# Patient Record
Sex: Female | Born: 1940 | Race: White | Hispanic: No | Smoking: Former smoker
Health system: Southern US, Community
[De-identification: ages and names within clinical notes are randomized; demographics above are authoritative.]

## PROBLEM LIST (undated history)

## (undated) DIAGNOSIS — I509 Heart failure, unspecified: Secondary | ICD-10-CM

## (undated) DIAGNOSIS — J449 Chronic obstructive pulmonary disease, unspecified: Secondary | ICD-10-CM

## (undated) DIAGNOSIS — E7800 Pure hypercholesterolemia, unspecified: Secondary | ICD-10-CM

## (undated) DIAGNOSIS — Z9109 Other allergy status, other than to drugs and biological substances: Secondary | ICD-10-CM

## (undated) DIAGNOSIS — E119 Type 2 diabetes mellitus without complications: Secondary | ICD-10-CM

## (undated) DIAGNOSIS — I10 Essential (primary) hypertension: Secondary | ICD-10-CM

## (undated) HISTORY — DX: Type 2 diabetes mellitus without complications: E11.9

## (undated) HISTORY — DX: Heart failure, unspecified: I50.9

## (undated) HISTORY — PX: BREAST LUMPECTOMY: SHX2

## (undated) HISTORY — DX: Pure hypercholesterolemia, unspecified: E78.00

## (undated) HISTORY — DX: Essential (primary) hypertension: I10

## (undated) HISTORY — DX: Other allergy status, other than to drugs and biological substances: Z91.09

---

## 1973-12-31 HISTORY — PX: VESICOVAGINAL FISTULA CLOSURE W/ TAH: SUR271

## 2005-10-09 ENCOUNTER — Ambulatory Visit: Payer: Self-pay | Admitting: Urology

## 2005-10-30 ENCOUNTER — Ambulatory Visit: Payer: Self-pay | Admitting: Internal Medicine

## 2006-11-07 ENCOUNTER — Ambulatory Visit: Payer: Self-pay | Admitting: Internal Medicine

## 2006-11-11 ENCOUNTER — Ambulatory Visit: Payer: Self-pay | Admitting: *Deleted

## 2006-11-19 ENCOUNTER — Ambulatory Visit: Payer: Self-pay | Admitting: *Deleted

## 2007-06-24 ENCOUNTER — Ambulatory Visit: Payer: Self-pay | Admitting: Internal Medicine

## 2007-12-30 ENCOUNTER — Ambulatory Visit: Payer: Self-pay | Admitting: Internal Medicine

## 2009-01-10 ENCOUNTER — Ambulatory Visit: Payer: Self-pay | Admitting: Internal Medicine

## 2010-02-20 ENCOUNTER — Ambulatory Visit: Payer: Self-pay | Admitting: Internal Medicine

## 2011-03-29 ENCOUNTER — Ambulatory Visit: Payer: Self-pay | Admitting: Internal Medicine

## 2012-04-25 ENCOUNTER — Ambulatory Visit: Payer: Self-pay | Admitting: Internal Medicine

## 2012-06-18 ENCOUNTER — Ambulatory Visit: Payer: Self-pay | Admitting: Ophthalmology

## 2012-07-02 ENCOUNTER — Ambulatory Visit: Payer: Self-pay | Admitting: Ophthalmology

## 2012-07-16 ENCOUNTER — Ambulatory Visit: Payer: Self-pay | Admitting: Ophthalmology

## 2013-04-27 ENCOUNTER — Ambulatory Visit: Payer: Self-pay | Admitting: Internal Medicine

## 2013-07-02 ENCOUNTER — Ambulatory Visit: Payer: Self-pay | Admitting: Medical

## 2013-07-02 LAB — URINALYSIS, COMPLETE
Bacteria: NEGATIVE
Blood: NEGATIVE
Glucose,UR: NEGATIVE mg/dL (ref 0–75)
Ketone: NEGATIVE
Leukocyte Esterase: NEGATIVE
Nitrite: NEGATIVE
Protein: NEGATIVE
Specific Gravity: 1.015 (ref 1.003–1.030)
Squamous Epithelial: NONE SEEN

## 2013-07-03 ENCOUNTER — Inpatient Hospital Stay: Payer: Self-pay | Admitting: Internal Medicine

## 2013-07-03 LAB — CBC WITH DIFFERENTIAL/PLATELET
Basophil #: 0.1 10*3/uL (ref 0.0–0.1)
Eosinophil %: 1.9 %
HGB: 15.6 g/dL (ref 12.0–16.0)
Lymphocyte #: 1.3 10*3/uL (ref 1.0–3.6)
Lymphocyte %: 8.9 %
Monocyte #: 1 x10 3/mm — ABNORMAL HIGH (ref 0.2–0.9)
Monocyte %: 7 %
Neutrophil %: 81.6 %
RBC: 5.27 10*6/uL — ABNORMAL HIGH (ref 3.80–5.20)
WBC: 14.1 10*3/uL — ABNORMAL HIGH (ref 3.6–11.0)

## 2013-07-03 LAB — COMPREHENSIVE METABOLIC PANEL
Alkaline Phosphatase: 110 U/L (ref 50–136)
Anion Gap: 5 — ABNORMAL LOW (ref 7–16)
Bilirubin,Total: 1.1 mg/dL — ABNORMAL HIGH (ref 0.2–1.0)
Chloride: 104 mmol/L (ref 98–107)
EGFR (African American): >60
EGFR (Non-African Amer.): >60
Glucose: 131 mg/dL — ABNORMAL HIGH (ref 65–99)
Potassium: 4 mmol/L (ref 3.5–5.1)
SGOT(AST): 15 U/L (ref 15–37)
SGPT (ALT): 22 U/L (ref 12–78)
Sodium: 138 mmol/L (ref 136–145)

## 2013-07-03 LAB — URINE CULTURE

## 2013-07-03 LAB — TROPONIN I: Troponin-I: <0.02 ng/mL

## 2013-07-04 LAB — BASIC METABOLIC PANEL
BUN: 14 mg/dL (ref 7–18)
Co2: 29 mmol/L (ref 21–32)
Creatinine: 0.87 mg/dL (ref 0.60–1.30)
EGFR (Non-African Amer.): >60
Glucose: 162 mg/dL — ABNORMAL HIGH (ref 65–99)
Potassium: 3.9 mmol/L (ref 3.5–5.1)

## 2013-07-04 LAB — CBC WITH DIFFERENTIAL/PLATELET
Basophil #: 0 10*3/uL (ref 0.0–0.1)
Basophil %: 0.1 %
HCT: 46.6 % (ref 35.0–47.0)
HGB: 15.6 g/dL (ref 12.0–16.0)
Lymphocyte #: 0.8 10*3/uL — ABNORMAL LOW (ref 1.0–3.6)
MCH: 30 pg (ref 26.0–34.0)
MCHC: 33.5 g/dL (ref 32.0–36.0)
MCV: 89 fL (ref 80–100)
Monocyte #: 0.1 x10 3/mm — ABNORMAL LOW (ref 0.2–0.9)
Monocyte %: 1.4 %
Neutrophil #: 9 10*3/uL — ABNORMAL HIGH (ref 1.4–6.5)
Platelet: 163 10*3/uL (ref 150–440)
RBC: 5.21 10*6/uL — ABNORMAL HIGH (ref 3.80–5.20)
RDW: 14.8 % — ABNORMAL HIGH (ref 11.5–14.5)
WBC: 9.9 10*3/uL (ref 3.6–11.0)

## 2013-07-05 LAB — CBC WITH DIFFERENTIAL/PLATELET
Basophil %: 0.2 %
Eosinophil #: 0 10*3/uL (ref 0.0–0.7)
Eosinophil %: 0 %
HGB: 15 g/dL (ref 12.0–16.0)
Lymphocyte #: 1.1 10*3/uL (ref 1.0–3.6)
Lymphocyte %: 4.7 %
MCH: 30.1 pg (ref 26.0–34.0)
MCV: 89 fL (ref 80–100)
Monocyte #: 0.9 x10 3/mm (ref 0.2–0.9)
Monocyte %: 4 %
Neutrophil #: 20.6 10*3/uL — ABNORMAL HIGH (ref 1.4–6.5)
Platelet: 167 10*3/uL (ref 150–440)
RBC: 4.99 10*6/uL (ref 3.80–5.20)
RDW: 15.1 % — ABNORMAL HIGH (ref 11.5–14.5)
WBC: 22.6 10*3/uL — ABNORMAL HIGH (ref 3.6–11.0)

## 2013-07-05 LAB — BASIC METABOLIC PANEL
Anion Gap: 6 — ABNORMAL LOW (ref 7–16)
BUN: 15 mg/dL (ref 7–18)
Calcium, Total: 9.7 mg/dL (ref 8.5–10.1)
EGFR (African American): >60
EGFR (Non-African Amer.): 58 — ABNORMAL LOW
Osmolality: 288 (ref 275–301)
Potassium: 4.2 mmol/L (ref 3.5–5.1)

## 2013-07-06 LAB — BASIC METABOLIC PANEL
Calcium, Total: 9.8 mg/dL (ref 8.5–10.1)
Chloride: 107 mmol/L (ref 98–107)
Co2: 33 mmol/L — ABNORMAL HIGH (ref 21–32)
Creatinine: 0.96 mg/dL (ref 0.60–1.30)
EGFR (African American): >60
Potassium: 4.2 mmol/L (ref 3.5–5.1)
Sodium: 143 mmol/L (ref 136–145)

## 2013-07-06 LAB — CBC WITH DIFFERENTIAL/PLATELET
Basophil #: 0 10*3/uL (ref 0.0–0.1)
Basophil %: 0.1 %
Eosinophil #: 0 10*3/uL (ref 0.0–0.7)
Eosinophil %: 0 %
HCT: 45.1 % (ref 35.0–47.0)
HGB: 15.1 g/dL (ref 12.0–16.0)
Lymphocyte #: 1.1 10*3/uL (ref 1.0–3.6)
Lymphocyte %: 6 %
Platelet: 166 10*3/uL (ref 150–440)
RBC: 5.04 10*6/uL (ref 3.80–5.20)
RDW: 15 % — ABNORMAL HIGH (ref 11.5–14.5)
WBC: 18.7 10*3/uL — ABNORMAL HIGH (ref 3.6–11.0)

## 2013-07-07 LAB — SEDIMENTATION RATE: Erythrocyte Sed Rate: 1 mm/hr (ref 0–30)

## 2013-07-08 LAB — CULTURE, BLOOD (SINGLE)

## 2013-07-16 LAB — PULMONARY FUNCTION TEST

## 2014-01-05 ENCOUNTER — Encounter: Payer: Self-pay | Admitting: Specialist

## 2014-01-31 ENCOUNTER — Encounter: Payer: Self-pay | Admitting: Specialist

## 2014-02-28 ENCOUNTER — Encounter: Payer: Self-pay | Admitting: Specialist

## 2014-03-24 ENCOUNTER — Encounter: Payer: Self-pay | Admitting: Pulmonary Disease

## 2014-03-24 ENCOUNTER — Ambulatory Visit (INDEPENDENT_AMBULATORY_CARE_PROVIDER_SITE_OTHER): Payer: Medicare Other | Admitting: Pulmonary Disease

## 2014-03-24 VITALS — BP 120/68 | HR 81 | Ht 64.0 in | Wt 172.0 lb

## 2014-03-24 DIAGNOSIS — J441 Chronic obstructive pulmonary disease with (acute) exacerbation: Secondary | ICD-10-CM

## 2014-03-24 DIAGNOSIS — J449 Chronic obstructive pulmonary disease, unspecified: Secondary | ICD-10-CM

## 2014-03-24 DIAGNOSIS — J9610 Chronic respiratory failure, unspecified whether with hypoxia or hypercapnia: Secondary | ICD-10-CM

## 2014-03-24 NOTE — Patient Instructions (Signed)
Try taking the Anoro once per day instead of your Spiriva and Advair; If you think it is better than your current medical regimen then call us so we can call in a prescription.  Otherwise resume the Spiriva and Advair after you complete the sample We will arrange a stress test at [facility] Continue pulmonary rehab We will see you back in 4-6 weeks or sooner if needed

## 2014-03-24 NOTE — Progress Notes (Signed)
Subjective:    Patient ID: [name], female    DOB: Oct 14, 1941, 73 y.o.   MRN: [id number]  HPI  CC "Make me breathe better!"  [name] is here to see me for advanced COPD.  Last July (2014) she was hospitalized for 6 days for acute respiratory failure and was discharged on oxygen.  She was told that she had stage IV COPD and has been compliant with office visits with Dr. Vella Kohler here in town.  She has been going to pulmonary rehab and has completed 20 of 36 rehab visits.   She really doesn't cough too often and she doesn't feel too much chest congestion.  She has been using her Spiriva and Advair regularly.  She has been on inhalers for about 5 years now.   Since her hospitalization she has been experiencing dyspnea with minimal activity.  She exercises regularly with pulmonary rehab, but she relies on her husband to do work around the house.  She still participates in some activities like cooking and minimal cleaning.  She doesn't have chest pain or leg swelling with exertion.   Past Medical History  Diagnosis Date  . High blood pressure   . High cholesterol   . Environmental allergies      Family History  Problem Relation Age of Onset  . Heart disease Father   . Heart disease Brother     2 brothers  . Rheum arthritis Mother   . Cancer Mother     breast  . Cancer Maternal Aunt     cervical  . Cancer Maternal Aunt     stomach     History   Social History  . Marital Status: Married    Spouse Name: N/A    Number of Children: N/A  . Years of Education: N/A   Occupational History  . Not on file.   Social History Main Topics  . Smoking status: Former Smoker -- 1.00 packs/day for 45 years    Types: Cigarettes    Quit date: 06/30/2013  . Smokeless tobacco: Never Used  . Alcohol Use: Yes     Comment: occasional glass of wine  . Drug Use: No  . Sexual Activity: Not on file   Other Topics Concern  . Not on file   Social History Narrative  . No narrative on  file     Allergies  Allergen Reactions  . Mucinex D [Pseudoephedrine-Guaifenesin Er]     Rash on neck     No outpatient prescriptions prior to visit.   No facility-administered medications prior to visit.       Review of Systems  Constitutional: Negative for fever and unexpected weight change.  HENT: Positive for sinus pressure. Negative for congestion, dental problem, ear pain, nosebleeds, postnasal drip, rhinorrhea, sneezing, sore throat and trouble swallowing.   Eyes: Negative for redness and itching.  Respiratory: Positive for chest tightness and shortness of breath. Negative for cough and wheezing.   Cardiovascular: Negative for palpitations and leg swelling.  Gastrointestinal: Negative for nausea and vomiting.  Genitourinary: Negative for dysuria.  Musculoskeletal: Negative for joint swelling.  Skin: Negative for rash.  Neurological: Negative for headaches.  Hematological: Does not bruise/bleed easily.  Psychiatric/Behavioral: Negative for dysphoric mood. The patient is not nervous/anxious.        Objective:   Physical Exam  Filed Vitals:   03/24/14 1610  BP: 120/68  Pulse: 81  Height: 5\' 4"  (1.626 m)  Weight: 172 lb (78.019 kg)  SpO2: 94%  2L [place]  Gen: well appearing, no acute distress HEENT: NCAT, PERRL, EOMi, OP clear, neck supple without masses PULM: CTA B CV: RRR, no mgr, no JVD AB: BS+, soft, nontender, no hsm Ext: warm, no edema, no clubbing, no cyanosis Derm: no rash or skin breakdown Neuro: A&Ox4, CN II-XII intact, strength 5/5 in all 4 extremities   06/2013 CT chest > upper lobe emphysema noted     Assessment & Plan:   COPD, severe Based on the fact that she was hospitalized in July for COPD she has GOLD Grade D COPD.  However, I haven't seen spirometry for her yet.  Her major limitation is shortness of breath and she has very little symptoms consistent with chronic bronchitis.  She has severe upper lobe emphysema on CT chest from July  2014.  Plan: -switch inhaler regimen to Anoro alone plus short acting for a trial -continue pulmonary rehab -if no improvement and depending on PFT results (I am requesting records), consider referral for Lung Volume Reduction Surgery  Chronic respiratory failure Continue 2L continuously    Updated Medication List Outpatient Encounter Prescriptions as of 03/24/2014  Medication Sig  . albuterol (PROVENTIL HFA;VENTOLIN HFA) 108 (90 BASE) MCG/ACT inhaler Inhale 2 puffs into the lungs every 6 (six) hours as needed for wheezing or shortness of breath.  Marland Kitchen atorvastatin (LIPITOR) 20 MG tablet Take 20 mg by mouth daily.  . carvedilol (COREG) 6.25 MG tablet Take 6.25 mg by mouth 2 (two) times daily with a meal.  . citalopram (CELEXA) 20 MG tablet Take 20 mg by mouth daily.  . Fluticasone-Salmeterol (ADVAIR) 250-50 MCG/DOSE AEPB Inhale 1 puff into the lungs 2 (two) times daily.  . potassium chloride (MICRO-K) 10 MEQ CR capsule Take 10 mEq by mouth daily.  . roflumilast (DALIRESP) 500 MCG TABS tablet Take 500 mcg by mouth daily.  Marland Kitchen tiotropium (SPIRIVA) 18 MCG inhalation capsule Place 18 mcg into inhaler and inhale daily.  Marland Kitchen torsemide (DEMADEX) 20 MG tablet Take 20 mg by mouth daily.

## 2014-03-25 DIAGNOSIS — J449 Chronic obstructive pulmonary disease, unspecified: Secondary | ICD-10-CM | POA: Insufficient documentation

## 2014-03-25 DIAGNOSIS — J9610 Chronic respiratory failure, unspecified whether with hypoxia or hypercapnia: Secondary | ICD-10-CM | POA: Insufficient documentation

## 2014-03-25 NOTE — Assessment & Plan Note (Signed)
Continue 2L continuously 

## 2014-03-25 NOTE — Assessment & Plan Note (Signed)
Based on the fact that she was hospitalized in July for COPD she has GOLD Grade D COPD.  However, I haven't seen spirometry for her yet.  Her major limitation is shortness of breath and she has very little symptoms consistent with chronic bronchitis.  She has severe upper lobe emphysema on CT chest from July 2014.  Plan: -switch inhaler regimen to Anoro alone plus short acting for a trial -continue pulmonary rehab -if no improvement and depending on PFT results (I am requesting records), consider referral for Lung Volume Reduction Surgery

## 2014-03-31 ENCOUNTER — Encounter: Payer: Self-pay | Admitting: Specialist

## 2014-04-05 ENCOUNTER — Other Ambulatory Visit: Payer: Self-pay

## 2014-04-05 ENCOUNTER — Telehealth: Payer: Self-pay | Admitting: Pulmonary Disease

## 2014-04-05 DIAGNOSIS — J9610 Chronic respiratory failure, unspecified whether with hypoxia or hypercapnia: Secondary | ICD-10-CM

## 2014-04-05 NOTE — Telephone Encounter (Signed)
Pamala Hurry from the pulmonary department called she needs to clarify the order on this patient. She also would like to ask a few more questions in regards to this patient.

## 2014-04-05 NOTE — Telephone Encounter (Signed)
ACT X3 line busy, will cb

## 2014-04-05 NOTE — Telephone Encounter (Signed)
Pamala Hurry w/ [facility] calling again.  Pt is there now - she is in a wheelchair with O2.  Pamala Hurry does not feel comfortable doing treadmill test.    Discussed with Caryl Pina, who asked BQ.  Per BQ: looking for cardiac ischemia; if pt is unable to perform treadmill test, can do drug-induced w/ Lexiscan.    Per Pamala Hurry, new order will need to be placed for [facility] and this is dosed by a pharmacy in [place] so the patient will need to be rescheduled.  Caryl Pina is aware and will change the order.  Pamala Hurry is asking that the patient be called this afternoon to discuss.  Will hold in triage to call pt this pm.

## 2014-04-05 NOTE — Telephone Encounter (Signed)
Spoke with the pt and notified of recs per BQ  She verbalized understanding  Nothing further needed 

## 2014-04-05 NOTE — Telephone Encounter (Signed)
Duplicate message See other 4.6.15 phone note

## 2014-04-21 ENCOUNTER — Ambulatory Visit: Payer: Self-pay | Admitting: Pulmonary Disease

## 2014-04-21 DIAGNOSIS — R0609 Other forms of dyspnea: Secondary | ICD-10-CM

## 2014-04-21 DIAGNOSIS — R0989 Other specified symptoms and signs involving the circulatory and respiratory systems: Secondary | ICD-10-CM

## 2014-04-26 ENCOUNTER — Encounter: Payer: Self-pay | Admitting: Pulmonary Disease

## 2014-04-26 ENCOUNTER — Telehealth: Payer: Self-pay

## 2014-04-26 DIAGNOSIS — R0600 Dyspnea, unspecified: Secondary | ICD-10-CM | POA: Insufficient documentation

## 2014-04-26 NOTE — Telephone Encounter (Signed)
Message copied by Len Blalock on Mon Apr 26, 2014 11:20 AM ------      Message from: Simonne Maffucci B      Created: Mon Apr 26, 2014  2:23 AM       A,            Please let her know that her nuclear stress test was normal            Thanks      B ------

## 2014-04-26 NOTE — Telephone Encounter (Signed)
Pt aware of results.  Nothing further needed.  

## 2014-04-26 NOTE — Telephone Encounter (Signed)
lmtcb X1 to relay results. 

## 2014-04-26 NOTE — Telephone Encounter (Signed)
Patient returning call.

## 2014-04-29 ENCOUNTER — Encounter: Payer: Self-pay | Admitting: Pulmonary Disease

## 2014-05-03 ENCOUNTER — Ambulatory Visit (INDEPENDENT_AMBULATORY_CARE_PROVIDER_SITE_OTHER): Payer: Medicare Other | Admitting: Pulmonary Disease

## 2014-05-03 ENCOUNTER — Encounter: Payer: Self-pay | Admitting: Pulmonary Disease

## 2014-05-03 VITALS — BP 118/64 | HR 71 | Ht 64.0 in | Wt 170.0 lb

## 2014-05-03 DIAGNOSIS — J449 Chronic obstructive pulmonary disease, unspecified: Secondary | ICD-10-CM

## 2014-05-03 DIAGNOSIS — J9610 Chronic respiratory failure, unspecified whether with hypoxia or hypercapnia: Secondary | ICD-10-CM

## 2014-05-03 NOTE — Progress Notes (Signed)
Subjective:    Patient ID: [name], female    DOB: 1941/02/17, 73 y.o.   MRN: [id number]  Synopsis:: Gold grade D. COPD on 2 L nasal cannula continuously. Completed pulmonary rehabilitation in April 2015  HPI  05/03/2014 ROV > since completing pulmonary rehabilitation Brecklynn has been feeling stronger but not necessarily any better in terms of shortness of breath. She continues to use her oxygen continuously and benefits from it. She tried taking the Anoro but said that it actually made her breathing worse so she switched back to the Advair and Spriva. She has not started exercising since completing pulmonary rehabilitation late last week. She does not have a cough. She did have a cold recently that had her coughing a little bit but this is resolved. She does not have chest pain. Last week she has stress test that was normal.   Past Medical History  Diagnosis Date  . High blood pressure   . High cholesterol   . Environmental allergies      Review of Systems     Objective:   Physical Exam Filed Vitals:   05/03/14 1433  BP: 118/64  Pulse: 71  Height: 5\' 4"  (1.626 m)  Weight: 170 lb (77.111 kg)  SpO2: 100%   2L [place]  Gen: well appearing, no acute distress HEENT: NCAT,  EOMi, OP clear, PULM: Crackles in bases, poor air movement CV: RRR, no mgr, no JVD AB: BS+, soft, nontender, no hsm Ext: warm, no edema, no clubbing, no cyanosis Derm: no rash or skin breakdown Neuro: A&Ox4, MAEW  July 2014 CT chest>  significant upper lobe emphysema, very mild interstitial thickening in the bases versus atelectasis, most predominant finding is emphysema overall   April 2015 Myoview stress test normal  2014 TTE > LVEF 60-65%, normal LV size and function, mild AR     Assessment & Plan:   COPD, severe She still has very significant dyspnea despite completing pulmonary rehabilitation, compliance with medicines, not smoking, and compliance with oxygen. She really has no other  concomitant problem such as heart failure or coronary artery disease. Her recent stress test was normal.  She may be a candidate for lung volume reduction surgery given the significant upper lobe emphysema.  Plan: -We will request records from her prior pulmonologist office again for pulmonary function testing -If we don't get them know this order another set of PFTs -I. will request a thoracic surgery evaluation at [facility] for lung volume reduction surgery if she meets criteria -In the meantime remain active -Continue medicines as written -Continue oxygen as written -Get a flu shot in the fall    Updated Medication List Outpatient Encounter Prescriptions as of 05/03/2014  Medication Sig  . albuterol (PROVENTIL HFA;VENTOLIN HFA) 108 (90 BASE) MCG/ACT inhaler Inhale 2 puffs into the lungs every 6 (six) hours as needed for wheezing or shortness of breath.  Marland Kitchen atorvastatin (LIPITOR) 20 MG tablet Take 20 mg by mouth daily.  . carvedilol (COREG) 6.25 MG tablet Take 6.25 mg by mouth 2 (two) times daily with a meal.  . citalopram (CELEXA) 20 MG tablet Take 20 mg by mouth daily.  . Fluticasone-Salmeterol (ADVAIR) 250-50 MCG/DOSE AEPB Inhale 1 puff into the lungs 2 (two) times daily.  . potassium chloride (MICRO-K) 10 MEQ CR capsule Take 10 mEq by mouth daily.  . roflumilast (DALIRESP) 500 MCG TABS tablet Take 500 mcg by mouth daily.  Marland Kitchen tiotropium (SPIRIVA) 18 MCG inhalation capsule Place 18 mcg into inhaler and inhale  daily.  . torsemide (DEMADEX) 20 MG tablet Take 20 mg by mouth daily.

## 2014-05-03 NOTE — Assessment & Plan Note (Signed)
Continue 2 L of oxygen continuously. 

## 2014-05-03 NOTE — Assessment & Plan Note (Signed)
She still has very significant dyspnea despite completing pulmonary rehabilitation, compliance with medicines, not smoking, and compliance with oxygen. She really has no other concomitant problem such as heart failure or coronary artery disease. Her recent stress test was normal.  She may be a candidate for lung volume reduction surgery given the significant upper lobe emphysema.  Plan: -We will request records from her prior pulmonologist office again for pulmonary function testing -If we don't get them know this order another set of PFTs -I. will request a thoracic surgery evaluation at [facility] for lung volume reduction surgery if she meets criteria -In the meantime remain active -Continue medicines as written -Continue oxygen as written -Get a flu shot in the fall

## 2014-05-03 NOTE — Patient Instructions (Signed)
We will call you when we get the results of the lung function testing to let you know if we should refer you to the thoracic surgery clinic at [facility] to consider lung volume reduction surgery Stay active We will see you back in 4-6 months or sooner if needed

## 2014-05-04 ENCOUNTER — Encounter: Payer: Self-pay | Admitting: Pulmonary Disease

## 2014-05-21 ENCOUNTER — Encounter: Payer: Self-pay | Admitting: Pulmonary Disease

## 2014-05-25 ENCOUNTER — Encounter: Payer: Self-pay | Admitting: Pulmonary Disease

## 2014-05-29 DIAGNOSIS — E785 Hyperlipidemia, unspecified: Secondary | ICD-10-CM

## 2014-05-29 DIAGNOSIS — R32 Unspecified urinary incontinence: Secondary | ICD-10-CM | POA: Insufficient documentation

## 2014-05-29 DIAGNOSIS — I10 Essential (primary) hypertension: Secondary | ICD-10-CM | POA: Insufficient documentation

## 2014-05-29 DIAGNOSIS — R609 Edema, unspecified: Secondary | ICD-10-CM | POA: Insufficient documentation

## 2014-05-29 DIAGNOSIS — E1169 Type 2 diabetes mellitus with other specified complication: Secondary | ICD-10-CM | POA: Insufficient documentation

## 2014-06-08 DIAGNOSIS — E1122 Type 2 diabetes mellitus with diabetic chronic kidney disease: Secondary | ICD-10-CM | POA: Insufficient documentation

## 2014-06-08 DIAGNOSIS — N183 Chronic kidney disease, stage 3 unspecified: Secondary | ICD-10-CM | POA: Insufficient documentation

## 2014-07-07 ENCOUNTER — Ambulatory Visit: Payer: Self-pay | Admitting: Internal Medicine

## 2014-08-02 ENCOUNTER — Telehealth: Payer: Self-pay | Admitting: Pulmonary Disease

## 2014-08-02 MED ORDER — FLUTICASONE-SALMETEROL 250-50 MCG/DOSE IN AEPB: 1.0000 | INHALATION_SPRAY | Freq: Two times a day (BID) | RESPIRATORY_TRACT | Status: DC

## 2014-08-02 MED ORDER — TIOTROPIUM BROMIDE MONOHYDRATE 18 MCG IN CAPS: 18.0000 ug | ORAL_CAPSULE | Freq: Every day | RESPIRATORY_TRACT | Status: DC

## 2014-08-02 NOTE — Telephone Encounter (Signed)
Pt aware rx's have been called in. Nothing further needed

## 2014-09-20 ENCOUNTER — Telehealth: Payer: Self-pay | Admitting: Pulmonary Disease

## 2014-09-20 NOTE — Telephone Encounter (Signed)
I spoke with the pt and she wanted to know what flu shot we have available. I advised it is not the high dose she was asking about. Pt states understanding. Nothing further needed. Major Bing, CMA

## 2014-10-10 DIAGNOSIS — F325 Major depressive disorder, single episode, in full remission: Secondary | ICD-10-CM | POA: Insufficient documentation

## 2014-10-13 ENCOUNTER — Encounter: Payer: Self-pay | Admitting: Pulmonary Disease

## 2014-10-13 ENCOUNTER — Ambulatory Visit (INDEPENDENT_AMBULATORY_CARE_PROVIDER_SITE_OTHER): Payer: Medicare Other | Admitting: Pulmonary Disease

## 2014-10-13 VITALS — BP 120/64 | HR 74 | Ht 64.0 in | Wt 176.0 lb

## 2014-10-13 DIAGNOSIS — J9611 Chronic respiratory failure with hypoxia: Secondary | ICD-10-CM

## 2014-10-13 DIAGNOSIS — Z23 Encounter for immunization: Secondary | ICD-10-CM

## 2014-10-13 DIAGNOSIS — R5383 Other fatigue: Secondary | ICD-10-CM | POA: Insufficient documentation

## 2014-10-13 DIAGNOSIS — J449 Chronic obstructive pulmonary disease, unspecified: Secondary | ICD-10-CM

## 2014-10-13 LAB — CBC
HEMATOCRIT: 34.8 % — AB (ref 36.0–46.0)
Hemoglobin: 11.3 g/dL — ABNORMAL LOW (ref 12.0–15.0)
MCHC: 32.6 g/dL (ref 30.0–36.0)
MCV: 90.3 fl (ref 78.0–100.0)
Platelets: 174 10*3/uL (ref 150.0–400.0)
RBC: 3.86 Mil/uL — AB (ref 3.87–5.11)
RDW: 13.9 % (ref 11.5–15.5)
WBC: 7.8 10*3/uL (ref 4.0–10.5)

## 2014-10-13 LAB — TSH: TSH: 3.77 u[IU]/mL (ref 0.35–4.50)

## 2014-10-13 NOTE — Assessment & Plan Note (Signed)
Continue 2 L of oxygen continuously. 

## 2014-10-13 NOTE — Assessment & Plan Note (Signed)
She has severe COPD but this has been a stable interval. Most of her complaints today revolve around fatigue and not frank shortness of breath.  Plan: -Flu shot -Continue Advair and Spiriva -Followup 4-6 months

## 2014-10-13 NOTE — Progress Notes (Signed)
Subjective:    Patient ID: [name], female    DOB: [birth year], 73 y.o.   MRN: [id number]  Synopsis:: Gold grade D. COPD on 2 L nasal cannula continuously. Completed pulmonary rehabilitation in April 2015  HPI  Chief Complaint  Patient presents with  . Follow-up    Pt c.o SOB with exertion, prod cough with white mucus in mornings. CAT score 24.   10/13/2014 ROV > [name] says she has been doing OK lately, but thinks that her dyspnea is worse after pulmonary rehab.  She has a lot of dyspnea with activities around the house like cooking.  She can climb stairs but she has to stop 1/2 way up.  She feels liek this is worse since the last visit.  She can do her ADLs but she has to stop to rest frequently.  There is both fatigue and dyspnea.  She has back pain that contributes to her symptoms as well. No bronchitis or exacerbations since last visit. No flu shot.   She doesn't sleep well.  Often has things on her mind that keep her from falling asleep.  Her husband has witnessed apneas very rarely, no snoring.  No morning headaches.  She drinks "a lot" of diet pepsi a day and has 4+ glasses a day.  She drinks it at night.  No alcohol.  No sleep aids.   Past Medical History  Diagnosis Date  . High blood pressure   . High cholesterol   . Environmental allergies      Review of Systems  Constitutional: Positive for fatigue. Negative for fever and chills.  HENT: Negative for postnasal drip, rhinorrhea and sinus pressure.   Respiratory: Positive for shortness of breath. Negative for cough and wheezing.   Cardiovascular: Negative for chest pain, palpitations and leg swelling.       Objective:   Physical Exam  Filed Vitals:   10/13/14 1053  BP: 120/64  Pulse: 74  Height: 5\' 4"  (1.626 m)  Weight: 176 lb (79.833 kg)  SpO2: 95%   2L [place]  Gen: well appearing, no acute distress HEENT: NCAT,  EOMi, OP clear, PULM: Crackles in bases, poor air movement CV: RRR, no mgr, no JVD AB:  BS+, soft, nontender,  Ext: warm, no edema, no clubbing, no cyanosis Derm: no rash or skin breakdown Neuro: A&Ox4, MAEW  July 2014 CT chest>  significant upper lobe emphysema, very mild interstitial thickening in the bases versus atelectasis, most predominant finding is emphysema overall   April 2015 Myoview stress test normal  2014 TTE > LVEF 60-65%, normal LV size and function, mild AR     Assessment & Plan:   COPD, severe She has severe COPD but this has been a stable interval. Most of her complaints today revolve around fatigue and not frank shortness of breath.  Plan: -Flu shot -Continue Advair and Spiriva -Followup 4-6 months  Chronic respiratory failure Continue 2 L of oxygen continuously  Fatigue She complains of chronic fatigue and poor sleeping. Her husband thinks on occasional he may have noticed her stop breathing. However, she does not snore heavily nor does she naps frequently during the daytime. She does have very poor sleep hygiene and this was reviewed today in clinic. I advised that she stop drinking so much caffeine.  I explained to her that the differential diagnosis of fatigue is broad and includes hematologic and endocrine conditions in addition to sleep apnea. There is a very high incidence of coexistent obstructive sleep apnea in  patients with severe COPD.  Plan: -Obtain sleep study - CBC and TSH -If the above workup is negative then followup with primary care physician for further evaluation    Updated Medication List Outpatient Encounter Prescriptions as of 10/13/2014  Medication Sig  . albuterol (PROVENTIL HFA;VENTOLIN HFA) 108 (90 BASE) MCG/ACT inhaler Inhale 2 puffs into the lungs every 6 (six) hours as needed for wheezing or shortness of breath.  Marland Kitchen atorvastatin (LIPITOR) 20 MG tablet Take 20 mg by mouth daily.  . carvedilol (COREG) 6.25 MG tablet Take 6.25 mg by mouth 2 (two) times daily with a meal.  . citalopram (CELEXA) 20 MG tablet Take 20  mg by mouth daily.  . Fluticasone-Salmeterol (ADVAIR) 250-50 MCG/DOSE AEPB Inhale 1 puff into the lungs 2 (two) times daily.  . potassium chloride (MICRO-K) 10 MEQ CR capsule Take 10 mEq by mouth daily.  . roflumilast (DALIRESP) 500 MCG TABS tablet Take 500 mcg by mouth daily.  Marland Kitchen tiotropium (SPIRIVA) 18 MCG inhalation capsule Place 1 capsule (18 mcg total) into inhaler and inhale daily.  Marland Kitchen torsemide (DEMADEX) 20 MG tablet Take 20 mg by mouth daily.

## 2014-10-13 NOTE — Patient Instructions (Signed)
We will arrange a sleep study here in town for you Keep taking your medicines as you are doing If the lab work and sleep study are normal, talk to your PCP about the fatigue We will see you back in 3-4 months or sooner if needed

## 2014-10-13 NOTE — Assessment & Plan Note (Signed)
She complains of chronic fatigue and poor sleeping. Her husband thinks on occasional he may have noticed her stop breathing. However, she does not snore heavily nor does she naps frequently during the daytime. She does have very poor sleep hygiene and this was reviewed today in clinic. I advised that she stop drinking so much caffeine.  I explained to her that the differential diagnosis of fatigue is broad and includes hematologic and endocrine conditions in addition to sleep apnea. There is a very high incidence of coexistent obstructive sleep apnea in patients with severe COPD.  Plan: -Obtain sleep study - CBC and TSH -If the above workup is negative then followup with primary care physician for further evaluation

## 2014-10-14 ENCOUNTER — Telehealth: Payer: Self-pay | Admitting: Pulmonary Disease

## 2014-10-14 NOTE — Telephone Encounter (Signed)
I tried to call [name] today to let her know that her labwork showed anemia and normal thyroid function.  The anemia is highly unusual considering her oxygen dependent COPD.  However I could not reach her and could not leave a message.  Will route this message to triage to contact her and let her know that she needs to see her PCP to discuss the anemia further.

## 2014-10-27 ENCOUNTER — Ambulatory Visit: Payer: Self-pay | Admitting: Pulmonary Disease

## 2014-11-22 ENCOUNTER — Telehealth: Payer: Self-pay

## 2014-11-22 DIAGNOSIS — R5383 Other fatigue: Secondary | ICD-10-CM

## 2014-11-22 NOTE — Telephone Encounter (Signed)
-----   Message from Juanito Doom, MD sent at 11/22/2014  2:18 AM EST ----- A, Please let her know that her sleep study did not show sleep apnea. It did show period limb movement and she should have a ferritin lab checked soon.  Please order this prior to her next visit with me. Thanks B

## 2014-11-22 NOTE — Telephone Encounter (Signed)
Spoke with pt, she is aware of results and recs.  Will have ferritin checked at [place] office on 12/02/14.  Also, pt was wanting to know the results of her TSH and CBC from last visit. Dr. Lake Bells please advise.  Thank you.

## 2014-11-23 NOTE — Telephone Encounter (Signed)
Please see my telephone note earlier this month.  She has anemia which needs to be addressed by her PCP.

## 2014-11-23 NOTE — Telephone Encounter (Signed)
Spoke with pt and advised of lab results per Dr Lake Bells.  She will contact her PCP regarding anemia f/u.

## 2014-12-02 ENCOUNTER — Other Ambulatory Visit (INDEPENDENT_AMBULATORY_CARE_PROVIDER_SITE_OTHER): Payer: Medicare Other

## 2014-12-02 DIAGNOSIS — R5383 Other fatigue: Secondary | ICD-10-CM

## 2014-12-02 LAB — FERRITIN: Ferritin: 69.9 ng/mL (ref 10.0–291.0)

## 2014-12-06 NOTE — Progress Notes (Signed)
Quick Note:  Pt aware of results. ______ 

## 2015-01-04 ENCOUNTER — Telehealth: Payer: Self-pay | Admitting: Pulmonary Disease

## 2015-01-04 MED ORDER — ROFLUMILAST 500 MCG PO TABS: 500.0000 ug | ORAL_TABLET | Freq: Every day | ORAL | Status: DC

## 2015-01-04 MED ORDER — FLUTICASONE-SALMETEROL 250-50 MCG/DOSE IN AEPB: 1.0000 | INHALATION_SPRAY | Freq: Two times a day (BID) | RESPIRATORY_TRACT | Status: DC

## 2015-01-04 MED ORDER — ALBUTEROL SULFATE HFA 108 (90 BASE) MCG/ACT IN AERS: 2.0000 | INHALATION_SPRAY | Freq: Four times a day (QID) | RESPIRATORY_TRACT | Status: DC | PRN

## 2015-01-04 MED ORDER — TIOTROPIUM BROMIDE MONOHYDRATE 18 MCG IN CAPS: 18.0000 ug | ORAL_CAPSULE | Freq: Every day | RESPIRATORY_TRACT | Status: DC

## 2015-01-04 NOTE — Telephone Encounter (Signed)
Pt new mail order is [facility].  Requests that meds be sent through here. Requests refills of: Advair, Daliresp, Spiriva and Albuterol.  Meds have been sent as 90-supply. Nothing further needed.

## 2015-02-02 ENCOUNTER — Ambulatory Visit (INDEPENDENT_AMBULATORY_CARE_PROVIDER_SITE_OTHER): Payer: PPO | Admitting: Pulmonary Disease

## 2015-02-02 ENCOUNTER — Encounter: Payer: Self-pay | Admitting: Pulmonary Disease

## 2015-02-02 VITALS — BP 120/68 | HR 68 | Temp 98.0°F | Ht 64.0 in | Wt 178.0 lb

## 2015-02-02 DIAGNOSIS — J449 Chronic obstructive pulmonary disease, unspecified: Secondary | ICD-10-CM

## 2015-02-02 DIAGNOSIS — J9611 Chronic respiratory failure with hypoxia: Secondary | ICD-10-CM

## 2015-02-02 NOTE — Assessment & Plan Note (Signed)
Continue 2 L of oxygen continuously. 

## 2015-02-02 NOTE — Progress Notes (Signed)
Subjective:    Patient ID: [name], female    DOB: [birth year], 74 y.o.   MRN: [id number]  Synopsis:: Gold grade D. COPD on 2 L nasal cannula continuously. Completed pulmonary rehabilitation in April 2015  HPI Chief Complaint  Patient presents with  . Follow-up    3 mos f/u COPD, fatigue. Pt still has sob with exhertion, fatigue unchanged. Pt not able to ambulate far. Pt having hoarseness at times. Pt denies coug,wheezing . She has minimal chest tightness.   Avanti had a good Christmas and New Years this year with her family.  She says that things seem OK but seh still has a lot of dyspnea with any exertion.  She says that she can't walk for long periods of time.  She has not had much problems with mucus in her throat. She is trying to stay active and still goes to the grocery store.  She has not been exercising otherwise though, primarily due to pain   Past Medical History  Diagnosis Date  . High blood pressure   . High cholesterol   . Environmental allergies      Review of Systems  Constitutional: Positive for fatigue. Negative for fever and chills.  HENT: Negative for postnasal drip, rhinorrhea and sinus pressure.   Respiratory: Positive for shortness of breath. Negative for cough and wheezing.   Cardiovascular: Negative for chest pain, palpitations and leg swelling.       Objective:   Physical Exam Filed Vitals:   02/02/15 1110  BP: 120/68  Pulse: 68  Temp: 98 F (36.7 C)  TempSrc: Oral  Height: 5\' 4"  (1.626 m)  Weight: 178 lb (80.74 kg)  SpO2: 96%   2L [place]  Gen: well appearing, no acute distress HEENT: NCAT,  EOMi, OP clear, PULM: Crackles in bases, poor air movement CV: RRR, no mgr, no JVD AB: BS+, soft, nontender,  Ext: warm, no edema, no clubbing, no cyanosis Derm: no rash or skin breakdown Neuro: A&Ox4, MAEW  July 2014 CT chest>  significant upper lobe emphysema, very mild interstitial thickening in the bases versus atelectasis, most predominant  finding is emphysema overall   April 2015 Myoview stress test normal  2014 TTE > LVEF 60-65%, normal LV size and function, mild AR     Assessment & Plan:   COPD, severe Vonna has severe, refractory's dyspnea secondary to her severe COPD. Fortunately, she has not had an exacerbation in the last several months. I explained to her today that the most important thing she can do is to continue to take her medications regularly, and try to stay active well avoiding deconditioning. She has not been exercising regularly recently.  Aside from her severe, chronic disease she has not had an acute exacerbation since the last visit which is good.  Plan: -Continue inhaled therapies as written -Continue oxygen regularly -I encouraged regular exercise and gave specific examples today in clinic -Follow-up 3 months   Chronic respiratory failure Continue 2 L of oxygen continuously     Updated Medication List Outpatient Encounter Prescriptions as of 02/02/2015  Medication Sig  . albuterol (PROVENTIL HFA;VENTOLIN HFA) 108 (90 BASE) MCG/ACT inhaler Inhale 2 puffs into the lungs every 6 (six) hours as needed for wheezing or shortness of breath.  Marland Kitchen atorvastatin (LIPITOR) 20 MG tablet Take 20 mg by mouth daily.  . carvedilol (COREG) 6.25 MG tablet Take 6.25 mg by mouth 2 (two) times daily with a meal.  . citalopram (CELEXA) 20 MG tablet Take  20 mg by mouth daily.  . Fluticasone-Salmeterol (ADVAIR) 250-50 MCG/DOSE AEPB Inhale 1 puff into the lungs 2 (two) times daily.  . potassium chloride (MICRO-K) 10 MEQ CR capsule Take 10 mEq by mouth daily.  . roflumilast (DALIRESP) 500 MCG TABS tablet Take 1 tablet (500 mcg total) by mouth daily.  Marland Kitchen tiotropium (SPIRIVA) 18 MCG inhalation capsule Place 1 capsule (18 mcg total) into inhaler and inhale daily.  Marland Kitchen torsemide (DEMADEX) 20 MG tablet Take 20 mg by mouth daily.

## 2015-02-02 NOTE — Patient Instructions (Signed)
Exercise regularly by walking 5-10 minutes daily and doing sit to stand exercises.  Consider buying an exercise band to strengthen your arms.  We will apply for financial assistance for your Spiriva, Advair, and Daliresp  We will see you back in 3 months or sooner if needed

## 2015-02-02 NOTE — Assessment & Plan Note (Signed)
[name] has severe, refractory's dyspnea secondary to her severe COPD. Fortunately, she has not had an exacerbation in the last several months. I explained to her today that the most important thing she can do is to continue to take her medications regularly, and try to stay active well avoiding deconditioning. She has not been exercising regularly recently.  Aside from her severe, chronic disease she has not had an acute exacerbation since the last visit which is good.  Plan: -Continue inhaled therapies as written -Continue oxygen regularly -I encouraged regular exercise and gave specific examples today in clinic -Follow-up 3 months

## 2015-02-07 ENCOUNTER — Telehealth: Payer: Self-pay | Admitting: Pulmonary Disease

## 2015-02-07 NOTE — Telephone Encounter (Signed)
Spoke with patient-states she called [employer] for Advair assistance and was told her income for her household size was too much and needs to know what to do from here as she will not be able to afford the Rx.   Pt will go ahead and fill out ALL pt assistance forms for all 3 meds listed so just in case she was told wrong about income limit for [employer]. Will bring papers by for [facility] and [facility] once completed. PT aware in the meantime I will send message to [facility] to advise on in case she can not get med assistance.   Thanks.

## 2015-02-08 NOTE — Telephone Encounter (Signed)
Spoke with pt, she will bring her formulary with her [employer] papers when she has filled them out.  Nothing further needed at this time.

## 2015-02-08 NOTE — Telephone Encounter (Signed)
She needs to tell us what medication like advair is on her insurance formulary

## 2015-04-20 ENCOUNTER — Encounter: Payer: Self-pay | Admitting: Pulmonary Disease

## 2015-04-22 NOTE — Discharge Summary (Signed)
PATIENT NAME:  [name], [name] MR#:  [id number] DATE OF BIRTH:  1941/02/03  DATE OF ADMISSION:  07/03/2013 DATE OF DISCHARGE:  07/09/2013  DISCHARGE DIAGNOSES: 1.  Acute on chronic respiratory failure.  2.  Pulmonary fibrosis.  3.  Chronic obstructive pulmonary disease exacerbation.   DISCHARGE MEDICATIONS: Per [facility] medication reconciliation. Notably on prednisone 60 mg a day until seen by Dr. Raul Del, and oxygen 4 liters nasal cannula.   HISTORY AND PHYSICAL: Please see detailed history and physical done on admission.   HOSPITAL COURSE: The patient was admitted hypoxic, short of breath, wheezing, per report. Wheezing had stopped. She remained hypoxic. CT of her chest was done, which showed pulmonary fibrosis.  Pulmonary was consulted. Further evaluation was done by Dr. Raul Del, and pneumonitis hypersensitivity panel is still pending. Sed rate was normal at 1. Angiotensin converting enzyme was normal at 41. ANA was positive. ANA anti-DNA was less than 1. Anti-chromatin antibody was high. Full panel results are still pending. Plan will be to have  pulmonary function tests done going forward with Dr. Raul Del, possible corticosteroid sparing agent once that is done, per Dr. Raul Del, and he also has plans for possible referral to academic center. Again, CT scan showed fibrotic changes in the lung parenchyma. We will have home health for further physical therapy. She is walking less 100 feet, still short of breath with exertion. Will have nursing as well, given her tenuous status. I will see her in a week or 2 myself. She knows to call with any further trouble. Filled out home health forms evaluation, prescriptions, etc. It took approximately 35 minutes to do all discharge tasks today.  ____________________________ Ocie Cornfield. Ouida Sills, MD mwa:dmm D: 07/09/2013 12:32:29 ET T: 07/09/2013 12:39:22 ET JOB#: [id number]  cc: Ocie Cornfield. Ouida Sills, MD, <Dictator> [name] MD ELECTRONICALLY SIGNED  07/10/2013 6:49

## 2015-04-22 NOTE — H&P (Signed)
PATIENT NAME:  [name], FETTES MR#:  [id number] DATE OF BIRTH:  1941-11-09  DATE OF ADMISSION:  07/03/2013  PRIMARY CARE PHYSICIAN:  Frazier Richards  CHIEF COMPLAINT:  Shortness of breath, cough.   HISTORY OF PRESENT ILLNESS: A 74 year old female patient with history of COPD, hypertension, hyperlipidemia, presents to the Emergency Room complaining of shortness of breath that started earlier today. The patient was seen at urgent care yesterday for some low back pain, was diagnosed with UTI, started on Macrobid. The patient initially thought she was allergic to the medication with the shortness of breath. Here in the Emergency Room, her chest x-ray has shown right middle/lower lobe pneumonia. The patient is on 4 to 5 liters of oxygen, saturating 92%, is being admitted to the hospitalist service for acute respiratory failure secondary to pneumonia. The patient has had clear productive sputum, although minimal. Does not complain of any chest pain. Has not had any tachycardia. No risk factors for DVT. No recent long trips. No active cancers. No prior history of DVT/PE.   She does not have any nausea, vomiting, abdominal pain, lower extremity swelling, PND, orthopnea. The patient was initially thought to be possible to discharge home from ER, but on removing her oxygen she desaturated into the low 80s.   PAST MEDICAL HISTORY: 1.  COPD, not on home oxygen.  2.  Hypertension.  3.  Hyperlipidemia.   PAST SURGICAL HISTORY:  None.   SOCIAL HISTORY:  The patient lives with her husband. Ambulates on her own. Smokes a pack a day. Does not drink alcohol. No illicit drugs.   ALLERGIES:  No known drug allergies.   FAMILY HISTORY:  Mother had coronary artery disease.   REVIEW OF SYSTEMS:  CONSTITUTIONAL:  Complains of fatigue, weakness. No weight loss, weight gain.  EYES:  No blurred vision, pain, redness.  EARS, NOSE, THROAT:  No tinnitus, ear pain, hearing loss.  RESPIRATORY:  Has cough productive.   No wheezing, shortness of breath. Has COPD.   CARDIOVASCULAR:  No chest pain, orthopnea, edema.  GASTROINTESTINAL:  No nausea, vomiting, diarrhea, abdominal pain.  GENITOURINARY:  No dysuria, hematuria, frequency.  ENDOCRINE:  No polyuria, nocturia, thyroid problems.  HEMATOLOGIC AND LYMPHATIC:  No anemia, easy bruising, bleeding.  INTEGUMENTARY:  No acne, rash, lesions.  MUSCULOSKELETAL: Had some back pain yesterday, which has resolved. No arthritis.  NEUROLOGIC:  No focal numbness, weakness, dysarthria or seizures.  PSYCHIATRIC:  No anxiety or depression.   PHYSICAL EXAMINATION: VITAL SIGNS: Temperature 98.3, pulse of 82, respirations 26, blood pressure 145/86, saturating 92% on 4 liters oxygen.  GENERAL:  Obese Caucasian female patient sitting up in the bed in respiratory distress.  PSYCHIATRIC:  Alert and oriented x 3. Mood and affect appropriate. Judgment intact.  HEENT: Atraumatic, normocephalic. Oral mucosa moist and pink. External ears and nose normal.   Pupils bilaterally equal and react to light.  NECK:  Supple. No thyromegaly. No palpable lymph nodes. Trachea midline. No carotid bruit, JVD.  CARDIOVASCULAR:  S1, S2, without any murmurs. No edema. Peripheral pulses 2+.  RESPIRATORY:  Increased work of breathing, with conversational dyspnea. Decreased air entry on both sides. No wheezing.  GASTROINTESTINAL: Soft abdomen, nontender. Bowel sounds are present. No hepatosplenomegaly palpable.  SKIN:  Warm and dry. No petechiae, rash, ulcers.  GENITOURINARY:  No CVA tenderness or bladder distention.  MUSCULOSKELETAL:  No joint swelling, redness, effusion of the large joints. Normal muscle tone.  NEUROLOGICAL:  Motor strength 5/5 in upper and lower extremities. Sensation is  intact all over.   LABORATORIES: Show BNP of 354, glucose 131, BUN 10, creatinine 0.75, sodium 138, potassium 4, chloride 104. AST, ALT, alkaline phosphatase, bilirubin normal. Troponin less than 0.02. WBC 14.1,  hemoglobin 15.6, platelets of 168. Urinalysis from 07/02/2013 showed no bacteria.   EKG shows normal sinus rhythm with biatrial enlargement, no ST elevation.   Chest x-ray shows hazy right middle lobe airspace disease, which may represent atelectasis versus pneumonia.   ASSESSMENT AND PLAN: 1.  Acute respiratory failure secondary to right middle/lower lobe pneumonia. The patient does not have any significant wheezing, but is not moving much air, is on 5 liters oxygen at this time. Has conversational dyspnea. Will be admitted to the hospitalist service. Will start her on ceftriaxone, azithromycin, along with nebulizers, steroids and oxygen. The patient does have elevated BNP, although she does not have any crackles on exam. No edema. Does not seem fluid overloaded. Will get a 2-D echocardiogram. She could have elevated BNP secondary to the acute illness. Echo needs to be followed. Slowly wean down oxygen. Get blood cultures and sputum cultures.   2.  Hypertension, well controlled. Continue medications.   3.  Elevated BNP. Get a 2-D echocardiogram. The patient does not seem fluid overloaded.  4.  Sepsis secondary to pneumonia.   5.  Deep vein thrombosis prophylaxis with Lovenox.   6. Tobacco abuse- Pt was counselled for > 3 minutes to quit soking and explained effects on Lungs, heart, recurrent infections.  CODE STATUS:  FULL CODE.   Time spent today on this case was 50 minutes.   ____________________________ Leia Alf Jenilyn Magana, MD srs:mr D: 07/03/2013 20:37:00 ET T: 07/03/2013 20:51:58 ET JOB#: [id number]  cc: Ocie Cornfield. Ouida Sills, MD [place]. Shakerra Red, MD, <Dictator>   Neita Carp MD ELECTRONICALLY SIGNED 07/04/2013 13:26

## 2015-04-25 ENCOUNTER — Telehealth: Payer: Self-pay

## 2015-04-25 MED ORDER — ROFLUMILAST 500 MCG PO TABS: 500.0000 ug | ORAL_TABLET | Freq: Every day | ORAL | Status: DC

## 2015-04-25 MED ORDER — TIOTROPIUM BROMIDE MONOHYDRATE 18 MCG IN CAPS: 18.0000 ug | ORAL_CAPSULE | Freq: Every day | RESPIRATORY_TRACT | Status: DC

## 2015-04-25 MED ORDER — FLUTICASONE-SALMETEROL 250-50 MCG/DOSE IN AEPB: 1.0000 | INHALATION_SPRAY | Freq: Two times a day (BID) | RESPIRATORY_TRACT | Status: DC

## 2015-04-25 NOTE — Telephone Encounter (Signed)
lmtcb X1 for pt.  Pt assistance forms have been completed but a form is still missing a patient signature.  Calling to verify home address before I send these forms back to pt for completion.

## 2015-04-28 NOTE — Telephone Encounter (Signed)
Spoke with pt, verified address.  Pt assistance forms placed in mail.  Nothing further needed.

## 2015-05-12 ENCOUNTER — Other Ambulatory Visit: Payer: Self-pay | Admitting: *Deleted

## 2015-05-12 ENCOUNTER — Ambulatory Visit
Admission: RE | Admit: 2015-05-12 | Discharge: 2015-05-12 | Disposition: A | Discharge status: Home / Self Care | Payer: PPO | Source: Ambulatory Visit | Attending: Pulmonary Disease | Admitting: Pulmonary Disease

## 2015-05-12 ENCOUNTER — Encounter: Payer: Self-pay | Admitting: Pulmonary Disease

## 2015-05-12 ENCOUNTER — Ambulatory Visit (INDEPENDENT_AMBULATORY_CARE_PROVIDER_SITE_OTHER): Payer: PPO | Admitting: Pulmonary Disease

## 2015-05-12 VITALS — BP 100/62 | HR 68 | Temp 98.1°F | Ht 64.0 in | Wt 175.0 lb

## 2015-05-12 DIAGNOSIS — M5440 Lumbago with sciatica, unspecified side: Secondary | ICD-10-CM

## 2015-05-12 DIAGNOSIS — J449 Chronic obstructive pulmonary disease, unspecified: Secondary | ICD-10-CM | POA: Diagnosis not present

## 2015-05-12 DIAGNOSIS — J9611 Chronic respiratory failure with hypoxia: Secondary | ICD-10-CM | POA: Diagnosis not present

## 2015-05-12 DIAGNOSIS — M545 Low back pain: Secondary | ICD-10-CM | POA: Diagnosis present

## 2015-05-12 DIAGNOSIS — M549 Dorsalgia, unspecified: Secondary | ICD-10-CM | POA: Insufficient documentation

## 2015-05-12 NOTE — Addendum Note (Signed)
Addended by: Devona Konig on: 05/12/2015 11:30 AM   Modules accepted: Orders

## 2015-05-12 NOTE — Progress Notes (Signed)
Subjective:    Patient ID: SARINITY DICICCO, female    DOB: May 18, 1941, 74 y.o.   MRN: [id number]  Synopsis:: Gold grade D. COPD on 2 L nasal cannula continuously. Completed pulmonary rehabilitation in April 2015  HPI Chief Complaint  Patient presents with  . Follow-up    Pt reports breathing unchanged she still has sob upon exertion, fatigue. She is on 2L 02 continous. She has a new concern with weakness in back and legs.   Samara says that her breahting has not changed since the last visit. Still taking the Spiriva and Advair.  No prednisone since last visit.  No cough  She has been to a rheumatologist and told that she has some arthritis.  She is not taking any new medications for it other than aleve. The pain radiates to her legs, she has no weakness.   Past Medical History  Diagnosis Date  . High blood pressure   . High cholesterol   . Environmental allergies      Review of Systems  Constitutional: Positive for fatigue. Negative for fever and chills.  HENT: Negative for postnasal drip, rhinorrhea and sinus pressure.   Respiratory: Positive for shortness of breath. Negative for cough and wheezing.   Cardiovascular: Negative for chest pain, palpitations and leg swelling.  Musculoskeletal: Positive for back pain.       Objective:   Physical Exam Filed Vitals:   05/12/15 1034  BP: 100/62  Pulse: 68  Temp: 98.1 F (36.7 C)  TempSrc: Oral  Height: 5\' 4"  (1.626 m)  Weight: 175 lb (79.379 kg)  SpO2: 94%   2L [place]  Gen: Chronically ill appearing, no acute distress HEENT: NCAT,  EOMi, OP clear, PULM: Clear to auscultation bilaterally, poor air movement CV: RRR, no mgr, no JVD GI: BS+, soft, nontender,  Musculoskeletal: Normal bulk and tone, no edema, no clubbing, no cyanosis Derm: no rash or skin breakdown Neuro: A&Ox4, MAEW  July 2014 CT chest>  significant upper lobe emphysema, very mild interstitial thickening in the bases versus atelectasis, most predominant  finding is emphysema overall   April 2015 Myoview stress test normal  2014 TTE > LVEF 60-65%, normal LV size and function, mild AR     Assessment & Plan:   Back pain She has been complaining of back pain and has been diagnosed with scoliosis as well as arthritis recently. This is the most likely etiology. However, considering her smoking history we will get a chest x-ray to ensure that there is no lung abnormality which could explain this as it does occur in the mid back. I could find nothing on exam to explain the pain.  Plan: Chest x-ray   Chronic respiratory failure Continue 2 L of oxygen continuously   COPD, severe This has been a stable interval for [place]. However, she remains frustrated with her severe shortness of breath from her emphysema. I explained again today at length that she is on maximum therapy for her COPD. Further, I want her to stay as active as possible because of the severity of her disease.  Plan: I offered physical therapy, she refused Encouraged regular activity Continue Spiriva and Advair Follow-up 6 months     Updated Medication List Outpatient Encounter Prescriptions as of 05/12/2015  Medication Sig  . albuterol (PROVENTIL HFA;VENTOLIN HFA) 108 (90 BASE) MCG/ACT inhaler Inhale 2 puffs into the lungs every 6 (six) hours as needed for wheezing or shortness of breath.  Marland Kitchen atorvastatin (LIPITOR) 20 MG tablet Take 20  mg by mouth daily.  . carvedilol (COREG) 6.25 MG tablet Take 6.25 mg by mouth 2 (two) times daily with a meal.  . citalopram (CELEXA) 20 MG tablet Take 20 mg by mouth daily.  . Fluticasone-Salmeterol (ADVAIR) 250-50 MCG/DOSE AEPB Inhale 1 puff into the lungs 2 (two) times daily.  . potassium chloride (MICRO-K) 10 MEQ CR capsule Take 10 mEq by mouth daily.  . roflumilast (DALIRESP) 500 MCG TABS tablet Take 1 tablet (500 mcg total) by mouth daily.  Marland Kitchen tiotropium (SPIRIVA) 18 MCG inhalation capsule Place 1 capsule (18 mcg total) into inhaler  and inhale daily.  Marland Kitchen torsemide (DEMADEX) 20 MG tablet Take 20 mg by mouth daily.   No facility-administered encounter medications on file as of 05/12/2015.

## 2015-05-12 NOTE — Assessment & Plan Note (Signed)
This has been a stable interval for [name]. However, she remains frustrated with her severe shortness of breath from her emphysema. I explained again today at length that she is on maximum therapy for her COPD. Further, I want her to stay as active as possible because of the severity of her disease.  Plan: I offered physical therapy, she refused Encouraged regular activity Continue Spiriva and Advair Follow-up 6 months

## 2015-05-12 NOTE — Assessment & Plan Note (Signed)
Continue 2 L of oxygen continuously. 

## 2015-05-12 NOTE — Assessment & Plan Note (Signed)
She has been complaining of back pain and has been diagnosed with scoliosis as well as arthritis recently. This is the most likely etiology. However, considering her smoking history we will get a chest x-ray to ensure that there is no lung abnormality which could explain this as it does occur in the mid back. I could find nothing on exam to explain the pain.  Plan: Chest x-ray

## 2015-05-12 NOTE — Patient Instructions (Signed)
Stay as [name] as you can Try to exercise regulalry  Keep using your oxygen as you are doing  Keep using your medications as you are doing.  We will see you back in 6 months or sooner if needed.

## 2015-06-21 ENCOUNTER — Telehealth: Payer: Self-pay | Admitting: Pulmonary Disease

## 2015-06-21 MED ORDER — ROFLUMILAST 500 MCG PO TABS: 500.0000 ug | ORAL_TABLET | Freq: Every day | ORAL | Status: DC

## 2015-06-21 NOTE — Telephone Encounter (Signed)
Spoke with pt. Made her aware RX was refilled back in April. The pharm never received this. I have sent it in. Nothing further needed

## 2015-08-19 ENCOUNTER — Telehealth: Payer: Self-pay | Admitting: Pulmonary Disease

## 2015-08-19 MED ORDER — TIOTROPIUM BROMIDE MONOHYDRATE 18 MCG IN CAPS: 18.0000 ug | ORAL_CAPSULE | Freq: Every day | RESPIRATORY_TRACT | Status: DC

## 2015-08-19 NOTE — Telephone Encounter (Signed)
Rx sent Patient notified Nothing further needed.  

## 2015-10-07 ENCOUNTER — Encounter: Payer: Self-pay | Admitting: Pulmonary Disease

## 2015-10-07 ENCOUNTER — Ambulatory Visit (INDEPENDENT_AMBULATORY_CARE_PROVIDER_SITE_OTHER): Payer: PPO | Admitting: Pulmonary Disease

## 2015-10-07 VITALS — BP 122/80 | HR 77 | Ht 64.0 in | Wt 178.0 lb

## 2015-10-07 DIAGNOSIS — J438 Other emphysema: Secondary | ICD-10-CM

## 2015-10-07 NOTE — Progress Notes (Signed)
PROBLEMS: COPD/emphysema Mild obesity  INTERVAL HISTORY: No new problems  SUBJ: Remains moderately limited by dyspnea. Can walk 14 stairs into her home but then must rest a couple of minutes to catch her breat. No significant cough or sputum production. Denies CP, hemoptysis, LE edema, calf tenderness  OBJ: Filed Vitals:   10/07/15 1050 10/07/15 1051  BP:  122/80  Pulse:  77  Height: 5\' 4"  (1.626 m)   Weight: 178 lb (80.74 kg)   SpO2:  99%  NAD HEENT WNL No JVD BS mildly diminished, no wheezes Reg, no M Mildly obese, NABS, soft No C/C/E  DATA: Prior CXR, PFTs, CT chest reviewed  IMPRESSION: COPD - predominantly emphysema. Symptoms are well controlled on Advair, Spiriva, Daliresp. We discussed whether all three of these medications are necessary. She is unable to say whether the addition of Daliresp added any benefit Mild obesity - likely a minor contributor to her exercise limitation  PLAN: Continue current medications OK to try off of Daliresp for a couple of weeks to determine whether it is adding anything to symptom control I encouraged experimenting with albuterol inhaler to see if there are times that it provides benefit with shortness of breath We discussed weight loss and I encouraged a low carbohydrate strategy limiting simple sugars and simple carbs Goal wt next visit is 168 lb on our scales (165# on her scales) ROV in 3 months. Call sooner if needed    Jasmine Mcardle, MD [facility]

## 2015-10-07 NOTE — Patient Instructions (Signed)
Continue current medications You have permission to try off of Daliresp for a couple of weeks to determine whether that is adding anything to your symptom control I encourage experimenting with your albuterol inhaler to see if there are times that it provides you benefit with your shortness of breath We discussed weight loss and I have encouraged a low carbohydrate strategy limiting simple sugars and simple carbs Goal wt next visit is 168 lb on our scales  We will see you again in 3 months. Call sooner if needed

## 2015-12-05 ENCOUNTER — Telehealth: Payer: Self-pay | Admitting: Pulmonary Disease

## 2015-12-05 MED ORDER — TIOTROPIUM BROMIDE MONOHYDRATE 18 MCG IN CAPS: 18.0000 ug | ORAL_CAPSULE | Freq: Every day | RESPIRATORY_TRACT | Status: DC

## 2015-12-05 NOTE — Telephone Encounter (Signed)
Called and spoke with pt Pt requesting refill on her Spiriva  Informed pt that i would send in refill  Nothing further is needed

## 2016-01-11 ENCOUNTER — Encounter: Payer: Self-pay | Admitting: Pulmonary Disease

## 2016-01-11 ENCOUNTER — Ambulatory Visit (INDEPENDENT_AMBULATORY_CARE_PROVIDER_SITE_OTHER): Payer: PPO | Admitting: Pulmonary Disease

## 2016-01-11 VITALS — BP 134/70 | HR 85 | Ht 64.0 in | Wt 177.0 lb

## 2016-01-11 DIAGNOSIS — J438 Other emphysema: Secondary | ICD-10-CM

## 2016-01-11 DIAGNOSIS — R0902 Hypoxemia: Secondary | ICD-10-CM | POA: Diagnosis not present

## 2016-01-11 DIAGNOSIS — E669 Obesity, unspecified: Secondary | ICD-10-CM

## 2016-01-11 NOTE — Progress Notes (Signed)
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 Mild obesity  DATA: Spirometry 2014: FEV1 0.78 liters (38% pred)  CT chest 07/06/13: mod - severe emphysema. Chronic RLL scarring CXR 05/12/15: Advanced chronic obstructive pulmonary disease with similar right basilar scarring. No acute findings   LAST VISIT (10/07/15): IMPRESSION: COPD - predominantly emphysema. Symptoms are well controlled on Advair, Spiriva, Daliresp. We discussed whether all three of these medications are necessary. She is unable to say whether the addition of Daliresp added any benefit Mild obesity - likely a minor contributor to her exercise limitation  PLAN: Continue current medications OK to try off of Daliresp for a couple of weeks to determine whether it is adding anything to symptom control I encouraged experimenting with albuterol inhaler to see if there are times that it provides benefit with shortness of breath We discussed weight loss and I encouraged a low carbohydrate strategy limiting simple sugars and simple carbs Goal wt next visit is 168 lb on our scales (165# on her scales) ROV in 3 months. Call sooner if needed  INTERVAL HISTORY: No new problems  SUBJ: Remains moderately limited by dyspnea. No new complaints. No significant cough or sputum production. Denies CP, hemoptysis, LE edema, calf tenderness. She notices no deterioration in respiratory symptoms since stopping Daliresp  OBJ: Filed Vitals:   01/11/16 1046  BP: 134/70  Pulse: 85  Height: 5\' 4"  (1.626 m)  Weight: 177 lb (80.287 kg)  SpO2: 93%  NAD HEENT WNL No JVD BS mildly diminished, no wheezes Reg, no M Mildly obese, NABS, soft No C/C/E  DATA: Prior CXR, PFTs, CT chest again reviewed  IMPRESSION: COPD - predominantly emphysema. Symptoms remain reasonably well controlled on Advair, Spiriva.  Chronic hypoxemia Mild obesity - likely a minor contributor to her exercise limitation but perhaps the most reversible component. [place]  since last visit  PLAN: Continue Advair, Spiriva, PRN albuterol Continue supplemental O2 We again discussed weight loss and I again encouraged a low carbohydrate strategy limiting simple sugars and simple carbs Goal wt next visit is 168 lb on our scales (165# on her scales) ROV in 4 months. Call sooner if needed    Wilhelmina Mcardle, MD [facility]

## 2016-01-11 NOTE — Patient Instructions (Addendum)
Continue current medications and oxygen therapy You may remain off of Daliresp if you do not noticing any difference in symptoms after stopping it Continue efforts @ weight loss with a goal weight of 165 lbs Follow up in 3-4 months

## 2016-01-12 DIAGNOSIS — J449 Chronic obstructive pulmonary disease, unspecified: Secondary | ICD-10-CM | POA: Diagnosis not present

## 2016-01-26 DIAGNOSIS — Z961 Presence of intraocular lens: Secondary | ICD-10-CM | POA: Diagnosis not present

## 2016-01-26 DIAGNOSIS — H04123 Dry eye syndrome of bilateral lacrimal glands: Secondary | ICD-10-CM | POA: Diagnosis not present

## 2016-02-12 DIAGNOSIS — J449 Chronic obstructive pulmonary disease, unspecified: Secondary | ICD-10-CM | POA: Diagnosis not present

## 2016-03-11 DIAGNOSIS — J449 Chronic obstructive pulmonary disease, unspecified: Secondary | ICD-10-CM | POA: Diagnosis not present

## 2016-03-26 DIAGNOSIS — M545 Low back pain: Secondary | ICD-10-CM | POA: Diagnosis not present

## 2016-04-06 ENCOUNTER — Telehealth: Payer: Self-pay | Admitting: Pulmonary Disease

## 2016-04-06 MED ORDER — TIOTROPIUM BROMIDE MONOHYDRATE 18 MCG IN CAPS: 18.0000 ug | ORAL_CAPSULE | Freq: Every day | RESPIRATORY_TRACT | Status: DC

## 2016-04-06 NOTE — Telephone Encounter (Signed)
Medication sent. Nothing further needed.

## 2016-04-06 NOTE — Telephone Encounter (Signed)
°*  STAT* If patient is at the pharmacy, call can be transferred to refill team.   1. Which medications need to be refilled? (please list name of each medication and dose if known)   Spiriva 18 mcg inhale 1 capsule daily   2. Which pharmacy/location (including street and city if local pharmacy) is medication to be sent to? [facility] mail order   3. Do they need a 30 day or 90 day supply? [place]

## 2016-04-11 DIAGNOSIS — J449 Chronic obstructive pulmonary disease, unspecified: Secondary | ICD-10-CM | POA: Diagnosis not present

## 2016-05-11 DIAGNOSIS — J449 Chronic obstructive pulmonary disease, unspecified: Secondary | ICD-10-CM | POA: Diagnosis not present

## 2016-05-29 ENCOUNTER — Ambulatory Visit (INDEPENDENT_AMBULATORY_CARE_PROVIDER_SITE_OTHER): Payer: PPO | Admitting: Pulmonary Disease

## 2016-05-29 ENCOUNTER — Encounter: Payer: Self-pay | Admitting: Pulmonary Disease

## 2016-05-29 VITALS — BP 112/60 | HR 78 | Ht 64.0 in | Wt 177.0 lb

## 2016-05-29 DIAGNOSIS — R0902 Hypoxemia: Secondary | ICD-10-CM | POA: Diagnosis not present

## 2016-05-29 DIAGNOSIS — J449 Chronic obstructive pulmonary disease, unspecified: Secondary | ICD-10-CM

## 2016-05-29 MED ORDER — TIOTROPIUM BROMIDE MONOHYDRATE 18 MCG IN CAPS: 18.0000 ug | ORAL_CAPSULE | Freq: Every day | RESPIRATORY_TRACT | Status: DC

## 2016-05-29 MED ORDER — FLUTICASONE-SALMETEROL 250-50 MCG/DOSE IN AEPB: 1.0000 | INHALATION_SPRAY | Freq: Two times a day (BID) | RESPIRATORY_TRACT | Status: DC

## 2016-05-29 NOTE — Progress Notes (Signed)
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  Mild obesity  DATA: Spirometry 2014: FEV1 0.78 liters (38% pred)  CT chest 07/06/13: mod - severe emphysema. Chronic RLL scarring CXR 05/12/15: Advanced chronic obstructive pulmonary disease with similar right basilar scarring. No acute findings   INTERVAL HISTORY: No events  SUBJ: No change in class III/IV dyspnea. Remains on O2 @ 2 lpm [place] 24 hrs/d. Denies CP, fever, purulent sputum, hemoptysis, LE edema and calf tenderness   OBJ: Filed Vitals:   05/29/16 1030  BP: 112/60  Pulse: 78  Height: 5\' 4"  (1.626 m)  Weight: 177 lb (80.287 kg)  SpO2: 92%  NAD HEENT WNL No JVD BS mildly diminished, no wheezes Reg, no M Mildly obese, NABS, soft No C/C/E  DATA: No new CXR, PFTs  IMPRESSION: COPD - predominantly emphysema. Symptoms remain reasonably well controlled on Advair, Spiriva.  Chronic hypoxemia Mild obesity - likely a minor contributor to her exercise limitation but perhaps the most reversible component. [place] since last visit  PLAN: Continue Advair, Spiriva, PRN albuterol Continue supplemental O2 We again discussed weight loss. She is staring on a weight loss plan with her husband ROV in 4-6 months. Call sooner if needed  Merton Border, MD PCCM service Mobile [phone] Pager [phone] 05/29/2016

## 2016-06-19 DIAGNOSIS — E1122 Type 2 diabetes mellitus with diabetic chronic kidney disease: Secondary | ICD-10-CM | POA: Diagnosis not present

## 2016-06-19 DIAGNOSIS — Z0000 Encounter for general adult medical examination without abnormal findings: Secondary | ICD-10-CM | POA: Insufficient documentation

## 2016-06-19 DIAGNOSIS — J42 Unspecified chronic bronchitis: Secondary | ICD-10-CM | POA: Diagnosis not present

## 2016-06-19 DIAGNOSIS — I10 Essential (primary) hypertension: Secondary | ICD-10-CM | POA: Diagnosis not present

## 2016-06-19 DIAGNOSIS — E1169 Type 2 diabetes mellitus with other specified complication: Secondary | ICD-10-CM | POA: Diagnosis not present

## 2016-06-19 DIAGNOSIS — E785 Hyperlipidemia, unspecified: Secondary | ICD-10-CM | POA: Diagnosis not present

## 2016-06-19 DIAGNOSIS — N183 Chronic kidney disease, stage 3 (moderate): Secondary | ICD-10-CM | POA: Diagnosis not present

## 2016-06-20 DIAGNOSIS — N183 Chronic kidney disease, stage 3 (moderate): Secondary | ICD-10-CM | POA: Diagnosis not present

## 2016-06-20 DIAGNOSIS — E785 Hyperlipidemia, unspecified: Secondary | ICD-10-CM | POA: Diagnosis not present

## 2016-06-20 DIAGNOSIS — E1122 Type 2 diabetes mellitus with diabetic chronic kidney disease: Secondary | ICD-10-CM | POA: Diagnosis not present

## 2016-06-20 DIAGNOSIS — E1169 Type 2 diabetes mellitus with other specified complication: Secondary | ICD-10-CM | POA: Diagnosis not present

## 2016-06-25 ENCOUNTER — Telehealth: Payer: Self-pay | Admitting: Pulmonary Disease

## 2016-06-25 MED ORDER — FLUTICASONE-SALMETEROL 250-50 MCG/DOSE IN AEPB: 1.0000 | INHALATION_SPRAY | Freq: Two times a day (BID) | RESPIRATORY_TRACT | Status: DC

## 2016-06-25 NOTE — Telephone Encounter (Signed)
RX sent. Nothing further needed. 

## 2016-06-25 NOTE — Telephone Encounter (Signed)
*  STAT* If patient is at the pharmacy, call can be transferred to refill team.   1. Which medications need to be refilled? (please list name of each medication and dose if known) Fluticasone-Salmeterol (ADVAIR) 250-50 MCG/DOSE AEPB  2. Which pharmacy/location (including street and city if local pharmacy) is medication to be sent to? [facility] (mail order)  3. Do they need a 30 day or 90 day supply? 30 day

## 2016-08-09 ENCOUNTER — Encounter: Payer: Self-pay | Admitting: Podiatry

## 2016-08-09 ENCOUNTER — Ambulatory Visit (INDEPENDENT_AMBULATORY_CARE_PROVIDER_SITE_OTHER): Payer: PPO | Admitting: Podiatry

## 2016-08-09 DIAGNOSIS — B351 Tinea unguium: Secondary | ICD-10-CM | POA: Diagnosis not present

## 2016-08-09 DIAGNOSIS — L03039 Cellulitis of unspecified toe: Secondary | ICD-10-CM

## 2016-08-09 DIAGNOSIS — L601 Onycholysis: Secondary | ICD-10-CM

## 2016-08-09 MED ORDER — CEPHALEXIN 500 MG PO CAPS: 500.0000 mg | ORAL_CAPSULE | Freq: Three times a day (TID) | ORAL | 2 refills | Status: DC

## 2016-08-09 NOTE — Patient Instructions (Signed)

## 2016-08-09 NOTE — Progress Notes (Signed)
   Subjective:    Patient ID: [name], female    DOB: 08/25/1941, 75 y.o.   MRN: [id number]  HPI  75 year old female presents the office they for concerns of left big toenail, and if that she has noticed that the last week. She states that occasionally hurts. She has noticed some drainage coming from the toenail. There is also been redness around the toenail. She denies any red streaks. No other recent treatment. She denies any recent injury or trauma. No other complaints. Review of Systems  All other systems reviewed and are negative.      Objective:   Physical Exam General: AAO x3, NAD  Dermatological: The left hallux toenail is loose laterally nailbed and only here to pay along the very proximal nail border. There is erythema around the entire nail border and upon evaluation there is pus expressed from underneath the toenail. Mild tailors palpation along the central aspect of the toenail. No other open lesions or pre-ulcer lesions.  Vascular: Dorsalis Pedis artery and Posterior Tibial artery pedal pulses are 2/4 bilateral with immedate capillary fill time. There is no pain with calf compression, swelling, warmth, erythema.   Neruologic: Grossly intact via light touch bilateral.    Musculoskeletal: Tenderness on the left hallux toenail. No other areas tenderness bilaterally.  Gait: Unassisted, Nonantalgic.      Assessment & Plan:  75 year old female left hallux onycholysis with localized infection -Treatment options discussed including all alternatives, risks, and complications -Etiology of symptoms were discussed -At this time, recommended total nail removal without chemical matricectomy to the left hallux toenail due to infection. Risks and complications were discussed with the patient for which they understand and  verbally consent to the procedure. Under sterile conditions a total of 3 mL of a mixture of 2% lidocaine plain and 0.5% Marcaine plain was infiltrated in a  hallux block fashion. Once anesthetized, the skin was prepped in sterile fashion. A tourniquet was then applied. Next the left hallux toenail  was sharply excised making sure to remove the entire offending nail border. Once the nail was  Removed, the area was debrided and the underlying skin was intact. The area was irrigated and hemostasis was obtained.  A dry sterile dressing was applied. After application of the dressing the tourniquet was removed and there is found to be an immediate capillary refill time to the digit. The patient tolerated the procedure well any complications. Post procedure instructions were discussed the patient for which he verbally understood. Follow-up in one week for nail check or sooner if any problems are to arise. Discussed signs/symptoms of worsening infection and directed to call the office immediately should any occur or go directly to the emergency room. In the meantime, encouraged to call the office with any questions, concerns, changes symptoms. -Keflex  Celesta Gentile, DPM

## 2016-08-13 ENCOUNTER — Encounter: Payer: Self-pay | Admitting: Podiatry

## 2016-08-21 ENCOUNTER — Encounter: Payer: Self-pay | Admitting: Podiatry

## 2016-08-21 ENCOUNTER — Ambulatory Visit (INDEPENDENT_AMBULATORY_CARE_PROVIDER_SITE_OTHER): Payer: PPO | Admitting: Podiatry

## 2016-08-21 DIAGNOSIS — L03039 Cellulitis of unspecified toe: Secondary | ICD-10-CM

## 2016-08-21 DIAGNOSIS — Z9889 Other specified postprocedural states: Secondary | ICD-10-CM

## 2016-08-21 DIAGNOSIS — L601 Onycholysis: Secondary | ICD-10-CM

## 2016-08-21 NOTE — Patient Instructions (Signed)

## 2016-08-26 NOTE — Progress Notes (Signed)
Subjective: [name] is a 75 y.o.  female returns to office today for follow up evaluation after having left Hallux total nail avulsion performed. Patient has been soaking using epsom salts and applying topical antibiotic covered with bandaid daily. Patient denies fevers, chills, nausea, vomiting. Denies any calf pain, chest pain, SOB.   Objective:  Vitals: Reviewed  General: Well developed, nourished, in no acute distress, alert and oriented x3   Dermatology: Skin is warm, dry and supple bilateral. Left hallux nail bed appears to be clean, dry, with mild granular tissue and surrounding scab. There is no surrounding erythema, edema, drainage/purulence. The remaining nails appear unremarkable at this time. There are no other lesions or other signs of infection present.  Neurovascular status: Intact. No lower extremity swelling; No pain with calf compression bilateral.  Musculoskeletal: Np tenderness to palpation of the left hallux nail bed. Muscular strength within normal limits bilateral.   Assesement and Plan: S/p partial nail avulsion, doing well.   -Continue soaking in epsom salts twice a day followed by antibiotic ointment and a band-aid. Can leave uncovered at night. Continue this until completely healed.  -If the area has not healed in 2 weeks, call the office for follow-up appointment, or sooner if any problems arise.  -Monitor for any signs/symptoms of infection. Call the office immediately if any occur or go directly to the emergency room. Call with any questions/concerns.  Celesta Gentile, DPM

## 2016-09-20 ENCOUNTER — Encounter: Payer: Self-pay | Admitting: Podiatry

## 2016-09-20 ENCOUNTER — Ambulatory Visit (INDEPENDENT_AMBULATORY_CARE_PROVIDER_SITE_OTHER): Payer: PPO | Admitting: Podiatry

## 2016-09-20 DIAGNOSIS — B351 Tinea unguium: Secondary | ICD-10-CM | POA: Diagnosis not present

## 2016-09-20 NOTE — Progress Notes (Signed)
Subjective:  75 year old female presents the op city discussed nail culture results. She states the left hallux toenail has done well status post removal. She's had no problems. Denies any redness or drainage or any swelling. Denies any systemic complaints such as fevers, chills, nausea, vomiting. No acute changes since last appointment, and no other complaints at this time.   Objective: AAO x3, NAD DP/PT pulses palpable bilaterally, CRT less than 3 seconds Procedure site on the hallux toenail is well-healed. There is no edema, erythema, drainage or any signs of infection. The remainder the toenails are somewhat dystrophic, discolored. No tenderness to the other toenails. No edema, erythema, increase in warmth to bilateral lower extremities.  No open lesions or pre-ulcerative lesions.  No pain with calf compression, swelling, warmth, erythema  Assessment:  75 year old female onychomycosis  Plan: -All treatment options discussed with the patient including all alternatives, risks, complications.  -Nail culture results were discussed with the patient. Discussed treatment options. She is up to proceed with topical treatment. Compound cream to risks of the pharmacy was ordered. Directed on use and application instructions and duration. -Patient encouraged to call the office with any questions, concerns, change in symptoms.   Celesta Gentile, DPM

## 2016-09-28 ENCOUNTER — Telehealth: Payer: Self-pay | Admitting: *Deleted

## 2016-09-28 MED ORDER — NONFORMULARY OR COMPOUNDED ITEM: 11 refills | Status: DC

## 2016-09-28 NOTE — Telephone Encounter (Signed)
Pt states she was to get a prescription from [facility], but has not received. LOV 09/20/2016 states pt is ready to proceed with topical fungal treatment.  Dr. Jacqualyn Posey states [facility]ych[facility]osis Nail Lacquer. Apologized for delay and explained [employer] and processing, gave pt [phone] for contact. Faxed to [employer].

## 2016-11-26 ENCOUNTER — Encounter: Payer: Self-pay | Admitting: Pulmonary Disease

## 2016-11-26 ENCOUNTER — Ambulatory Visit (INDEPENDENT_AMBULATORY_CARE_PROVIDER_SITE_OTHER): Payer: PPO | Admitting: Pulmonary Disease

## 2016-11-26 VITALS — BP 122/62 | HR 74 | Ht 64.0 in | Wt 177.8 lb

## 2016-11-26 DIAGNOSIS — R0902 Hypoxemia: Secondary | ICD-10-CM | POA: Diagnosis not present

## 2016-11-26 DIAGNOSIS — J441 Chronic obstructive pulmonary disease with (acute) exacerbation: Secondary | ICD-10-CM | POA: Diagnosis not present

## 2016-11-26 DIAGNOSIS — J449 Chronic obstructive pulmonary disease, unspecified: Secondary | ICD-10-CM | POA: Diagnosis not present

## 2016-11-26 MED ORDER — PREDNISONE 20 MG PO TABS: 40.0000 mg | ORAL_TABLET | Freq: Every day | ORAL | 0 refills | Status: AC

## 2016-11-26 MED ORDER — DOXYCYCLINE HYCLATE 100 MG PO TABS: 100.0000 mg | ORAL_TABLET | Freq: Two times a day (BID) | ORAL | 0 refills | Status: DC

## 2016-11-26 NOTE — Patient Instructions (Signed)
Prednisone 40 mg daily for 5 days Doxycycline 100 mg twice a day for 7 days Continue Advair and Spiriva Use albuterol inhaler more liberally - up to 4 times per day for now Follow up with me in 2 weeks

## 2016-11-28 NOTE — Progress Notes (Signed)
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  Mild obesity  DATA: Spirometry 2014: FEV1 0.78 liters (38% pred)  CT chest 07/06/13: mod - severe emphysema. Chronic RLL scarring CXR 05/12/15: Advanced chronic obstructive pulmonary disease with similar right basilar scarring. No acute findings   INTERVAL HISTORY: No major events  SUBJ: Was in [place] until 3 weeks ago when she developed "cold" and URI symptoms - initially staring in nose and throat and progressing to chest congestion and worsening dyspnea. She has a pulse oximeter @ home and has noted hypoxemia. She has turned her [place] O2 flow from 2 to 3 lpm [place]. Denies CP, fever, hemoptysis, LE edema and calf tenderness.   OBJ: Vitals:   11/26/16 1408  BP: 122/62  Pulse: 74  SpO2: 92%  Weight: 177 lb 12.8 oz (80.6 kg)  Height: 5\' 4"  (1.626 m)  3 LPM [place]  NAD HEENT WNL No JVD BS mildly diminished, no wheezes Reg, no M Mildly obese, NABS, soft No C/C/E  DATA: No new CXR, PFTs  IMPRESSION: COPD - predominantly emphysema. Mod-severe obstruction  Chronic hypoxemic respiratory failure AECOPD  PLAN: Cont chronic regimen: Continue Advair, Spiriva, PRN albuterol, supplemental O2  Acute therapies for AECOPD: Prednisone 40 mg daily for 5 days Doxycycline 100 mg twice a day for 7 days Encouraged more liberal use of albuterol rescue MDI until she has returned to baseline ROV in 2 weeks. Call later this week if not improving  Merton Border, MD PCCM service Mobile [phone] Pager [phone] 11/28/2016

## 2016-12-10 ENCOUNTER — Ambulatory Visit: Payer: PPO | Admitting: Pulmonary Disease

## 2016-12-14 ENCOUNTER — Ambulatory Visit (INDEPENDENT_AMBULATORY_CARE_PROVIDER_SITE_OTHER): Payer: PPO | Admitting: Pulmonary Disease

## 2016-12-14 ENCOUNTER — Encounter: Payer: Self-pay | Admitting: Pulmonary Disease

## 2016-12-14 VITALS — BP 126/62 | HR 65 | Ht 64.0 in | Wt 182.0 lb

## 2016-12-14 DIAGNOSIS — J449 Chronic obstructive pulmonary disease, unspecified: Secondary | ICD-10-CM

## 2016-12-14 NOTE — Progress Notes (Signed)
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  Mild obesity  DATA: Spirometry 2014: FEV1 0.78 liters (38% pred)  CT chest 07/06/13: mod - severe emphysema. Chronic RLL scarring CXR 05/12/15: Advanced chronic obstructive pulmonary disease with similar right basilar scarring. No acute findings   INTERVAL HISTORY: Treated last visit for COPD exacerbation. This is a follow up to ensure resolution   SUBJ:    OBJ: Vitals:   12/14/16 1117  BP: 126/62  Pulse: 65  SpO2: 93%  Weight: 182 lb (82.6 kg)  Height: 5\' 4"  (1.626 m)  2 LPM [place]  NAD HEENT WNL No JVD BS moderately diminished, faint isolated wheeze anteriorly on L Reg, no M Mildly obese, NABS, soft No C/C/E  DATA: No new CXR, PFTs  IMPRESSION: COPD - predominantly emphysema. Mod-severe obstruction  Chronic hypoxemic respiratory failure AECOPD - resolved  PLAN: Cont chronic regimen: Continue Advair, Spiriva, PRN albuterol, supplemental O2 We discussed the importance of oxygen therapy Follow up in 3 months or as needed  Merton Border, MD PCCM service Mobile [phone] Pager [phone] 12/14/2016

## 2016-12-14 NOTE — Patient Instructions (Signed)
Continue Advair and Spiriva Follow up in 3 months or as needed

## 2016-12-17 DIAGNOSIS — Z0000 Encounter for general adult medical examination without abnormal findings: Secondary | ICD-10-CM | POA: Diagnosis not present

## 2016-12-17 DIAGNOSIS — E785 Hyperlipidemia, unspecified: Secondary | ICD-10-CM | POA: Diagnosis not present

## 2016-12-17 DIAGNOSIS — N183 Chronic kidney disease, stage 3 (moderate): Secondary | ICD-10-CM | POA: Diagnosis not present

## 2016-12-17 DIAGNOSIS — J42 Unspecified chronic bronchitis: Secondary | ICD-10-CM | POA: Diagnosis not present

## 2016-12-17 DIAGNOSIS — F330 Major depressive disorder, recurrent, mild: Secondary | ICD-10-CM | POA: Diagnosis not present

## 2016-12-17 DIAGNOSIS — E1169 Type 2 diabetes mellitus with other specified complication: Secondary | ICD-10-CM | POA: Diagnosis not present

## 2016-12-17 DIAGNOSIS — I10 Essential (primary) hypertension: Secondary | ICD-10-CM | POA: Diagnosis not present

## 2016-12-17 DIAGNOSIS — E1122 Type 2 diabetes mellitus with diabetic chronic kidney disease: Secondary | ICD-10-CM | POA: Diagnosis not present

## 2017-01-21 DIAGNOSIS — F330 Major depressive disorder, recurrent, mild: Secondary | ICD-10-CM | POA: Diagnosis not present

## 2017-01-21 DIAGNOSIS — E785 Hyperlipidemia, unspecified: Secondary | ICD-10-CM | POA: Diagnosis not present

## 2017-01-21 DIAGNOSIS — Z0000 Encounter for general adult medical examination without abnormal findings: Secondary | ICD-10-CM | POA: Diagnosis not present

## 2017-01-21 DIAGNOSIS — I10 Essential (primary) hypertension: Secondary | ICD-10-CM | POA: Diagnosis not present

## 2017-01-21 DIAGNOSIS — E1122 Type 2 diabetes mellitus with diabetic chronic kidney disease: Secondary | ICD-10-CM | POA: Diagnosis not present

## 2017-01-21 DIAGNOSIS — N183 Chronic kidney disease, stage 3 (moderate): Secondary | ICD-10-CM | POA: Diagnosis not present

## 2017-01-21 DIAGNOSIS — E1169 Type 2 diabetes mellitus with other specified complication: Secondary | ICD-10-CM | POA: Diagnosis not present

## 2017-02-15 ENCOUNTER — Other Ambulatory Visit: Payer: Self-pay | Admitting: Pulmonary Disease

## 2017-02-18 ENCOUNTER — Telehealth: Payer: Self-pay | Admitting: Pulmonary Disease

## 2017-02-18 NOTE — Telephone Encounter (Signed)
[facility] asking [place] to call back about a PA on Proventil inhaler

## 2017-02-21 MED ORDER — FLUTICASONE-SALMETEROL 250-50 MCG/DOSE IN AEPB: 1.0000 | INHALATION_SPRAY | Freq: Two times a day (BID) | RESPIRATORY_TRACT | 3 refills | Status: DC

## 2017-02-21 MED ORDER — TIOTROPIUM BROMIDE MONOHYDRATE 18 MCG IN CAPS: 18.0000 ug | ORAL_CAPSULE | Freq: Every day | RESPIRATORY_TRACT | 3 refills | Status: DC

## 2017-02-21 MED ORDER — ALBUTEROL SULFATE HFA 108 (90 BASE) MCG/ACT IN AERS: 2.0000 | INHALATION_SPRAY | Freq: Four times a day (QID) | RESPIRATORY_TRACT | 3 refills | Status: DC | PRN

## 2017-02-21 NOTE — Telephone Encounter (Signed)
Called [facility] to see what is needed on Proventil. Proventil is no longer covered but Proair is. Proair has been sent to [employer] order per pt request.

## 2017-03-11 DIAGNOSIS — J449 Chronic obstructive pulmonary disease, unspecified: Secondary | ICD-10-CM | POA: Diagnosis not present

## 2017-04-04 ENCOUNTER — Encounter: Payer: Self-pay | Admitting: Pulmonary Disease

## 2017-04-04 ENCOUNTER — Ambulatory Visit
Admission: RE | Admit: 2017-04-04 | Discharge: 2017-04-04 | Disposition: A | Discharge status: Home / Self Care | Payer: PPO | Source: Ambulatory Visit | Attending: Pulmonary Disease | Admitting: Pulmonary Disease

## 2017-04-04 ENCOUNTER — Ambulatory Visit (INDEPENDENT_AMBULATORY_CARE_PROVIDER_SITE_OTHER): Payer: PPO | Admitting: Pulmonary Disease

## 2017-04-04 VITALS — BP 122/68 | HR 60 | Wt 179.0 lb

## 2017-04-04 DIAGNOSIS — J9611 Chronic respiratory failure with hypoxia: Secondary | ICD-10-CM

## 2017-04-04 DIAGNOSIS — J449 Chronic obstructive pulmonary disease, unspecified: Secondary | ICD-10-CM | POA: Diagnosis not present

## 2017-04-04 DIAGNOSIS — Z7189 Other specified counseling: Secondary | ICD-10-CM | POA: Diagnosis not present

## 2017-04-04 NOTE — Patient Instructions (Signed)
Continue your current medical regimen as reviewed. This includes Advair, Spiriva, albuterol as needed, oxygen therapy  Chest x-ray ordered for today  Follow-up in 3-4 months or sooner as needed

## 2017-04-07 NOTE — Progress Notes (Signed)
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  Mild obesity  DATA: Spirometry 2014: FEV1 0.78 liters (38% pred)  CT chest 07/06/13: mod - severe emphysema. Chronic RLL scarring CXR 05/12/15: Advanced chronic obstructive pulmonary disease with similar right basilar scarring. No acute findings   INTERVAL HISTORY: No major events. Last seen 12/14/16  SUBJ: "About same". No new complaints. Still with class III/IV dyspnea. Compliant with meds and O2 as documented. She rarely uses albuterol rescue inhaler. Denies CP, fever, purulent sputum, hemoptysis, LE edema and calf tenderness.  OBJ: Vitals:   04/04/17 1003  BP: 122/68  Pulse: 60  SpO2: 97%  Weight: 81.2 kg (179 lb)  2 LPM   NAD HEENT WNL No JVD BS moderately diminished, no wheezes Reg, no M Mildly obese, NABS, soft No C/C/E  DATA: No new CXR, PFTs  IMPRESSION: COPD - predominantly emphysema. Mod-severe obstruction  Chronic hypoxemic respiratory failure  PLAN: Cont chronic regimen: Continue Advair, Spiriva, PRN albuterol, supplemental O2 Chest Xray ordered this visit Follow up in 4 months or as needed  Merton Border, MD PCCM service Mobile [phone] Pager [phone] 04/07/2017  ADD: CXR reveals no acute findings  Merton Border, MD PCCM service Mobile [phone] Pager [phone] 04/07/2017

## 2017-04-11 DIAGNOSIS — J449 Chronic obstructive pulmonary disease, unspecified: Secondary | ICD-10-CM | POA: Diagnosis not present

## 2017-04-17 ENCOUNTER — Telehealth: Payer: Self-pay | Admitting: *Deleted

## 2017-04-17 NOTE — Telephone Encounter (Signed)
-----   Message from Wilhelmina Mcardle, MD sent at 04/07/2017  9:51 PM EDT ----- Please let her know that CXR from 04/06 revealed no new findings of concern  Waunita Schooner

## 2017-04-17 NOTE — Telephone Encounter (Signed)
Pt informed of results. Nothing further needed. 

## 2017-05-03 ENCOUNTER — Telehealth: Payer: Self-pay | Admitting: Pulmonary Disease

## 2017-05-03 MED ORDER — TIOTROPIUM BROMIDE MONOHYDRATE 18 MCG IN CAPS: ORAL_CAPSULE | RESPIRATORY_TRACT | 3 refills | Status: DC

## 2017-05-03 MED ORDER — FLUTICASONE-SALMETEROL 250-50 MCG/DOSE IN AEPB: 1.0000 | INHALATION_SPRAY | Freq: Two times a day (BID) | RESPIRATORY_TRACT | 3 refills | Status: DC

## 2017-05-03 NOTE — Telephone Encounter (Signed)
Pt calling stating she needs prescriptions for Advair and Spiriva  She needs the written prescription for she uses a mail order   She would like this mailed out to her  Please advise.

## 2017-05-03 NOTE — Telephone Encounter (Signed)
Called pt to inform her we can send the Advair and Spiriva to her [facility] mail order. Pt denied stating she wants printed scripts so she can mail them to [place]. Will print scripts for DS to sign and will leave them up front for pt to pick up, nothing further needed.

## 2017-05-11 DIAGNOSIS — J449 Chronic obstructive pulmonary disease, unspecified: Secondary | ICD-10-CM | POA: Diagnosis not present

## 2017-06-11 DIAGNOSIS — J449 Chronic obstructive pulmonary disease, unspecified: Secondary | ICD-10-CM | POA: Diagnosis not present

## 2017-06-12 DIAGNOSIS — N183 Chronic kidney disease, stage 3 (moderate): Secondary | ICD-10-CM | POA: Diagnosis not present

## 2017-06-12 DIAGNOSIS — E1122 Type 2 diabetes mellitus with diabetic chronic kidney disease: Secondary | ICD-10-CM | POA: Diagnosis not present

## 2017-06-12 DIAGNOSIS — E785 Hyperlipidemia, unspecified: Secondary | ICD-10-CM | POA: Diagnosis not present

## 2017-06-19 DIAGNOSIS — I10 Essential (primary) hypertension: Secondary | ICD-10-CM | POA: Diagnosis not present

## 2017-06-19 DIAGNOSIS — E1122 Type 2 diabetes mellitus with diabetic chronic kidney disease: Secondary | ICD-10-CM | POA: Diagnosis not present

## 2017-06-19 DIAGNOSIS — E785 Hyperlipidemia, unspecified: Secondary | ICD-10-CM | POA: Diagnosis not present

## 2017-06-19 DIAGNOSIS — N183 Chronic kidney disease, stage 3 (moderate): Secondary | ICD-10-CM | POA: Diagnosis not present

## 2017-06-19 DIAGNOSIS — E1169 Type 2 diabetes mellitus with other specified complication: Secondary | ICD-10-CM | POA: Diagnosis not present

## 2017-06-19 DIAGNOSIS — Z0000 Encounter for general adult medical examination without abnormal findings: Secondary | ICD-10-CM | POA: Diagnosis not present

## 2017-06-19 DIAGNOSIS — F325 Major depressive disorder, single episode, in full remission: Secondary | ICD-10-CM | POA: Diagnosis not present

## 2017-06-19 DIAGNOSIS — J42 Unspecified chronic bronchitis: Secondary | ICD-10-CM | POA: Diagnosis not present

## 2017-07-04 DIAGNOSIS — H04123 Dry eye syndrome of bilateral lacrimal glands: Secondary | ICD-10-CM | POA: Diagnosis not present

## 2017-07-11 DIAGNOSIS — J449 Chronic obstructive pulmonary disease, unspecified: Secondary | ICD-10-CM | POA: Diagnosis not present

## 2017-08-06 ENCOUNTER — Ambulatory Visit (INDEPENDENT_AMBULATORY_CARE_PROVIDER_SITE_OTHER): Payer: PPO | Admitting: Pulmonary Disease

## 2017-08-06 ENCOUNTER — Encounter: Payer: Self-pay | Admitting: Pulmonary Disease

## 2017-08-06 VITALS — BP 110/60 | HR 61 | Ht 64.0 in

## 2017-08-06 DIAGNOSIS — J449 Chronic obstructive pulmonary disease, unspecified: Secondary | ICD-10-CM

## 2017-08-06 DIAGNOSIS — J9611 Chronic respiratory failure with hypoxia: Secondary | ICD-10-CM

## 2017-08-06 DIAGNOSIS — Z87891 Personal history of nicotine dependence: Secondary | ICD-10-CM | POA: Diagnosis not present

## 2017-08-06 NOTE — Progress Notes (Signed)
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  Mild obesity  DATA: Spirometry 2014: FEV1 0.78 liters (38% pred)  CT chest 07/06/13: mod - severe emphysema. Chronic RLL scarring CXR 05/12/15: Advanced chronic obstructive pulmonary disease with similar right basilar scarring. No acute findings CXR 04/04/17: No acute findings   INTERVAL HISTORY: No major events. Last seen 12/14/16  SUBJ: This is a routine reevaluation. She is gradually worsening, now with class IV dyspnea.  Compliant with meds (Advair, Spiriva) and O2. She recently increased her oxygen flow rate to 3 LPM as her SPO2 on home pulse oximetry was below 90%. She rarely uses albuterol rescue inhaler. Denies CP, fever, purulent sputum, hemoptysis, LE edema and calf tenderness.  OBJ: Vitals:   08/06/17 0930 08/06/17 0937  BP:  110/60  Pulse:  61  SpO2:  94%  Height: 5\' 4"  (1.626 m)   3 LPM [place]  NAD in WC HEENT WNL No JVD BS markedly diminished, no wheezes Reg, no M Mildly obese, NABS, soft Minimal ankle edema  DATA: CXR (04/04/17): [place]  IMPRESSION: Severe COPD - predominantly emphysema Chronic hypoxemic respiratory failure Former smoker - quit 2014  PLAN: Continue Advair, Spiriva, PRN albuterol, supplemental O2 Follow up in 6 months or as needed  Merton Border, MD PCCM service Mobile [phone] Pager [phone]  08/06/2017 9:56 AM

## 2017-08-06 NOTE — Patient Instructions (Signed)
Continue Advair, Spiriva, albuterol as needed, supplemental O2 Follow up in 6 months or sooner as needed

## 2017-08-11 DIAGNOSIS — J449 Chronic obstructive pulmonary disease, unspecified: Secondary | ICD-10-CM | POA: Diagnosis not present

## 2017-08-23 DIAGNOSIS — H04123 Dry eye syndrome of bilateral lacrimal glands: Secondary | ICD-10-CM | POA: Diagnosis not present

## 2017-09-03 ENCOUNTER — Emergency Department: Payer: PPO

## 2017-09-03 ENCOUNTER — Emergency Department
Admission: EM | Admit: 2017-09-03 | Discharge: 2017-09-03 | Disposition: A | Discharge status: Home / Self Care | Payer: PPO | Attending: Emergency Medicine | Admitting: Emergency Medicine

## 2017-09-03 DIAGNOSIS — Z87891 Personal history of nicotine dependence: Secondary | ICD-10-CM | POA: Insufficient documentation

## 2017-09-03 DIAGNOSIS — Z79899 Other long term (current) drug therapy: Secondary | ICD-10-CM | POA: Diagnosis not present

## 2017-09-03 DIAGNOSIS — I129 Hypertensive chronic kidney disease with stage 1 through stage 4 chronic kidney disease, or unspecified chronic kidney disease: Secondary | ICD-10-CM | POA: Diagnosis not present

## 2017-09-03 DIAGNOSIS — N183 Chronic kidney disease, stage 3 (moderate): Secondary | ICD-10-CM | POA: Insufficient documentation

## 2017-09-03 DIAGNOSIS — R600 Localized edema: Secondary | ICD-10-CM

## 2017-09-03 DIAGNOSIS — E1122 Type 2 diabetes mellitus with diabetic chronic kidney disease: Secondary | ICD-10-CM | POA: Insufficient documentation

## 2017-09-03 DIAGNOSIS — L03116 Cellulitis of left lower limb: Secondary | ICD-10-CM

## 2017-09-03 DIAGNOSIS — M7989 Other specified soft tissue disorders: Secondary | ICD-10-CM | POA: Diagnosis not present

## 2017-09-03 DIAGNOSIS — J449 Chronic obstructive pulmonary disease, unspecified: Secondary | ICD-10-CM | POA: Insufficient documentation

## 2017-09-03 HISTORY — DX: Chronic obstructive pulmonary disease, unspecified: J44.9

## 2017-09-03 MED ORDER — CEPHALEXIN 500 MG PO CAPS: 500.0000 mg | ORAL_CAPSULE | Freq: Two times a day (BID) | ORAL | 0 refills | Status: DC

## 2017-09-03 MED ORDER — CEPHALEXIN 500 MG PO CAPS
500.0000 mg | ORAL_CAPSULE | Freq: Once | ORAL | Status: AC
  Administered 2017-09-03: 500 mg via ORAL
  Filled 2017-09-03: qty 1

## 2017-09-03 NOTE — Discharge Instructions (Signed)
You have been seen in the emergency department for left leg redness and swelling. Your ultrasound is negative for a blood clot. Please take your antibiotic as prescribed for its entire course. Please follow-up with your doctor in the next 2-3 days for recheck/reevaluation. Return to the emergency department for any significant increase in redness or swelling, any fever, or any other symptom personally concerning to yourself.

## 2017-09-03 NOTE — ED Provider Notes (Signed)
[facility] Emergency Department Provider Note  Time seen: 12:33 PM  I have reviewed the triage vital signs and the nursing notes.   HISTORY  Chief Complaint Leg Swelling    HPI [name] is a 76 y.o. female with a past medical history of COPD, hypertension, hyperlipidemia, depression, CK D, presents to the emergency department for left leg swelling and redness. According to the patient for the past 2 days she has noted redness warmth and swelling to her left foot and ankle. Denies any trauma. Patient presents to the emergency department concerned for possibility of blood clot. Denies any chest pain or trouble breathing. Denies any nausea or fever.  Past Medical History:  Diagnosis Date  . COPD (chronic obstructive pulmonary disease) ([place])   . Environmental allergies   . High blood pressure   . High cholesterol     Patient Active Problem List   Diagnosis Date Noted  . Onychomycosis 09/20/2016  . Health care maintenance 06/19/2016  . Back pain 05/12/2015  . Fatigue 10/13/2014  . Major depression in remission ([place]) 10/10/2014  . Type 2 diabetes mellitus with stage 3 chronic kidney disease ([place]) 06/08/2014  . Edema 05/29/2014  . HTN (hypertension), benign 05/29/2014  . Hyperlipidemia associated with type 2 diabetes mellitus ([place]) 05/29/2014  . UI (urinary incontinence) 05/29/2014  . Dyspnea 04/26/2014  . COPD (chronic obstructive pulmonary disease) ([place]) 03/25/2014  . Chronic respiratory failure ([place]) 03/25/2014    Past Surgical History:  Procedure Laterality Date  . BREAST LUMPECTOMY Bilateral 1962 (L) 2002(R)  . VESICOVAGINAL FISTULA CLOSURE W/ TAH  1975    Prior to Admission medications   Medication Sig Start Date End Date Taking? Authorizing Provider  albuterol (PROAIR HFA) 108 (90 Base) MCG/ACT inhaler Inhale 2 puffs into the lungs every 6 (six) hours as needed for wheezing or shortness of breath. 02/21/17   Wilhelmina Mcardle, MD   atorvastatin (LIPITOR) 20 MG tablet Take 20 mg by mouth daily.    [provider]  carvedilol (COREG) 6.25 MG tablet Take 6.25 mg by mouth 2 (two) times daily with a meal.    [provider]  citalopram (CELEXA) 20 MG tablet Take 20 mg by mouth daily.    [provider]  Fluticasone-Salmeterol (ADVAIR) 250-50 MCG/DOSE AEPB Inhale 1 puff into the lungs 2 (two) times daily. 05/03/17   Wilhelmina Mcardle, MD  NONFORMULARY OR COMPOUNDED ITEM [facility]:  Onychomycosis Nail Lacquer - Fluconazole 2%, Terbinafine 1%, DMSO apply to affected area daily. 09/28/16   Trula Slade, DPM  potassium chloride (MICRO-K) 10 MEQ CR capsule Take 10 mEq by mouth daily.    [provider]  tiotropium (SPIRIVA HANDIHALER) 18 MCG inhalation capsule Inhale the contents of one capsule once a day via handihaler device as directed 05/03/17   Wilhelmina Mcardle, MD  torsemide (DEMADEX) 20 MG tablet Take 20 mg by mouth daily.    [provider]    Allergies  Allergen Reactions  . Mucinex D [Pseudoephedrine-Guaifenesin Er]     Rash on neck    Family History  Problem Relation Age of Onset  . Heart disease Father   . Rheum arthritis Mother   . Cancer Mother        breast  . Heart disease Brother        2 brothers  . Cancer Maternal Aunt        cervical  . Cancer Maternal Aunt  stomach    Social History Social History  Substance Use Topics  . Smoking status: Former Smoker    Packs/day: 1.00    Years: 45.00    Types: Cigarettes    Quit date: 07/03/2013  . Smokeless tobacco: Never Used  . Alcohol use 0.0 oz/week     Comment: occasional glass of wine    Review of Systems Constitutional: Negative for fever. Cardiovascular: Negative for chest pain. Respiratory: Negative for shortness of breath. Gastrointestinal: Negative for abdominal pain, vomiting  Musculoskeletal: left foot and ankle redness and swelling Skin:mild redness of left foot and  ankle Neurological: Negative for headache All other ROS negative  ____________________________________________   PHYSICAL EXAM:  VITAL SIGNS: ED Triage Vitals  Enc Vitals Group     BP 09/03/17 1058 (!) 111/44     Pulse Rate 09/03/17 1058 63     Resp 09/03/17 1058 18     Temp 09/03/17 1058 97.8 F (36.6 C)     Temp Source 09/03/17 1058 Oral     SpO2 09/03/17 1058 96 %     Weight 09/03/17 1059 185 lb (83.9 kg)     Height 09/03/17 1059 5\' 4"  (1.626 m)     Head Circumference --      Peak Flow --      Pain Score 09/03/17 1058 3     Pain Loc --      Pain Edu? --      Excl. in [place]? --     Constitutional: Alert and oriented. Well appearing and in no distress. Eyes: Normal exam ENT   Head: Normocephalic and atraumatic   Mouth/Throat: Mucous membranes are moist. Cardiovascular: Normal rate, regular rhythm. No murmur Respiratory: Normal respiratory effort without tachypnea nor retractions. Breath sounds are clear  Gastrointestinal: Soft and nontender. No distention.  Musculoskeletal: mild edema with mild erythema of the left foot and left ankle. Nontender to palpation with good range of motion in the ankle.calf is nontender. No thigh tenderness. No popliteal fossa tenderness. Neurologic:  Normal speech and language. No gross focal neurologic deficits  Skin:  Skin is warm, dry and intact.  Psychiatric: Mood and affect are normal  ____________________________________________  RADIOLOGY  ultrasound negative for DVT.  ____________________________________________   INITIAL IMPRESSION / ASSESSMENT AND PLAN / ED COURSE  Pertinent labs & imaging results that were available during my care of the patient were reviewed by me and considered in my medical decision making (see chart for details).  the patient presents to the emergency department with 2 days of left foot and ankle swelling and redness. The area is mildly swollen with mild erythema and warmth on exam. Ultrasound is  negative for DVT. Suspect likely mild cellulitis. We will outline with a skin marker we will start the patient on Keflex and have her follow up with her primary care doctor. I discussed return precautions for any worsening redness or spread of the redness, or development of any fever.  ____________________________________________   FINAL CLINICAL IMPRESSION(S) / ED DIAGNOSES  left lower extremity cellulitis    Harvest Dark, MD 09/03/17 1236

## 2017-09-03 NOTE — ED Triage Notes (Signed)
Pt c/o pain with swelling and redness to the LLE for the past 2 days. Denies injury.Marland Kitchen

## 2017-09-06 DIAGNOSIS — M7989 Other specified soft tissue disorders: Secondary | ICD-10-CM | POA: Diagnosis not present

## 2017-09-06 DIAGNOSIS — M25572 Pain in left ankle and joints of left foot: Secondary | ICD-10-CM | POA: Diagnosis not present

## 2017-09-09 ENCOUNTER — Telehealth: Payer: Self-pay | Admitting: *Deleted

## 2017-09-09 NOTE — Telephone Encounter (Signed)
"  I went to Urgent Care about my ankle.  They said I have an old injury to the tendon around my ankle.  They said I need to have it treated by a Podiatrist.  I have seen Dr. Jacqualyn Posey most recently and I have seen Dr. Paulla Dolly too.  Who should I tell them to refer me to?"  Both of them do surgeries for that type of problem.  "I guess I will tell them to put me with Dr. Jacqualyn Posey since he's the one I saw most recently."

## 2017-09-11 DIAGNOSIS — J449 Chronic obstructive pulmonary disease, unspecified: Secondary | ICD-10-CM | POA: Diagnosis not present

## 2017-09-19 ENCOUNTER — Other Ambulatory Visit: Payer: Self-pay | Admitting: Podiatry

## 2017-09-19 ENCOUNTER — Ambulatory Visit (INDEPENDENT_AMBULATORY_CARE_PROVIDER_SITE_OTHER): Payer: PPO | Admitting: Podiatry

## 2017-09-19 ENCOUNTER — Encounter: Payer: Self-pay | Admitting: Podiatry

## 2017-09-19 ENCOUNTER — Ambulatory Visit: Payer: PPO

## 2017-09-19 DIAGNOSIS — R52 Pain, unspecified: Secondary | ICD-10-CM

## 2017-09-19 DIAGNOSIS — M19079 Primary osteoarthritis, unspecified ankle and foot: Secondary | ICD-10-CM | POA: Diagnosis not present

## 2017-09-19 DIAGNOSIS — R609 Edema, unspecified: Secondary | ICD-10-CM

## 2017-09-19 MED ORDER — MELOXICAM 15 MG PO TABS: 15.0000 mg | ORAL_TABLET | Freq: Every day | ORAL | 0 refills | Status: DC

## 2017-09-19 NOTE — Progress Notes (Signed)
This patient presents the office for an evaluation of her left foot and ankle.  She says that approximately 3 days ago she woke up and sure left foot and ankle was swollen twice its normal size.  She says that she was unable to bear weight on her foot due to the pain in the swelling.  She was evaluated at the urgent care, which then centered to the emergency room.  The emergency room determined she had no history of a DVT but may possibly have a skin infection  . She was also thought to have a possible gout attack in her left ankle.  She presents the office today stating that she does have mild swelling but no pain is present in her left ankle or foot.  She was also told that she may have an old tendon injury and was to follow-up with podiatrist for an evaluation.   She presents the office today for an evaluation of this left foot and ankle   GENERAL APPEARANCE: Alert, conversant. Appropriately groomed. No acute distress.  VASCULAR: Pedal pulses are  palpable at  [facility] and PT bilateral.  Capillary refill time is immediate to all digits,  Normal temperature gradient.  Digital hair growth is present bilateral  NEUROLOGIC: sensation is normal to 5.07 monofilament at 5/5 sites bilateral.  Light touch is intact bilateral, Muscle strength normal.  MUSCULOSKELETAL: acceptable muscle strength, tone and stability bilateral.  Intrinsic muscluature intact bilateral.  Rectus appearance of foot and digits noted bilateral. Muscle power was found to be within normal limits on the right foot.  Patient had normal range of motion of the tendons of the left foot except upon dorsiflexion which is mildly diminished.  No palpable pain noted through the ankle of the left foot.  No pain was noted along the course of the posterior tibial tendon or the peroneal tendon of the left foot.    DERMATOLOGIC: skin color, texture, and turgor are within normal limits.  No preulcerative lesions or ulcers  are seen, no interdigital maceration  noted.  No open lesions present.  Digital nails are asymptomatic. No drainage noted.  Ankle arthritis left   IE  Xray revealing no evidence of any bony pathology.  Patient presents to the office today having no pain or discomfort and only minimal swelling which she says is related to her previous painful episode.  She has normal range of motion and no palpable pain noted to her ankle  . No evidence of any cellulitis or gout inflammation. No evidence of any tendon.  Patient has no evidence of any pathology and was told to return to the office if the problem returns  . In the meantime, I recommended that she wear an anklet on her left ankle.  I prescribed Mobic to be taken when necessary   Gardiner Barefoot DPM..

## 2017-10-11 DIAGNOSIS — J449 Chronic obstructive pulmonary disease, unspecified: Secondary | ICD-10-CM | POA: Diagnosis not present

## 2017-10-19 ENCOUNTER — Emergency Department: Payer: PPO

## 2017-10-19 ENCOUNTER — Emergency Department
Admission: EM | Admit: 2017-10-19 | Discharge: 2017-10-19 | Disposition: A | Discharge status: Home / Self Care | Payer: PPO | Attending: Emergency Medicine | Admitting: Emergency Medicine

## 2017-10-19 DIAGNOSIS — I129 Hypertensive chronic kidney disease with stage 1 through stage 4 chronic kidney disease, or unspecified chronic kidney disease: Secondary | ICD-10-CM | POA: Diagnosis not present

## 2017-10-19 DIAGNOSIS — R1012 Left upper quadrant pain: Secondary | ICD-10-CM | POA: Diagnosis not present

## 2017-10-19 DIAGNOSIS — E1122 Type 2 diabetes mellitus with diabetic chronic kidney disease: Secondary | ICD-10-CM | POA: Insufficient documentation

## 2017-10-19 DIAGNOSIS — K5730 Diverticulosis of large intestine without perforation or abscess without bleeding: Secondary | ICD-10-CM | POA: Diagnosis not present

## 2017-10-19 DIAGNOSIS — Z79899 Other long term (current) drug therapy: Secondary | ICD-10-CM | POA: Diagnosis not present

## 2017-10-19 DIAGNOSIS — N183 Chronic kidney disease, stage 3 (moderate): Secondary | ICD-10-CM | POA: Diagnosis not present

## 2017-10-19 DIAGNOSIS — R109 Unspecified abdominal pain: Secondary | ICD-10-CM | POA: Diagnosis not present

## 2017-10-19 DIAGNOSIS — Z87891 Personal history of nicotine dependence: Secondary | ICD-10-CM | POA: Insufficient documentation

## 2017-10-19 DIAGNOSIS — J449 Chronic obstructive pulmonary disease, unspecified: Secondary | ICD-10-CM | POA: Diagnosis not present

## 2017-10-19 LAB — URINALYSIS, COMPLETE (UACMP) WITH MICROSCOPIC
BACTERIA UA: NONE SEEN
BILIRUBIN URINE: NEGATIVE
Glucose, UA: NEGATIVE mg/dL
HGB URINE DIPSTICK: NEGATIVE
KETONES UR: NEGATIVE mg/dL
NITRITE: NEGATIVE
PROTEIN: NEGATIVE mg/dL
Specific Gravity, Urine: 1.008 (ref 1.005–1.030)
Squamous Epithelial / LPF: NONE SEEN
pH: 5 (ref 5.0–8.0)

## 2017-10-19 LAB — COMPREHENSIVE METABOLIC PANEL
ALBUMIN: 4.1 g/dL (ref 3.5–5.0)
ALT: 16 U/L (ref 14–54)
AST: 17 U/L (ref 15–41)
Alkaline Phosphatase: 68 U/L (ref 38–126)
Anion gap: 9 (ref 5–15)
BUN: 23 mg/dL — AB (ref 6–20)
CHLORIDE: 97 mmol/L — AB (ref 101–111)
CO2: 29 mmol/L (ref 22–32)
Calcium: 10.6 mg/dL — ABNORMAL HIGH (ref 8.9–10.3)
Creatinine, Ser: 1.46 mg/dL — ABNORMAL HIGH (ref 0.44–1.00)
GFR calc Af Amer: 39 mL/min — ABNORMAL LOW (ref >60)
GFR, EST NON AFRICAN AMERICAN: 33 mL/min — AB (ref >60)
GLUCOSE: 129 mg/dL — AB (ref 65–99)
POTASSIUM: 4.1 mmol/L (ref 3.5–5.1)
Sodium: 135 mmol/L (ref 135–145)
Total Bilirubin: 0.6 mg/dL (ref 0.3–1.2)
Total Protein: 7.4 g/dL (ref 6.5–8.1)

## 2017-10-19 LAB — CBC
HEMATOCRIT: 36.3 % (ref 35.0–47.0)
HEMOGLOBIN: 12 g/dL (ref 12.0–16.0)
MCH: 29.4 pg (ref 26.0–34.0)
MCHC: 33.1 g/dL (ref 32.0–36.0)
MCV: 88.8 fL (ref 80.0–100.0)
Platelets: 156 10*3/uL (ref 150–440)
RBC: 4.09 MIL/uL (ref 3.80–5.20)
RDW: 14.1 % (ref 11.5–14.5)
WBC: 11.2 10*3/uL — AB (ref 3.6–11.0)

## 2017-10-19 LAB — LIPASE, BLOOD: LIPASE: 21 U/L (ref 11–51)

## 2017-10-19 MED ORDER — ACETAMINOPHEN 500 MG PO TABS
ORAL_TABLET | ORAL | Status: AC
  Administered 2017-10-19: 500 mg via ORAL
  Filled 2017-10-19: qty 1

## 2017-10-19 MED ORDER — ACETAMINOPHEN 500 MG PO TABS
500.0000 mg | ORAL_TABLET | Freq: Once | ORAL | Status: AC
  Administered 2017-10-19: 500 mg via ORAL

## 2017-10-19 MED ORDER — SODIUM CHLORIDE 0.9 % IV BOLUS (SEPSIS): 1000.0000 mL | Freq: Once | INTRAVENOUS | Status: DC

## 2017-10-19 NOTE — ED Provider Notes (Signed)
[facility] Emergency Department Provider Note  ____________________________________________  Time seen: Approximately 8:07 PM  I have reviewed the triage vital signs and the nursing notes.   HISTORY  Chief Complaint Abdominal Pain   HPI [name] is a 76 y.o. female with a history of COPD on 3 L [place], hypertension, hyperlipidemia, and diabetes who presents for evaluation of left abdominal pain. Patient reports that earlier today she developed sudden onset of severe sharp left flank/left upper quadrant abdominal pain. She reports that she took a nap and the pain woke her up from her sleep. She took 500 mg of Tylenol and currently her pain is 3 out of 10. No nausea, vomiting, fever, chills, diarrhea, constipation, chest pain, shortness of breath, dysuria or hematuria, no cough or congestion. Patient denies ever having similar pain. No prior abdominal surgeries. No prior history of kidney stones.  Past Medical History:  Diagnosis Date  . COPD (chronic obstructive pulmonary disease) ([place])   . Environmental allergies   . High blood pressure   . High cholesterol     Patient Active Problem List   Diagnosis Date Noted  . Onychomycosis 09/20/2016  . Health care maintenance 06/19/2016  . Back pain 05/12/2015  . Fatigue 10/13/2014  . Major depression in remission ([place]) 10/10/2014  . Type 2 diabetes mellitus with stage 3 chronic kidney disease ([place]) 06/08/2014  . Edema 05/29/2014  . HTN (hypertension), benign 05/29/2014  . Hyperlipidemia associated with type 2 diabetes mellitus ([place]) 05/29/2014  . UI (urinary incontinence) 05/29/2014  . Dyspnea 04/26/2014  . COPD (chronic obstructive pulmonary disease) ([place]) 03/25/2014  . Chronic respiratory failure ([place]) 03/25/2014    Past Surgical History:  Procedure Laterality Date  . BREAST LUMPECTOMY Bilateral 1962 (L) 2002(R)  . VESICOVAGINAL FISTULA CLOSURE W/ TAH  1975    Prior to Admission medications     Medication Sig Start Date End Date Taking? Authorizing Provider  albuterol (PROAIR HFA) 108 (90 Base) MCG/ACT inhaler Inhale 2 puffs into the lungs every 6 (six) hours as needed for wheezing or shortness of breath. 02/21/17   Wilhelmina Mcardle, MD  atorvastatin (LIPITOR) 20 MG tablet Take 20 mg by mouth daily.    [provider]  carvedilol (COREG) 6.25 MG tablet Take 6.25 mg by mouth 2 (two) times daily with a meal.    [provider]  cephALEXin (KEFLEX) 500 MG capsule Take 1 capsule (500 mg total) by mouth 2 (two) times daily. 09/03/17   Harvest Dark, MD  citalopram (CELEXA) 20 MG tablet Take 20 mg by mouth daily.    [provider]  Fluticasone-Salmeterol (ADVAIR) 250-50 MCG/DOSE AEPB Inhale 1 puff into the lungs 2 (two) times daily. 05/03/17   Wilhelmina Mcardle, MD  meloxicam (MOBIC) 15 MG tablet Take 1 tablet (15 mg total) by mouth daily. 09/19/17   Gardiner Barefoot, DPM  NONFORMULARY OR COMPOUNDED [place]:  Onychomycosis Nail Lacquer - Fluconazole 2%, Terbinafine 1%, DMSO apply to affected area daily. 09/28/16   Trula Slade, DPM  potassium chloride (MICRO-K) 10 MEQ CR capsule Take 10 mEq by mouth daily.    [provider]  tiotropium (SPIRIVA HANDIHALER) 18 MCG inhalation capsule Inhale the contents of one capsule once a day via handihaler device as directed 05/03/17   Wilhelmina Mcardle, MD  torsemide (DEMADEX) 20 MG tablet Take 20 mg by mouth daily.    [provider]    Allergies Mucinex d [pseudoephedrine-guaifenesin er]  Family  History  Problem Relation Age of Onset  . Heart disease Father   . Rheum arthritis Mother   . Cancer Mother        breast  . Heart disease Brother        2 brothers  . Cancer Maternal Aunt        cervical  . Cancer Maternal Aunt        stomach    Social History Social History  Substance Use Topics  . Smoking status: Former Smoker    Packs/day: 1.00    Years: 45.00    Types: Cigarettes     Quit date: 07/03/2013  . Smokeless tobacco: Never Used  . Alcohol use 0.0 oz/week     Comment: occasional glass of wine    Review of Systems  Constitutional: Negative for fever. Eyes: Negative for visual changes. ENT: Negative for sore throat. Neck: No neck pain  Cardiovascular: Negative for chest pain. Respiratory: Negative for shortness of breath. Gastrointestinal: + L sided abdominal pain. No vomiting or diarrhea. Genitourinary: Negative for dysuria. Musculoskeletal: Negative for back pain. Skin: Negative for rash. Neurological: Negative for headaches, weakness or numbness. Psych: No SI or HI  ____________________________________________   PHYSICAL EXAM:  VITAL SIGNS: ED Triage Vitals  Enc Vitals Group     BP 10/19/17 1749 124/66     Pulse Rate 10/19/17 1749 83     Resp 10/19/17 1749 18     Temp 10/19/17 1749 98.8 F (37.1 C)     Temp Source 10/19/17 1749 Oral     SpO2 10/19/17 1749 96 %     Weight 10/19/17 1747 185 lb (83.9 kg)     Height 10/19/17 1747 5\' 3"  (1.6 m)     Head Circumference --      Peak Flow --      Pain Score 10/19/17 1946 3     Pain Loc --      Pain Edu? --      Excl. in [place]? --     Constitutional: Alert and oriented. Well appearing and in no apparent distress. HEENT:      Head: Normocephalic and atraumatic.         Eyes: Conjunctivae are normal. Sclera is non-icteric.       Mouth/Throat: Mucous membranes are moist.       Neck: Supple with no signs of meningismus. Cardiovascular: Regular rate and rhythm. No murmurs, gallops, or rubs. 2+ symmetrical distal pulses are present in all extremities. No JVD. Respiratory: Normal respiratory effort. Lungs are clear to auscultation bilaterally. No wheezes, crackles, or rhonchi.  Gastrointestinal: Soft, L sided tenderness to palpation, and non distended with positive bowel sounds. No rebound or guarding. Genitourinary: L CVA tenderness. Musculoskeletal: Nontender with normal range of motion in all  extremities. No edema, cyanosis, or erythema of extremities. Neurologic: Normal speech and language. Face is symmetric. Moving all extremities. No gross focal neurologic deficits are appreciated. Skin: Skin is warm, dry and intact. No rash noted. Psychiatric: Mood and affect are normal. Speech and behavior are normal.  ____________________________________________   LABS (all labs ordered are listed, but only abnormal results are displayed)  Labs Reviewed  COMPREHENSIVE METABOLIC PANEL - Abnormal; Notable for the following:       Result Value   Chloride 97 (*)    Glucose, Bld 129 (*)    BUN 23 (*)    Creatinine, Ser 1.46 (*)    Calcium 10.6 (*)    GFR calc non Af Wyvonnia Lora  33 (*)    GFR calc Af Amer 39 (*)    All other components within normal limits  CBC - Abnormal; Notable for the following:    WBC 11.2 (*)    All other components within normal limits  URINALYSIS, COMPLETE (UACMP) WITH MICROSCOPIC - Abnormal; Notable for the following:    Color, Urine STRAW (*)    APPearance CLEAR (*)    Leukocytes, UA MODERATE (*)    All other components within normal limits  URINE CULTURE  LIPASE, BLOOD   ____________________________________________  EKG  ED ECG REPORT I, Rudene Re, the attending physician, personally viewed and interpreted this ECG.  Normal sinus rhythm, rate of 78, normal intervals, normal axis, no ST elevations or depressions. T-wave inversion in aVL.unchanged from prior from 2014 ____________________________________________  RADIOLOGY  CT a/p: 1. No acute process demonstrated in the abdomen or pelvis. 2. No evidence of bowel obstruction or inflammation. 3. Diverticulosis of the sigmoid colon without evidence of diverticulitis. 4. Cholelithiasis without evidence of cholecystitis. 5. Aortic atherosclerosis.  ____________________________________________   PROCEDURES  Procedure(s) performed: None Procedures Critical Care performed:   None ____________________________________________   INITIAL IMPRESSION / ASSESSMENT AND PLAN / ED COURSE  76 y.o. female with a history of COPD on 3 L [place], hypertension, hyperlipidemia, and diabetes who presents for evaluation of sudden onset sharp severe left abdominal pain currently 3/10 after tylenol at home. patient is well-appearing, in no distress, she has normal vital signs, she has tenderness to palpation on the left abdominal quadrants and also on her left flank. UA with moderate leukocytes and 6-30 WBCs but no bacteria or nitrite. Patient has no symptoms of UTI, culture is pending.  No blood. Will hold off on abx until culture is back. CMP showing a stable creatinine, normal LFTs and lipase. WBCs slightly elevated at 11.2. Differential diagnoses including kidney stones versus diverticular disease. CTs pending.    _________________________ 10:01 PM on 10/19/2017 ----------------------------------------- CT abdomen and pelvis with no acute findings. Patient's pain is fully gone and serial abdominal exams are benign with no tenderness. Unclear etiology of her pain at this time. Recommend close follow-up with primary care doctor or return to the emergency room if the pain returns over the weekend.    As part of my medical decision making, I reviewed the following data within the [place] History obtained from family, Nursing notes reviewed and incorporated, Labs reviewed , EKG interpreted , Old EKG reviewed, Radiograph reviewed , Notes from prior ED visits and  Controlled Substance Database    Pertinent labs & imaging results that were available during my care of the patient were reviewed by me and considered in my medical decision making (see chart for details).    ____________________________________________   FINAL CLINICAL IMPRESSION(S) / ED DIAGNOSES  Final diagnoses:  Abdominal pain, unspecified abdominal location      NEW MEDICATIONS STARTED DURING  THIS VISIT:  New Prescriptions   No medications on file     Note:  This document was prepared using Dragon voice recognition software and may include unintentional dictation errors.    Rudene Re, MD 10/19/17 2204

## 2017-10-19 NOTE — ED Notes (Signed)

## 2017-10-19 NOTE — Discharge Instructions (Signed)

## 2017-10-19 NOTE — ED Triage Notes (Signed)
Pt came to ED via pov c/o left sided abdominal pain. Pt on 3 L o2 normally, reports no changes in her breathing from normal. History of copd.

## 2017-10-19 NOTE — ED Notes (Signed)
Patient transported to CT 

## 2017-10-21 LAB — URINE CULTURE

## 2017-10-25 ENCOUNTER — Telehealth: Payer: Self-pay | Admitting: Pulmonary Disease

## 2017-10-25 NOTE — Telephone Encounter (Signed)
Called patient to triage sx. Patient says it's her lungs and not heart. She declined appointment today with Dr. Juanell Fairly. She prefers to see Dr. Alva Garnet. Patient states she will call back should sx get worse. Patient aware to go to ED or urgent care over weekend if sx worsen. Nothing further needed.

## 2017-10-25 NOTE — Telephone Encounter (Signed)
Patient having L side pain in chest area when taking a deep (intermittant) wants to be seen sooner than recall in February    Offered sooner acute visit with another provider for today patient declined she is not prepared for an appt today   Scheduled 11/6 with [facility]

## 2017-10-28 ENCOUNTER — Ambulatory Visit: Payer: PPO | Admitting: Podiatry

## 2017-10-28 ENCOUNTER — Ambulatory Visit (INDEPENDENT_AMBULATORY_CARE_PROVIDER_SITE_OTHER): Payer: PPO | Admitting: Podiatry

## 2017-11-05 ENCOUNTER — Encounter: Payer: Self-pay | Admitting: Pulmonary Disease

## 2017-11-05 ENCOUNTER — Ambulatory Visit
Admission: RE | Admit: 2017-11-05 | Discharge: 2017-11-05 | Disposition: A | Discharge status: Home / Self Care | Payer: PPO | Source: Ambulatory Visit | Attending: Pulmonary Disease | Admitting: Pulmonary Disease

## 2017-11-05 ENCOUNTER — Ambulatory Visit: Payer: PPO | Admitting: Pulmonary Disease

## 2017-11-05 VITALS — BP 140/76 | HR 71 | Ht 63.0 in | Wt 186.0 lb

## 2017-11-05 DIAGNOSIS — J449 Chronic obstructive pulmonary disease, unspecified: Secondary | ICD-10-CM

## 2017-11-05 DIAGNOSIS — J9611 Chronic respiratory failure with hypoxia: Secondary | ICD-10-CM

## 2017-11-05 DIAGNOSIS — R918 Other nonspecific abnormal finding of lung field: Secondary | ICD-10-CM | POA: Diagnosis not present

## 2017-11-05 DIAGNOSIS — R0609 Other forms of dyspnea: Secondary | ICD-10-CM

## 2017-11-05 DIAGNOSIS — R079 Chest pain, unspecified: Secondary | ICD-10-CM | POA: Diagnosis not present

## 2017-11-05 NOTE — Patient Instructions (Signed)
Continue Advair, Spiriva, albuterol Continue oxygen therapy Chest x-ray today Follow-up in 2-3 months with pulmonary function tests prior to that visit

## 2017-11-07 NOTE — Progress Notes (Signed)
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  Mild obesity  DATA: Spirometry 2014: FEV1 0.78 liters (38% pred)  CT chest 07/06/13: mod - severe emphysema. Chronic RLL scarring CXR 05/12/15: Advanced chronic obstructive pulmonary disease with similar right basilar scarring. No acute findings CXR 04/04/17: No acute findings   INTERVAL HISTORY: Seen in ED 10/20 with pleuritic type L sided CP, now resolved  SUBJ: This is an early reevaluation due to the ED encounter noted above.  She continues to report gradually progressive worsening dyspnea -class III/IV.  She also reports lower extremity weakness and generalized fatigue.  She achieves little benefit from albuterol when used.  She remains compliant with meds (Advair, Spiriva) and O2. She recently increased her oxygen flow rate to 3 LPM as her SPO2 on home pulse oximetry was below 90%. She rarely uses albuterol rescue inhaler. Denies CP, fever, purulent sputum, hemoptysis, LE edema and calf tenderness.  OBJ: Vitals:   11/05/17 1155 11/05/17 1159  BP:  140/76  Pulse:  71  SpO2:  90%  Weight: 84.4 kg (186 lb)   Height: 5\' 3"  (1.6 m)   3 LPM [name] (pulse)  NAD  HEENT WNL No JVD BS markedly diminished, no wheezes Reg, no M Mildly obese, NABS, soft Minimal symmetric ankle edema  DATA: CXR (11/05/17): vague opacity in lingula  IMPRESSION: Very severe COPD - predominantly emphysema Chronic hypoxemic respiratory failure Worsening DOE Recent episode of L sided pleuritic CP  PLAN: Continue Advair, Spiriva, albuterol Continue oxygen therapy Chest x-ray today - results noted above Follow-up in 2-3 months with pulmonary function tests prior to that visit  Merton Border, MD PCCM service Mobile [phone] Pager [phone]  11/07/2017 3:38 PM

## 2017-11-11 DIAGNOSIS — J449 Chronic obstructive pulmonary disease, unspecified: Secondary | ICD-10-CM | POA: Diagnosis not present

## 2017-12-11 DIAGNOSIS — J449 Chronic obstructive pulmonary disease, unspecified: Secondary | ICD-10-CM | POA: Diagnosis not present

## 2017-12-12 DIAGNOSIS — N183 Chronic kidney disease, stage 3 (moderate): Secondary | ICD-10-CM | POA: Diagnosis not present

## 2017-12-12 DIAGNOSIS — E785 Hyperlipidemia, unspecified: Secondary | ICD-10-CM | POA: Diagnosis not present

## 2017-12-12 DIAGNOSIS — E1169 Type 2 diabetes mellitus with other specified complication: Secondary | ICD-10-CM | POA: Diagnosis not present

## 2017-12-12 DIAGNOSIS — E1122 Type 2 diabetes mellitus with diabetic chronic kidney disease: Secondary | ICD-10-CM | POA: Diagnosis not present

## 2017-12-12 DIAGNOSIS — J42 Unspecified chronic bronchitis: Secondary | ICD-10-CM | POA: Diagnosis not present

## 2017-12-12 DIAGNOSIS — I10 Essential (primary) hypertension: Secondary | ICD-10-CM | POA: Diagnosis not present

## 2017-12-20 DIAGNOSIS — E785 Hyperlipidemia, unspecified: Secondary | ICD-10-CM | POA: Diagnosis not present

## 2017-12-20 DIAGNOSIS — N183 Chronic kidney disease, stage 3 (moderate): Secondary | ICD-10-CM | POA: Diagnosis not present

## 2017-12-20 DIAGNOSIS — F325 Major depressive disorder, single episode, in full remission: Secondary | ICD-10-CM | POA: Diagnosis not present

## 2017-12-20 DIAGNOSIS — I10 Essential (primary) hypertension: Secondary | ICD-10-CM | POA: Diagnosis not present

## 2017-12-20 DIAGNOSIS — E1122 Type 2 diabetes mellitus with diabetic chronic kidney disease: Secondary | ICD-10-CM | POA: Diagnosis not present

## 2017-12-20 DIAGNOSIS — J42 Unspecified chronic bronchitis: Secondary | ICD-10-CM | POA: Diagnosis not present

## 2017-12-20 DIAGNOSIS — E1169 Type 2 diabetes mellitus with other specified complication: Secondary | ICD-10-CM | POA: Diagnosis not present

## 2018-01-11 DIAGNOSIS — J449 Chronic obstructive pulmonary disease, unspecified: Secondary | ICD-10-CM | POA: Diagnosis not present

## 2018-01-21 ENCOUNTER — Telehealth: Payer: Self-pay | Admitting: Pulmonary Disease

## 2018-01-21 MED ORDER — DOXYCYCLINE HYCLATE 100 MG PO TABS: 100.0000 mg | ORAL_TABLET | Freq: Two times a day (BID) | ORAL | 0 refills | Status: AC

## 2018-01-21 MED ORDER — DOXYCYCLINE HYCLATE 100 MG PO TABS: 100.0000 mg | ORAL_TABLET | Freq: Two times a day (BID) | ORAL | 0 refills | Status: DC

## 2018-01-21 NOTE — Telephone Encounter (Signed)
Pt requested RX be sent to [facility] in [place]. Resent Doxycycline. Called [facility] and cancelled RX. Nothing further needed.

## 2018-01-21 NOTE — Telephone Encounter (Signed)
Pt states that she has a bad cold and needs an rx. Please call.

## 2018-01-21 NOTE — Telephone Encounter (Signed)
Doxycycline 100 mg BID X 5 days ordered  [name]

## 2018-01-21 NOTE — Telephone Encounter (Signed)
Pt states she has Prod cough w/white to yellow mucus, runny nose. Please advise.

## 2018-02-02 IMAGING — CT CT RENAL STONE PROTOCOL
2 of 4 series · 16 of 46 positions shown, 18 images · non-contrast
Comparison: 10/09/2005

CLINICAL DATA: Left-sided abdominal pain.

EXAM:
CT ABDOMEN AND PELVIS WITHOUT CONTRAST
TECHNIQUE: Multidetector CT imaging of the abdomen and pelvis was performed
following the standard protocol without IV contrast.

[Series 2: stone full standard · axial · 0.70mm/px · z∈[+39,+444]mm · 13 of 89 slices shown, 15 images]
[im 4/89  soft-tissue]
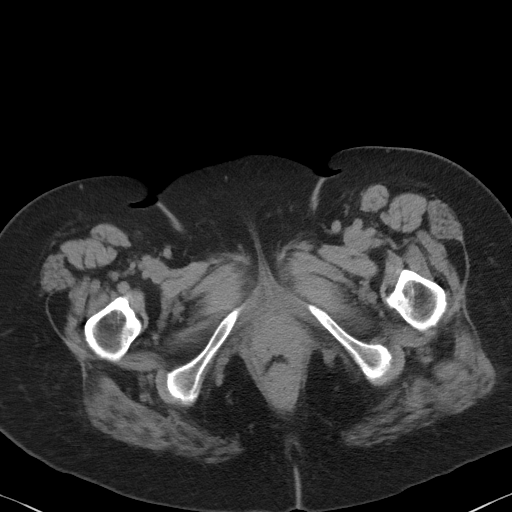
[im 4/89  bone]
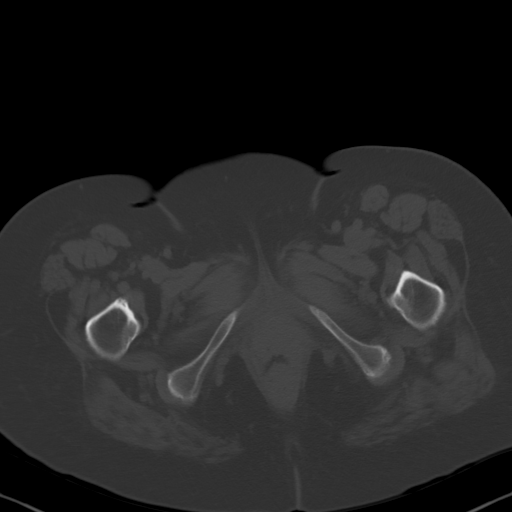
[im 11/89  soft-tissue]
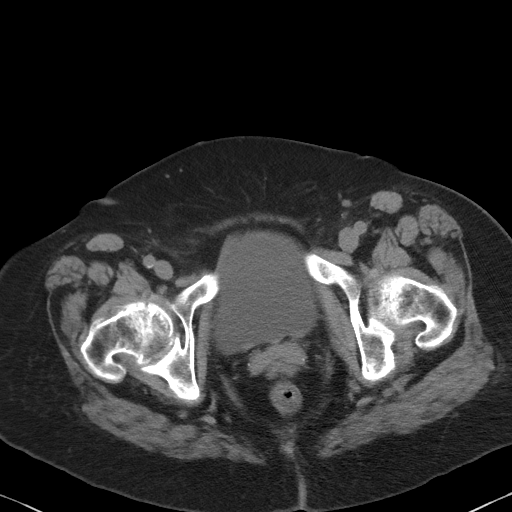
[im 18/89  soft-tissue]
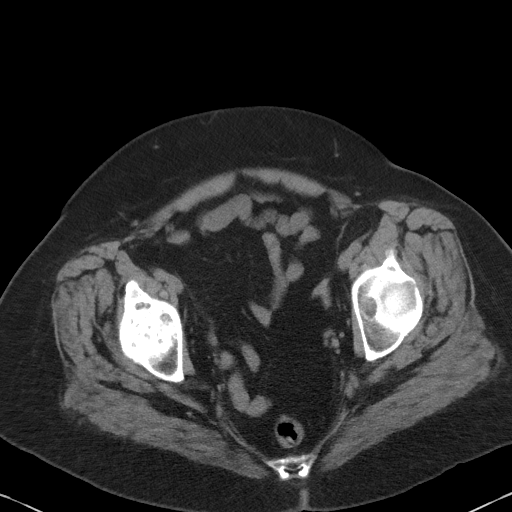
[im 25/89  soft-tissue]
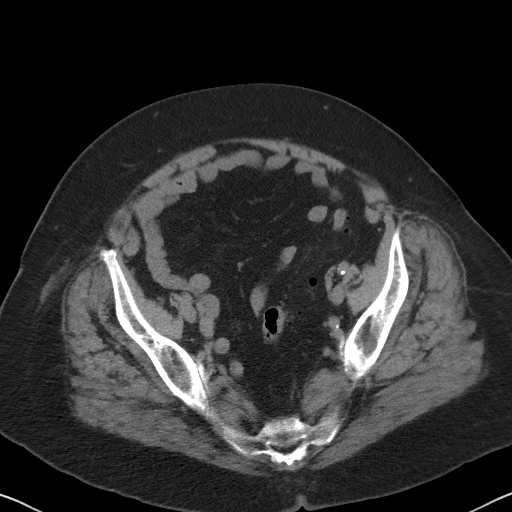
[im 32/89  soft-tissue]
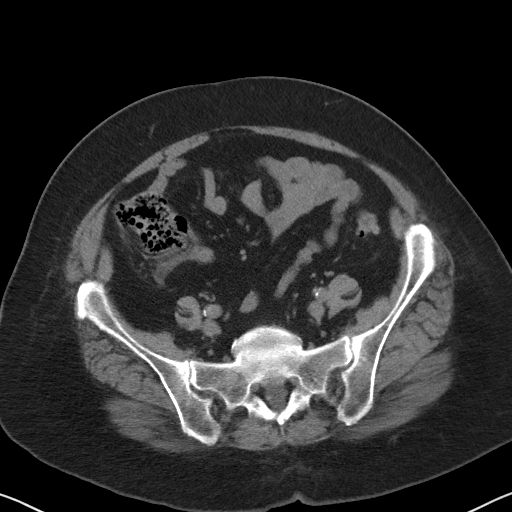
[im 39/89  soft-tissue]
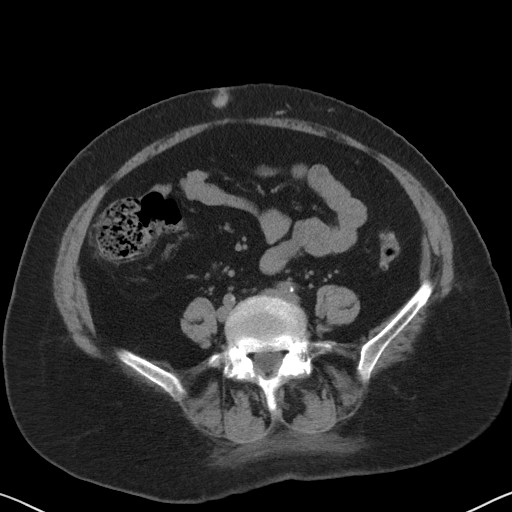
[im 46/89  soft-tissue]
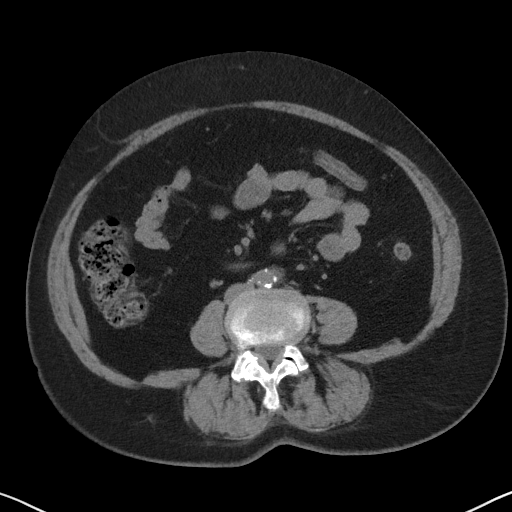
[im 50/89  soft-tissue]
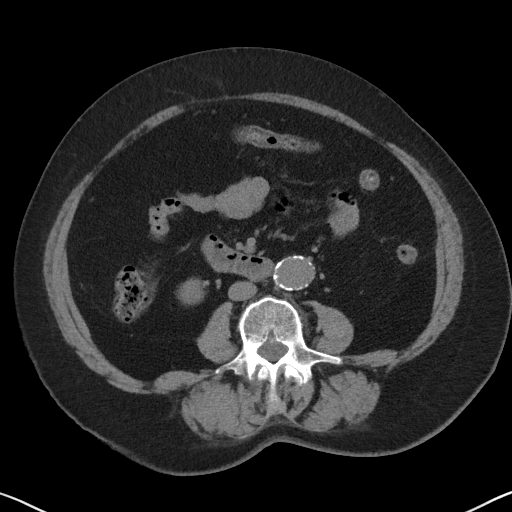
[im 57/89  soft-tissue]
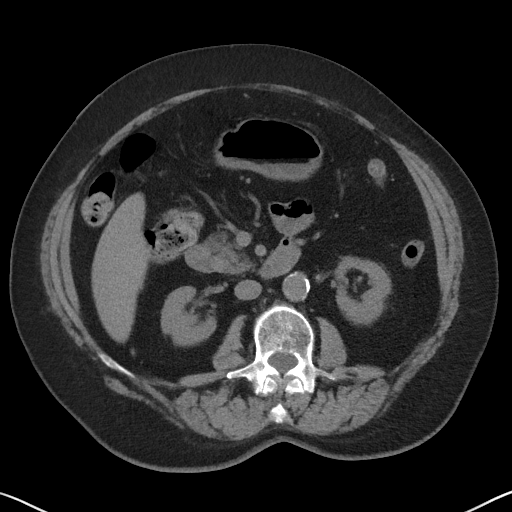
[im 57/89  bone]
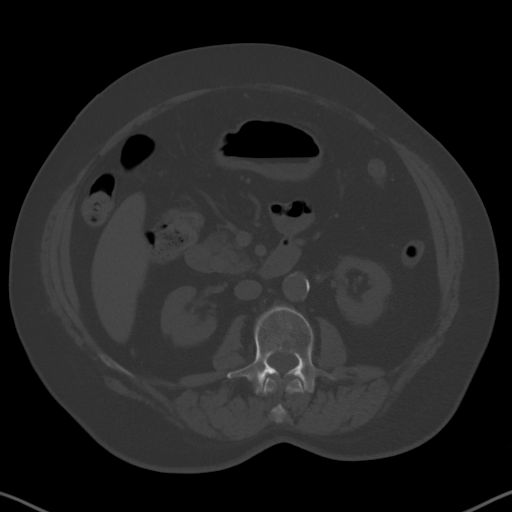
[im 64/89  soft-tissue]
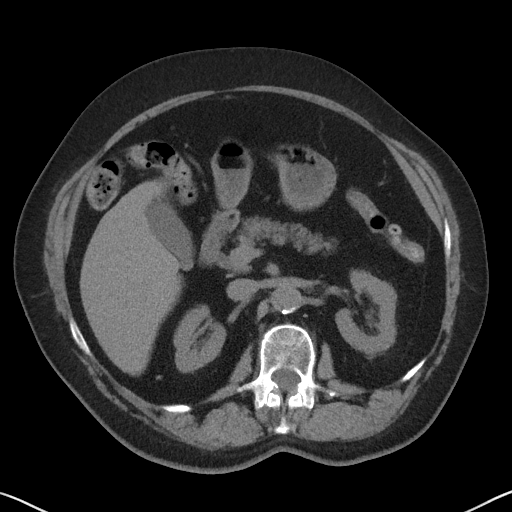
[im 71/89  soft-tissue]
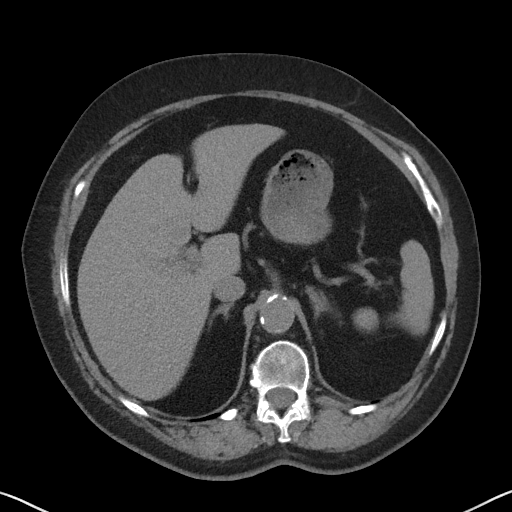
[im 78/89  soft-tissue]
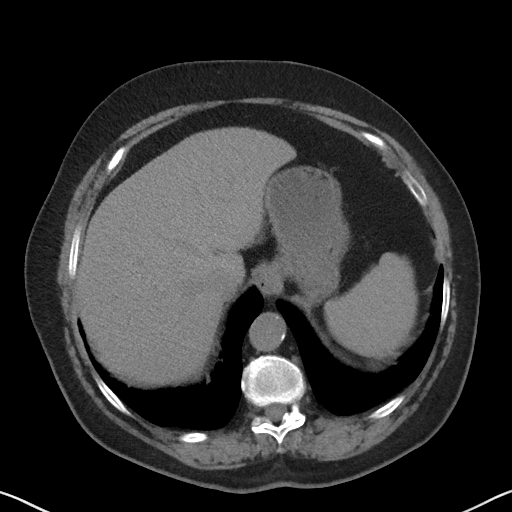
[im 85/89  soft-tissue]
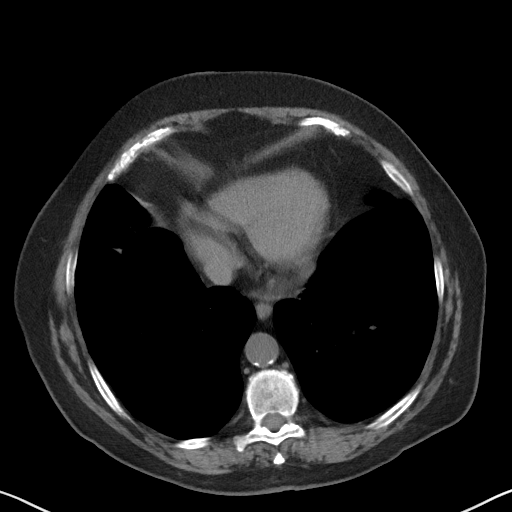

[Series 5: coronal · coronal · 0.67mm/px · 3 of 151 slices shown]
[im 51/151  soft-tissue]
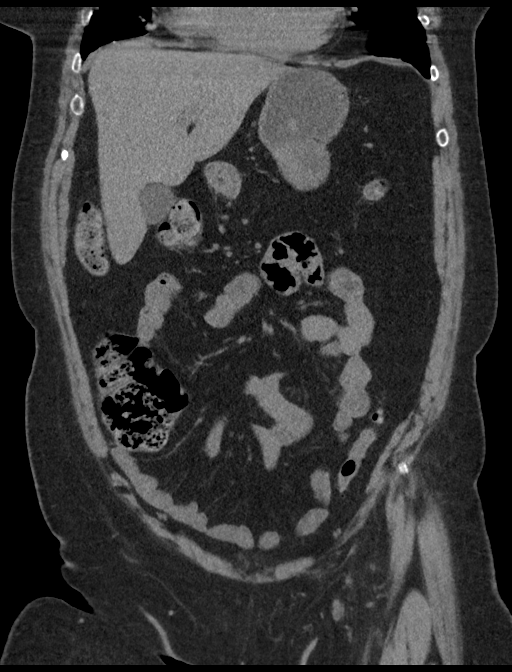
[im 67/151  soft-tissue]
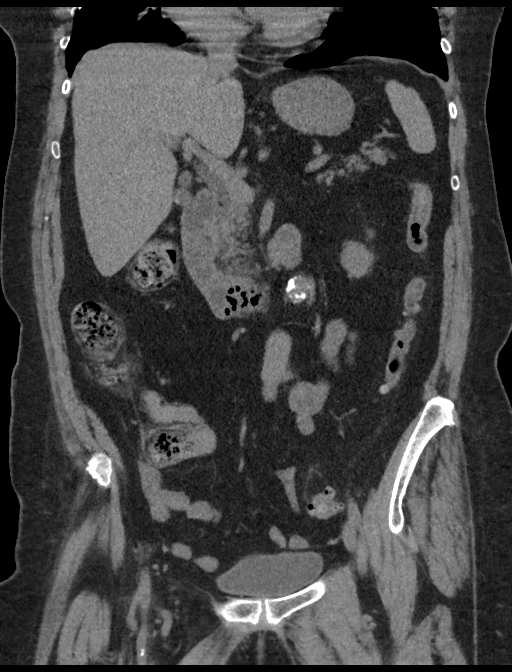
[im 84/151  soft-tissue]
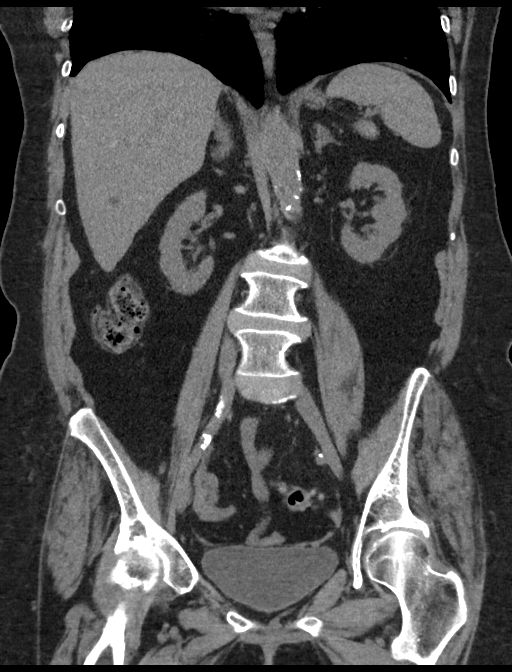

[16 of 46 positions shown; findings below may reference images not displayed]

FINDINGS: Lower chest: Lung bases are clear, lying for motion artifact.
Coronary artery calcifications.

Hepatobiliary: Small cysts in segments 2 and 6. No other focal
lesions identified. Cholelithiasis with small stone in the
gallbladder. No gallbladder wall thickening or infiltration. No bile
duct dilatation.

Pancreas: Unremarkable. No pancreatic ductal dilatation or
surrounding inflammatory changes.

Spleen: Normal in size without focal abnormality.

Adrenals/Urinary Tract: Adrenal glands are unremarkable. Kidneys are
normal, without renal calculi, focal lesion, or hydronephrosis.
Bladder is unremarkable.

Stomach/Bowel: Stomach, small bowel, and colon are mostly
decompressed. Scattered stool throughout the colon. Sigmoid colonic
diverticulosis. No inflammatory changes to suggest diverticulitis.
Appendix is normal.

Vascular/Lymphatic: Focal abdominal aortic ectasia with maximal
diameter measuring 2.3 cm. Diffuse aortic calcification. No
significant lymphadenopathy.

Reproductive: Surgical absence of the uterus. No abnormal adnexal
masses.

Other: No free air or free fluid in the abdomen. Abdominal wall
musculature appears intact.

Musculoskeletal: Degenerative changes in the spine. No destructive
bone lesions.
IMPRESSION: 1. No acute process demonstrated in the abdomen or pelvis.
2. No evidence of bowel obstruction or inflammation.
3. Diverticulosis of the sigmoid colon without evidence of
diverticulitis.
4. Cholelithiasis without evidence of cholecystitis.
5. Aortic atherosclerosis.

## 2018-02-11 ENCOUNTER — Ambulatory Visit: Payer: PPO | Attending: Pulmonary Disease

## 2018-02-11 DIAGNOSIS — J449 Chronic obstructive pulmonary disease, unspecified: Secondary | ICD-10-CM | POA: Insufficient documentation

## 2018-02-11 MED ORDER — ALBUTEROL SULFATE (2.5 MG/3ML) 0.083% IN NEBU
2.5000 mg | INHALATION_SOLUTION | Freq: Once | RESPIRATORY_TRACT | Status: AC
  Filled 2018-02-11: qty 3

## 2018-02-26 ENCOUNTER — Ambulatory Visit: Payer: PPO | Admitting: Pulmonary Disease

## 2018-03-04 ENCOUNTER — Ambulatory Visit: Payer: PPO | Admitting: Pulmonary Disease

## 2018-03-04 ENCOUNTER — Encounter: Payer: Self-pay | Admitting: Pulmonary Disease

## 2018-03-04 VITALS — BP 120/64 | HR 71 | Ht 63.0 in | Wt 185.0 lb

## 2018-03-04 DIAGNOSIS — J9611 Chronic respiratory failure with hypoxia: Secondary | ICD-10-CM

## 2018-03-04 DIAGNOSIS — J449 Chronic obstructive pulmonary disease, unspecified: Secondary | ICD-10-CM | POA: Diagnosis not present

## 2018-03-04 NOTE — Patient Instructions (Signed)
Continue Advair and Spiriva as maintenance medications Continue albuterol inhaler as needed for increased chest tightness, wheezing, shortness of breath, cough Continue oxygen therapy is close to 24 hours/day as possible Follow-up in 4-6 months or sooner as needed

## 2018-03-06 NOTE — Progress Notes (Signed)
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  Mild obesity  DATA: Spirometry 2014: FEV1 0.78 liters (38% pred)  CT chest 07/06/13: mod - severe emphysema. Chronic RLL scarring CXR 05/12/15: Advanced chronic obstructive pulmonary disease with similar right basilar scarring. No acute findings CXR 04/04/17: No acute findings   INTERVAL HISTORY: No major events  SUBJ: This is a routine reevaluation.  Overall, she is essentially unchanged.  She believes she might have minimally gradual progressive dyspnea.  He remains on Advair, Spiriva, as needed albuterol, supplemental oxygen.  She continues to wear oxygen at 3 LPM by nasal cannula most of the time.  However, when she is out and about, she will use 2 LPM to conserve. Denies CP, fever, purulent sputum, hemoptysis, LE edema and calf tenderness.  OBJ: Vitals:   03/04/18 1105 03/04/18 1112  BP:  120/64  Pulse:  71  SpO2:  92%  Weight: 83.9 kg (185 lb)   Height: 5\' 3"  (1.6 m)   2 LPM [place] (pulse)  Gen: NAD HEENT: NCAT, sclera white Neck: No JVD noted Lungs: Moderately to severely diminished throughout without wheezes or other adventitious sounds Cardiovascular: RRR, no murmurs Abdomen: Soft, nontender, normal BS Ext: No edema noted Neuro: No focal deficits noted Skin: Limited exam, no lesions noted   DATA: CXR: No new film  IMPRESSION: Very severe COPD - predominantly emphysema Chronic hypoxemic respiratory failure  PLAN: Continue Advair, Spiriva maintenance inhalers Continue albuterol inhaler as needed for chest tightness, wheezing, shortness of breath, cough Continue oxygen therapy is close to 24 hours/day as possible Follow up in 4-6 months or sooner as needed  Merton Border, MD PCCM service Mobile [phone] Pager [phone]  03/06/2018 10:54 AM

## 2018-03-11 DIAGNOSIS — J449 Chronic obstructive pulmonary disease, unspecified: Secondary | ICD-10-CM | POA: Diagnosis not present

## 2018-03-13 ENCOUNTER — Encounter: Payer: Self-pay | Admitting: General Surgery

## 2018-03-14 ENCOUNTER — Encounter: Payer: Self-pay | Admitting: General Surgery

## 2018-03-18 ENCOUNTER — Telehealth: Payer: Self-pay | Admitting: Pulmonary Disease

## 2018-03-18 MED ORDER — ALBUTEROL SULFATE HFA 108 (90 BASE) MCG/ACT IN AERS: 2.0000 | INHALATION_SPRAY | Freq: Four times a day (QID) | RESPIRATORY_TRACT | 3 refills | Status: DC | PRN

## 2018-03-18 MED ORDER — TIOTROPIUM BROMIDE MONOHYDRATE 18 MCG IN CAPS: ORAL_CAPSULE | RESPIRATORY_TRACT | 3 refills | Status: DC

## 2018-03-18 NOTE — Telephone Encounter (Signed)
Pt calling asking for a new prescription sent in for Proair   She's needing them to be mailed to her so she may send to her pharmacy   Please advise

## 2018-03-18 NOTE — Telephone Encounter (Signed)
Pt will pick up RXs on Thursday. RX signed by provider and placed up front for pick up.

## 2018-04-11 DIAGNOSIS — J449 Chronic obstructive pulmonary disease, unspecified: Secondary | ICD-10-CM | POA: Diagnosis not present

## 2018-04-18 ENCOUNTER — Other Ambulatory Visit: Payer: Self-pay | Admitting: Internal Medicine

## 2018-04-18 ENCOUNTER — Telehealth: Payer: Self-pay | Admitting: Pulmonary Disease

## 2018-04-18 MED ORDER — PREDNISONE 10 MG (21) PO TBPK: ORAL_TABLET | ORAL | 0 refills | Status: DC

## 2018-04-18 NOTE — Telephone Encounter (Signed)
Called and spoke to pt.  Pt reports of nasal drainage yellow in color, chest congestion & prod cough with yellow mucus x2-3d Pt states she has not needed to use her albuterol inhaler/neb, but is taking spiriva daily and Advair bid.  DR please advise. Thanks

## 2018-04-18 NOTE — Telephone Encounter (Signed)
Pt calling stating for the past couple days she's have a stop up nose and congestion  Was asking if we could please call in something for her   Please advise

## 2018-04-18 NOTE — Telephone Encounter (Signed)
Pt is aware of below recommendations and voiced her understanding. Nothing further is needed.  

## 2018-04-18 NOTE — Telephone Encounter (Signed)
Sent in script for prednisone taper. She should use her albuterol neb three times daily until feeling better.

## 2018-05-11 DIAGNOSIS — J449 Chronic obstructive pulmonary disease, unspecified: Secondary | ICD-10-CM | POA: Diagnosis not present

## 2018-06-11 DIAGNOSIS — J449 Chronic obstructive pulmonary disease, unspecified: Secondary | ICD-10-CM | POA: Diagnosis not present

## 2018-06-18 DIAGNOSIS — E1122 Type 2 diabetes mellitus with diabetic chronic kidney disease: Secondary | ICD-10-CM | POA: Diagnosis not present

## 2018-06-18 DIAGNOSIS — E1169 Type 2 diabetes mellitus with other specified complication: Secondary | ICD-10-CM | POA: Diagnosis not present

## 2018-06-18 DIAGNOSIS — N183 Chronic kidney disease, stage 3 (moderate): Secondary | ICD-10-CM | POA: Diagnosis not present

## 2018-06-18 DIAGNOSIS — I10 Essential (primary) hypertension: Secondary | ICD-10-CM | POA: Diagnosis not present

## 2018-06-18 DIAGNOSIS — E785 Hyperlipidemia, unspecified: Secondary | ICD-10-CM | POA: Diagnosis not present

## 2018-06-25 DIAGNOSIS — Z0000 Encounter for general adult medical examination without abnormal findings: Secondary | ICD-10-CM | POA: Diagnosis not present

## 2018-06-25 DIAGNOSIS — N183 Chronic kidney disease, stage 3 (moderate): Secondary | ICD-10-CM | POA: Diagnosis not present

## 2018-06-25 DIAGNOSIS — J9611 Chronic respiratory failure with hypoxia: Secondary | ICD-10-CM | POA: Diagnosis not present

## 2018-06-25 DIAGNOSIS — J42 Unspecified chronic bronchitis: Secondary | ICD-10-CM | POA: Diagnosis not present

## 2018-06-25 DIAGNOSIS — E1169 Type 2 diabetes mellitus with other specified complication: Secondary | ICD-10-CM | POA: Diagnosis not present

## 2018-06-25 DIAGNOSIS — I10 Essential (primary) hypertension: Secondary | ICD-10-CM | POA: Diagnosis not present

## 2018-06-25 DIAGNOSIS — E1122 Type 2 diabetes mellitus with diabetic chronic kidney disease: Secondary | ICD-10-CM | POA: Diagnosis not present

## 2018-06-25 DIAGNOSIS — E785 Hyperlipidemia, unspecified: Secondary | ICD-10-CM | POA: Diagnosis not present

## 2018-06-25 DIAGNOSIS — F325 Major depressive disorder, single episode, in full remission: Secondary | ICD-10-CM | POA: Diagnosis not present

## 2018-07-09 DIAGNOSIS — R208 Other disturbances of skin sensation: Secondary | ICD-10-CM | POA: Diagnosis not present

## 2018-07-09 DIAGNOSIS — L538 Other specified erythematous conditions: Secondary | ICD-10-CM | POA: Diagnosis not present

## 2018-07-09 DIAGNOSIS — L820 Inflamed seborrheic keratosis: Secondary | ICD-10-CM | POA: Diagnosis not present

## 2018-07-09 DIAGNOSIS — R58 Hemorrhage, not elsewhere classified: Secondary | ICD-10-CM | POA: Diagnosis not present

## 2018-07-11 DIAGNOSIS — J449 Chronic obstructive pulmonary disease, unspecified: Secondary | ICD-10-CM | POA: Diagnosis not present

## 2018-07-17 ENCOUNTER — Telehealth: Payer: Self-pay | Admitting: Pulmonary Disease

## 2018-07-17 NOTE — Telephone Encounter (Signed)
zpak prednisone 20 mg for 10 days

## 2018-07-17 NOTE — Telephone Encounter (Signed)
Please advise on message below.

## 2018-07-17 NOTE — Telephone Encounter (Signed)
Pt states she has a terrible cough, trying to get mucus out of her lungs, and just does not feel well. Please call to discuss.

## 2018-07-18 MED ORDER — AZITHROMYCIN 250 MG PO TABS: ORAL_TABLET | ORAL | 0 refills | Status: DC

## 2018-07-18 MED ORDER — PREDNISONE 20 MG PO TABS: 20.0000 mg | ORAL_TABLET | Freq: Every day | ORAL | 0 refills | Status: DC

## 2018-07-18 NOTE — Telephone Encounter (Signed)
Pt informed Prednisone and ZPak sent to her pharmacy. Nothing further needed.

## 2018-08-11 DIAGNOSIS — J449 Chronic obstructive pulmonary disease, unspecified: Secondary | ICD-10-CM | POA: Diagnosis not present

## 2018-08-20 ENCOUNTER — Encounter: Payer: Self-pay | Admitting: Pulmonary Disease

## 2018-08-20 ENCOUNTER — Telehealth: Payer: Self-pay | Admitting: Pulmonary Disease

## 2018-08-20 ENCOUNTER — Ambulatory Visit: Payer: PPO | Admitting: Pulmonary Disease

## 2018-08-20 VITALS — BP 126/76 | HR 87 | Ht 63.0 in | Wt 184.0 lb

## 2018-08-20 DIAGNOSIS — J9611 Chronic respiratory failure with hypoxia: Secondary | ICD-10-CM

## 2018-08-20 DIAGNOSIS — J449 Chronic obstructive pulmonary disease, unspecified: Secondary | ICD-10-CM

## 2018-08-20 DIAGNOSIS — J410 Simple chronic bronchitis: Secondary | ICD-10-CM

## 2018-08-20 MED ORDER — AZITHROMYCIN 250 MG PO TABS: 250.0000 mg | ORAL_TABLET | Freq: Every day | ORAL | 5 refills | Status: DC

## 2018-08-20 MED ORDER — TIOTROPIUM BROMIDE MONOHYDRATE 2.5 MCG/ACT IN AERS: 2.0000 | INHALATION_SPRAY | Freq: Every day | RESPIRATORY_TRACT | 2 refills | Status: DC

## 2018-08-20 MED ORDER — TIOTROPIUM BROMIDE MONOHYDRATE 2.5 MCG/ACT IN AERS: 2.0000 | INHALATION_SPRAY | Freq: Every day | RESPIRATORY_TRACT | 0 refills | Status: DC

## 2018-08-20 MED ORDER — TIOTROPIUM BROMIDE MONOHYDRATE 2.5 MCG/ACT IN AERS: 2.0000 | INHALATION_SPRAY | Freq: Every day | RESPIRATORY_TRACT | 5 refills | Status: DC

## 2018-08-20 NOTE — Telephone Encounter (Signed)
°*  STAT* If patient is at the pharmacy, call can be transferred to refill team.   1. Which medications need to be refilled? (please list name of each medication and dose if known) spiriva 2.5 mcg /act inh 2 puffs q day   2. Which pharmacy/location (including street and city if local pharmacy) is medication to be sent to?[facility] [address] [place]  3. Do they need a 30 day or 90 day supply?[place]

## 2018-08-20 NOTE — Progress Notes (Signed)
90 sent per patient request.

## 2018-08-20 NOTE — Telephone Encounter (Signed)
90 days sent per patient request.

## 2018-08-20 NOTE — Patient Instructions (Addendum)
Continue oxygen therapy is close to 24 hours/day as possible  Continue Advair inhaler twice a day.  Rinse mouth after use  Change Spiriva HandiHaler to Spiriva Respimat inhaler.  2 actuations daily  New prescription: Azithromycin 250 mg daily  Continue albuterol inhaler as needed for increased shortness of breath, chest tightness, wheezing, cough  Follow-up in 2 months with chest x-ray prior to that visit.  Call sooner as needed

## 2018-08-20 NOTE — Telephone Encounter (Signed)
Please call pt would like to review her medication list.

## 2018-08-21 NOTE — Telephone Encounter (Signed)
Spoke with pt and she states she doesn't take all the meds on her med list. Informed pt when she comes back in for her follow up that we can update her meds and for her to bring them in with her to the visit. Nothing further needed.

## 2018-08-22 ENCOUNTER — Encounter: Payer: Self-pay | Admitting: Pulmonary Disease

## 2018-08-22 NOTE — Progress Notes (Signed)
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  Mild obesity  DATA: Spirometry 2014: FEV1 0.78 liters (38% pred)  CT chest 07/06/13: mod - severe emphysema. Chronic RLL scarring CXR 05/12/15: Advanced chronic obstructive pulmonary disease with similar right basilar scarring. No acute findings CXR 04/04/17: No acute findings   INTERVAL HISTORY: Received Zpak and course of prednisone called in by this office in July  SUBJ: This is a scheduled return office visit.  Other than outpatient treatment in July for an acute exacerbation of COPD, there have been no major events.  Overall, there is been little change in her respiratory status.  She continues to believe that she is gradually worsening over time with increasing exercise limitation.  She reports chronic cough with nonpurulent mucus which is thick and difficult to expectorate.  She remains compliant with previously prescribed inhaler regimen - Advair, Spiriva, albuterol as needed.  She is compliant with supplemental oxygen wearing it at 3 LPM by nasal cannula post to 24 hours/day.  She denies CP, fever, purulent sputum, hemoptysis, LE edema and calf tenderness.  OBJ: Vitals:   08/20/18 1102 08/20/18 1106  BP:  126/76  Pulse:  87  SpO2:  92%  Weight: 184 lb (83.5 kg)   Height: 5\' 3"  (1.6 m)   3 LPM [place] (pulse)  Gen: NAD, occasional rattling cough HEENT: NCAT, sclera white Neck: No JVD noted Lungs: Breath sounds moderately diminished throughout without wheezes.  Few scattered rhonchi Cardiovascular: RRR, no murmurs Abdomen: Soft, nontender, normal BS Ext: No edema noted Neuro: No focal deficits noted Skin: Limited exam, no lesions noted   DATA: BMP Latest Ref Rng & Units 10/19/2017 07/06/2013 07/05/2013  Glucose 65 - 99 mg/dL 129(H) 180(H) 180(H)  BUN 6 - 20 mg/dL 23(H) 17 15  Creatinine 0.44 - 1.00 mg/dL 1.46(H) 0.96 0.97  Sodium 135 - 145 mmol/L 135 143 142  Potassium 3.5 - 5.1 mmol/L 4.1 4.2 4.2  Chloride 101 - 111 mmol/L  97(L) 107 106  CO2 22 - 32 mmol/L 29 33(H) 30  Calcium 8.9 - 10.3 mg/dL 10.6(H) 9.8 9.7    CBC Latest Ref Rng & Units 10/19/2017 10/13/2014 07/06/2013  WBC 3.6 - 11.0 K/uL 11.2(H) 7.8 18.7(H)  Hemoglobin 12.0 - 16.0 g/dL 12.0 11.3(L) 15.1  Hematocrit 35.0 - 47.0 % 36.3 34.8(L) 45.1  Platelets 150 - 440 K/uL 156 174.0 166    CXR: No new film  IMPRESSION: 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  Rinse mouth after use Change Spiriva HandiHaler to Spiriva Respimat inhaler.  2 actuations daily. Continue albuterol inhaler as needed for chest tightness, wheezing, shortness of breath, cough Begin azithromycin 250 mg daily as maintenance therapy for gold D COPD Follow-up in 2 months with chest x-ray prior to that visit. If she continues to struggle, we can consider changing Advair to nebulized budesonide plus formoterol  Merton Border, MD P[facility] service Mobile [phone] Pager [phone]  08/22/2018 2:36 PM

## 2018-09-03 ENCOUNTER — Other Ambulatory Visit: Payer: Self-pay | Admitting: Pulmonary Disease

## 2018-09-11 DIAGNOSIS — J449 Chronic obstructive pulmonary disease, unspecified: Secondary | ICD-10-CM | POA: Diagnosis not present

## 2018-10-11 DIAGNOSIS — J449 Chronic obstructive pulmonary disease, unspecified: Secondary | ICD-10-CM | POA: Diagnosis not present

## 2018-10-17 ENCOUNTER — Ambulatory Visit
Admission: RE | Admit: 2018-10-17 | Discharge: 2018-10-17 | Disposition: A | Discharge status: Home / Self Care | Payer: PPO | Source: Ambulatory Visit | Attending: Pulmonary Disease | Admitting: Pulmonary Disease

## 2018-10-17 DIAGNOSIS — R918 Other nonspecific abnormal finding of lung field: Secondary | ICD-10-CM | POA: Insufficient documentation

## 2018-10-17 DIAGNOSIS — J449 Chronic obstructive pulmonary disease, unspecified: Secondary | ICD-10-CM | POA: Insufficient documentation

## 2018-10-17 DIAGNOSIS — R0602 Shortness of breath: Secondary | ICD-10-CM | POA: Diagnosis not present

## 2018-10-20 ENCOUNTER — Ambulatory Visit: Payer: PPO | Admitting: Pulmonary Disease

## 2018-10-20 ENCOUNTER — Encounter: Payer: Self-pay | Admitting: Pulmonary Disease

## 2018-10-20 VITALS — BP 130/72 | HR 65 | Resp 16 | Ht 63.0 in | Wt 184.0 lb

## 2018-10-20 DIAGNOSIS — H1851 Endothelial corneal dystrophy: Secondary | ICD-10-CM | POA: Diagnosis not present

## 2018-10-20 DIAGNOSIS — J9611 Chronic respiratory failure with hypoxia: Secondary | ICD-10-CM | POA: Diagnosis not present

## 2018-10-20 DIAGNOSIS — J449 Chronic obstructive pulmonary disease, unspecified: Secondary | ICD-10-CM | POA: Diagnosis not present

## 2018-10-20 DIAGNOSIS — Z23 Encounter for immunization: Secondary | ICD-10-CM | POA: Diagnosis not present

## 2018-10-20 NOTE — Progress Notes (Signed)
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  Mild obesity  DATA: Spirometry 2014: FEV1 0.78 liters (38% pred)  CT chest 07/06/13: mod - severe emphysema. Chronic RLL scarring CXR 05/12/15: Advanced chronic obstructive pulmonary disease with similar right basilar scarring. No acute findings CXR 04/04/17: No acute findings   INTERVAL HISTORY: Last visit 08/20/2018.  No major events since that time.  SUBJ: This is a scheduled return office visit.  She has no new complaints.  She remains severely limited by exertional dyspnea.  There is little day-to-day variation.  She remains on Advair, Spiriva Respimat, azithromycin.  She uses albuterol 0-3 times per day.  She denies CP, fever, purulent sputum, hemoptysis, LE edema and calf tenderness.  OBJ: Vitals:   10/20/18 1005 10/20/18 1020  BP:  130/72  Pulse:  65  Resp: 16   SpO2:  91%  Weight: 184 lb (83.5 kg)   Height: 5\' 3"  (1.6 m)   3 LPM [place] (pulse)  Gen: No distress HEENT: NCAT, sclerae white Neck: No JVD Lungs: BS markedly diminished, no wheezes or other adventitious sounds Cardiovascular: RRR, no M Abdomen: Soft, NT, NABS Ext: Trace symmetric bilateral ankle edema Neuro: No focal neurologic deficit Skin: Limited exam, no lesions noted   DATA: BMP Latest Ref Rng & Units 10/19/2017 07/06/2013 07/05/2013  Glucose 65 - 99 mg/dL 129(H) 180(H) 180(H)  BUN 6 - 20 mg/dL 23(H) 17 15  Creatinine 0.44 - 1.00 mg/dL 1.46(H) 0.96 0.97  Sodium 135 - 145 mmol/L 135 143 142  Potassium 3.5 - 5.1 mmol/L 4.1 4.2 4.2  Chloride 101 - 111 mmol/L 97(L) 107 106  CO2 22 - 32 mmol/L 29 33(H) 30  Calcium 8.9 - 10.3 mg/dL 10.6(H) 9.8 9.7    CBC Latest Ref Rng & Units 10/19/2017 10/13/2014 07/06/2013  WBC 3.6 - 11.0 K/uL 11.2(H) 7.8 18.7(H)  Hemoglobin 12.0 - 16.0 g/dL 12.0 11.3(L) 15.1  Hematocrit 35.0 - 47.0 % 36.3 34.8(L) 45.1  Platelets 150 - 440 K/uL 156 174.0 166    CXR 10/18: No acute findings.  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  Call sooner if needed.   Merton Border, MD PCCM service Mobile [phone] Pager [phone] 10/20/2018 1:35 PM

## 2018-10-20 NOTE — Patient Instructions (Signed)
Continue Advair, Spiriva, azithromycin as previously prescribed  Continue albuterol inhaler as needed for increased shortness of breath, wheezing, chest tightness, cough  I suggested trying albuterol inhaler 5 minutes prior to anticipated exertion to see if this is an effective strategy for you  Flu shot today  Follow-up in 3-4 months.  Call sooner if needed

## 2018-10-29 DIAGNOSIS — E1122 Type 2 diabetes mellitus with diabetic chronic kidney disease: Secondary | ICD-10-CM | POA: Diagnosis not present

## 2018-10-29 DIAGNOSIS — E669 Obesity, unspecified: Secondary | ICD-10-CM | POA: Diagnosis not present

## 2018-10-29 DIAGNOSIS — M25571 Pain in right ankle and joints of right foot: Secondary | ICD-10-CM | POA: Diagnosis not present

## 2018-10-29 DIAGNOSIS — S93421A Sprain of deltoid ligament of right ankle, initial encounter: Secondary | ICD-10-CM | POA: Diagnosis not present

## 2018-10-29 DIAGNOSIS — N183 Chronic kidney disease, stage 3 (moderate): Secondary | ICD-10-CM | POA: Diagnosis not present

## 2018-11-11 DIAGNOSIS — J449 Chronic obstructive pulmonary disease, unspecified: Secondary | ICD-10-CM | POA: Diagnosis not present

## 2018-12-11 DIAGNOSIS — J449 Chronic obstructive pulmonary disease, unspecified: Secondary | ICD-10-CM | POA: Diagnosis not present

## 2019-01-02 DIAGNOSIS — E785 Hyperlipidemia, unspecified: Secondary | ICD-10-CM | POA: Diagnosis not present

## 2019-01-02 DIAGNOSIS — E1169 Type 2 diabetes mellitus with other specified complication: Secondary | ICD-10-CM | POA: Diagnosis not present

## 2019-01-02 DIAGNOSIS — E1122 Type 2 diabetes mellitus with diabetic chronic kidney disease: Secondary | ICD-10-CM | POA: Diagnosis not present

## 2019-01-02 DIAGNOSIS — N183 Chronic kidney disease, stage 3 (moderate): Secondary | ICD-10-CM | POA: Diagnosis not present

## 2019-01-02 DIAGNOSIS — I10 Essential (primary) hypertension: Secondary | ICD-10-CM | POA: Diagnosis not present

## 2019-01-09 DIAGNOSIS — J9610 Chronic respiratory failure, unspecified whether with hypoxia or hypercapnia: Secondary | ICD-10-CM | POA: Diagnosis not present

## 2019-01-09 DIAGNOSIS — N183 Chronic kidney disease, stage 3 (moderate): Secondary | ICD-10-CM | POA: Diagnosis not present

## 2019-01-09 DIAGNOSIS — E038 Other specified hypothyroidism: Secondary | ICD-10-CM | POA: Insufficient documentation

## 2019-01-09 DIAGNOSIS — E1122 Type 2 diabetes mellitus with diabetic chronic kidney disease: Secondary | ICD-10-CM | POA: Diagnosis not present

## 2019-01-09 DIAGNOSIS — E1169 Type 2 diabetes mellitus with other specified complication: Secondary | ICD-10-CM | POA: Diagnosis not present

## 2019-01-09 DIAGNOSIS — I10 Essential (primary) hypertension: Secondary | ICD-10-CM | POA: Diagnosis not present

## 2019-01-09 DIAGNOSIS — J42 Unspecified chronic bronchitis: Secondary | ICD-10-CM | POA: Diagnosis not present

## 2019-01-09 DIAGNOSIS — E785 Hyperlipidemia, unspecified: Secondary | ICD-10-CM | POA: Diagnosis not present

## 2019-01-09 DIAGNOSIS — E039 Hypothyroidism, unspecified: Secondary | ICD-10-CM | POA: Diagnosis not present

## 2019-01-09 DIAGNOSIS — F325 Major depressive disorder, single episode, in full remission: Secondary | ICD-10-CM | POA: Diagnosis not present

## 2019-01-11 DIAGNOSIS — J449 Chronic obstructive pulmonary disease, unspecified: Secondary | ICD-10-CM | POA: Diagnosis not present

## 2019-01-13 ENCOUNTER — Telehealth: Payer: Self-pay | Admitting: Pulmonary Disease

## 2019-01-13 MED ORDER — AZITHROMYCIN 250 MG PO TABS: 250.0000 mg | ORAL_TABLET | Freq: Every day | ORAL | 0 refills | Status: DC

## 2019-01-13 NOTE — Telephone Encounter (Signed)
Called and spoke to pt, who is requesting 90 day supply of azithromycin 250mg . Pt last seen 10/20/18 and instructed to continue this medication daily. Pt has pending appt for 02/26/19. Rx for azithromycin 250 has been sent to preferred pharmacy. Nothing further is needed.

## 2019-01-19 ENCOUNTER — Telehealth: Payer: Self-pay

## 2019-01-19 NOTE — Telephone Encounter (Signed)
Spoke to [employer] at [facility] in regards to denial for spiriva and advair received via fax. After talking to [facility], the denial was for dropping these medications to Tier 2 from Tier 3 with insurance. This has been denied and both will stay at $45 copay. They have contacted patient already. Nothing further needed at this time.

## 2019-02-06 ENCOUNTER — Other Ambulatory Visit: Payer: Self-pay | Admitting: Pulmonary Disease

## 2019-02-06 NOTE — Telephone Encounter (Signed)
She is requesting 90 day

## 2019-02-06 NOTE — Telephone Encounter (Signed)
Attempted to contact patient, no answer. Wanted to clarify, we sent a 90 day azithromycin on 01/13/19 [facility]. Unsure why she would be needing another 90 day refill at this time. Will attempt to call back.

## 2019-02-06 NOTE — Telephone Encounter (Signed)
Spoke to patient, she is going to call her pharmacy and see why they have not sent her rx to her yet. She will call us if needed.

## 2019-02-11 DIAGNOSIS — J449 Chronic obstructive pulmonary disease, unspecified: Secondary | ICD-10-CM | POA: Diagnosis not present

## 2019-02-14 ENCOUNTER — Other Ambulatory Visit: Payer: Self-pay | Admitting: Pulmonary Disease

## 2019-02-26 ENCOUNTER — Other Ambulatory Visit: Payer: Self-pay

## 2019-02-26 ENCOUNTER — Encounter: Payer: Self-pay | Admitting: Pulmonary Disease

## 2019-02-26 ENCOUNTER — Ambulatory Visit: Payer: PPO | Admitting: Pulmonary Disease

## 2019-02-26 ENCOUNTER — Telehealth: Payer: Self-pay | Admitting: Pulmonary Disease

## 2019-02-26 VITALS — BP 126/72 | HR 70 | Ht 63.0 in | Wt 183.0 lb

## 2019-02-26 DIAGNOSIS — J449 Chronic obstructive pulmonary disease, unspecified: Secondary | ICD-10-CM

## 2019-02-26 DIAGNOSIS — J42 Unspecified chronic bronchitis: Secondary | ICD-10-CM

## 2019-02-26 DIAGNOSIS — J9611 Chronic respiratory failure with hypoxia: Secondary | ICD-10-CM

## 2019-02-26 MED ORDER — AZITHROMYCIN 250 MG PO TABS: ORAL_TABLET | ORAL | 5 refills | Status: DC

## 2019-02-26 MED ORDER — DOXYCYCLINE HYCLATE 100 MG PO TABS: 100.0000 mg | ORAL_TABLET | Freq: Two times a day (BID) | ORAL | 0 refills | Status: DC

## 2019-02-26 NOTE — Telephone Encounter (Signed)
Spoke to patient and explained to her what mobic is and who prescribed it. The patient and I went medication for medication to make sure our lists matched. I have removed Mobic and albuterol nebs from patient's med list.

## 2019-02-26 NOTE — Patient Instructions (Signed)
Continue Advair, Spiriva as previously prescribed Continue oxygen therapy is close to 24 hours/day as possible Continue albuterol inhaler as needed New prescription: Doxycycline 100 mg twice a day for 5 days Hold azithromycin while taking doxycycline When you resume azithromycin, change to every Monday, Wednesday, Friday Follow-up in 4 months.  Call sooner if needed

## 2019-03-01 NOTE — Progress Notes (Signed)
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  Mild obesity  DATA: Spirometry 2014: FEV1 0.78 liters (38% pred)  CT chest 07/06/13: mod - severe emphysema. Chronic RLL scarring CXR 05/12/15: Advanced chronic obstructive pulmonary disease with similar right basilar scarring. No acute findings CXR 04/04/17: No acute findings   INTERVAL HISTORY: Last visit 10/20/2018.  No major events since that time.  SUBJ: This is a scheduled follow-up.  Overall, she reports no significant change in symptoms.  She continues to have severe exertional limitation due to dyspnea.  She has little day-to-day variation.  She remains on Advair, Spiriva, chronic oxygen therapy and daily azithromycin.  She uses albuterol up to 3 times per day.  On very good days she might go without albuterol rescue inhaler.  She does have chronic cough occasionally productive of yellow-green mucus.  She denies hemoptysis, fever, chest pain, lower extremity edema, calf tenderness.  OBJ: Vitals:   02/26/19 1058 02/26/19 1100  BP:  126/72  Pulse:  70  SpO2:  94%  Weight: 183 lb (83 kg)   Height: 5\' 3"  (1.6 m)   3 LPM [place] (pulse)  Gen: NAD, occasional rattling cough HEENT: NCAT, sclerae white Neck: Supple, no JVD Lungs: Breath sounds markedly diminished, no wheezes, few scattered rhonchi Cardiovascular: Regular, no M Abdomen: Soft, NT, NABS Ext: No C/C/E Neuro: No focal deficits Skin: Limited exam, no lesions noted   DATA: BMP Latest Ref Rng & Units 10/19/2017 07/06/2013 07/05/2013  Glucose 65 - 99 mg/dL 129(H) 180(H) 180(H)  BUN 6 - 20 mg/dL 23(H) 17 15  Creatinine 0.44 - 1.00 mg/dL 1.46(H) 0.96 0.97  Sodium 135 - 145 mmol/L 135 143 142  Potassium 3.5 - 5.1 mmol/L 4.1 4.2 4.2  Chloride 101 - 111 mmol/L 97(L) 107 106  CO2 22 - 32 mmol/L 29 33(H) 30  Calcium 8.9 - 10.3 mg/dL 10.6(H) 9.8 9.7    CBC Latest Ref Rng & Units 10/19/2017 10/13/2014 07/06/2013  WBC 3.6 - 11.0 K/uL 11.2(H) 7.8 18.7(H)  Hemoglobin 12.0 - 16.0  g/dL 12.0 11.3(L) 15.1  Hematocrit 35.0 - 47.0 % 36.3 34.8(L) 45.1  Platelets 150 - 440 K/uL 156 174.0 166    CXR: No new film  IMPRESSION:   ICD-10-CM   1. Chronic hypoxemic respiratory failure (HCC) J96.11   2. COPD, very severe ([place]) J44.9   3. Purulent exacerbation of chronic bronchitis J42     PLAN: Continue Advair, Spiriva as previously prescribed Continue oxygen therapy is close to 24 hours/day as possible Continue albuterol inhaler as needed New prescription: Doxycycline 100 mg twice a day for 5 days Hold azithromycin while taking doxycycline When you resume azithromycin, change to every Monday, Wednesday, Friday Follow-up in 4 months.  Call sooner if needed   Merton Border, MD PCCM service Mobile [phone] Pager [phone] 03/01/2019 3:59 PM

## 2019-03-02 ENCOUNTER — Other Ambulatory Visit: Payer: Self-pay

## 2019-03-02 MED ORDER — AZITHROMYCIN 250 MG PO TABS: ORAL_TABLET | ORAL | 1 refills | Status: DC

## 2019-03-12 DIAGNOSIS — J449 Chronic obstructive pulmonary disease, unspecified: Secondary | ICD-10-CM | POA: Diagnosis not present

## 2019-04-04 ENCOUNTER — Encounter: Payer: Self-pay | Admitting: Emergency Medicine

## 2019-04-04 ENCOUNTER — Emergency Department: Payer: PPO

## 2019-04-04 ENCOUNTER — Emergency Department
Admission: EM | Admit: 2019-04-04 | Discharge: 2019-04-04 | Disposition: A | Discharge status: Home / Self Care | Payer: PPO | Attending: Emergency Medicine | Admitting: Emergency Medicine

## 2019-04-04 ENCOUNTER — Other Ambulatory Visit: Payer: Self-pay

## 2019-04-04 DIAGNOSIS — N451 Epididymitis: Secondary | ICD-10-CM | POA: Diagnosis not present

## 2019-04-04 DIAGNOSIS — S0990XA Unspecified injury of head, initial encounter: Secondary | ICD-10-CM | POA: Insufficient documentation

## 2019-04-04 DIAGNOSIS — J449 Chronic obstructive pulmonary disease, unspecified: Secondary | ICD-10-CM | POA: Insufficient documentation

## 2019-04-04 DIAGNOSIS — N183 Chronic kidney disease, stage 3 (moderate): Secondary | ICD-10-CM | POA: Insufficient documentation

## 2019-04-04 DIAGNOSIS — R1013 Epigastric pain: Secondary | ICD-10-CM | POA: Diagnosis not present

## 2019-04-04 DIAGNOSIS — J4531 Mild persistent asthma with (acute) exacerbation: Secondary | ICD-10-CM | POA: Diagnosis not present

## 2019-04-04 DIAGNOSIS — Y9301 Activity, walking, marching and hiking: Secondary | ICD-10-CM | POA: Diagnosis not present

## 2019-04-04 DIAGNOSIS — S52502A Unspecified fracture of the lower end of left radius, initial encounter for closed fracture: Secondary | ICD-10-CM | POA: Insufficient documentation

## 2019-04-04 DIAGNOSIS — J9610 Chronic respiratory failure, unspecified whether with hypoxia or hypercapnia: Secondary | ICD-10-CM | POA: Diagnosis not present

## 2019-04-04 DIAGNOSIS — S52602A Unspecified fracture of lower end of left ulna, initial encounter for closed fracture: Secondary | ICD-10-CM | POA: Diagnosis not present

## 2019-04-04 DIAGNOSIS — Z79891 Long term (current) use of opiate analgesic: Secondary | ICD-10-CM | POA: Diagnosis not present

## 2019-04-04 DIAGNOSIS — S62109A Fracture of unspecified carpal bone, unspecified wrist, initial encounter for closed fracture: Secondary | ICD-10-CM

## 2019-04-04 DIAGNOSIS — I10 Essential (primary) hypertension: Secondary | ICD-10-CM | POA: Diagnosis not present

## 2019-04-04 DIAGNOSIS — W19XXXA Unspecified fall, initial encounter: Secondary | ICD-10-CM

## 2019-04-04 DIAGNOSIS — Z79899 Other long term (current) drug therapy: Secondary | ICD-10-CM | POA: Insufficient documentation

## 2019-04-04 DIAGNOSIS — H02402 Unspecified ptosis of left eyelid: Secondary | ICD-10-CM | POA: Diagnosis not present

## 2019-04-04 DIAGNOSIS — E1122 Type 2 diabetes mellitus with diabetic chronic kidney disease: Secondary | ICD-10-CM | POA: Diagnosis not present

## 2019-04-04 DIAGNOSIS — Z125 Encounter for screening for malignant neoplasm of prostate: Secondary | ICD-10-CM | POA: Diagnosis not present

## 2019-04-04 DIAGNOSIS — H3500 Unspecified background retinopathy: Secondary | ICD-10-CM | POA: Diagnosis not present

## 2019-04-04 DIAGNOSIS — R52 Pain, unspecified: Secondary | ICD-10-CM | POA: Diagnosis not present

## 2019-04-04 DIAGNOSIS — Z87891 Personal history of nicotine dependence: Secondary | ICD-10-CM | POA: Insufficient documentation

## 2019-04-04 DIAGNOSIS — Y998 Other external cause status: Secondary | ICD-10-CM | POA: Diagnosis not present

## 2019-04-04 DIAGNOSIS — S52352A Displaced comminuted fracture of shaft of radius, left arm, initial encounter for closed fracture: Secondary | ICD-10-CM | POA: Diagnosis not present

## 2019-04-04 DIAGNOSIS — Z7689 Persons encountering health services in other specified circumstances: Secondary | ICD-10-CM | POA: Diagnosis not present

## 2019-04-04 DIAGNOSIS — Z23 Encounter for immunization: Secondary | ICD-10-CM | POA: Diagnosis not present

## 2019-04-04 DIAGNOSIS — H3581 Retinal edema: Secondary | ICD-10-CM | POA: Diagnosis not present

## 2019-04-04 DIAGNOSIS — I129 Hypertensive chronic kidney disease with stage 1 through stage 4 chronic kidney disease, or unspecified chronic kidney disease: Secondary | ICD-10-CM | POA: Insufficient documentation

## 2019-04-04 DIAGNOSIS — S6992XA Unspecified injury of left wrist, hand and finger(s), initial encounter: Secondary | ICD-10-CM | POA: Diagnosis present

## 2019-04-04 DIAGNOSIS — H519 Unspecified disorder of binocular movement: Secondary | ICD-10-CM | POA: Diagnosis not present

## 2019-04-04 DIAGNOSIS — M25539 Pain in unspecified wrist: Secondary | ICD-10-CM | POA: Diagnosis not present

## 2019-04-04 DIAGNOSIS — W010XXA Fall on same level from slipping, tripping and stumbling without subsequent striking against object, initial encounter: Secondary | ICD-10-CM | POA: Insufficient documentation

## 2019-04-04 DIAGNOSIS — K219 Gastro-esophageal reflux disease without esophagitis: Secondary | ICD-10-CM | POA: Diagnosis not present

## 2019-04-04 DIAGNOSIS — D329 Benign neoplasm of meninges, unspecified: Secondary | ICD-10-CM | POA: Diagnosis not present

## 2019-04-04 DIAGNOSIS — R918 Other nonspecific abnormal finding of lung field: Secondary | ICD-10-CM | POA: Diagnosis not present

## 2019-04-04 DIAGNOSIS — R319 Hematuria, unspecified: Secondary | ICD-10-CM | POA: Diagnosis not present

## 2019-04-04 DIAGNOSIS — H35033 Hypertensive retinopathy, bilateral: Secondary | ICD-10-CM | POA: Diagnosis not present

## 2019-04-04 DIAGNOSIS — Z87898 Personal history of other specified conditions: Secondary | ICD-10-CM | POA: Diagnosis not present

## 2019-04-04 DIAGNOSIS — H25813 Combined forms of age-related cataract, bilateral: Secondary | ICD-10-CM | POA: Diagnosis not present

## 2019-04-04 DIAGNOSIS — S52252A Displaced comminuted fracture of shaft of ulna, left arm, initial encounter for closed fracture: Secondary | ICD-10-CM | POA: Diagnosis not present

## 2019-04-04 DIAGNOSIS — Y92018 Other place in single-family (private) house as the place of occurrence of the external cause: Secondary | ICD-10-CM | POA: Diagnosis not present

## 2019-04-04 DIAGNOSIS — E119 Type 2 diabetes mellitus without complications: Secondary | ICD-10-CM | POA: Diagnosis not present

## 2019-04-04 DIAGNOSIS — N529 Male erectile dysfunction, unspecified: Secondary | ICD-10-CM | POA: Diagnosis not present

## 2019-04-04 DIAGNOSIS — R55 Syncope and collapse: Secondary | ICD-10-CM | POA: Diagnosis not present

## 2019-04-04 DIAGNOSIS — K2970 Gastritis, unspecified, without bleeding: Secondary | ICD-10-CM | POA: Diagnosis not present

## 2019-04-04 DIAGNOSIS — S14109A Unspecified injury at unspecified level of cervical spinal cord, initial encounter: Secondary | ICD-10-CM | POA: Diagnosis not present

## 2019-04-04 DIAGNOSIS — G4733 Obstructive sleep apnea (adult) (pediatric): Secondary | ICD-10-CM | POA: Diagnosis not present

## 2019-04-04 MED ORDER — FENTANYL CITRATE (PF) 100 MCG/2ML IJ SOLN
50.0000 ug | Freq: Once | INTRAMUSCULAR | Status: AC
  Administered 2019-04-04: 11:00:00 50 ug via INTRAVENOUS
  Filled 2019-04-04: qty 2

## 2019-04-04 MED ORDER — OXYCODONE HCL 5 MG PO TABS: 5.0000 mg | ORAL_TABLET | Freq: Three times a day (TID) | ORAL | 0 refills | Status: AC | PRN

## 2019-04-04 MED ORDER — ONDANSETRON 4 MG PO TBDP: 4.0000 mg | ORAL_TABLET | Freq: Three times a day (TID) | ORAL | 0 refills | Status: DC | PRN

## 2019-04-04 MED ORDER — FENTANYL CITRATE (PF) 100 MCG/2ML IJ SOLN
50.0000 ug | Freq: Once | INTRAMUSCULAR | Status: AC
  Administered 2019-04-04: 12:00:00 50 ug via INTRAVENOUS
  Filled 2019-04-04: qty 2

## 2019-04-04 MED ORDER — ONDANSETRON HCL 4 MG/2ML IJ SOLN
4.0000 mg | Freq: Once | INTRAMUSCULAR | Status: AC
  Administered 2019-04-04: 4 mg via INTRAVENOUS
  Filled 2019-04-04: qty 2

## 2019-04-04 MED ORDER — ONDANSETRON 4 MG PO TBDP
4.0000 mg | ORAL_TABLET | Freq: Once | ORAL | Status: AC
  Administered 2019-04-04: 14:00:00 4 mg via ORAL
  Filled 2019-04-04: qty 1

## 2019-04-04 NOTE — ED Notes (Signed)
Remains in Xray

## 2019-04-04 NOTE — ED Notes (Signed)
Patient given something to drink. Ok by MD

## 2019-04-04 NOTE — Discharge Instructions (Signed)
Pain control: Take tylenol 1000mg every 8 hours. Take 5mg of oxycodone every 6 hours for breakthrough pain. If you need the oxycodone make sure to take one senokot as well to prevent constipation.  Do not drink alcohol, drive or participate in any other potentially dangerous activities while taking this medication as it may make you sleepy. Do not take this medication with any other sedating medications, either prescription or over-the-counter.  

## 2019-04-04 NOTE — ED Notes (Signed)
Patient AAox4. Vitals stable. NAD.

## 2019-04-04 NOTE — ED Triage Notes (Addendum)
Arrives via [facility] s/p fall.  EMS reports that patient fell this morning at around 0530, tripped over door threshold.  Patient fell onto left arm and EMS reports obvious left wrist deformity.  Patient was found laying on floor this morning.  Patient states she hit right side of head, but denies LOC.  # 22 g right hand and was given 50 mcg fentanyl by EMS.  Patient is AAOx3.  Skin warm and dry.  C/O nausea.

## 2019-04-04 NOTE — Consult Note (Signed)
Called by ER physician, Dr. Rudene Re.  Patient is an 78 year old female with COPD and home O2 dependent who is status post a mechanical fall onto the left upper extremity.  X-rays demonstrate a distal both bone forearm fracture.  Close reduction was attempted by ER physician.  Post reduction was in the splint demonstrate acceptable alignment on the lateral image.  The AP image shows slight varus angulation with a comminuted distal radius fracture.  Patient is reported to be neurovascular intact.  I do not believe that surgical intervention is needed emergently.  I have recommended the patient be discharged home in her splint and will follow-up in our office in the next week for further evaluation, repeat x-ray to determine if the patient will require surgical intervention once swelling has diminished.

## 2019-04-04 NOTE — ED Provider Notes (Addendum)
[facility] Emergency Department Provider Note  ____________________________________________  Time seen: Approximately 9:56 AM  I have reviewed the triage vital signs and the nursing notes.   HISTORY  Chief Complaint Fall   HPI [name] is a 78 y.o. female with history as listed below who presents for evaluation of mechanical fall.  Patient reports that she was walking in the den when she tripped over the door threshold and fell onto her left side.  She is right-handed.  She reports pain and deformity to the left wrist.  She also reports hitting her head on the floor.  She is not on blood thinners.  No LOC.  Patient was unable to stand up due to severe pain and inability to use her left arm to help her get up.  She sat on the floor for 3 hours until she was found by her family members.  She reports that her husband and daughter were sleeping and did not hear her call.  She reports that the fall was mechanical in nature.  She is complaining of moderate constant pain located in the left wrist area since the fall.  The pain slightly improved after receiving 50 mcg of fentanyl by EMS.  Past Medical History:  Diagnosis Date   COPD (chronic obstructive pulmonary disease) ([place])    Environmental allergies    High blood pressure    High cholesterol     Patient Active Problem List   Diagnosis Date Noted   Onychomycosis 09/20/2016   Health care maintenance 06/19/2016   Back pain 05/12/2015   Fatigue 10/13/2014   Major depression in remission ([place]) 10/10/2014   Type 2 diabetes mellitus with stage 3 chronic kidney disease ([place]) 06/08/2014   Edema 05/29/2014   HTN (hypertension), benign 05/29/2014   Hyperlipidemia associated with type 2 diabetes mellitus ([place]) 05/29/2014   UI (urinary incontinence) 05/29/2014   Dyspnea 04/26/2014   COPD (chronic obstructive pulmonary disease) ([place]) 03/25/2014   Chronic respiratory failure ([place]) 03/25/2014      Past Surgical History:  Procedure Laterality Date   BREAST LUMPECTOMY Bilateral 1962 (L) 2002(R)   VESICOVAGINAL FISTULA CLOSURE W/ TAH  1975    Prior to Admission medications   Medication Sig Start Date End Date Taking? Authorizing Provider  ADVAIR DISKUS 250-50 MCG/DOSE AEPB Inhale 1 puff by mouth into the lungs twice a day 09/03/18   Wilhelmina Mcardle, MD  albuterol [facility] HFA) 108 (90 Base) MCG/ACT inhaler Inhale 2 puffs into the lungs every 6 (six) hours as needed for wheezing or shortness of breath. 03/18/18   Wilhelmina Mcardle, MD  atorvastatin (LIPITOR) 20 MG tablet Take 20 mg by mouth daily.    [provider]  azithromycin (ZITHROMAX) 250 MG tablet Take one pill every Monday, Wednesday and Friday. 03/02/19   Wilhelmina Mcardle, MD  carvedilol (COREG) 6.25 MG tablet Take 6.25 mg by mouth 2 (two) times daily with a meal.    [provider]  doxycycline (VIBRA-TABS) 100 MG tablet Take 1 tablet (100 mg total) by mouth 2 (two) times daily. 02/26/19   Wilhelmina Mcardle, MD  oxyCODONE (ROXICODONE) 5 MG immediate release tablet Take 1 tablet (5 mg total) by mouth every 8 (eight) hours as needed. 04/04/19 04/03/20  Rudene Re, MD  potassium chloride (MICRO-K) 10 MEQ CR capsule Take 10 mEq by mouth daily.    [provider]  Tiotropium Bromide Monohydrate (SPIRIVA RESPIMAT) 2.5 MCG/ACT AERS Inhale 2 puffs into the lungs  daily. 08/20/18   Wilhelmina Mcardle, MD  torsemide (DEMADEX) 20 MG tablet Take 20 mg by mouth daily.    [provider]    Allergies Mucinex d [pseudoephedrine-guaifenesin er]  Family History  Problem Relation Age of Onset   Heart disease Father    Rheum arthritis Mother    Cancer Mother        breast   Heart disease Brother        2 brothers   Cancer Maternal Aunt        cervical   Cancer Maternal Aunt        stomach    Social History Social History   Tobacco Use   Smoking status: Former Smoker    Packs/day: 1.00     Years: 45.00    Pack years: 45.00    Types: Cigarettes    Last attempt to quit: 07/03/2013    Years since quitting: 5.7   Smokeless tobacco: Never Used  Substance Use Topics   Alcohol use: Yes    Alcohol/week: 0.0 standard drinks    Comment: occasional glass of wine   Drug use: No    Review of Systems Constitutional: Negative for fever. Eyes: Negative for visual changes. ENT: Negative for facial injury or neck injury Cardiovascular: Negative for chest injury. Respiratory: Negative for shortness of breath. Negative for chest wall injury. Gastrointestinal: Negative for abdominal pain or injury. Genitourinary: Negative for dysuria. Musculoskeletal: Negative for back injury, + L wrist injury. Skin: Negative for laceration/abrasions. Neurological: Negative for head injury.  ____________________________________________   PHYSICAL EXAM:  VITAL SIGNS: ED Triage Vitals  Enc Vitals Group     BP 04/04/19 0929 (!) 160/81     Pulse Rate 04/04/19 0929 71     Resp 04/04/19 0929 16     Temp 04/04/19 0929 (!) 97.4 F (36.3 C)     Temp Source 04/04/19 0929 Oral     SpO2 04/04/19 0929 96 %     Weight 04/04/19 0931 180 lb (81.6 kg)     Height 04/04/19 0931 5\' 4"  (1.626 m)     Head Circumference --      Peak Flow --      Pain Score 04/04/19 0930 6     Pain Loc --      Pain Edu? --      Excl. in [place]? --    Constitutional: Alert and oriented. No acute distress. Does not appear intoxicated. HEENT Head: Normocephalic and atraumatic. Face: No facial bony tenderness. Stable midface Ears: No hemotympanum bilaterally. No Battle sign Eyes: No eye injury. PERRL. No raccoon eyes Nose: Nontender. No epistaxis. No rhinorrhea Mouth/Throat: Mucous membranes are moist. No oropharyngeal blood. No dental injury. Airway patent without stridor. Normal voice. Neck: no C-collar in place. No midline c-spine tenderness.  Cardiovascular: Normal rate, regular rhythm. Normal and symmetric distal pulses  are present in all extremities. Pulmonary/Chest: Chest wall is stable and nontender to palpation/compression. Normal respiratory effort. Breath sounds are normal. No crepitus.  Abdominal: Soft, nontender, non distended. Musculoskeletal: Deformity and ttp over the L distal forearm and wrist area with intact pulse and intact neuro exam, no tenderness to palpation and ROM of the elbow or shoulder. Nontender with normal full range of motion in all other extremities. No deformities. No thoracic or lumbar midline spinal tenderness. Pelvis is stable. Skin: Skin is warm, dry and intact. No abrasions or contutions. Psychiatric: Speech and behavior are appropriate. Neurological: Normal speech and language. Moves all extremities to  command. No gross focal neurologic deficits are appreciated.  Glascow Coma Score: 4 - Opens eyes on own 6 - Follows simple motor commands 5 - Alert and oriented GCS: 15   ____________________________________________   LABS (all labs ordered are listed, but only abnormal results are displayed)  Labs Reviewed - No data to display ____________________________________________  EKG  none  ____________________________________________  RADIOLOGY  I have personally reviewed the images performed during this visit and I agree with the Radiologist's read.   Interpretation by Radiologist:  Dg Wrist 2 Views Left  Result Date: 04/04/2019 CLINICAL DATA:  Post reduction left wrist fracture EXAM: LEFT WRIST - 2 VIEW COMPARISON:  Left wrist radiographs-earlier same day FINDINGS: Evaluation for fine bony detail is degraded secondary overlying splint/casting material. Images demonstrate improved alignment of persistently displaced markedly comminuted fractures involving the distal metaphyses of the radius and ulna. Again, there is expected extension of the distal radial fracture to the radiocarpal joint. Minimally displaced fracture of the ulnar styloid process. No additional  fractures identified. Expected adjacent soft tissue swelling. No radiopaque foreign body. Degenerative change involving the STT joints of the base of thumb with joint space loss, subchondral sclerosis and osteophytosis. Remaining joint spaces appear preserved. IMPRESSION: Improved alignment of persistently displaced markedly comminuted distal radial and ulnar fractures as detailed above. Electronically Signed   By: Sandi Mariscal M.D.   On: 04/04/2019 13:30   Dg Wrist Complete Left  Result Date: 04/04/2019 CLINICAL DATA:  EMS reports that patient fell this morning at around 0530, tripped over door threshold. Patient fell onto left arm and EMS reports obvious left wrist deformity. Patient was found laying on floor this morning. Patient states she hit right side of head, but denies LOC. EXAM: LEFT WRIST - COMPLETE 3+ VIEW COMPARISON:  None. FINDINGS: There are fractures of the distal radius and ulna. Distal radial fracture is comminuted displaced and impacted. Primary fracture is transverse across the metaphysis with multiple comminuted fracture components. Fracture is impacted with mild radial displacement of approximately 5-6 mm, and mild dorsal angulation of the distal radial articular surface of 8-10 degrees. Distal ulnar fracture is mildly comminuted, angulated radially, but nondisplaced. Wrist joints are normally aligned. There is surrounding soft tissue swelling. IMPRESSION: 1. Comminuted, impacted and mildly displaced fracture of the distal radius. Comminuted, mildly angulated nondisplaced fracture of the distal ulna. No dislocation. Electronically Signed   By: Lajean Manes M.D.   On: 04/04/2019 10:34   Ct Head Wo Contrast  Result Date: 04/04/2019 CLINICAL DATA:  Fall with pain EXAM: CT HEAD WITHOUT CONTRAST CT CERVICAL SPINE WITHOUT CONTRAST TECHNIQUE: Multidetector CT imaging of the head and cervical spine was performed following the standard protocol without intravenous contrast. Multiplanar CT image  reconstructions of the cervical spine were also generated. COMPARISON:  None. FINDINGS: CT HEAD FINDINGS Brain: No evidence of acute infarction, hemorrhage, hydrocephalus, extra-axial collection or mass lesion/mass effect. Vascular: No hyperdense vessel or unexpected calcification. Skull: Negative for fracture Sinuses/Orbits: No evidence of injury CT CERVICAL SPINE FINDINGS Alignment: Reversal of cervical lordosis. Skull base and vertebrae: Negative for fracture or bone lesion Soft tissues and spinal canal: No prevertebral fluid or swelling. No visible canal hematoma. Disc levels: Generalized disc narrowing and ridging with facet spurring. Upper chest: Emphysema IMPRESSION: No evidence of acute intracranial or cervical spine injury. Electronically Signed   By: Monte Fantasia M.D.   On: 04/04/2019 10:19   Ct Cervical Spine Wo Contrast  Result Date: 04/04/2019 CLINICAL DATA:  Fall with  pain EXAM: CT HEAD WITHOUT CONTRAST CT CERVICAL SPINE WITHOUT CONTRAST TECHNIQUE: Multidetector CT imaging of the head and cervical spine was performed following the standard protocol without intravenous contrast. Multiplanar CT image reconstructions of the cervical spine were also generated. COMPARISON:  None. FINDINGS: CT HEAD FINDINGS Brain: No evidence of acute infarction, hemorrhage, hydrocephalus, extra-axial collection or mass lesion/mass effect. Vascular: No hyperdense vessel or unexpected calcification. Skull: Negative for fracture Sinuses/Orbits: No evidence of injury CT CERVICAL SPINE FINDINGS Alignment: Reversal of cervical lordosis. Skull base and vertebrae: Negative for fracture or bone lesion Soft tissues and spinal canal: No prevertebral fluid or swelling. No visible canal hematoma. Disc levels: Generalized disc narrowing and ridging with facet spurring. Upper chest: Emphysema IMPRESSION: No evidence of acute intracranial or cervical spine injury. Electronically Signed   By: Monte Fantasia M.D.   On: 04/04/2019  10:19      ____________________________________________   PROCEDURES  Procedure(s) performed: yes Reduction of fracture Date/Time: 04/04/2019 2:03 PM Performed by: Rudene Re, MD Authorized by: Rudene Re, MD  Consent: Verbal consent obtained. Risks and benefits: risks, benefits and alternatives were discussed Consent given by: patient Local anesthesia used: yes Anesthesia: hematoma block  Anesthesia: Local anesthesia used: yes Local Anesthetic: lidocaine 1% without epinephrine Anesthetic total: 8 mL  Sedation: Patient sedated: no  Patient tolerance: Patient tolerated the procedure well with no immediate complications Comments: Finger traps with weight with manual reduction    Critical Care performed:  None ____________________________________________   INITIAL IMPRESSION / ASSESSMENT AND PLAN / ED COURSE  78 y.o. female with history as listed below who presents for evaluation of mechanical fall with L wrist deformity and pain. Neurovascular exam is intact. XR showed comminuted distal radius and ulna fracture with displacement. CT head and cspine showed no intracranial abnormalities. Finger traps and weight were used for 40 min to help re-align the fracture. Patient was splinted, repeat x-rays show slight improvement alignment.  Discussed with Dr. Mack Guise who evaluated the x-rays and was pleased with the position of the bones and recommended outpatient follow-up in 2 days in the office.  Discussed splint care with patient.  She was given a sling.  Her limb remains neurovascularly intact before and after splinting.  Discussed signs and symptoms of compartment syndrome and recommended return to the emergency room if these develop.      As part of my medical decision making, I reviewed the following data within the [place] notes reviewed and incorporated, Old chart reviewed, Radiograph reviewed , A consult was requested and obtained  from this/these consultant(s) Orthopedics, Notes from prior ED visits and [place] Controlled Substance Database    Pertinent labs & imaging results that were available during my care of the patient were reviewed by me and considered in my medical decision making (see chart for details).    ____________________________________________   FINAL CLINICAL IMPRESSION(S) / ED DIAGNOSES  Final diagnoses:  Fall, initial encounter  Closed fracture of distal end of left radius, unspecified fracture morphology, initial encounter  Closed fracture of distal end of left ulna, unspecified fracture morphology, initial encounter      NEW MEDICATIONS STARTED DURING THIS VISIT:  ED Discharge Orders         Ordered    oxyCODONE (ROXICODONE) 5 MG immediate release tablet  Every 8 hours PRN     04/04/19 1350           Note:  This document was prepared using Dragon voice recognition software  and may include unintentional dictation errors.    Alfred Levins, [place], MD 04/04/19 [place], [place], MD 04/04/19 [phone]

## 2019-04-06 DIAGNOSIS — S52522A Torus fracture of lower end of left radius, initial encounter for closed fracture: Secondary | ICD-10-CM | POA: Diagnosis not present

## 2019-04-06 DIAGNOSIS — S60022A Contusion of left index finger without damage to nail, initial encounter: Secondary | ICD-10-CM | POA: Diagnosis not present

## 2019-04-06 DIAGNOSIS — Z1231 Encounter for screening mammogram for malignant neoplasm of breast: Secondary | ICD-10-CM | POA: Diagnosis not present

## 2019-04-06 DIAGNOSIS — S60021A Contusion of right index finger without damage to nail, initial encounter: Secondary | ICD-10-CM | POA: Diagnosis not present

## 2019-04-13 DIAGNOSIS — S52502D Unspecified fracture of the lower end of left radius, subsequent encounter for closed fracture with routine healing: Secondary | ICD-10-CM | POA: Diagnosis not present

## 2019-04-13 DIAGNOSIS — S52202D Unspecified fracture of shaft of left ulna, subsequent encounter for closed fracture with routine healing: Secondary | ICD-10-CM | POA: Diagnosis not present

## 2019-04-13 DIAGNOSIS — M0609 Rheumatoid arthritis without rheumatoid factor, multiple sites: Secondary | ICD-10-CM | POA: Diagnosis not present

## 2019-04-15 ENCOUNTER — Telehealth: Payer: Self-pay | Admitting: Pulmonary Disease

## 2019-04-15 DIAGNOSIS — S52502D Unspecified fracture of the lower end of left radius, subsequent encounter for closed fracture with routine healing: Secondary | ICD-10-CM | POA: Diagnosis not present

## 2019-04-15 DIAGNOSIS — S52202D Unspecified fracture of shaft of left ulna, subsequent encounter for closed fracture with routine healing: Secondary | ICD-10-CM | POA: Diagnosis not present

## 2019-04-15 NOTE — Telephone Encounter (Signed)
Spoke to patient, let her know she needs to contact her primary care doctor for any anxiety medications. Patient understands. Nothing further needed at this time.

## 2019-04-16 DIAGNOSIS — E785 Hyperlipidemia, unspecified: Secondary | ICD-10-CM | POA: Diagnosis not present

## 2019-04-16 DIAGNOSIS — N183 Chronic kidney disease, stage 3 (moderate): Secondary | ICD-10-CM | POA: Diagnosis not present

## 2019-04-16 DIAGNOSIS — E1122 Type 2 diabetes mellitus with diabetic chronic kidney disease: Secondary | ICD-10-CM | POA: Diagnosis not present

## 2019-04-16 DIAGNOSIS — E1169 Type 2 diabetes mellitus with other specified complication: Secondary | ICD-10-CM | POA: Diagnosis not present

## 2019-04-16 DIAGNOSIS — J9610 Chronic respiratory failure, unspecified whether with hypoxia or hypercapnia: Secondary | ICD-10-CM | POA: Diagnosis not present

## 2019-04-20 DIAGNOSIS — S52502D Unspecified fracture of the lower end of left radius, subsequent encounter for closed fracture with routine healing: Secondary | ICD-10-CM | POA: Diagnosis not present

## 2019-04-20 DIAGNOSIS — S5292XA Unspecified fracture of left forearm, initial encounter for closed fracture: Secondary | ICD-10-CM | POA: Diagnosis not present

## 2019-04-20 DIAGNOSIS — S52202D Unspecified fracture of shaft of left ulna, subsequent encounter for closed fracture with routine healing: Secondary | ICD-10-CM | POA: Diagnosis not present

## 2019-04-24 ENCOUNTER — Other Ambulatory Visit: Payer: Self-pay | Admitting: *Deleted

## 2019-04-24 NOTE — Patient Outreach (Signed)
[place] [facility]) Care Management  04/24/2019  [name] [birth year] [id number]   Subjective: Telephone call to patient's home / mobile number, spoke with patient, and HIPAA verified.  Discussed [facility] Care Management [employer] Nurse Call Line follow up, patient voiced understanding, and is in agreement to follow up.   Patient states she is doing alright, still having same issues as discussed with nurse line this am, is planning to try all recommendations given by nurse line, and will also follow up with primary MD as needed.  States someone has gone to the store to pick up supplies that was recommended by nurse advice line ( fluids, fibers, fruits ) and she is continuing to take Extra Strength Tylenol for pain.   States the oxycodone upsets her stomach.  Patient states she does not have any education material, nurse call line, care coordination, disease management, disease monitoring, transportation, community resource, or pharmacy needs at this time.  States she is very appreciative of the follow up.    Objective: Per KPN (Knowledge Performance Now, point of care tool) and chart review, no recent hospitalization.   Patient had ED visit on 04/04/2019 for mechanical fall with Closed fracture of distal end of left radius and Closed fracture of distal end of left ulna.     Patient also has a history of COPD, diabetes, hypertension, and hyperlipidemia.         Assessment: Received [employer] Nurse Call Line follow up referral on 04/24/2019.  Referral reason: Severe constipation, having a lot of pain, poor appetite, broken arm on 04/03/2019, and history of taking oxycodone.   Nurse Line advised patient of the following: increase fluids, fibers, fruits, take short walks, and follow up with primary care provider.  Nurse line follow up completed and no further care management needs.      Plan: RNCM will complete case closure due to follow up completed / no care management  needs.      Derwin Reddy H. Annia Friendly, BSN, [place] Management [facility] Telephonic CM Phone: [phone] Fax: [phone]

## 2019-05-01 DIAGNOSIS — S52202D Unspecified fracture of shaft of left ulna, subsequent encounter for closed fracture with routine healing: Secondary | ICD-10-CM | POA: Diagnosis not present

## 2019-05-11 DIAGNOSIS — S52202D Unspecified fracture of shaft of left ulna, subsequent encounter for closed fracture with routine healing: Secondary | ICD-10-CM | POA: Diagnosis not present

## 2019-05-11 DIAGNOSIS — S52502D Unspecified fracture of the lower end of left radius, subsequent encounter for closed fracture with routine healing: Secondary | ICD-10-CM | POA: Diagnosis not present

## 2019-05-26 DIAGNOSIS — S52202D Unspecified fracture of shaft of left ulna, subsequent encounter for closed fracture with routine healing: Secondary | ICD-10-CM | POA: Diagnosis not present

## 2019-05-28 ENCOUNTER — Ambulatory Visit: Payer: PPO | Admitting: Podiatry

## 2019-06-08 DIAGNOSIS — E039 Hypothyroidism, unspecified: Secondary | ICD-10-CM | POA: Diagnosis not present

## 2019-06-08 DIAGNOSIS — E1122 Type 2 diabetes mellitus with diabetic chronic kidney disease: Secondary | ICD-10-CM | POA: Diagnosis not present

## 2019-06-08 DIAGNOSIS — E1169 Type 2 diabetes mellitus with other specified complication: Secondary | ICD-10-CM | POA: Diagnosis not present

## 2019-06-08 DIAGNOSIS — N183 Chronic kidney disease, stage 3 (moderate): Secondary | ICD-10-CM | POA: Diagnosis not present

## 2019-06-08 DIAGNOSIS — E785 Hyperlipidemia, unspecified: Secondary | ICD-10-CM | POA: Diagnosis not present

## 2019-06-09 DIAGNOSIS — E785 Hyperlipidemia, unspecified: Secondary | ICD-10-CM | POA: Diagnosis not present

## 2019-06-09 DIAGNOSIS — E1169 Type 2 diabetes mellitus with other specified complication: Secondary | ICD-10-CM | POA: Diagnosis not present

## 2019-06-09 DIAGNOSIS — E039 Hypothyroidism, unspecified: Secondary | ICD-10-CM | POA: Diagnosis not present

## 2019-06-09 DIAGNOSIS — E1122 Type 2 diabetes mellitus with diabetic chronic kidney disease: Secondary | ICD-10-CM | POA: Diagnosis not present

## 2019-06-09 DIAGNOSIS — N183 Chronic kidney disease, stage 3 (moderate): Secondary | ICD-10-CM | POA: Diagnosis not present

## 2019-06-15 ENCOUNTER — Encounter: Payer: Self-pay | Admitting: Pulmonary Disease

## 2019-06-15 ENCOUNTER — Ambulatory Visit (INDEPENDENT_AMBULATORY_CARE_PROVIDER_SITE_OTHER): Payer: PPO | Admitting: Pulmonary Disease

## 2019-06-15 DIAGNOSIS — J449 Chronic obstructive pulmonary disease, unspecified: Secondary | ICD-10-CM | POA: Diagnosis not present

## 2019-06-15 DIAGNOSIS — J9611 Chronic respiratory failure with hypoxia: Secondary | ICD-10-CM

## 2019-06-15 DIAGNOSIS — S52202D Unspecified fracture of shaft of left ulna, subsequent encounter for closed fracture with routine healing: Secondary | ICD-10-CM | POA: Diagnosis not present

## 2019-06-15 NOTE — Patient Instructions (Signed)
Continue Advair, Spiriva as previously prescribed Continue oxygen therapy is close to 24 hours/day as possible Continue albuterol inhaler as needed Change azithromycin to every Monday, Wednesday, Friday Follow-up in 6 months.  Call sooner if needed

## 2019-06-15 NOTE — Progress Notes (Signed)
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  Mild obesity  DATA: Spirometry 2014: FEV1 0.78 liters (38% pred)  CT chest 07/06/13: mod - severe emphysema. Chronic RLL scarring CXR 05/12/15: Advanced chronic obstructive pulmonary disease with similar right basilar scarring. No acute findings CXR 04/04/17: No acute findings   INTERVAL HISTORY: Last visit 02/26/19.  No major pulmonary events since that time.  Virtual Visit via Telephone Note  I connected with [name] on 06/15/19 at  9:45 AM EDT by telephone and verified that I am speaking with the correct person using two identifiers. I discussed the limitations, risks, security and privacy concerns of performing an evaluation and management service by telephone and the availability of in person appointments. I also discussed with the patient that there may be a patient responsible charge related to this service. The patient expressed understanding and agreed to proceed.    SUBJ: This is a scheduled follow-up.  It was performed as a telephone encounter due to the coronavirus pandemic.  Since last visit she has had no major pulmonary events.  She did suffer a fall with a left wrist fracture in April and has had a cast on her left arm since that time.  Presently, she continues to have moderate-severe exertional dyspnea which is not changed from previously.  She denies cough or mucus production.  She has no chest pain or hemoptysis.  She denies fever, lower extremity edema, calf tenderness.  She is protecting herself from coronavirus, wearing a mask whenever she is out among crowds.  She has no new complaints.  She remains on Advair and Spiriva as maintenance inhalers.  She is taking azithromycin every day (last visit I instructed her to take it Monday Wednesday Friday).  She is wearing oxygen 24 hours/day at 3 LPM.  Since suffering the wrist fracture in April, she has great difficulty actuating the albuterol inhaler and therefore is  rarely using it.  She does not have a nebulizer at home.  She is to get the cast removed from her left arm sometime this week and therefore does not believe that she needs a nebulizer prescribed for her.  OBJ: There were no vitals filed for this visit.   Due to the remote nature of this encounter, no physical examination could be performed  DATA: BMP Latest Ref Rng & Units 10/19/2017 07/06/2013 07/05/2013  Glucose 65 - 99 mg/dL 129(H) 180(H) 180(H)  BUN 6 - 20 mg/dL 23(H) 17 15  Creatinine 0.44 - 1.00 mg/dL 1.46(H) 0.96 0.97  Sodium 135 - 145 mmol/L 135 143 142  Potassium 3.5 - 5.1 mmol/L 4.1 4.2 4.2  Chloride 101 - 111 mmol/L 97(L) 107 106  CO2 22 - 32 mmol/L 29 33(H) 30  Calcium 8.9 - 10.3 mg/dL 10.6(H) 9.8 9.7    CBC Latest Ref Rng & Units 10/19/2017 10/13/2014 07/06/2013  WBC 3.6 - 11.0 K/uL 11.2(H) 7.8 18.7(H)  Hemoglobin 12.0 - 16.0 g/dL 12.0 11.3(L) 15.1  Hematocrit 35.0 - 47.0 % 36.3 34.8(L) 45.1  Platelets 150 - 440 K/uL 156 174.0 166    CXR: No new film  IMPRESSION:   ICD-10-CM   1. Chronic hypoxemic respiratory failure (HCC)  J96.11   2. COPD, very severe ([place])  J44.9     PLAN: Continue Advair, Spiriva as previously prescribed Continue oxygen therapy is close to 24 hours/day as possible Continue albuterol inhaler as needed Instructed her to change azithromycin to every Monday, Wednesday, Friday Follow-up in 6 months.  Call sooner if  needed   Merton Border, MD PCCM service Mobile [phone] Pager [phone] 06/15/2019 9:57 AM

## 2019-06-19 ENCOUNTER — Telehealth: Payer: Self-pay | Admitting: Pulmonary Disease

## 2019-06-19 NOTE — Telephone Encounter (Signed)
I spoke to patient regarding her medication list. She stated she figured out what the medications were for and just didn't want it looking like she was on more medications than she needed to be. Nothing further is needed.

## 2019-06-24 ENCOUNTER — Other Ambulatory Visit: Payer: Self-pay | Admitting: Pulmonary Disease

## 2019-06-30 DIAGNOSIS — E039 Hypothyroidism, unspecified: Secondary | ICD-10-CM | POA: Diagnosis not present

## 2019-06-30 DIAGNOSIS — N183 Chronic kidney disease, stage 3 (moderate): Secondary | ICD-10-CM | POA: Diagnosis not present

## 2019-06-30 DIAGNOSIS — J9610 Chronic respiratory failure, unspecified whether with hypoxia or hypercapnia: Secondary | ICD-10-CM | POA: Diagnosis not present

## 2019-06-30 DIAGNOSIS — E1122 Type 2 diabetes mellitus with diabetic chronic kidney disease: Secondary | ICD-10-CM | POA: Diagnosis not present

## 2019-06-30 DIAGNOSIS — I10 Essential (primary) hypertension: Secondary | ICD-10-CM | POA: Diagnosis not present

## 2019-06-30 DIAGNOSIS — J42 Unspecified chronic bronchitis: Secondary | ICD-10-CM | POA: Diagnosis not present

## 2019-06-30 DIAGNOSIS — E785 Hyperlipidemia, unspecified: Secondary | ICD-10-CM | POA: Diagnosis not present

## 2019-06-30 DIAGNOSIS — F325 Major depressive disorder, single episode, in full remission: Secondary | ICD-10-CM | POA: Diagnosis not present

## 2019-06-30 DIAGNOSIS — E1169 Type 2 diabetes mellitus with other specified complication: Secondary | ICD-10-CM | POA: Diagnosis not present

## 2019-06-30 DIAGNOSIS — Z993 Dependence on wheelchair: Secondary | ICD-10-CM | POA: Diagnosis not present

## 2019-07-07 ENCOUNTER — Telehealth: Payer: Self-pay | Admitting: Pulmonary Disease

## 2019-07-07 NOTE — Telephone Encounter (Signed)
Spoke with pt who states she is not [place] and she did not call our office.

## 2019-07-08 NOTE — Telephone Encounter (Signed)
Spoke to [place] at [employer]. [phone]). She stated that there is an interaction between patients' azithromycin and Escialopram. Dr. Ouida Sills just prescribed escialporam 06/30/19. I will confirm with Dr. Alva Garnet that he wants her to stay on the azithromycin.

## 2019-07-08 NOTE — Telephone Encounter (Signed)
Attempted to call Sharyn Lull at [facility] per original message.  Reached a Advertising account executive at [employer], left message to call back. Will route to [place], RN in [place] to follow back up on this.

## 2019-07-08 NOTE — Telephone Encounter (Signed)
Left message at [facility]), to call us regarding this interaction.

## 2019-07-08 NOTE — Telephone Encounter (Signed)
We also received a fax regarding interaction. Dr. Alva Garnet response on fax was that he would NOT change the patient's current azithromycin.  I left a message on secure voicemail of above. Closing encounter.

## 2019-07-09 ENCOUNTER — Other Ambulatory Visit: Payer: Self-pay | Admitting: Pulmonary Disease

## 2019-07-09 NOTE — Telephone Encounter (Signed)
Those are low doses of both medications. I would like to continue azithromycin every MWF  [name]

## 2019-07-21 DIAGNOSIS — M25532 Pain in left wrist: Secondary | ICD-10-CM | POA: Diagnosis not present

## 2019-07-21 DIAGNOSIS — M810 Age-related osteoporosis without current pathological fracture: Secondary | ICD-10-CM | POA: Diagnosis not present

## 2019-07-21 DIAGNOSIS — G8929 Other chronic pain: Secondary | ICD-10-CM | POA: Diagnosis not present

## 2019-07-21 DIAGNOSIS — Z8781 Personal history of (healed) traumatic fracture: Secondary | ICD-10-CM | POA: Diagnosis not present

## 2019-07-22 ENCOUNTER — Telehealth: Payer: Self-pay | Admitting: Pulmonary Disease

## 2019-07-22 ENCOUNTER — Other Ambulatory Visit: Payer: Self-pay | Admitting: Pulmonary Disease

## 2019-07-22 MED ORDER — ALBUTEROL SULFATE HFA 108 (90 BASE) MCG/ACT IN AERS: 2.0000 | INHALATION_SPRAY | Freq: Four times a day (QID) | RESPIRATORY_TRACT | 1 refills | Status: DC | PRN

## 2019-07-22 NOTE — Telephone Encounter (Signed)
Rx for proair has been sent to preferred pharmacy.  Pt is aware and voiced her understanding. Nothing further is needed.

## 2019-07-27 DIAGNOSIS — M25532 Pain in left wrist: Secondary | ICD-10-CM | POA: Diagnosis not present

## 2019-07-27 DIAGNOSIS — M1812 Unilateral primary osteoarthritis of first carpometacarpal joint, left hand: Secondary | ICD-10-CM | POA: Diagnosis not present

## 2019-08-24 DIAGNOSIS — R58 Hemorrhage, not elsewhere classified: Secondary | ICD-10-CM | POA: Diagnosis not present

## 2019-08-24 DIAGNOSIS — M25551 Pain in right hip: Secondary | ICD-10-CM | POA: Diagnosis not present

## 2019-08-24 DIAGNOSIS — D0439 Carcinoma in situ of skin of other parts of face: Secondary | ICD-10-CM | POA: Diagnosis not present

## 2019-08-24 DIAGNOSIS — L820 Inflamed seborrheic keratosis: Secondary | ICD-10-CM | POA: Diagnosis not present

## 2019-08-24 DIAGNOSIS — M1611 Unilateral primary osteoarthritis, right hip: Secondary | ICD-10-CM | POA: Diagnosis not present

## 2019-08-24 DIAGNOSIS — D485 Neoplasm of uncertain behavior of skin: Secondary | ICD-10-CM | POA: Diagnosis not present

## 2019-09-17 ENCOUNTER — Telehealth: Payer: Self-pay | Admitting: Pulmonary Disease

## 2019-09-17 NOTE — Telephone Encounter (Signed)
Called and spoke to pt, who is questioning who Dr. Alva Garnet recommended that she see.  I have provided pt with our provider's name. Pt will research providers and will call us with a update.

## 2019-09-18 NOTE — Telephone Encounter (Signed)
Called and spoke to pt for update.  Pt stated that she would like to see Dr. Mortimer Fries. I have offered to scheduled an appointment with Dr. Mortimer Fries. Pt stated that she would like to call back to schedule.  I also received office notes from [facility] clinic with note that pt needs to be optimized for hip replacement.  Pt requested that we hold on to this, as she is unsure if she will have hip placement. She is considering holding off on surgery.  I have placed notes in scan folder to be scanned into pt chart. When pt is scheduled with Dr. Mortimer Fries, he will be able to review notes in epic. Nothing further is needed at this time.

## 2019-09-21 NOTE — Telephone Encounter (Signed)
Noted. Please see recent phone note 09/17/2019.  Nothing further needed at this time.

## 2019-09-30 ENCOUNTER — Telehealth: Payer: Self-pay | Admitting: Pulmonary Disease

## 2019-09-30 MED ORDER — AZITHROMYCIN 250 MG PO TABS: ORAL_TABLET | ORAL | 0 refills | Status: DC

## 2019-09-30 NOTE — Telephone Encounter (Signed)
Pt returning call.  [phone].

## 2019-09-30 NOTE — Telephone Encounter (Signed)
ATC pt-no answer with no option to leave vm. Will call back.  

## 2019-09-30 NOTE — Telephone Encounter (Signed)
Rx for azithro has been sent to preferred pharmacy.  Pt is aware and voiced her understanding.  Nothing further is needed.

## 2019-10-14 DIAGNOSIS — D0439 Carcinoma in situ of skin of other parts of face: Secondary | ICD-10-CM | POA: Diagnosis not present

## 2019-11-05 DIAGNOSIS — M109 Gout, unspecified: Secondary | ICD-10-CM | POA: Diagnosis not present

## 2019-11-18 DIAGNOSIS — M1611 Unilateral primary osteoarthritis, right hip: Secondary | ICD-10-CM | POA: Diagnosis not present

## 2019-11-25 DIAGNOSIS — I10 Essential (primary) hypertension: Secondary | ICD-10-CM | POA: Diagnosis not present

## 2019-11-25 DIAGNOSIS — E1122 Type 2 diabetes mellitus with diabetic chronic kidney disease: Secondary | ICD-10-CM | POA: Diagnosis not present

## 2019-11-25 DIAGNOSIS — J9610 Chronic respiratory failure, unspecified whether with hypoxia or hypercapnia: Secondary | ICD-10-CM | POA: Diagnosis not present

## 2019-11-25 DIAGNOSIS — N183 Chronic kidney disease, stage 3 unspecified: Secondary | ICD-10-CM | POA: Diagnosis not present

## 2019-11-25 DIAGNOSIS — E1169 Type 2 diabetes mellitus with other specified complication: Secondary | ICD-10-CM | POA: Diagnosis not present

## 2019-11-25 DIAGNOSIS — E785 Hyperlipidemia, unspecified: Secondary | ICD-10-CM | POA: Diagnosis not present

## 2019-12-04 DIAGNOSIS — E039 Hypothyroidism, unspecified: Secondary | ICD-10-CM | POA: Diagnosis not present

## 2019-12-04 DIAGNOSIS — E1169 Type 2 diabetes mellitus with other specified complication: Secondary | ICD-10-CM | POA: Diagnosis not present

## 2019-12-04 DIAGNOSIS — E785 Hyperlipidemia, unspecified: Secondary | ICD-10-CM | POA: Diagnosis not present

## 2019-12-04 DIAGNOSIS — J9610 Chronic respiratory failure, unspecified whether with hypoxia or hypercapnia: Secondary | ICD-10-CM | POA: Diagnosis not present

## 2019-12-04 DIAGNOSIS — N1831 Chronic kidney disease, stage 3a: Secondary | ICD-10-CM | POA: Diagnosis not present

## 2019-12-04 DIAGNOSIS — J42 Unspecified chronic bronchitis: Secondary | ICD-10-CM | POA: Diagnosis not present

## 2019-12-04 DIAGNOSIS — Z993 Dependence on wheelchair: Secondary | ICD-10-CM | POA: Diagnosis not present

## 2019-12-04 DIAGNOSIS — E1121 Type 2 diabetes mellitus with diabetic nephropathy: Secondary | ICD-10-CM | POA: Diagnosis not present

## 2019-12-04 DIAGNOSIS — Z0000 Encounter for general adult medical examination without abnormal findings: Secondary | ICD-10-CM | POA: Diagnosis not present

## 2019-12-04 DIAGNOSIS — F325 Major depressive disorder, single episode, in full remission: Secondary | ICD-10-CM | POA: Diagnosis not present

## 2019-12-04 DIAGNOSIS — I10 Essential (primary) hypertension: Secondary | ICD-10-CM | POA: Diagnosis not present

## 2019-12-07 ENCOUNTER — Other Ambulatory Visit: Payer: Self-pay

## 2019-12-07 ENCOUNTER — Ambulatory Visit (INDEPENDENT_AMBULATORY_CARE_PROVIDER_SITE_OTHER): Payer: PPO | Admitting: Internal Medicine

## 2019-12-07 ENCOUNTER — Encounter: Payer: Self-pay | Admitting: Internal Medicine

## 2019-12-07 DIAGNOSIS — J9611 Chronic respiratory failure with hypoxia: Secondary | ICD-10-CM

## 2019-12-07 DIAGNOSIS — Z01811 Encounter for preprocedural respiratory examination: Secondary | ICD-10-CM

## 2019-12-07 DIAGNOSIS — J449 Chronic obstructive pulmonary disease, unspecified: Secondary | ICD-10-CM | POA: Diagnosis not present

## 2019-12-07 DIAGNOSIS — R0602 Shortness of breath: Secondary | ICD-10-CM

## 2019-12-07 NOTE — Progress Notes (Signed)
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 (24/7) Mild obesity  DATA: Spirometry 2014: FEV1 0.78 liters (38% pred)  CT chest 07/06/13: mod - severe emphysema. Chronic RLL scarring CXR 05/12/15: Advanced chronic obstructive pulmonary disease with similar right basilar scarring. No acute findings CXR 04/04/17: No acute findings   INTERVAL HISTORY: Last visit 02/26/19.  No major pulmonary events since that time.   I connected with the patient by telephone enabled telemedicine visit and verified that I am speaking with the correct person using two identifiers.    I discussed the limitations, risks, security and privacy concerns of performing an evaluation and management service by telemedicine and the availability of in-person appointments. I also discussed with the patient that there may be a patient responsible charge related to this service. The patient expressed understanding and agreed to proceed.  PATIENT AGREES AND CONFIRMS -YES   Other persons participating in the visit and their role in the encounter: Patient, nursing  This visit type was conducted due to national recommendations for restrictions regarding the COVID-19 Pandemic (e.g. social distancing).  This format is felt to be most appropriate for this patient at this time.  All issues noted in this document were discussed and addressed.      CC Follow-up COPD Follow-up hypoxic respiratory failure  HPI COPD seems to be stable at this time but has moderate to severe exertional dyspnea This is not changed over the last couple years Patient continues to use Advair and Spiriva and this seems to be controlling her COPD Patient is on oxygen 24/7 at 3 L nasal cannula This was increased to 3 L over the last several months Patient uses and benefits from oxygen therapy and needs this for survival  She is protecting herself from coronavirus wearing a mask where she goes out   No  COPD exacerbation at this time No evidence  of heart failure at this time No evidence or signs of infection at this time No respiratory distress No fevers, chills, nausea, vomiting, diarrhea No evidence of lower extremity edema No evidence hemoptysis    OBJ: There were no vitals filed for this visit.      Current Outpatient Medications:  .  ADVAIR DISKUS 250-50 MCG/DOSE AEPB, Inhale 1 puff by mouth into the lungs twice a day, Disp: 180 each, Rfl: 1 .  albuterol (PROAIR HFA) 108 (90 Base) MCG/ACT inhaler, Inhale 2 puffs into the lungs every 6 (six) hours as needed for wheezing or shortness of breath., Disp: 54 g, Rfl: 1 .  atorvastatin (LIPITOR) 20 MG tablet, Take 20 mg by mouth daily., Disp: , Rfl:  .  azithromycin (ZITHROMAX) 250 MG tablet, Take one pill every Monday, Wednesday and Friday., Disp: 38 each, Rfl: 0 .  carvedilol (COREG) 6.25 MG tablet, Take 6.25 mg by mouth 2 (two) times daily with a meal., Disp: , Rfl:  .  doxycycline (VIBRA-TABS) 100 MG tablet, Take 1 tablet (100 mg total) by mouth 2 (two) times daily., Disp: 10 tablet, Rfl: 0 .  ondansetron (ZOFRAN ODT) 4 MG disintegrating tablet, Take 1 tablet (4 mg total) by mouth every 8 (eight) hours as needed., Disp: 20 tablet, Rfl: 0 .  oxyCODONE (ROXICODONE) 5 MG immediate release tablet, Take 1 tablet (5 mg total) by mouth every 8 (eight) hours as needed., Disp: 20 tablet, Rfl: 0 .  potassium chloride (MICRO-K) 10 MEQ CR capsule, Take 10 mEq by mouth daily., Disp: , Rfl:  .  SPIRIVA RESPIMAT 2.5 MCG/ACT AERS, INHALE  2 PUFFS INTO THE LUNGS DAILY, Disp: 12 g, Rfl: 2 .  torsemide (DEMADEX) 20 MG tablet, Take 20 mg by mouth daily., Disp: , Rfl:  No current facility-administered medications for this visit.   Facility-Administered Medications Ordered in Other Visits:  .  albuterol (PROVENTIL) (2.5 MG/3ML) 0.083% nebulizer solution 2.5 mg, 2.5 mg, Nebulization, Once, Wilhelmina Mcardle, MD     IMPRESSION: Severe end-stage COPD FEV1 30% 6 years ago Gold stage C Continue  inhalers as prescribed No signs of exacerbation at this time No indication for steroids or antibiotics at this time   Chronic hypoxic respiratory failure Patient uses and benefits from oxygen therapy She needs this for survival   Prior to at my assessment, patient has relayed to me and is very reluctant and hesitant to undergo the surgery at this time and her pain is controlled with Tylenol as needed.  Preop pulmonary assessment for hip replacement Patient is a moderate to high risk for postop and Intra-Op complications due to her severe COPD Patient is at optimal medical management at this time for her COPD  General Risk Reduction Strategies: - All patients warrant post-operative incentive spirometry. For those with obstruction, also consider flutter valve. - Early ambulation, PT/OT - DVT prophylaxis where appropriate - Adequate pain control without oversedation   COVID-19 EDUCATION: The signs and symptoms of COVID-19 were discussed with the patient and how to seek care for testing.  The importance of social distancing was discussed today. Hand Washing Techniques and avoid touching face was advised.     MEDICATION ADJUSTMENTS/LABS AND TESTS ORDERED: Continue inhalers as prescribed Continue oxygen as prescribed   CURRENT MEDICATIONS REVIEWED AT LENGTH WITH PATIENT TODAY   Patient satisfied with Plan of action and management. All questions answered  Follow up in 6 months Total time spent 23 minutes  Corrin Parker, M.D.  [facility]  Medical Director [place] Director [facility] Cardio-Pulmonary Department

## 2019-12-07 NOTE — Patient Instructions (Signed)
Preop pulmonary assessment for hip replacement Patient is a moderate to high risk for postop and Intra-Op complications due to her severe COPD  General Risk Reduction Strategies: - All patients warrant post-operative incentive spirometry. For those with obstruction, also consider flutter valve. - Early ambulation, PT/OT - DVT prophylaxis where appropriate - Adequate pain control without oversedation   COVID-19 EDUCATION: The signs and symptoms of COVID-19 were discussed with the patient and how to seek care for testing.  The importance of social distancing was discussed today. Hand Washing Techniques and avoid touching face was advised.     MEDICATION ADJUSTMENTS/LABS AND TESTS ORDERED: Continue inhalers as prescribed Continue oxygen as prescribed

## 2020-01-28 ENCOUNTER — Telehealth: Payer: Self-pay | Admitting: Internal Medicine

## 2020-01-28 MED ORDER — SPIRIVA RESPIMAT 2.5 MCG/ACT IN AERS: INHALATION_SPRAY | RESPIRATORY_TRACT | 6 refills | Status: DC

## 2020-01-28 MED ORDER — FLUTICASONE-SALMETEROL 250-50 MCG/DOSE IN AEPB: INHALATION_SPRAY | RESPIRATORY_TRACT | 1 refills | Status: DC

## 2020-01-28 MED ORDER — AZITHROMYCIN 250 MG PO TABS: ORAL_TABLET | ORAL | 0 refills | Status: DC

## 2020-01-28 NOTE — Telephone Encounter (Signed)
Called and made pt aware that rx was sent to her pharmacy. Patient verbalized understanding. Nothing further needed.

## 2020-01-28 NOTE — Telephone Encounter (Signed)
Pt is requesting refills on spiriva, advair and azithromycin, however I do not see Azithromycin mentioned in last OV. Azithromycin was prescribed by Dr. Alva Garnet for three times weekly.  Dr. Mortimer Fries please advise if okay to refill. Thanks

## 2020-01-28 NOTE — Telephone Encounter (Signed)
Ok to refill 

## 2020-03-21 ENCOUNTER — Telehealth: Payer: Self-pay | Admitting: Internal Medicine

## 2020-03-21 NOTE — Telephone Encounter (Signed)
Just want to confirm the pharm- is is called [facility]? LMTCB

## 2020-03-22 MED ORDER — SPIRIVA RESPIMAT 2.5 MCG/ACT IN AERS: INHALATION_SPRAY | RESPIRATORY_TRACT | 3 refills | Status: DC

## 2020-03-22 MED ORDER — FLUTICASONE-SALMETEROL 250-50 MCG/DOSE IN AEPB: INHALATION_SPRAY | RESPIRATORY_TRACT | 3 refills | Status: DC

## 2020-03-22 NOTE — Telephone Encounter (Signed)
Called and spoke to pt. Pt did confirm the pharmacy is [facility]. Both rxs sent to pharmacy for 90 day. Pt verbalized understanding and denied any further questions or concerns at this time.

## 2020-03-23 ENCOUNTER — Telehealth: Payer: Self-pay | Admitting: Internal Medicine

## 2020-03-24 NOTE — Telephone Encounter (Signed)
Error

## 2020-04-19 ENCOUNTER — Encounter: Payer: Self-pay | Admitting: Internal Medicine

## 2020-04-19 ENCOUNTER — Ambulatory Visit (INDEPENDENT_AMBULATORY_CARE_PROVIDER_SITE_OTHER): Payer: PPO | Admitting: Internal Medicine

## 2020-04-19 DIAGNOSIS — J9611 Chronic respiratory failure with hypoxia: Secondary | ICD-10-CM

## 2020-04-19 DIAGNOSIS — J449 Chronic obstructive pulmonary disease, unspecified: Secondary | ICD-10-CM | POA: Diagnosis not present

## 2020-04-19 NOTE — Progress Notes (Signed)
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 (24/7) Mild obesity   I connected with the patient by telephone enabled telemedicine visit and verified that I am speaking with the correct person using two identifiers.    I discussed the limitations, risks, security and privacy concerns of performing an evaluation and management service by telemedicine and the availability of in-person appointments. I also discussed with the patient that there may be a patient responsible charge related to this service. The patient expressed understanding and agreed to proceed.  PATIENT AGREES AND CONFIRMS -YES   Other persons participating in the visit and their role in the encounter: Patient, nursing  This visit type was conducted due to national recommendations for restrictions regarding the COVID-19 Pandemic (e.g. social distancing).  This format is felt to be most appropriate for this patient at this time.  All issues noted in this document were discussed and addressed.       DATA: Spirometry 2014: FEV1 0.78 liters (38% pred)  CT chest 07/06/13: mod - severe emphysema. Chronic RLL scarring CXR 05/12/15: Advanced chronic obstructive pulmonary disease with similar right basilar scarring. No acute findings CXR 04/04/17: No acute findings   INTERVAL HISTORY: Last visit 02/26/19.  No major pulmonary events since that time.   CC Follow up COPD Follow up hypoxic respiratory failure   HPI COPD seems to be stable at this time moderate to severe exertional dyspnea Continues to use Advair and Spiriva and this seems to be helping  Patient uses oxygen therapy 24/7 at 3L [place] Patient uses and benefits from oxygen therapy and needs this for survival  No  COPD exacerbation at this time No evidence of heart failure at this time No evidence or signs of infection at this time No respiratory distress No fevers, chills, nausea, vomiting, diarrhea No evidence of lower extremity  edema No evidence hemoptysis   Discussed with patient, regarding Hip surgery She is moderate to high risk for Pulmonary Complications   OBJ: There were no vitals filed for this visit.      Current Outpatient Medications:  .  albuterol (PROAIR HFA) 108 (90 Base) MCG/ACT inhaler, Inhale 2 puffs into the lungs every 6 (six) hours as needed for wheezing or shortness of breath., Disp: 54 g, Rfl: 1 .  atorvastatin (LIPITOR) 20 MG tablet, Take 20 mg by mouth daily., Disp: , Rfl:  .  azithromycin (ZITHROMAX) 250 MG tablet, Take one pill every Monday, Wednesday and Friday., Disp: 38 each, Rfl: 0 .  carvedilol (COREG) 6.25 MG tablet, Take 6.25 mg by mouth 2 (two) times daily with a meal., Disp: , Rfl:  .  doxycycline (VIBRA-TABS) 100 MG tablet, Take 1 tablet (100 mg total) by mouth 2 (two) times daily., Disp: 10 tablet, Rfl: 0 .  Fluticasone-Salmeterol (ADVAIR DISKUS) 250-50 MCG/DOSE AEPB, Inhale 1 puff by mouth into the lungs twice a day, Disp: 180 each, Rfl: 3 .  ondansetron (ZOFRAN ODT) 4 MG disintegrating tablet, Take 1 tablet (4 mg total) by mouth every 8 (eight) hours as needed., Disp: 20 tablet, Rfl: 0 .  potassium chloride (MICRO-K) 10 MEQ CR capsule, Take 10 mEq by mouth daily., Disp: , Rfl:  .  Tiotropium Bromide Monohydrate (SPIRIVA RESPIMAT) 2.5 MCG/ACT AERS, INHALE 2 PUFFS INTO THE LUNGS DAILY, Disp: 12 g, Rfl: 3 .  torsemide (DEMADEX) 20 MG tablet, Take 20 mg by mouth daily., Disp: , Rfl:  No current facility-administered medications for this visit.  Facility-Administered Medications Ordered in Other  Visits:  .  albuterol (PROVENTIL) (2.5 MG/3ML) 0.083% nebulizer solution 2.5 mg, 2.5 mg, Nebulization, Once, Wilhelmina Mcardle, MD     IMPRESSION:  Severe end-stage COPD FEV1 30% 6 years ago Gold stage D Continue inhalers as prescribed No signs of exacerbation at this time No indication for steroids or antibiotics at this time  Chronic hypoxic respiratory failure Patient had  to increase her oxygen requirement to 3 L nasal cannula She needs this 24/7 She needs this for survival  Preop surgical evaluation Once again patient was explained to her the assessments and risks for her surgical procedure if she were to undergo hip replacement Patient is very reluctant and hesitant to undergo surgery at this time  Preop pulmonary assessment for hip replacement Patient is a moderate to high risk for postop and Intra-Op complications due to her severe COPD Patient is at optimal medical management at this time for her COPD  General risk reduction strategies All patients want postop incentive spirometry Postop flutter valve Early ambulation with PT OT DVT prophylaxis when appropriate Adequate pain control without oversedation    COVID-19 EDUCATION: The signs and symptoms of COVID-19 were discussed with the patient and how to seek care for testing.  The importance of social distancing was discussed today. Hand Washing Techniques and avoid touching face was advised.     MEDICATION ADJUSTMENTS/LABS AND TESTS ORDERED: Continue inhalers as prescribed Continue oxygen as prescribed Moderate to high risk postop and Intra-Op complications   CURRENT MEDICATIONS REVIEWED AT LENGTH WITH PATIENT TODAY   Patient satisfied with Plan of action and management. All questions answered  Follow-up in 6 months Total time spent 22 minutes  Maretta Bees Patricia Pesa, M.D.  [facility]  Medical Director [place] Director [facility] Cardio-Pulmonary Department

## 2020-04-19 NOTE — Patient Instructions (Signed)
Continue oxygen as prescribed Continue inhalers as prescribed 

## 2020-05-31 DIAGNOSIS — E1122 Type 2 diabetes mellitus with diabetic chronic kidney disease: Secondary | ICD-10-CM | POA: Diagnosis not present

## 2020-05-31 DIAGNOSIS — J9610 Chronic respiratory failure, unspecified whether with hypoxia or hypercapnia: Secondary | ICD-10-CM | POA: Diagnosis not present

## 2020-05-31 DIAGNOSIS — N1831 Chronic kidney disease, stage 3a: Secondary | ICD-10-CM | POA: Diagnosis not present

## 2020-05-31 DIAGNOSIS — E039 Hypothyroidism, unspecified: Secondary | ICD-10-CM | POA: Diagnosis not present

## 2020-06-02 ENCOUNTER — Ambulatory Visit: Payer: Self-pay | Admitting: Podiatry

## 2020-06-06 ENCOUNTER — Other Ambulatory Visit: Payer: Self-pay

## 2020-06-06 ENCOUNTER — Ambulatory Visit (INDEPENDENT_AMBULATORY_CARE_PROVIDER_SITE_OTHER): Payer: PPO | Admitting: Podiatry

## 2020-06-06 ENCOUNTER — Encounter: Payer: Self-pay | Admitting: Podiatry

## 2020-06-06 DIAGNOSIS — N183 Chronic kidney disease, stage 3 unspecified: Secondary | ICD-10-CM

## 2020-06-06 DIAGNOSIS — M79675 Pain in left toe(s): Secondary | ICD-10-CM | POA: Diagnosis not present

## 2020-06-06 DIAGNOSIS — M79674 Pain in right toe(s): Secondary | ICD-10-CM

## 2020-06-06 DIAGNOSIS — E1122 Type 2 diabetes mellitus with diabetic chronic kidney disease: Secondary | ICD-10-CM

## 2020-06-06 DIAGNOSIS — B351 Tinea unguium: Secondary | ICD-10-CM | POA: Diagnosis not present

## 2020-06-06 NOTE — Progress Notes (Addendum)
This patient returns to my office for at risk foot care.  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  She is accompanied by caretaker   This patient is unable to cut nails herself since the patient cannot reach her nails.These nails are painful walking and wearing shoes.  This patient presents for at risk foot care today.  General Appearance  Alert, conversant and in no acute stress.  Vascular  Dorsalis pedis and posterior tibial  pulses are palpable  bilaterally.  Capillary return is within normal limits  bilaterally. Temperature is within normal limits  bilaterally.  Neurologic  Senn-Weinstein monofilament wire test within normal limits  bilaterally. Muscle power within normal limits bilaterally.  Nails Thick disfigured discolored nails with subungual debris  hallux  bilaterally. No evidence of bacterial infection or drainage bilaterally.  Orthopedic  No limitations of motion  feet .  No crepitus or effusions noted.  No bony pathology or digital deformities noted. HAV  B/L.  Skin  normotropic skin with no porokeratosis noted bilaterally.  No signs of infections or ulcers noted.     Onychomycosis  Pain in right toes  Pain in left toes  Consent was obtained for treatment procedures.   Mechanical debridement of nails 1-5  bilaterally performed with a nail nipper.  Filed with dremel without incident.    Return office visit    prn                 Told patient to return for periodic foot care and evaluation due to potential at risk complications.   Gardiner Barefoot DPM

## 2020-06-07 DIAGNOSIS — I10 Essential (primary) hypertension: Secondary | ICD-10-CM | POA: Diagnosis not present

## 2020-06-07 DIAGNOSIS — F325 Major depressive disorder, single episode, in full remission: Secondary | ICD-10-CM | POA: Diagnosis not present

## 2020-06-07 DIAGNOSIS — N1831 Chronic kidney disease, stage 3a: Secondary | ICD-10-CM | POA: Diagnosis not present

## 2020-06-07 DIAGNOSIS — E039 Hypothyroidism, unspecified: Secondary | ICD-10-CM | POA: Diagnosis not present

## 2020-06-07 DIAGNOSIS — E785 Hyperlipidemia, unspecified: Secondary | ICD-10-CM | POA: Diagnosis not present

## 2020-06-07 DIAGNOSIS — E1169 Type 2 diabetes mellitus with other specified complication: Secondary | ICD-10-CM | POA: Diagnosis not present

## 2020-06-07 DIAGNOSIS — E1122 Type 2 diabetes mellitus with diabetic chronic kidney disease: Secondary | ICD-10-CM | POA: Diagnosis not present

## 2020-06-07 DIAGNOSIS — J42 Unspecified chronic bronchitis: Secondary | ICD-10-CM | POA: Diagnosis not present

## 2020-06-07 DIAGNOSIS — J9610 Chronic respiratory failure, unspecified whether with hypoxia or hypercapnia: Secondary | ICD-10-CM | POA: Diagnosis not present

## 2020-08-25 ENCOUNTER — Other Ambulatory Visit: Payer: Self-pay | Admitting: Internal Medicine

## 2020-09-02 ENCOUNTER — Telehealth: Payer: Self-pay | Admitting: Pulmonary Disease

## 2020-09-02 NOTE — Telephone Encounter (Signed)
Called by Hoyle Sauer at 7:15 PM that she has water coming out of her oxygen machine. She has been in touch with her DME and they told her that they do no service the equipment and that it would need to be replaced. Replacement would entail re-certification of medical need by her physician. I am not in a position to do this on a Friday evening from [place]. I told her that she would have to call Dr. Mortimer Fries on Tuesday morning for further instructions. In the event that the machine stops functioning and does not supply oxygen or she became more SOB, she was instructed to go to the Emergency Department for further evaluation.

## 2020-09-06 ENCOUNTER — Telehealth: Payer: Self-pay | Admitting: Internal Medicine

## 2020-09-06 NOTE — Telephone Encounter (Signed)
Left message for patient

## 2020-09-06 NOTE — Telephone Encounter (Signed)
Pt's daughter-in-law Chong Sicilian called for patient.  [facility] stated she need to requalify.  [phone] (patient).  Pt seen Dr. Mortimer Fries in April.  Hard for patient to come in for appt, possibly phone visit.  Please advise.

## 2020-09-07 ENCOUNTER — Other Ambulatory Visit: Payer: Self-pay

## 2020-09-07 ENCOUNTER — Ambulatory Visit: Payer: PPO | Admitting: Adult Health

## 2020-09-07 ENCOUNTER — Encounter: Payer: Self-pay | Admitting: Adult Health

## 2020-09-07 VITALS — BP 100/60 | HR 67 | Temp 97.1°F | Ht 62.0 in | Wt 118.6 lb

## 2020-09-07 DIAGNOSIS — R0602 Shortness of breath: Secondary | ICD-10-CM

## 2020-09-07 DIAGNOSIS — J449 Chronic obstructive pulmonary disease, unspecified: Secondary | ICD-10-CM

## 2020-09-07 DIAGNOSIS — J9611 Chronic respiratory failure with hypoxia: Secondary | ICD-10-CM

## 2020-09-07 NOTE — Telephone Encounter (Signed)
Pt is calling back  From yesterday from [place] that she can't breathe - her tank is broken - starting to panic about it -

## 2020-09-07 NOTE — Progress Notes (Signed)
@Patient  ID: [name], female    DOB: [birth year], 79 y.o.   MRN: [id number]  Chief Complaint  Patient presents with  . Follow-up    Referring provider: [name], MD  HPI: 79 year old female former smoker followed for COPD with emphysema and chronic respiratory failure on oxygen  TEST/EVENTS :  Spirometry 2014: FEV1 0.78 liters (38% pred)  CT chest 07/06/13: mod - severe emphysema. Chronic RLL scarring CXR 05/12/15: Advanced chronic obstructive pulmonary disease with similar right basilar scarring. No acute findings CXR 04/04/17: No acute findings  09/07/2020 Follow up : COPD , o2 RF  Patient returns for a 47-month follow-up.  Patient has underlying COPD.  Says overall breathing is doing about the same.  She is had no flare of cough or wheezing.  She is somewhat sedentary.  Tries to do some light activities but gets winded very easily.  She remains on oxygen 3 L. She is recently moved in with her daughter.  She remains on Advair and Spiriva.  Previously was on azithromycin 3 days a week however has not been on this for over 6 months.  Says she has had no flare in her cough or wheezing.  No recent antibiotic use.  Insurance is requiring a oxygen qualification.  As her concentrator has recently broken and needs a new concentrator.  O2 saturations on room air are 84%.  She required 3 L of oxygen to maintain O2 saturations greater than 88 to 90%.     Allergies  Allergen Reactions  . Mucinex D [Pseudoephedrine-Guaifenesin Er]     Rash on neck    Immunization History  Administered Date(s) Administered  . Fluad Quad(high Dose 65+) 10/08/2019  . Influenza Split 10/24/2013  . Influenza, High Dose Seasonal PF 09/24/2016, 10/20/2018  . Influenza,inj,Quad PF,6+ Mos 10/13/2014, 09/26/2015  . PFIZER SARS-COV-2 Vaccination 02/22/2020, 03/14/2020  . Pneumococcal-Unspecified 03/24/2004    Past Medical History:  Diagnosis Date  . COPD (chronic obstructive pulmonary  disease) ([place])   . Environmental allergies   . High blood pressure   . High cholesterol     Tobacco History: Social History   Tobacco Use  Smoking Status Former Smoker  . Packs/day: 1.00  . Years: 45.00  . Pack years: 45.00  . Types: Cigarettes  . Quit date: 07/03/2013  . Years since quitting: 7.1  Smokeless Tobacco Never Used   Counseling given: Not Answered   Outpatient Medications Prior to Visit  Medication Sig Dispense Refill  . albuterol (PROAIR HFA) 108 (90 Base) MCG/ACT inhaler Inhale 2 puffs into the lungs every 6 (six) hours as needed for wheezing or shortness of breath. 54 g 1  . atorvastatin (LIPITOR) 20 MG tablet Take 20 mg by mouth daily.    Marland Kitchen azithromycin (ZITHROMAX) 250 MG tablet Take one pill every Monday, Wednesday and Friday. 38 each 0  . carvedilol (COREG) 6.25 MG tablet Take 6.25 mg by mouth 2 (two) times daily with a meal.    . Fluticasone-Salmeterol (ADVAIR DISKUS) 250-50 MCG/DOSE AEPB INHALE 1 PUFF BY MOUTH INTO THE LUNGS TWICE DAILY 180 each 3  . ondansetron (ZOFRAN ODT) 4 MG disintegrating tablet Take 1 tablet (4 mg total) by mouth every 8 (eight) hours as needed. 20 tablet 0  . potassium chloride (MICRO-K) 10 MEQ CR capsule Take 10 mEq by mouth daily.    . Tiotropium Bromide Monohydrate (SPIRIVA RESPIMAT) 2.5 MCG/ACT AERS INHALE 2 PUFFS INTO THE LUNGS DAILY 12 g 3  . torsemide (DEMADEX) 20 MG  tablet Take 20 mg by mouth daily.    Marland Kitchen doxycycline (VIBRA-TABS) 100 MG tablet Take 1 tablet (100 mg total) by mouth 2 (two) times daily. (Patient not taking: Reported on 09/07/2020) 10 tablet 0   Facility-Administered Medications Prior to Visit  Medication Dose Route Frequency Provider Last Rate Last Admin  . albuterol (PROVENTIL) (2.5 MG/3ML) 0.083% nebulizer solution 2.5 mg  2.5 mg Nebulization Once Wilhelmina Mcardle, MD         Review of Systems:   Constitutional:   No  weight loss, night sweats,  Fevers, chills,  +fatigue, or  lassitude.  HEENT:   No  headaches,  Difficulty swallowing,  Tooth/dental problems, or  Sore throat,                No sneezing, itching, ear ache, nasal congestion, post nasal drip,   CV:  No chest pain,  Orthopnea, PND, swelling in lower extremities, anasarca, dizziness, palpitations, syncope.   GI  No heartburn, indigestion, abdominal pain, nausea, vomiting, diarrhea, change in bowel habits, loss of appetite, bloody stools.   Resp:  .  No chest wall deformity  Skin: no rash or lesions.  GU: no dysuria, change in color of urine, no urgency or frequency.  No flank pain, no hematuria   MS:  No joint pain or swelling.  No decreased range of motion.  No back pain.    Physical Exam  BP 100/60 (BP Location: Left Arm, Patient Position: Sitting, Cuff Size: Normal)   Pulse 67   Temp (!) 97.1 F (36.2 C) (Temporal)   Ht 5\' 2"  (1.575 m)   Wt 118 lb 9.6 oz (53.8 kg)   SpO2 (S) (!) 84% Comment: RA, 97% on 2L  BMI 21.69 kg/m   GEN: A/Ox3; pleasant , NAD   HEENT:  [place]/AT, NOSE-clear, THROAT-clear, no lesions, no postnasal drip or exudate noted.   NECK:  Supple w/ fair ROM; no JVD; normal carotid impulses w/o bruits; no thyromegaly or nodules palpated; no lymphadenopathy.    RESP  Clear  P & A; w/o, wheezes/ rales/ or rhonchi. no accessory muscle use, no dullness to percussion  CARD:  RRR, no m/r/g, no peripheral edema, pulses intact, no cyanosis or clubbing.  GI:   Soft & nt; nml bowel sounds; no organomegaly or masses detected.   Musco: Warm bil, no deformities or joint swelling noted.   Neuro: alert, no focal deficits noted.    Skin: Warm, no lesions or rashes    Lab Results:    ProBNP No results found for: PROBNP  Imaging: No results found.    No flowsheet data found.  No results found for: NITRICOXIDE      Assessment & Plan:   No problem-specific Assessment & Plan notes found for this encounter.     Rexene Edison, NP 09/07/2020

## 2020-09-07 NOTE — Patient Instructions (Addendum)
Continue on Advair 1 puff Twice daily  , rinse after use.  Continue on Spiriva daily .  Continue on Oxygen 3l/m  Order for new oxygen concentrator.  Activity as tolerated.  Follow up with Dr. Mortimer Fries in 6 months and As needed

## 2020-09-07 NOTE — Telephone Encounter (Signed)
Spoke with patient, she states she moved over the weekend and began having problems with her oxygen concentrator, it began leaking water into her tubing with her oxygen.  She called [facility] to have it serviced and was told that it was now hers and they could not send anyone out to service it, she would have to be re certified to get a new concentrator.  Patient scheduled to come into the office to be re certified today at 3pm to see Tammy and to get re certified for her oxygen.  Nothing further needed.

## 2020-09-07 NOTE — Assessment & Plan Note (Addendum)
Compensated on present regimen Check cxr on return   Plan  Patient Instructions  Continue on Advair 1 puff Twice daily  , rinse after use.  Continue on Spiriva daily .  Continue on Oxygen 3l/m  Order for new oxygen concentrator.  Activity as tolerated.  Follow up with Dr. Mortimer Fries in 6 months and As needed

## 2020-09-07 NOTE — Assessment & Plan Note (Signed)
Continue on oxygen 3 L.  O2 saturations 84% on room air.  Required 3 L to maintain oxygen above 88 to 90%.  Plan  Patient Instructions  Continue on Advair 1 puff Twice daily  , rinse after use.  Continue on Spiriva daily .  Continue on Oxygen 3l/m  Order for new oxygen concentrator.  Activity as tolerated.  Follow up with Dr. Mortimer Fries in 6 months and As needed

## 2020-09-07 NOTE — Telephone Encounter (Signed)
Patient seen in the office today, completed walk test and orders sent to [facility] for a new concentrator, noting further needed.

## 2020-09-07 NOTE — Telephone Encounter (Signed)
I called the pt and she did not answer-LMTCB

## 2020-09-08 DIAGNOSIS — J449 Chronic obstructive pulmonary disease, unspecified: Secondary | ICD-10-CM | POA: Diagnosis not present

## 2020-10-08 DIAGNOSIS — J449 Chronic obstructive pulmonary disease, unspecified: Secondary | ICD-10-CM | POA: Diagnosis not present

## 2020-10-24 ENCOUNTER — Telehealth: Payer: Self-pay | Admitting: Internal Medicine

## 2020-10-24 MED ORDER — PREDNISONE 10 MG (21) PO TBPK: ORAL_TABLET | ORAL | 0 refills | Status: DC

## 2020-10-24 MED ORDER — AZITHROMYCIN 250 MG PO TABS: ORAL_TABLET | ORAL | 0 refills | Status: AC

## 2020-10-24 NOTE — Telephone Encounter (Signed)
Recommend prednisone taper pack 21 tablets directed in the package.  Also recommend a Azithromycin taper pack.  If she worsens in any way needs to be seen in the emergency room.  Make appointment for next week with either me or APP for follow-up.

## 2020-10-24 NOTE — Telephone Encounter (Signed)
Spoke to patient, who reports of increased sob with exertion, prodcutive cough with green to yellow mucus and feeling sluggish x2w. Denied fever, chills or sweats. Patient has increased oxygen to 4L. Spo2 was 88% on 4L this morning after mild exertion. She recovered with rest.  patient checked oxygen level during our conversation and spo2 was 93% HR 66 on 4L. She has had both covid vaccines.  Taking advair BID, spiriva daily and albuterol HFA BID with no relief.  Dr. Patsey Berthold, please advise, as Dr. Mortimer Fries is unavailable.

## 2020-10-24 NOTE — Telephone Encounter (Signed)
Patient is aware of recommendations and voiced her understanding.  Rx for zpak and prednisone has been sent preferred pharmacy.  Appt scheduled for 10/31/2020 at 2:00. Nothing further needed.

## 2020-10-24 NOTE — Telephone Encounter (Signed)
Lm for patient.  

## 2020-10-31 ENCOUNTER — Encounter: Payer: Self-pay | Admitting: Pulmonary Disease

## 2020-10-31 ENCOUNTER — Other Ambulatory Visit: Payer: Self-pay

## 2020-10-31 ENCOUNTER — Ambulatory Visit: Payer: PPO | Admitting: Pulmonary Disease

## 2020-10-31 VITALS — BP 110/62 | HR 71 | Temp 97.5°F | Ht 62.0 in | Wt 115.4 lb

## 2020-10-31 DIAGNOSIS — J9611 Chronic respiratory failure with hypoxia: Secondary | ICD-10-CM

## 2020-10-31 DIAGNOSIS — J449 Chronic obstructive pulmonary disease, unspecified: Secondary | ICD-10-CM | POA: Diagnosis not present

## 2020-10-31 DIAGNOSIS — J42 Unspecified chronic bronchitis: Secondary | ICD-10-CM

## 2020-10-31 NOTE — Patient Instructions (Signed)
We have updated your medication list  Continue oxygen at 3 L/min  Your regular COPD medications   Follow-up in 4 to 6 weeks time call sooner should any new problems arise

## 2020-10-31 NOTE — Progress Notes (Signed)
Subjective:    Patient ID: [name], female    DOB: 1941/01/25, 79 y.o.   MRN: [id number]  HPI Patient is an 79 year old former smoker with very severe COPD, patient of Dr. Patricia Pesa, who presents for follow-up after recent exacerbation of the same.  Patient was prescribed azithromycin and prednisone after calling in on 25 October due to increased shortness of breath with productive cough which was purulent at that time.  She presents today for evaluation after this acute illness.  She actually did well being treated at home.  Initially her oxygen saturations at home were 88% on 4 L however she is back down to her baseline of 3 L. Saturations today on 3 L/min are  93%.  She voices no active complaint.  States that she is doing markedly better since treatment.   TESTS/EVENTS :  Spirometry 2014: FEV1 0.78 liters (38% pred)  CT chest 07/06/13: mod - severe emphysema. Chronic RLL scarring CXR 05/12/15: Advanced chronic obstructive pulmonary disease with similar right basilar scarring. No acute findings CXR 04/04/17: No acute findings 09/07/2020 oxygen requalification:O2 saturations on room air are 84%.  She required 3 L of oxygen to maintain O2 saturations greater than 88 to 90%.  Review of Systems A 10 point review of systems was performed and it is as noted above otherwise negative.  Allergies  Allergen Reactions  . Mucinex D [Pseudoephedrine-Guaifenesin Er]     Rash on neck   Current Meds  Medication Sig  . albuterol (PROAIR HFA) 108 (90 Base) MCG/ACT inhaler Inhale 2 puffs into the lungs every 6 (six) hours as needed for wheezing or shortness of breath.  Marland Kitchen atorvastatin (LIPITOR) 20 MG tablet Take 20 mg by mouth daily.  Marland Kitchen azithromycin (ZITHROMAX) 250 MG tablet Take one pill every Monday, Wednesday and Friday.  . carvedilol (COREG) 6.25 MG tablet Take 6.25 mg by mouth 2 (two) times daily with a meal.  . Fluticasone-Salmeterol (ADVAIR DISKUS) 250-50 MCG/DOSE AEPB INHALE 1 PUFF BY  MOUTH INTO THE LUNGS TWICE DAILY  . ondansetron (ZOFRAN ODT) 4 MG disintegrating tablet Take 1 tablet (4 mg total) by mouth every 8 (eight) hours as needed.  . potassium chloride (MICRO-K) 10 MEQ CR capsule Take 10 mEq by mouth daily.  . Tiotropium Bromide Monohydrate (SPIRIVA RESPIMAT) 2.5 MCG/ACT AERS INHALE 2 PUFFS INTO THE LUNGS DAILY  . torsemide (DEMADEX) 20 MG tablet Take 20 mg by mouth daily.   Immunization History  Administered Date(s) Administered  . Fluad Quad(high Dose 65+) 10/08/2019  . Influenza Split 10/24/2013  . Influenza, High Dose Seasonal PF 09/24/2016, 10/20/2018, 10/20/2019  . Influenza,inj,Quad PF,6+ Mos 10/13/2014, 09/26/2015  . PFIZER SARS-COV-2 Vaccination 02/22/2020, 03/14/2020  . Pneumococcal-Unspecified 03/24/2004       Objective:   Physical Exam BP 110/62 (BP Location: Left Arm, Cuff Size: Normal)   Pulse 71   Temp (!) 97.5 F (36.4 C) (Temporal)   Ht 5\' 2"  (1.575 m)   Wt 115 lb 6.4 oz (52.3 kg)   SpO2 93%   BMI 21.11 kg/m  GENERAL: Frail-appearing woman, presents in wheelchair.  Comfortable on 3 L nasal cannula intermittent O2. HEAD: Normocephalic, atraumatic.  EYES: Pupils equal, round, reactive to light.  No scleral icterus.  MOUTH: Nose/mouth/throat not examined due to masking requirements for COVID 19. NECK: Supple. No thyromegaly. Trachea midline. No JVD.  No adenopathy. PULMONARY: Good air entry bilaterally.  Faint end expiratory wheezes, no rhonchi. CARDIOVASCULAR: S1 and S2. Regular rate and rhythm.  ABDOMEN:  Benign. MUSCULOSKELETAL: No joint deformity, no clubbing, no edema.  NEUROLOGIC: No focal deficit, speech is fluent. SKIN: Intact,warm,dry. PSYCH: Mood and behavior normal.       Assessment & Plan:     ICD-10-CM   1. COPD, very severe ([place])  J44.9    Patient recently had exacerbation Completed a azithromycin and prednisone Feels markedly better Continue Advair and Spiriva Consider switch to Trelegy   2. Purulent  exacerbation of chronic bronchitis  J42    Completed a azithromycin and prednisone taper   3. Chronic hypoxemic respiratory failure ([facility])  J96.11    Continue oxygen at 3 L/min   Discussion:  Patient is an 79 year old with severe COPD, patient of Dr. Patricia Pesa, had a recent exacerbation treated with a azithromycin and prednisone.  Patient is doing well since that time.  Recommend waiting at least 1 to 2 weeks to get flu vaccine and then also get Covid vaccine booster spacing out several weeks after the flu.  She is to see [place] in follow-up in 4 to 6 weeks time with either me or the nurse practitioner.   Renold Don, MD [facility]   *This note was dictated using voice recognition software/Dragon.  Despite best efforts to proofread, errors can occur which can change the meaning.  Any change was purely unintentional.

## 2020-11-08 DIAGNOSIS — J449 Chronic obstructive pulmonary disease, unspecified: Secondary | ICD-10-CM | POA: Diagnosis not present

## 2020-12-08 DIAGNOSIS — J449 Chronic obstructive pulmonary disease, unspecified: Secondary | ICD-10-CM | POA: Diagnosis not present

## 2020-12-14 ENCOUNTER — Other Ambulatory Visit: Payer: Self-pay

## 2020-12-14 ENCOUNTER — Emergency Department: Payer: PPO

## 2020-12-14 ENCOUNTER — Encounter: Payer: Self-pay | Admitting: Emergency Medicine

## 2020-12-14 ENCOUNTER — Inpatient Hospital Stay
Admission: EM | Admit: 2020-12-14 | Discharge: 2020-12-17 | DRG: 190 | Disposition: A | Discharge status: Home / Self Care | Payer: PPO | Attending: Internal Medicine | Admitting: Internal Medicine

## 2020-12-14 DIAGNOSIS — E1122 Type 2 diabetes mellitus with diabetic chronic kidney disease: Secondary | ICD-10-CM | POA: Diagnosis not present

## 2020-12-14 DIAGNOSIS — Z9981 Dependence on supplemental oxygen: Secondary | ICD-10-CM

## 2020-12-14 DIAGNOSIS — R0602 Shortness of breath: Secondary | ICD-10-CM | POA: Diagnosis not present

## 2020-12-14 DIAGNOSIS — Z888 Allergy status to other drugs, medicaments and biological substances status: Secondary | ICD-10-CM | POA: Diagnosis not present

## 2020-12-14 DIAGNOSIS — Z8249 Family history of ischemic heart disease and other diseases of the circulatory system: Secondary | ICD-10-CM | POA: Diagnosis not present

## 2020-12-14 DIAGNOSIS — N179 Acute kidney failure, unspecified: Secondary | ICD-10-CM | POA: Diagnosis not present

## 2020-12-14 DIAGNOSIS — Z87891 Personal history of nicotine dependence: Secondary | ICD-10-CM | POA: Diagnosis not present

## 2020-12-14 DIAGNOSIS — N1831 Chronic kidney disease, stage 3a: Secondary | ICD-10-CM | POA: Diagnosis not present

## 2020-12-14 DIAGNOSIS — E785 Hyperlipidemia, unspecified: Secondary | ICD-10-CM | POA: Diagnosis present

## 2020-12-14 DIAGNOSIS — J441 Chronic obstructive pulmonary disease with (acute) exacerbation: Secondary | ICD-10-CM | POA: Diagnosis not present

## 2020-12-14 DIAGNOSIS — I130 Hypertensive heart and chronic kidney disease with heart failure and stage 1 through stage 4 chronic kidney disease, or unspecified chronic kidney disease: Secondary | ICD-10-CM | POA: Diagnosis present

## 2020-12-14 DIAGNOSIS — Z23 Encounter for immunization: Secondary | ICD-10-CM

## 2020-12-14 DIAGNOSIS — Z803 Family history of malignant neoplasm of breast: Secondary | ICD-10-CM | POA: Diagnosis not present

## 2020-12-14 DIAGNOSIS — R0902 Hypoxemia: Secondary | ICD-10-CM

## 2020-12-14 DIAGNOSIS — E43 Unspecified severe protein-calorie malnutrition: Secondary | ICD-10-CM | POA: Diagnosis present

## 2020-12-14 DIAGNOSIS — J811 Chronic pulmonary edema: Secondary | ICD-10-CM | POA: Diagnosis not present

## 2020-12-14 DIAGNOSIS — Z6823 Body mass index (BMI) 23.0-23.9, adult: Secondary | ICD-10-CM | POA: Diagnosis not present

## 2020-12-14 DIAGNOSIS — E039 Hypothyroidism, unspecified: Secondary | ICD-10-CM | POA: Diagnosis not present

## 2020-12-14 DIAGNOSIS — Z20822 Contact with and (suspected) exposure to covid-19: Secondary | ICD-10-CM | POA: Diagnosis not present

## 2020-12-14 DIAGNOSIS — Z7951 Long term (current) use of inhaled steroids: Secondary | ICD-10-CM | POA: Diagnosis not present

## 2020-12-14 DIAGNOSIS — I509 Heart failure, unspecified: Secondary | ICD-10-CM

## 2020-12-14 DIAGNOSIS — F32A Depression, unspecified: Secondary | ICD-10-CM | POA: Diagnosis present

## 2020-12-14 DIAGNOSIS — R4702 Dysphasia: Secondary | ICD-10-CM | POA: Diagnosis not present

## 2020-12-14 DIAGNOSIS — Z8261 Family history of arthritis: Secondary | ICD-10-CM | POA: Diagnosis not present

## 2020-12-14 DIAGNOSIS — Z800 Family history of malignant neoplasm of digestive organs: Secondary | ICD-10-CM

## 2020-12-14 DIAGNOSIS — J9621 Acute and chronic respiratory failure with hypoxia: Secondary | ICD-10-CM | POA: Diagnosis not present

## 2020-12-14 DIAGNOSIS — I10 Essential (primary) hypertension: Secondary | ICD-10-CM | POA: Diagnosis not present

## 2020-12-14 DIAGNOSIS — R069 Unspecified abnormalities of breathing: Secondary | ICD-10-CM | POA: Diagnosis not present

## 2020-12-14 DIAGNOSIS — J90 Pleural effusion, not elsewhere classified: Secondary | ICD-10-CM | POA: Diagnosis not present

## 2020-12-14 DIAGNOSIS — I959 Hypotension, unspecified: Secondary | ICD-10-CM | POA: Diagnosis not present

## 2020-12-14 DIAGNOSIS — I5033 Acute on chronic diastolic (congestive) heart failure: Secondary | ICD-10-CM | POA: Diagnosis not present

## 2020-12-14 DIAGNOSIS — J189 Pneumonia, unspecified organism: Secondary | ICD-10-CM | POA: Diagnosis not present

## 2020-12-14 DIAGNOSIS — Z8049 Family history of malignant neoplasm of other genital organs: Secondary | ICD-10-CM | POA: Diagnosis not present

## 2020-12-14 DIAGNOSIS — I110 Hypertensive heart disease with heart failure: Secondary | ICD-10-CM | POA: Diagnosis not present

## 2020-12-14 DIAGNOSIS — Z79899 Other long term (current) drug therapy: Secondary | ICD-10-CM | POA: Diagnosis not present

## 2020-12-14 DIAGNOSIS — R0689 Other abnormalities of breathing: Secondary | ICD-10-CM | POA: Diagnosis not present

## 2020-12-14 LAB — URINALYSIS, COMPLETE (UACMP) WITH MICROSCOPIC
Bilirubin Urine: NEGATIVE
Glucose, UA: NEGATIVE mg/dL
Ketones, ur: NEGATIVE mg/dL
Leukocytes,Ua: NEGATIVE
Nitrite: NEGATIVE
Protein, ur: NEGATIVE mg/dL
Specific Gravity, Urine: 1.009 (ref 1.005–1.030)
Squamous Epithelial / HPF: NONE SEEN (ref 0–5)
pH: 5 (ref 5.0–8.0)

## 2020-12-14 LAB — LIPID PANEL
Cholesterol: 107 mg/dL (ref 0–200)
HDL: 40 mg/dL — ABNORMAL LOW (ref >40)
Total CHOL/HDL Ratio: 2.7 RATIO
Triglycerides: 48 mg/dL (ref <150)
VLDL: 10 mg/dL (ref 0–40)

## 2020-12-14 LAB — RESP PANEL BY RT-PCR (FLU A&B, COVID) ARPGX2
Influenza A by PCR: NEGATIVE
Influenza B by PCR: NEGATIVE
SARS Coronavirus 2 by RT PCR: NEGATIVE

## 2020-12-14 LAB — TROPONIN I (HIGH SENSITIVITY)
Troponin I (High Sensitivity): 38 ng/L — ABNORMAL HIGH (ref <18)
Troponin I (High Sensitivity): 55 ng/L — ABNORMAL HIGH (ref <18)
Troponin I (High Sensitivity): 46 ng/L — ABNORMAL HIGH (ref <18)

## 2020-12-14 LAB — COMPREHENSIVE METABOLIC PANEL
ALT: 9 U/L (ref 0–44)
AST: 17 U/L (ref 15–41)
Albumin: 3.3 g/dL — ABNORMAL LOW (ref 3.5–5.0)
Alkaline Phosphatase: 67 U/L (ref 38–126)
Anion gap: 8 (ref 5–15)
BUN: 16 mg/dL (ref 8–23)
CO2: 26 mmol/L (ref 22–32)
Calcium: 10 mg/dL (ref 8.9–10.3)
Chloride: 104 mmol/L (ref 98–111)
Creatinine, Ser: 1.09 mg/dL — ABNORMAL HIGH (ref 0.44–1.00)
GFR, Estimated: 51 mL/min — ABNORMAL LOW (ref >60)
Glucose, Bld: 156 mg/dL — ABNORMAL HIGH (ref 70–99)
Potassium: 4 mmol/L (ref 3.5–5.1)
Sodium: 138 mmol/L (ref 135–145)
Total Bilirubin: 0.7 mg/dL (ref 0.3–1.2)
Total Protein: 7.1 g/dL (ref 6.5–8.1)

## 2020-12-14 LAB — HEMOGLOBIN A1C
Hgb A1c MFr Bld: 5.3 % (ref 4.8–5.6)
Mean Plasma Glucose: 105.41 mg/dL

## 2020-12-14 LAB — CBC
HCT: 29.2 % — ABNORMAL LOW (ref 36.0–46.0)
Hemoglobin: 8.8 g/dL — ABNORMAL LOW (ref 12.0–15.0)
MCH: 26.4 pg (ref 26.0–34.0)
MCHC: 30.1 g/dL (ref 30.0–36.0)
MCV: 87.7 fL (ref 80.0–100.0)
Platelets: 219 10*3/uL (ref 150–400)
RBC: 3.33 MIL/uL — ABNORMAL LOW (ref 3.87–5.11)
RDW: 14.5 % (ref 11.5–15.5)
WBC: 11.9 10*3/uL — ABNORMAL HIGH (ref 4.0–10.5)
nRBC: 0 % (ref 0.0–0.2)

## 2020-12-14 LAB — TSH: TSH: 0.785 u[IU]/mL (ref 0.350–4.500)

## 2020-12-14 LAB — CBG MONITORING, ED
Glucose-Capillary: 181 mg/dL — ABNORMAL HIGH (ref 70–99)
Glucose-Capillary: 171 mg/dL — ABNORMAL HIGH (ref 70–99)
Glucose-Capillary: 158 mg/dL — ABNORMAL HIGH (ref 70–99)

## 2020-12-14 LAB — BRAIN NATRIURETIC PEPTIDE: B Natriuretic Peptide: 376.5 pg/mL — ABNORMAL HIGH (ref 0.0–100.0)

## 2020-12-14 MED ORDER — ENOXAPARIN SODIUM 60 MG/0.6ML ~~LOC~~ SOLN
1.0000 mg/kg | Freq: Two times a day (BID) | SUBCUTANEOUS | Status: DC
  Administered 2020-12-14: 16:00:00 47.5 mg via SUBCUTANEOUS
  Filled 2020-12-14 (×2): qty 0.6

## 2020-12-14 MED ORDER — ONDANSETRON HCL 4 MG/2ML IJ SOLN: 4.0000 mg | Freq: Four times a day (QID) | INTRAMUSCULAR | Status: DC | PRN

## 2020-12-14 MED ORDER — TORSEMIDE 20 MG PO TABS
20.0000 mg | ORAL_TABLET | Freq: Every day | ORAL | Status: DC
  Filled 2020-12-14: qty 1

## 2020-12-14 MED ORDER — FUROSEMIDE 10 MG/ML IJ SOLN
60.0000 mg | Freq: Once | INTRAMUSCULAR | Status: AC
  Administered 2020-12-14: 15:00:00 60 mg via INTRAVENOUS
  Filled 2020-12-14: qty 8

## 2020-12-14 MED ORDER — LISINOPRIL 5 MG PO TABS
5.0000 mg | ORAL_TABLET | Freq: Every day | ORAL | Status: DC
  Administered 2020-12-14 – 2020-12-17 (×4): 5 mg via ORAL
  Filled 2020-12-14 (×4): qty 1

## 2020-12-14 MED ORDER — ATORVASTATIN CALCIUM 20 MG PO TABS
20.0000 mg | ORAL_TABLET | Freq: Every day | ORAL | Status: DC
  Administered 2020-12-14 – 2020-12-17 (×4): 20 mg via ORAL
  Filled 2020-12-14 (×2): qty 2
  Filled 2020-12-14 (×2): qty 1

## 2020-12-14 MED ORDER — IPRATROPIUM-ALBUTEROL 0.5-2.5 (3) MG/3ML IN SOLN
3.0000 mL | Freq: Once | RESPIRATORY_TRACT | Status: AC
  Administered 2020-12-14: 12:00:00 3 mL via RESPIRATORY_TRACT

## 2020-12-14 MED ORDER — ALBUTEROL SULFATE (2.5 MG/3ML) 0.083% IN NEBU: 2.5000 mg | INHALATION_SOLUTION | Freq: Four times a day (QID) | RESPIRATORY_TRACT | Status: DC | PRN

## 2020-12-14 MED ORDER — METHYLPREDNISOLONE SODIUM SUCC 125 MG IJ SOLR
60.0000 mg | Freq: Four times a day (QID) | INTRAMUSCULAR | Status: DC
  Administered 2020-12-14 – 2020-12-15 (×3): 60 mg via INTRAVENOUS
  Filled 2020-12-14 (×3): qty 2

## 2020-12-14 MED ORDER — INSULIN ASPART 100 UNIT/ML ~~LOC~~ SOLN: 0.0000 [IU] | Freq: Every day | SUBCUTANEOUS | Status: DC

## 2020-12-14 MED ORDER — TRAMADOL HCL 50 MG PO TABS
50.0000 mg | ORAL_TABLET | Freq: Four times a day (QID) | ORAL | Status: DC | PRN
  Filled 2020-12-14: qty 1

## 2020-12-14 MED ORDER — SODIUM CHLORIDE 0.9 % IV SOLN
250.0000 mL | INTRAVENOUS | Status: DC | PRN
Start: 2020-12-14 — End: 2020-12-17
  Administered 2020-12-15: 22:00:00 250 mL via INTRAVENOUS

## 2020-12-14 MED ORDER — INSULIN ASPART 100 UNIT/ML ~~LOC~~ SOLN
0.0000 [IU] | Freq: Three times a day (TID) | SUBCUTANEOUS | Status: DC
  Administered 2020-12-14 – 2020-12-16 (×4): 3 [IU] via SUBCUTANEOUS
  Administered 2020-12-16: 13:00:00 5 [IU] via SUBCUTANEOUS
  Administered 2020-12-16 – 2020-12-17 (×2): 3 [IU] via SUBCUTANEOUS
  Administered 2020-12-17: 09:00:00 5 [IU] via SUBCUTANEOUS
  Filled 2020-12-14 (×8): qty 1

## 2020-12-14 MED ORDER — IPRATROPIUM-ALBUTEROL 0.5-2.5 (3) MG/3ML IN SOLN
3.0000 mL | Freq: Once | RESPIRATORY_TRACT | Status: AC
  Administered 2020-12-14: 3 mL via RESPIRATORY_TRACT
  Filled 2020-12-14: qty 6

## 2020-12-14 MED ORDER — ACETAMINOPHEN 325 MG PO TABS
650.0000 mg | ORAL_TABLET | ORAL | Status: DC | PRN
  Administered 2020-12-16: 650 mg via ORAL
  Filled 2020-12-14: qty 2

## 2020-12-14 MED ORDER — CARVEDILOL 6.25 MG PO TABS
6.2500 mg | ORAL_TABLET | Freq: Two times a day (BID) | ORAL | Status: DC
  Administered 2020-12-15 – 2020-12-17 (×5): 6.25 mg via ORAL
  Filled 2020-12-14 (×6): qty 1

## 2020-12-14 MED ORDER — POTASSIUM CHLORIDE CRYS ER 10 MEQ PO TBCR
10.0000 meq | EXTENDED_RELEASE_TABLET | Freq: Every day | ORAL | Status: DC
  Administered 2020-12-14 – 2020-12-17 (×4): 10 meq via ORAL
  Filled 2020-12-14 (×4): qty 1

## 2020-12-14 MED ORDER — MOMETASONE FURO-FORMOTEROL FUM 200-5 MCG/ACT IN AERO
2.0000 | INHALATION_SPRAY | Freq: Two times a day (BID) | RESPIRATORY_TRACT | Status: DC
  Administered 2020-12-14: 21:00:00 2 via RESPIRATORY_TRACT
  Filled 2020-12-14: qty 8.8

## 2020-12-14 MED ORDER — ASPIRIN EC 325 MG PO TBEC
325.0000 mg | DELAYED_RELEASE_TABLET | Freq: Once | ORAL | Status: AC
  Administered 2020-12-14: 16:00:00 325 mg via ORAL
  Filled 2020-12-14: qty 1

## 2020-12-14 MED ORDER — SODIUM CHLORIDE 0.9% FLUSH: 3.0000 mL | INTRAVENOUS | Status: DC | PRN

## 2020-12-14 MED ORDER — SODIUM CHLORIDE 0.9% FLUSH
3.0000 mL | Freq: Two times a day (BID) | INTRAVENOUS | Status: DC
  Administered 2020-12-14 – 2020-12-17 (×6): 3 mL via INTRAVENOUS

## 2020-12-14 MED ORDER — MORPHINE SULFATE (PF) 2 MG/ML IV SOLN
1.0000 mg | INTRAVENOUS | Status: DC | PRN
  Administered 2020-12-14: 1 mg via INTRAVENOUS
  Filled 2020-12-14: qty 1

## 2020-12-14 NOTE — H&P (Signed)
History and Physical   [facility] [place] [facility] Admission History and Physical [employer], D.O.    Patient Name: [name] MR#: [id number] Date of Birth: 1941-08-08 Date of Admission: 12/14/2020  Referring MD/NP/PA: Dr. Kerman Passey Primary Care Physician: [name], MD  Chief Complaint:  Chief Complaint  Patient presents with  . Shortness of Breath    HPI: [name] is a 79 y.o. 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  Patient was in a usual state of health until today when she noticed worsening SOB, increased wheezing and an increase in her O2 requirement.  She rec'd SoluMedrol, Duoneb and was placed on a NRB prior to arrival.  .  Patient denies fevers/chills, weakness, dizziness, chest pain,  N/V/C/D, abdominal pain, dysuria/frequency, changes in mental status.    Otherwise there has been no change in status. Patient has been taking medication as prescribed and there has been no recent change in medication or diet.  No recent antibiotics.  There has been no recent illness, hospitalizations, travel or sick contacts.    EMS/ED Course: Patient received Lasix, Duoneb. Medical admission has been requested for further management of new onset CHF, elevated trop rule out NSTEMI, COPD exac. Marland Kitchen  Review of Systems:  CONSTITUTIONAL: No fever/chills, fatigue, weakness, weight gain/loss, headache. EYES: No blurry or double vision. ENT: No tinnitus, postnasal drip, redness or soreness of the oropharynx. RESPIRATORY: No cough, dyspnea, wheeze.  No hemoptysis.  CARDIOVASCULAR: No chest pain, palpitations, syncope, orthopnea. No lower extremity edema.  GASTROINTESTINAL: No nausea, vomiting, abdominal pain, diarrhea, constipation.  No hematemesis, melena or hematochezia. GENITOURINARY: No dysuria, frequency,  hematuria. ENDOCRINE: No polyuria or nocturia. No heat or cold intolerance. HEMATOLOGY: No anemia, bruising, bleeding. INTEGUMENTARY: No rashes, ulcers, lesions. MUSCULOSKELETAL: No arthritis, gout, dyspnea. NEUROLOGIC: No numbness, tingling, ataxia, seizure-type activity, weakness. PSYCHIATRIC: No anxiety, depression, insomnia.   Past Medical History:  Diagnosis Date  . COPD (chronic obstructive pulmonary disease) ([place])   . Environmental allergies   . High blood pressure   . High cholesterol     Past Surgical History:  Procedure Laterality Date  . BREAST LUMPECTOMY Bilateral 1962 (L) 2002(R)  . VESICOVAGINAL FISTULA CLOSURE W/ TAH  1975     reports that she quit smoking about 7 years ago. Her smoking use included cigarettes. She has a 45.00 pack-year smoking history. She has never used smokeless tobacco. She reports current alcohol use. She reports that she does not use drugs.  Allergies  Allergen Reactions  . Mucinex D [Pseudoephedrine-Guaifenesin Er]     Rash on neck    Family History  Problem Relation Age of Onset  . Heart disease Father   . Rheum arthritis Mother   . Cancer Mother        breast  . Heart disease Brother        2 brothers  . Cancer Maternal Aunt        cervical  . Cancer Maternal Aunt        stomach    Prior to Admission medications   Medication Sig Start Date End Date Taking? Authorizing Provider  albuterol (PROAIR HFA) 108 (90 Base) MCG/ACT inhaler Inhale 2 puffs into the lungs every 6 (six) hours as needed for wheezing or shortness of breath. 07/22/19   Wilhelmina Mcardle, MD  atorvastatin (LIPITOR) 20 MG tablet Take 20 mg by mouth  daily.    [provider]  carvedilol (COREG) 6.25 MG tablet Take 6.25 mg by mouth 2 (two) times daily with a meal.    [provider]  Fluticasone-Salmeterol (ADVAIR DISKUS) 250-50 MCG/DOSE AEPB INHALE 1 PUFF BY MOUTH INTO THE LUNGS TWICE DAILY 08/25/20   Flora Lipps, MD  ondansetron (ZOFRAN ODT) 4  MG disintegrating tablet Take 1 tablet (4 mg total) by mouth every 8 (eight) hours as needed. 04/04/19   Alfred Levins, [place], MD  potassium chloride (MICRO-K) 10 MEQ CR capsule Take 10 mEq by mouth daily.    [provider]  Tiotropium Bromide Monohydrate (SPIRIVA RESPIMAT) 2.5 MCG/ACT AERS INHALE 2 PUFFS INTO THE LUNGS DAILY 03/22/20   Flora Lipps, MD  torsemide (DEMADEX) 20 MG tablet Take 20 mg by mouth daily.    [provider]    Physical Exam: Vitals:   12/14/20 1315 12/14/20 1330 12/14/20 1415 12/14/20 1430  BP:  133/76  124/63  Pulse: 91 93 90 87  Resp: (!) 22 (!) 21 17 17   Temp:      TempSrc:      SpO2: 100% 100% 100% 99%  Weight:      Height:        GENERAL: 79 y.o.-year-old white female patient, well-developed, well-nourished lying in the bed in no acute distress.  Pleasant and cooperative.   HEENT: Head atraumatic, normocephalic. Pupils equal. Mucus membranes moist. NECK: Supple. No JVD. CHEST: Mild diffuse wheezing.  No use of accessory muscles of respiration.  No reproducible chest wall tenderness.  CARDIOVASCULAR: S1, S2 normal. No murmurs, rubs, or gallops. Cap refill <2 seconds. Pulses intact distally.  ABDOMEN: Soft, nondistended, nontender. No rebound, guarding, rigidity. Normoactive bowel sounds present in all four quadrants.  EXTREMITIES: No pedal edema, cyanosis, or clubbing. No calf tenderness or Homan's sign.  NEUROLOGIC: The patient is alert and oriented x 3. Cranial nerves II through XII are grossly intact with no focal sensorimotor deficit. PSYCHIATRIC:  Normal affect, mood, thought content. SKIN: Warm, dry, and intact without obvious rash, lesion, or ulcer.    Labs on Admission:  CBC: Recent Labs  Lab 12/14/20 1107  WBC 11.9*  HGB 8.8*  HCT 29.2*  MCV 87.7  PLT 194   Basic Metabolic Panel: Recent Labs  Lab 12/14/20 1107  NA 138  K 4.0  CL 104  CO2 26  GLUCOSE 156*  BUN 16  CREATININE 1.09*  CALCIUM 10.0    GFR: Estimated Creatinine Clearance: 30.7 mL/min (A) (by C-G formula based on SCr of 1.09 mg/dL (H)). Liver Function Tests: Recent Labs  Lab 12/14/20 1107  AST 17  ALT 9  ALKPHOS 67  BILITOT 0.7  PROT 7.1  ALBUMIN 3.3*   No results for input(s): LIPASE, AMYLASE in the last 168 hours. No results for input(s): AMMONIA in the last 168 hours. Coagulation Profile: No results for input(s): INR, PROTIME in the last 168 hours. Cardiac Enzymes: No results for input(s): CKTOTAL, CKMB, CKMBINDEX, TROPONINI in the last 168 hours. BNP (last 3 results) No results for input(s): PROBNP in the last 8760 hours. HbA1C: No results for input(s): HGBA1C in the last 72 hours. CBG: No results for input(s): GLUCAP in the last 168 hours. Lipid Profile: No results for input(s): CHOL, HDL, LDLCALC, TRIG, CHOLHDL, LDLDIRECT in the last 72 hours. Thyroid Function Tests: No results for input(s): TSH, T4TOTAL, FREET4, T3FREE, THYROIDAB in the last 72 hours. Anemia Panel: No results for input(s): VITAMINB12, FOLATE, FERRITIN, TIBC, IRON, RETICCTPCT in the  last 72 hours. Urine analysis:    Component Value Date/Time   COLORURINE STRAW (A) 10/19/2017 1939   APPEARANCEUR CLEAR (A) 10/19/2017 1939   APPEARANCEUR CLEAR 07/02/2013 1144   LABSPEC 1.008 10/19/2017 1939   LABSPEC 1.015 07/02/2013 1144   PHURINE 5.0 10/19/2017 1939   GLUCOSEU NEGATIVE 10/19/2017 1939   GLUCOSEU NEGATIVE 07/02/2013 [place] 10/19/2017 1939   BILIRUBINUR NEGATIVE 10/19/2017 1939   BILIRUBINUR NEGATIVE 07/02/2013 [place] 10/19/2017 1939   PROTEINUR NEGATIVE 10/19/2017 1939   NITRITE NEGATIVE 10/19/2017 1939   LEUKOCYTESUR MODERATE (A) 10/19/2017 1939   LEUKOCYTESUR NEGATIVE 07/02/2013 1144   Sepsis Labs: @LABRCNTIP (procalcitonin:4,lacticidven:4) ) Recent Results (from the past 240 hour(s))  Resp Panel by RT-PCR (Flu A&B, Covid) Nasopharyngeal Swab     Status: None   Collection Time:  12/14/20 11:07 AM   Specimen: Nasopharyngeal Swab; Nasopharyngeal(NP) swabs in vial transport medium  Result Value Ref Range Status   SARS Coronavirus 2 by RT PCR NEGATIVE NEGATIVE Final    Comment: (NOTE) SARS-CoV-2 target nucleic acids are NOT DETECTED.  The SARS-CoV-2 RNA is generally detectable in upper respiratory specimens during the acute phase of infection. The lowest concentration of SARS-CoV-2 viral copies this assay can detect is 138 copies/mL. A negative result does not preclude SARS-Cov-2 infection and should not be used as the sole basis for treatment or other patient management decisions. A negative result may occur with  improper specimen collection/handling, submission of specimen other than nasopharyngeal swab, presence of viral mutation(s) within the areas targeted by this assay, and inadequate number of viral copies(<138 copies/mL). A negative result must be combined with clinical observations, patient history, and epidemiological information. The expected result is Negative.  Fact Sheet for Patients:  EntrepreneurPulse.com.au  Fact Sheet for Healthcare Providers:  IncredibleEmployment.be  This test is no t yet approved or cleared by the [place] FDA and  has been authorized for detection and/or diagnosis of SARS-CoV-2 by FDA under an Emergency Use Authorization (EUA). This EUA will remain  in effect (meaning this test can be used) for the duration of the COVID-19 declaration under Section 564(b)(1) of the Act, 21 U.S.C.section 360bbb-3(b)(1), unless the authorization is terminated  or revoked sooner.       Influenza A by PCR NEGATIVE NEGATIVE Final   Influenza B by PCR NEGATIVE NEGATIVE Final    Comment: (NOTE) The Xpert Xpress SARS-CoV-2/FLU/RSV plus assay is intended as an aid in the diagnosis of influenza from Nasopharyngeal swab specimens and should not be used as a sole basis for treatment. Nasal washings  and aspirates are unacceptable for Xpert Xpress SARS-CoV-2/FLU/RSV testing.  Fact Sheet for Patients: EntrepreneurPulse.com.au  Fact Sheet for Healthcare Providers: IncredibleEmployment.be  This test is not yet approved or cleared by the [place] FDA and has been authorized for detection and/or diagnosis of SARS-CoV-2 by FDA under an Emergency Use Authorization (EUA). This EUA will remain in effect (meaning this test can be used) for the duration of the COVID-19 declaration under Section 564(b)(1) of the Act, 21 U.S.C. section 360bbb-3(b)(1), unless the authorization is terminated or revoked.  Performed at [facility], [address], [place], [place] [address]      Radiological Exams on Admission: DG Chest Portable 1 View  Result Date: 12/14/2020 CLINICAL DATA:  Shortness of breath EXAM: PORTABLE CHEST 1 VIEW COMPARISON:  October 17, 2018 FINDINGS: There is a right pleural effusion. There is interstitial edema with patchy airspace opacity in the right base.  Heart is upper normal in size with apparent mild pulmonary venous hypertension. There is aortic atherosclerosis. No adenopathy. No bone lesions IMPRESSION: Suspect mild pulmonary vascular congestion. Right pleural effusion with interstitial edema raises question of a degree of underlying congestive heart failure. Ill-defined opacity right base likely focus of pneumonia, although edema in this area could present similarly. Aortic Atherosclerosis (ICD10-I70.0). Electronically Signed   By: Lowella Grip III M.D.   On: 12/14/2020 11:33    EKG: Normal sinus rhythm at 90 bpm with normal axis and nonspecific ST-T wave changes.   Assessment/Plan  This is a 79 y.o. female with a history of home O2-dependent COPD, HTN, HLD, DM2, CKD3 now being admitted with:  #. New onset CHF, elevated trop rule out NSTEMI - Admit to IP with telemetry monitoring. - Trend troponins, check lipids  and TSH. - Beta blocker, ACE, aspirin and statin ordered.   - IV Lasix in ER, continue Demadex, KCl - I/O, daily weights - Check echo - Cardiology consultation has been requested.  #. Acute exacerbation of COPD, home O2-dependent usually on 4L - IV steroids - Continue Advair - Nebulizers, O2 therapy and expectorants as needed.  - Continuous pulse oximetry - Consider pulmonary consult if not improving.   #. History of HLD - Continue Lipitor  #. History of CKD - Monitor  #. H/o Diabetes - Accuchecks achs with RISS coverage - Heart healthy, carb controlled diet  #. History of hypothyroidism - Check TSH  Admission status: IP, tele IV Fluids: HL Diet/Nutrition: Heart healthy carb controlled Consults called: Cardiology  DVT Px: Lovenox, SCDs and early ambulation. Code Status: Full Code  Disposition Plan: To home in 1-2 days  All the records are reviewed and case discussed with ED provider. Management plans discussed with the patient and/or family who express understanding and agree with plan of care.  Parris Signer D.O. on 12/14/2020 at 2:50 PM CC: Primary care physician; [name], MD   12/14/2020, 2:50 PM

## 2020-12-14 NOTE — ED Notes (Signed)
Pt transferred to hospital bed. No complaints at this time

## 2020-12-14 NOTE — ED Provider Notes (Signed)
[facility] Emergency Department Provider Note  Time seen: 11:14 AM  I have reviewed the triage vital signs and the nursing notes.   HISTORY  Chief Complaint Shortness of Breath   HPI [name] is a 79 y.o. female with a past medical history of COPD, hypertension, hyperlipidemia presents to the emergency department for shortness of breath.   According to report patient presents from home for worsening shortness of breath was found to be satting 75% on 4 L which is the patient's typical O2 requirement.  EMS states wheezing auscultated given 125 mg of Solu-Medrol 1 DuoNeb and 1 albuterol nebulizer treatment prior to arrival.  Upon arrival patient is satting 100% on a nonrebreather mask.  Patient denies any chest pain, does state shortness of breath but denies any cough.  No fever.  States generalized body aches which is typical for her per patient.  Past Medical History:  Diagnosis Date  . COPD (chronic obstructive pulmonary disease) ([place])   . Environmental allergies   . High blood pressure   . High cholesterol     Patient Active Problem List   Diagnosis Date Noted  . Pain due to onychomycosis of toenails of both feet 06/06/2020  . Onychomycosis 09/20/2016  . Health care maintenance 06/19/2016  . Back pain 05/12/2015  . Fatigue 10/13/2014  . Major depression in remission ([place]) 10/10/2014  . Type 2 diabetes mellitus with stage 3 chronic kidney disease ([place]) 06/08/2014  . Edema 05/29/2014  . HTN (hypertension), benign 05/29/2014  . Hyperlipidemia associated with type 2 diabetes mellitus ([place]) 05/29/2014  . UI (urinary incontinence) 05/29/2014  . Dyspnea 04/26/2014  . COPD (chronic obstructive pulmonary disease) ([place]) 03/25/2014  . Chronic respiratory failure ([place]) 03/25/2014    Past Surgical History:  Procedure Laterality Date  . BREAST LUMPECTOMY Bilateral 1962 (L) 2002(R)  . VESICOVAGINAL FISTULA CLOSURE W/ TAH  1975    Prior to Admission  medications   Medication Sig Start Date End Date Taking? Authorizing Provider  albuterol (PROAIR HFA) 108 (90 Base) MCG/ACT inhaler Inhale 2 puffs into the lungs every 6 (six) hours as needed for wheezing or shortness of breath. 07/22/19   Wilhelmina Mcardle, MD  atorvastatin (LIPITOR) 20 MG tablet Take 20 mg by mouth daily.    [provider]  carvedilol (COREG) 6.25 MG tablet Take 6.25 mg by mouth 2 (two) times daily with a meal.    [provider]  Fluticasone-Salmeterol (ADVAIR DISKUS) 250-50 MCG/DOSE AEPB INHALE 1 PUFF BY MOUTH INTO THE LUNGS TWICE DAILY 08/25/20   Flora Lipps, MD  ondansetron (ZOFRAN ODT) 4 MG disintegrating tablet Take 1 tablet (4 mg total) by mouth every 8 (eight) hours as needed. 04/04/19   Alfred Levins, [place], MD  potassium chloride (MICRO-K) 10 MEQ CR capsule Take 10 mEq by mouth daily.    [provider]  Tiotropium Bromide Monohydrate (SPIRIVA RESPIMAT) 2.5 MCG/ACT AERS INHALE 2 PUFFS INTO THE LUNGS DAILY 03/22/20   Flora Lipps, MD  torsemide (DEMADEX) 20 MG tablet Take 20 mg by mouth daily.    [provider]    Allergies  Allergen Reactions  . Mucinex D [Pseudoephedrine-Guaifenesin Er]     Rash on neck    Family History  Problem Relation Age of Onset  . Heart disease Father   . Rheum arthritis Mother   . Cancer Mother        breast  . Heart disease Brother        2 brothers  .  Cancer Maternal Aunt        cervical  . Cancer Maternal Aunt        stomach    Social History Social History   Tobacco Use  . Smoking status: Former Smoker    Packs/day: 1.00    Years: 45.00    Pack years: 45.00    Types: Cigarettes    Quit date: 07/03/2013    Years since quitting: 7.4  . Smokeless tobacco: Never Used  Substance Use Topics  . Alcohol use: Yes    Alcohol/week: 0.0 standard drinks    Comment: occasional glass of wine  . Drug use: No    Review of Systems Constitutional: Negative for fever. Cardiovascular: Negative for  chest pain. Respiratory: Positive for shortness of breath.  Negative for cough. Gastrointestinal: Negative for abdominal pain, vomiting  Musculoskeletal: Negative for musculoskeletal complaints Neurological: Negative for headache All other ROS negative  ____________________________________________   PHYSICAL EXAM:  VITAL SIGNS: ED Triage Vitals  Enc Vitals Group     BP 12/14/20 1058 (!) 143/68     Pulse Rate 12/14/20 1058 91     Resp 12/14/20 1058 (!) 27     Temp 12/14/20 1058 99.3 F (37.4 C)     Temp Source 12/14/20 1058 Oral     SpO2 12/14/20 1058 100 %     Weight 12/14/20 1104 106 lb (48.1 kg)     Height 12/14/20 1104 5\' 2"  (1.575 m)     Head Circumference --      Peak Flow --      Pain Score 12/14/20 1104 0     Pain Loc --      Pain Edu? --      Excl. in [place]? --    Constitutional: Alert and oriented. Well appearing and in no distress. Eyes: Normal exam ENT      Head: Normocephalic and atraumatic.      Mouth/Throat: Mucous membranes are moist. Cardiovascular: Normal rate, regular rhythm.  Respiratory: Mild to moderate tachypnea with diminished breath sounds bilaterally but no obvious wheeze rales or rhonchi. Gastrointestinal: Soft and nontender. No distention.  Musculoskeletal: Nontender with normal range of motion in all extremities. No lower extremity edema.  Nontender range of motion. Neurologic:  Normal speech and language. No gross focal neurologic deficits Skin:  Skin is warm, dry and intact.  Psychiatric: Mood and affect are normal.   ____________________________________________    EKG  EKG viewed and interpreted by myself shows a normal sinus rhythm at 90 bpm with a narrow QRS, normal axis, normal intervals, no concerning ST changes.  ____________________________________________    RADIOLOGY  Chest x-ray shows mild vascular congestion.  ____________________________________________   INITIAL IMPRESSION / ASSESSMENT AND PLAN / ED  COURSE  Pertinent labs & imaging results that were available during my care of the patient were reviewed by me and considered in my medical decision making (see chart for details).   Patient presents to the emergency department with shortness of breath.  History of COPD.  Found to have borderline low-grade temperature 99.3 along with moderate tachypnea and diminished breath sounds bilaterally.  Patient has received 125 mg of Solu-Medrol by EM as well as a DuoNeb and albuterol nebulizer.  We will dose 2 additional duo nebs, obtain labs, chest x-ray, Covid swab and continue to closely monitor.  We will attempt to titrate down oxygen as allowed.  Chest x-ray shows vascular congestion with opacity edema versus pneumonia.  No symptoms of pneumonia at this time,  no reported fever.  Patient's BNP is elevated 376, troponin is elevated at 55.  We will dose IV Lasix.  We were able to transition off the nonrebreather back to nasal cannula at 6 L however with minimal exertion such as using the restroom patient desatted once again.  Patient is able to maintain sats in the upper 90s on 6 L while at rest in bed.  Patient will be admitted to the hospital service for further work-up and treatment.  [name] was evaluated in Emergency Department on 12/14/2020 for the symptoms described in the history of present illness. She was evaluated in the context of the global COVID-19 pandemic, which necessitated consideration that the patient might be at risk for infection with the SARS-CoV-2 virus that causes COVID-19. Institutional protocols and algorithms that pertain to the evaluation of patients at risk for COVID-19 are in a state of rapid change based on information released by regulatory bodies including the CDC and federal and state organizations. These policies and algorithms were followed during the patient's care in the ED.  ____________________________________________   FINAL CLINICAL IMPRESSION(S) / ED  DIAGNOSES  Dyspnea COPD exacerbation CHF   Harvest Dark, MD 12/14/20 1447

## 2020-12-14 NOTE — Consult Note (Signed)
ANTICOAGULATION CONSULT NOTE - Consult  Pharmacy Consult for Lovenox Indication: chest pain/ACS (NSTEMI)  Allergies  Allergen Reactions  . Mucinex D [Pseudoephedrine-Guaifenesin Er]     Rash on neck    Patient Measurements: Height: 5\' 2"  (157.5 cm) Weight: 48.1 kg (106 lb) IBW/kg (Calculated) : 50.1 Heparin Dosing Weight: 48.1kg  Vital Signs: Temp: 99.3 F (37.4 C) (12/15 1058) Temp Source: Oral (12/15 1058) BP: 124/63 (12/15 1430) Pulse Rate: 87 (12/15 1430)  Labs: Recent Labs    12/14/20 1107 12/14/20 1341  HGB 8.8*  --   HCT 29.2*  --   PLT 219  --   CREATININE 1.09*  --   TROPONINIHS 38* 55*    Estimated Creatinine Clearance: 30.7 mL/min (A) (by C-G formula based on SCr of 1.09 mg/dL (H)).   Medications:  No AC pertinent drug allergies Inpatient - Aspirin 325mg  x1. Outpatient - no relevant interactions.  Assessment: CC: SOB, CP --> NSTEMI PMH: COPD, HTN, HLD.  Heparin Dosing Weight: 48.1kg; PLTs 219; H/H 8.8/29.2   Goal of Therapy:  Anti-Xa level 0.6-1 units/ml 4hrs after LMWH dose given  Monitor platelets by anticoagulation protocol: Yes   Plan:  Lovenox 1mg /kg q12h; will monitor renal function and adjust by anti-Xa levels.  Shanon Brow Talen Poser 12/14/2020,3:12 PM

## 2020-12-14 NOTE — ED Triage Notes (Signed)
Pt comes into the ED via [facility] from home c/o [facility] that has been ongoing since 9:00.  75% on 4 L, non-rebreather placed on the patient.  Lung sounds diminished in all fields. Duoneb, 125 solumedrol, and albuterol given in route.  H/O COPD.  125/70, RR 20, 98.3, CBG 213.

## 2020-12-15 ENCOUNTER — Other Ambulatory Visit: Payer: Self-pay

## 2020-12-15 ENCOUNTER — Encounter: Payer: Self-pay | Admitting: Family Medicine

## 2020-12-15 ENCOUNTER — Inpatient Hospital Stay
Admit: 2020-12-15 | Discharge: 2020-12-15 | Disposition: A | Discharge status: Home / Self Care | Payer: PPO | Attending: Family Medicine | Admitting: Family Medicine

## 2020-12-15 DIAGNOSIS — J441 Chronic obstructive pulmonary disease with (acute) exacerbation: Principal | ICD-10-CM

## 2020-12-15 DIAGNOSIS — I509 Heart failure, unspecified: Secondary | ICD-10-CM

## 2020-12-15 LAB — BASIC METABOLIC PANEL
Anion gap: 10 (ref 5–15)
BUN: 18 mg/dL (ref 8–23)
CO2: 29 mmol/L (ref 22–32)
Calcium: 9.5 mg/dL (ref 8.9–10.3)
Chloride: 102 mmol/L (ref 98–111)
Creatinine, Ser: 1.22 mg/dL — ABNORMAL HIGH (ref 0.44–1.00)
GFR, Estimated: 45 mL/min — ABNORMAL LOW (ref >60)
Glucose, Bld: 162 mg/dL — ABNORMAL HIGH (ref 70–99)
Potassium: 4.1 mmol/L (ref 3.5–5.1)
Sodium: 141 mmol/L (ref 135–145)

## 2020-12-15 LAB — ECHOCARDIOGRAM COMPLETE
AR max vel: 2.07 cm2
AV Area VTI: 2.04 cm2
AV Area mean vel: 1.99 cm2
AV Mean grad: 4 mmHg
AV Peak grad: 6.8 mmHg
Ao pk vel: 1.3 m/s
Area-P 1/2: 4.31 cm2
Height: 62 in
S' Lateral: 2.83 cm
Weight: 2036.8 oz

## 2020-12-15 LAB — GLUCOSE, CAPILLARY
Glucose-Capillary: 163 mg/dL — ABNORMAL HIGH (ref 70–99)
Glucose-Capillary: 166 mg/dL — ABNORMAL HIGH (ref 70–99)
Glucose-Capillary: 149 mg/dL — ABNORMAL HIGH (ref 70–99)

## 2020-12-15 LAB — TROPONIN I (HIGH SENSITIVITY)
Troponin I (High Sensitivity): 34 ng/L — ABNORMAL HIGH (ref <18)
Troponin I (High Sensitivity): 29 ng/L — ABNORMAL HIGH (ref <18)

## 2020-12-15 LAB — CBG MONITORING, ED: Glucose-Capillary: 145 mg/dL — ABNORMAL HIGH (ref 70–99)

## 2020-12-15 MED ORDER — ARFORMOTEROL TARTRATE 15 MCG/2ML IN NEBU
15.0000 ug | INHALATION_SOLUTION | Freq: Two times a day (BID) | RESPIRATORY_TRACT | Status: DC
  Administered 2020-12-16 – 2020-12-17 (×3): 15 ug via RESPIRATORY_TRACT
  Filled 2020-12-15 (×6): qty 2

## 2020-12-15 MED ORDER — METHYLPREDNISOLONE SODIUM SUCC 40 MG IJ SOLR
40.0000 mg | Freq: Three times a day (TID) | INTRAMUSCULAR | Status: DC
  Administered 2020-12-15 – 2020-12-17 (×6): 40 mg via INTRAVENOUS
  Filled 2020-12-15 (×6): qty 1

## 2020-12-15 MED ORDER — SODIUM CHLORIDE 0.9 % IV SOLN
500.0000 mg | INTRAVENOUS | Status: AC
  Administered 2020-12-15 – 2020-12-16 (×2): 500 mg via INTRAVENOUS
  Filled 2020-12-15 (×2): qty 500

## 2020-12-15 MED ORDER — BUDESONIDE 0.25 MG/2ML IN SUSP
0.2500 mg | Freq: Two times a day (BID) | RESPIRATORY_TRACT | Status: DC
  Administered 2020-12-15 – 2020-12-17 (×4): 0.25 mg via RESPIRATORY_TRACT
  Filled 2020-12-15 (×4): qty 2

## 2020-12-15 MED ORDER — IPRATROPIUM-ALBUTEROL 0.5-2.5 (3) MG/3ML IN SOLN
3.0000 mL | Freq: Four times a day (QID) | RESPIRATORY_TRACT | Status: DC
  Administered 2020-12-15 – 2020-12-16 (×6): 3 mL via RESPIRATORY_TRACT
  Filled 2020-12-15 (×6): qty 3

## 2020-12-15 MED ORDER — SODIUM CHLORIDE 0.9 % IV SOLN: 500.0000 mg | INTRAVENOUS | Status: DC

## 2020-12-15 MED ORDER — ENOXAPARIN SODIUM 30 MG/0.3ML ~~LOC~~ SOLN
30.0000 mg | SUBCUTANEOUS | Status: DC
  Administered 2020-12-15 – 2020-12-16 (×2): 30 mg via SUBCUTANEOUS
  Filled 2020-12-15 (×2): qty 0.3

## 2020-12-15 MED ORDER — TORSEMIDE 20 MG PO TABS
20.0000 mg | ORAL_TABLET | Freq: Two times a day (BID) | ORAL | Status: DC
  Administered 2020-12-15 – 2020-12-17 (×5): 20 mg via ORAL
  Filled 2020-12-15 (×5): qty 1

## 2020-12-15 MED ORDER — ENOXAPARIN SODIUM 60 MG/0.6ML ~~LOC~~ SOLN
1.0000 mg/kg | SUBCUTANEOUS | Status: DC
  Filled 2020-12-15: qty 0.6

## 2020-12-15 NOTE — Progress Notes (Signed)
Assessment was completed at 2015

## 2020-12-15 NOTE — Progress Notes (Signed)
*  PRELIMINARY RESULTS* Echocardiogram 2D Echocardiogram has been performed.  [name] Fol[place] 12/15/2020, 11:21 AM

## 2020-12-15 NOTE — Plan of Care (Signed)
  Problem: Health Behavior/Discharge Planning: Goal: Ability to manage health-related needs will improve Outcome: Progressing   Problem: Clinical Measurements: Goal: Will remain free from infection Outcome: Progressing   Problem: Clinical Measurements: Goal: Respiratory complications will improve Outcome: Progressing   

## 2020-12-15 NOTE — Evaluation (Signed)
Physical Therapy Evaluation Patient Details Name: [name] MRN: [id number] DOB: June 26, 1941 Today's Date: 12/15/2020   History of Present Illness  [name] is an 85yoF who [facility] c SOB. Pt has elevated BNP suspected CHF exacerbation. Pt also in COPD exacerbation. 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  Clinical Impression  Pt admitted with above diagnosis. Pt currently with functional limitations due to the deficits listed below (see "PT Problem List"). Upon entry, pt in bed, awake and agreeable to participate. The pt is alert, pleasant, interactive, and able to provide info regarding prior level of function, both in tolerance and independence. Patient's performance this date reveals decreased ability, independence, and tolerance in performing all basic mobility required for performance of activities of daily living. Pt requires additional DME, close physical assistance, and cues for safe participate in mobility. Pt requires > baseline 4L flow rate to perform ADL mobility, desaturates with transfers on 6L. Pt denies any acute exacerbation of baseline strength/balance deficits. Pt will benefit from skilled PT intervention to increase independence and safety with basic mobility in preparation for discharge to the venue listed below.       Follow Up Recommendations No PT follow up;Supervision for mobility/OOB    Equipment Recommendations  None recommended by PT    Recommendations for Other Services       Precautions / Restrictions Precautions Precautions: Fall Restrictions Weight Bearing Restrictions: No      Mobility  Bed Mobility Overal bed mobility: Modified Independent                  Transfers Overall transfer level: Needs assistance Equipment used: 1 person hand held assist Transfers: Sit to/from Stand           General transfer comment: 5x from EOB, HHA 6L, terminal SpO2:  87%  Ambulation/Gait Ambulation/Gait assistance:  (deferred; increased O2 needs; all other activities at baseline, no acute weakness.)              Stairs            Wheelchair Mobility    Modified Rankin (Stroke Patients Only)       Balance Overall balance assessment: Modified Independent;Mild deficits observed, not formally tested (baseline unsteadiness, good safety awareness, safe practices)                                           Pertinent Vitals/Pain Pain Score: 0-No pain Pain Location: hip OA Right Pain Intervention(s): Limited activity within patient's tolerance;Monitored during session    Home Living Family/patient expects to be discharged to:: Private residence Living Arrangements: Children (Son, DIL, 2 adult grandDTRs, 2 baby grands)   Type of Home: Mobile home Home Access: Stairs to enter;Ramped entrance Entrance Stairs-Rails: Right;Left Entrance Stairs-Number of Steps: 4 Home Layout: One level Home Equipment: Walker - 2 wheels;Transport chair Additional Comments: On O2 4L baseline; takes sponge baths at sink, uses depends for intermitten tbowel issues.    Prior Function Level of Independence: Independent with assistive device(s)         Comments: RW in house, transport chair for community access; Rt hip pain (chronic degenerative OA)     Hand Dominance   Dominant Hand: Right    Extremity/Trunk Assessment   Upper Extremity Assessment Upper Extremity Assessment: Generalized weakness;Overall [facility] for tasks assessed    Lower  Extremity Assessment Lower Extremity Assessment: Generalized weakness;Overall [facility] for tasks assessed    Cervical / Trunk Assessment Cervical / Trunk Assessment: Normal  Communication      Cognition Arousal/Alertness: Awake/alert Behavior During Therapy: WFL for tasks assessed/performed Overall Cognitive Status: Within Functional Limits for tasks assessed                                         General Comments      Exercises     Assessment/Plan    PT Assessment Patient needs continued PT services  PT Problem List Decreased activity tolerance;Decreased mobility;Decreased balance;Cardiopulmonary status limiting activity       PT Treatment Interventions DME instruction;Balance training;Gait training;Stair training;Functional mobility training;Therapeutic activities;Therapeutic exercise;Patient/family education    PT Goals (Current goals can be found in the Care Plan section)  Acute Rehab PT Goals Patient Stated Goal: Recover dyspnea issue PT Goal Formulation: With patient Time For Goal Achievement: 12/29/20 Potential to Achieve Goals: Good    Frequency Min 2X/week   Barriers to discharge   O2 needs    Co-evaluation               AM-PAC PT "6 Clicks" Mobility  Outcome Measure Help needed turning from your back to your side while in a flat bed without using bedrails?: None Help needed moving from lying on your back to sitting on the side of a flat bed without using bedrails?: None Help needed moving to and from a bed to a chair (including a wheelchair)?: A Little Help needed standing up from a chair using your arms (e.g., wheelchair or bedside chair)?: A Little Help needed to walk in hospital room?: A Little Help needed climbing 3-5 steps with a railing? : A Little 6 Click Score: 20    End of Session Equipment Utilized During Treatment: Oxygen Activity Tolerance: Patient tolerated treatment well;No increased pain Patient left: in bed;with call bell/phone within reach Nurse Communication: Other (comment) (bed has no callbell with position adjustment) PT Visit Diagnosis: Difficulty in walking, not elsewhere classified (R26.2)    Time: 5701-7793 PT Time Calculation (min) (ACUTE ONLY): 25 min   Charges:   PT Evaluation $PT Eval Moderate Complexity: 1 Mod          5:07 PM, 12/15/20 Etta Grandchild, PT, DPT Physical Therapist - [facility]  [phone] ([place])   [place] 12/15/2020, 5:05 PM

## 2020-12-15 NOTE — Consult Note (Signed)
ANTICOAGULATION CONSULT NOTE - Consult  Pharmacy Consult for Lovenox Indication: chest pain/ACS (NSTEMI)  Allergies  Allergen Reactions  . Mucinex D [Pseudoephedrine-Guaifenesin Er]     Rash on neck    Patient Measurements: Height: 5\' 2"  (157.5 cm) Weight: 48.1 kg (106 lb) IBW/kg (Calculated) : 50.1 Heparin Dosing Weight: 48.1kg  Vital Signs: BP: 97/53 (12/16 0400) Pulse Rate: 65 (12/16 0400)  Labs: Recent Labs    12/14/20 1107 12/14/20 1341 12/14/20 1912 12/15/20 0116 12/15/20 0339  HGB 8.8*  --   --   --   --   HCT 29.2*  --   --   --   --   PLT 219  --   --   --   --   CREATININE 1.09*  --   --   --  1.22*  TROPONINIHS 38* 55* 46* 34*  --     Estimated Creatinine Clearance: 27.5 mL/min (A) (by C-G formula based on SCr of 1.22 mg/dL (H)).   Medications:  No AC pertinent drug allergies Inpatient - Aspirin 325mg  x1. Outpatient - no relevant interactions.  Assessment: CC: SOB, CP --> NSTEMI PMH: COPD, HTN, HLD.  Heparin Dosing Weight: 48.1kg; PLTs 219; H/H 8.8/29.2   Goal of Therapy:  Anti-Xa level 0.6-1 units/ml 4hrs after LMWH dose given  Monitor platelets by anticoagulation protocol: Yes   Plan:  Lovenox 1mg /kg q24h; will monitor renal function and adjust by anti-Xa levels. (adjusted dose based on decreased renal function - CrCL now < 68ml/min)  Lu Duffel, PharmD, BCPS Clinical Pharmacist 12/15/2020 7:49 AM

## 2020-12-15 NOTE — Consult Note (Signed)
     Heart Failure Nurse Navigator Note  HF--echocardiogram is pending at this time.  She presented from home to the ED with complaints of SOB, 02 saturations in the 70's, increased wheezing.  She has noticed the sob for one month, worsening the last 3 to 4 days.  She denies any PND or orthopnea.     CXR revealed mild pulmonary vascular congestion with interstitial edema with right pleural effusion.   Co morbidities:  02 dependant COPD Diabetes type 2 CKD Hypertension Hyperlipidemia  Medication:  Lipitor 20 mg daily Coreg 6.25 mg twice a day Zestril 5 mg daily Potassium chloride 10 meq daily Torsemide 20 mg twice a day   Labs:  Sodium 141, potassium 4.1, chloride 102, CO2 29, BUN 18 creatinine 1.22, albumin 3.3, troponin I a 9, 34, 46, 55, 38, total cholesterol 107, triglyceride 48, HDL 40 and LDL not calculated, hemoglobin A1c was 5.3.  Weight is 57.7 kg BMI 23.28 Blood pressure 128/56   Assessment:  General-she is awake and alert visiting with son.  No acute distress noted.  HEENT-pupils are equal, normocephalic, no JVD.  Cardiac-heart tones are regular rate and rhythm, +murmur.  Chest-lungs are clear to posterior auscultation, no wheezes or rhonchi noted.   Abdomen-soft flat nontender.  Musculoskeletal-no lower lower extremity edema noted.  Pulses palpable.  Neurologic-speech is clear, moves all extremities without difficulty.  Psych-is pleasant and appropriate makes good eye contact.    Initial visit with patient and her son.  Signs and symptoms to report to physician.  Along with daily weights and recording.     Discussed  heart failure.  She states this is a new diagnosis for her.  Talked about the importance of removing salt from her diet and continue with a low sodium diet.  She tried negotiating with her son to use a little bit of salt at the table.  Recommended using Mrs. Dash as a seasoning.  She was given heart failure teaching booklet along  with his own magnet and holiday handout.    Told her that her echocardiogram report is pending at this time.   Pricilla Riffle RN, CHFN

## 2020-12-15 NOTE — Consult Note (Signed)
CARDIOLOGY CONSULT NOTE               Patient ID: [name] MRN: [id number] DOB/AGE: [birth year] 79 y.o.  Admit date: 12/14/2020 Referring Physician Dr. Harvie Bridge  Primary Physician Dr. 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  Workup in the ED included O2 sats at in the mid 70s on 4L, which improved with nonrebreather, hemoglobin of 8.8, WBC of 11.9, creatinine of 1.09 with a GFR of 51, high sensitivity troponin borderline elevated x 5 at 38, 55, 46, 34, and 29 respectively, BNP of 376.5, and chest xray revealing mild pulmonary vascular congestion with a right pleural effusion; interstitial edema.  ECG pending   12/15/20: [name] is lying in bed, in mild respiratory distress.  She reports a month long history of worsening dyspnea that progressed over the past 3-4 days.  She denies associated lower extremity swelling, orthopnea, or PND.  She denies chest pain or chest pressure.  She denies heart racing or palpitations.  She denies syncopal or presyncopal episodes.  She reports no prior cardiac history or evaluation for such.  She has been on torsemide 20mg  daily for several years.   Review of systems complete and found to be negative unless listed above     Past Medical History:  Diagnosis Date  . COPD (chronic obstructive pulmonary disease) ([place])   . Environmental allergies   . High blood pressure   . High cholesterol     Past Surgical History:  Procedure Laterality Date  . BREAST LUMPECTOMY Bilateral 1962 (L) 2002(R)  . VESICOVAGINAL FISTULA CLOSURE W/ TAH  1975    (Not in a hospital admission)  Social History   Socioeconomic History  .  Marital status: Widowed    Spouse name: Not on file  . Number of children: Not on file  . Years of education: Not on file  . Highest education level: Not on file  Occupational History  . Not on file  Tobacco Use  . Smoking status: Former Smoker    Packs/day: 1.00    Years: 45.00    Pack years: 45.00    Types: Cigarettes    Quit date: 07/03/2013    Years since quitting: 7.4  . Smokeless tobacco: Never Used  Substance and Sexual Activity  . Alcohol use: Yes    Alcohol/week: 0.0 standard drinks    Comment: occasional glass of wine  . Drug use: No  . Sexual activity: Not on file  Other Topics Concern  . Not on file  Social History Narrative  . Not on file   Social Determinants of Health   Financial Resource Strain: Not on file  Food Insecurity: Not on file  Transportation Needs: Not on file  Physical Activity: Not on file  Stress: Not on file  Social Connections: Not on file  Intimate Partner Violence: Not on file    Family History  Problem Relation Age of Onset  . Heart disease Father   . Rheum arthritis Mother   . Cancer Mother        breast  . Heart disease Brother        2 brothers  . Cancer Maternal Aunt  cervical  . Cancer Maternal Aunt        stomach      Review of systems complete and found to be negative unless listed above      PHYSICAL EXAM  General: Thin, frail, in mild respiratory distress HEENT:  Normocephalic and atramatic Neck:  No JVD.  Lungs: On supplemental O2; Inspiratory wheezes noted in upper lung fields bilaterally Heart: HRRR . Normal S1 and S2 without gallops or murmurs.  Abdomen: Bowel sounds are positive, abdomen soft and non-tender  Msk:  Back normal. Normal strength and tone for age. Extremities: No clubbing, cyanosis or edema.   Neuro: Alert and oriented X 3. Psych:  Good affect, responds appropriately  Labs:   Lab Results  Component Value Date   WBC 11.9 (H) 12/14/2020   HGB 8.8 (L) 12/14/2020   HCT 29.2 (L)  12/14/2020   MCV 87.7 12/14/2020   PLT 219 12/14/2020    Recent Labs  Lab 12/14/20 1107 12/15/20 0339  NA 138 141  K 4.0 4.1  CL 104 102  CO2 26 29  BUN 16 18  CREATININE 1.09* 1.22*  CALCIUM 10.0 9.5  PROT 7.1  --   BILITOT 0.7  --   ALKPHOS 67  --   ALT 9  --   AST 17  --   GLUCOSE 156* 162*   Lab Results  Component Value Date   TROPONINI < 0.02 07/03/2013    Lab Results  Component Value Date   CHOL 107 12/14/2020   Lab Results  Component Value Date   HDL 40 (L) 12/14/2020   Lab Results  Component Value Date   LDLCALC NOT CALCULATED 12/14/2020   Lab Results  Component Value Date   TRIG 48 12/14/2020   Lab Results  Component Value Date   CHOLHDL 2.7 12/14/2020   No results found for: LDLDIRECT    Radiology: DG Chest Portable 1 View  Result Date: 12/14/2020 CLINICAL DATA:  Shortness of breath EXAM: PORTABLE CHEST 1 VIEW COMPARISON:  October 17, 2018 FINDINGS: There is a right pleural effusion. There is interstitial edema with patchy airspace opacity in the right base. Heart is upper normal in size with apparent mild pulmonary venous hypertension. There is aortic atherosclerosis. No adenopathy. No bone lesions IMPRESSION: Suspect mild pulmonary vascular congestion. Right pleural effusion with interstitial edema raises question of a degree of underlying congestive heart failure. Ill-defined opacity right base likely focus of pneumonia, although edema in this area could present similarly. Aortic Atherosclerosis (ICD10-I70.0). Electronically Signed   By: Lowella Grip III M.D.   On: 12/14/2020 11:33    EKG: Pending   ASSESSMENT AND PLAN:  1.  Acute CHF   -New onset, Interstitial edema and pulmonary vascular congestion noted on chest xray; BNP elevated   -Has received Lasix injection x 1; will increase torsemide from 20mg  daily to 40mg  daily with close monitoring of renal function and BP   -Echocardiogram ordered   2.  Elevated troponin   -Borderline  elevated, but flat; likely demand ischemia in the setting of new onset CHF   -ECG pending, but no further plans for invasive workup at this time   3.  Acute on chronic COPD exacerbation   -Currently receiving nebulizer treatments, Zithromax   4.  Type 2 diabetes  -Diet controlled   5.  Hypertension   -Normotensive this morning   -Will continue carvedilol, lisinopril, and torsemide and make further recommendations pending echocardiogram results   6.  Hyperlipidemia   -  Continue atorvastatin 20mg  daily   The history, physical exam findings, and plan of care were all discussed with Dr. Bartholome Bill, and all decision making was made in collaboration.   Signed: Avie Arenas PA-C 12/15/2020, 8:54 AM

## 2020-12-15 NOTE — ED Notes (Signed)
Pt in bed resting with eyes closed, night RN just assisted pt

## 2020-12-15 NOTE — ED Notes (Signed)
Pt resting in bed with eyes closed, ED night Rn just leaving room after blood collection, pt tolerated well. Call light in reach, bed locked and low.

## 2020-12-15 NOTE — Progress Notes (Signed)
PROGRESS NOTE    [name]  ZSW:[id number] DOB: 1941/08/21 DOA: 12/14/2020 PCP: [name], MD   Brief Narrative:  79 y.o. 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  Patient was in a usual state of health until today when she noticed worsening SOB, increased wheezing and an increase in her O2 requirement.  She rec'd SoluMedrol, Duoneb and was placed on a NRB prior to arrival.  .  Patient denies fevers/chills, weakness, dizziness, chest pain,  N/V/C/D, abdominal pain, dysuria/frequency, changes in mental status.   12/16: Patient still with some dyspnea.  Reports that her home rate of oxygen is 4 L however she is 97% on this rate.   Assessment & Plan:   Active Problems:   CHF (congestive heart failure) ([place])  Suspected new onset congestive heart failure, unknown type Elevated BNP Mild interstitial edema on CXR Cards on consult Patient reports adherence with home demedex Plan: Increase demadex to 40 PO QD Monitor UOP Daily weights Strict I/O BB, statin, ASA  Acute exacerbation of COPD Acute on chronic hypoxic respiratory failure Patient with 4 L baseline requirement Returned to 4 L at time of my evaluation today Plan: IV solumedrol 40 q8 Scheduled duoneb Scheduled brovana Scheduled pulmicort Wean oxygen as tolerated Pulse oximetry  Hyperlipidemia Lipitor  Acute kidney injury Mild Baseline creatinine 0.96 Cautious while on diuretics Avoid nephrotoxins    DVT prophylaxis: Lovenox Code Status: Full Family Communication:  Disposition Plan:Status is: Inpatient  Remains inpatient appropriate because:Inpatient level of care appropriate due to severity of illness   Dispo: The patient is from: Home              Anticipated d/c is to: Home              Anticipated d/c date is: 2 days              Patient currently is not  medically stable to d/c.  Decompensated COPD.  Possible new onset CHF      Consultants:   Cardiology  Procedures:   None  Antimicrobials:   Azithromycin   Subjective: Seen and examined.  Dyspneic.  No pain complaints  Objective: Vitals:   12/15/20 0914 12/15/20 0958 12/15/20 1155 12/15/20 1443  BP: 121/65 125/63 (!) 128/56   Pulse: 78 73 82 87  Resp: (!) 21 18 19 20   Temp: 97.8 F (36.6 C) 98.1 F (36.7 C) 98 F (36.7 C)   TempSrc: Oral Oral Oral   SpO2: 94% 93% 91% 92%  Weight:  57.7 kg    Height:  5\' 2"  (1.575 m)      Intake/Output Summary (Last 24 hours) at 12/15/2020 1452 Last data filed at 12/15/2020 1345 Gross per 24 hour  Intake 120 ml  Output 1000 ml  Net -880 ml   Filed Weights   12/14/20 1104 12/15/20 0958  Weight: 48.1 kg 57.7 kg    Examination:  General exam: Mild respiratory distress Respiratory system: Scattered crackles BL, diffusely decreased breath sounds Cardiovascular system: S1 & S2 heard, RRR. No JVD, murmurs, rubs, gallops or clicks. No pedal edema. Gastrointestinal system: Abdomen is nondistended, soft and nontender. No organomegaly or masses felt. Normal bowel sounds heard. Central nervous system: Alert and oriented. No focal neurological deficits. Extremities: Symmetric 5 x 5 power. Skin: No rashes, lesions or ulcers Psychiatry: Judgement and insight appear normal. Mood & affect appropriate.  Data Reviewed: I have personally reviewed following labs and imaging studies  CBC: Recent Labs  Lab 12/14/20 1107  WBC 11.9*  HGB 8.8*  HCT 29.2*  MCV 87.7  PLT 902   Basic Metabolic Panel: Recent Labs  Lab 12/14/20 1107 12/15/20 0339  NA 138 141  K 4.0 4.1  CL 104 102  CO2 26 29  GLUCOSE 156* 162*  BUN 16 18  CREATININE 1.09* 1.22*  CALCIUM 10.0 9.5   GFR: Estimated Creatinine Clearance: 28.6 mL/min (A) (by C-G formula based on SCr of 1.22 mg/dL (H)). Liver Function Tests: Recent Labs  Lab 12/14/20 1107   AST 17  ALT 9  ALKPHOS 67  BILITOT 0.7  PROT 7.1  ALBUMIN 3.3*   No results for input(s): LIPASE, AMYLASE in the last 168 hours. No results for input(s): AMMONIA in the last 168 hours. Coagulation Profile: No results for input(s): INR, PROTIME in the last 168 hours. Cardiac Enzymes: No results for input(s): CKTOTAL, CKMB, CKMBINDEX, TROPONINI in the last 168 hours. BNP (last 3 results) No results for input(s): PROBNP in the last 8760 hours. HbA1C: Recent Labs    12/14/20 1912  HGBA1C 5.3   CBG: Recent Labs  Lab 12/14/20 1727 12/14/20 1808 12/14/20 2228 12/15/20 0912 12/15/20 1156  GLUCAP 181* 171* 158* 145* 163*   Lipid Profile: Recent Labs    12/14/20 1912  CHOL 107  HDL 40*  LDLCALC NOT CALCULATED  TRIG 48  CHOLHDL 2.7   Thyroid Function Tests: Recent Labs    12/14/20 1912  TSH 0.785   Anemia Panel: No results for input(s): VITAMINB12, FOLATE, FERRITIN, TIBC, IRON, RETICCTPCT in the last 72 hours. Sepsis Labs: No results for input(s): PROCALCITON, LATICACIDVEN in the last 168 hours.  Recent Results (from the past 240 hour(s))  Resp Panel by RT-PCR (Flu A&B, Covid) Nasopharyngeal Swab     Status: None   Collection Time: 12/14/20 11:07 AM   Specimen: Nasopharyngeal Swab; Nasopharyngeal(NP) swabs in vial transport medium  Result Value Ref Range Status   SARS Coronavirus 2 by RT PCR NEGATIVE NEGATIVE Final    Comment: (NOTE) SARS-CoV-2 target nucleic acids are NOT DETECTED.  The SARS-CoV-2 RNA is generally detectable in upper respiratory specimens during the acute phase of infection. The lowest concentration of SARS-CoV-2 viral copies this assay can detect is 138 copies/mL. A negative result does not preclude SARS-Cov-2 infection and should not be used as the sole basis for treatment or other patient management decisions. A negative result may occur with  improper specimen collection/handling, submission of specimen other than nasopharyngeal swab,  presence of viral mutation(s) within the areas targeted by this assay, and inadequate number of viral copies(<138 copies/mL). A negative result must be combined with clinical observations, patient history, and epidemiological information. The expected result is Negative.  Fact Sheet for Patients:  EntrepreneurPulse.com.au  Fact Sheet for Healthcare Providers:  IncredibleEmployment.be  This test is no t yet approved or cleared by the [place] FDA and  has been authorized for detection and/or diagnosis of SARS-CoV-2 by FDA under an Emergency Use Authorization (EUA). This EUA will remain  in effect (meaning this test can be used) for the duration of the COVID-19 declaration under Section 564(b)(1) of the Act, 21 U.S.C.section 360bbb-3(b)(1), unless the authorization is terminated  or revoked sooner.       Influenza A by PCR NEGATIVE NEGATIVE Final   Influenza B by PCR NEGATIVE NEGATIVE Final    Comment: (NOTE) The Xpert Xpress SARS-CoV-2/FLU/RSV plus  assay is intended as an aid in the diagnosis of influenza from Nasopharyngeal swab specimens and should not be used as a sole basis for treatment. Nasal washings and aspirates are unacceptable for Xpert Xpress SARS-CoV-2/FLU/RSV testing.  Fact Sheet for Patients: EntrepreneurPulse.com.au  Fact Sheet for Healthcare Providers: IncredibleEmployment.be  This test is not yet approved or cleared by the [place] FDA and has been authorized for detection and/or diagnosis of SARS-CoV-2 by FDA under an Emergency Use Authorization (EUA). This EUA will remain in effect (meaning this test can be used) for the duration of the COVID-19 declaration under Section 564(b)(1) of the Act, 21 U.S.C. section 360bbb-3(b)(1), unless the authorization is terminated or revoked.  Performed at [facility], [address] [place], [place] [address]           Radiology Studies: DG Chest Portable 1 View  Result Date: 12/14/2020 CLINICAL DATA:  Shortness of breath EXAM: PORTABLE CHEST 1 VIEW COMPARISON:  October 17, 2018 FINDINGS: There is a right pleural effusion. There is interstitial edema with patchy airspace opacity in the right base. Heart is upper normal in size with apparent mild pulmonary venous hypertension. There is aortic atherosclerosis. No adenopathy. No bone lesions IMPRESSION: Suspect mild pulmonary vascular congestion. Right pleural effusion with interstitial edema raises question of a degree of underlying congestive heart failure. Ill-defined opacity right base likely focus of pneumonia, although edema in this area could present similarly. Aortic Atherosclerosis (ICD10-I70.0). Electronically Signed   By: Lowella Grip III M.D.   On: 12/14/2020 11:33   ECHOCARDIOGRAM COMPLETE  Result Date: 12/15/2020    ECHOCARDIOGRAM REPORT   Patient Name:   [name] [facility] Date of Exam: 12/15/2020 Medical Rec #:  [id number]        Height:       62.0 in Accession #:    [id number]       Weight:       127.3 lb Date of Birth:  08-01-1941        BSA:          1.578 m Patient Age:    87 years         BP:           125/63 mmHg Patient Gender: F                HR:           73 bpm. Exam Location:  [facility] Procedure: 2D Echo, Color Doppler and Cardiac Doppler Indications:     I50.9 Congestive Heart Failure  History:         Patient has no prior history of Echocardiogram examinations.                  COPD; Risk Factors:Hypertension and HCL.  Sonographer:     Charmayne Sheer RDCS (AE) Referring Phys:  [id number] Harvie Bridge Diagnosing Phys: Bartholome Bill MD IMPRESSIONS  1. Left ventricular ejection fraction, by estimation, is 60 to 65%. The left ventricle has normal function. The left ventricle has no regional wall motion abnormalities. There is mild left ventricular hypertrophy. Left ventricular diastolic parameters are consistent with Grade I diastolic  dysfunction (impaired relaxation).  2. Right ventricular systolic function is normal. The right ventricular size is normal.  3. The mitral valve was not well visualized. Trivial mitral valve regurgitation.  4. The aortic valve is grossly normal. Aortic valve regurgitation is not visualized. FINDINGS  Left Ventricle: Left ventricular ejection fraction, by estimation, is 60 to 65%.  The left ventricle has normal function. The left ventricle has no regional wall motion abnormalities. The left ventricular internal cavity size was normal in size. There is  mild left ventricular hypertrophy. Left ventricular diastolic parameters are consistent with Grade I diastolic dysfunction (impaired relaxation). Right Ventricle: The right ventricular size is normal. No increase in right ventricular wall thickness. Right ventricular systolic function is normal. Left Atrium: Left atrial size was normal in size. Right Atrium: Right atrial size was normal in size. Pericardium: There is no evidence of pericardial effusion. Mitral Valve: The mitral valve was not well visualized. Trivial mitral valve regurgitation. MV peak gradient, 4.1 mmHg. The mean mitral valve gradient is 2.0 mmHg. Tricuspid Valve: The tricuspid valve is grossly normal. Tricuspid valve regurgitation is trivial. Aortic Valve: The aortic valve is grossly normal. Aortic valve regurgitation is not visualized. Aortic valve mean gradient measures 4.0 mmHg. Aortic valve peak gradient measures 6.8 mmHg. Aortic valve area, by VTI measures 2.04 cm. Pulmonic Valve: The pulmonic valve was not well visualized. Pulmonic valve regurgitation is not visualized. Aorta: The aortic root is normal in size and structure. IAS/Shunts: The atrial septum is grossly normal.  LEFT VENTRICLE PLAX 2D LVIDd:         4.63 cm  Diastology LVIDs:         2.83 cm  LV e' medial:    5.77 cm/s LV PW:         0.94 cm  LV E/e' medial:  17.0 LV IVS:        0.74 cm  LV e' lateral:   5.22 cm/s LVOT diam:     1.80  cm  LV E/e' lateral: 18.8 LV SV:         58 LV SV Index:   37 LVOT Area:     2.54 cm  RIGHT VENTRICLE RV Basal diam:  3.25 cm LEFT ATRIUM             Index       RIGHT ATRIUM           Index LA diam:        3.30 cm 2.09 cm/m  RA Area:     12.70 cm LA Vol (A2C):   36.7 ml 23.26 ml/m RA Volume:   28.20 ml  17.87 ml/m LA Vol (A4C):   30.8 ml 19.52 ml/m LA Biplane Vol: 34.0 ml 21.55 ml/m  AORTIC VALVE AV Area (Vmax):    2.07 cm AV Area (Vmean):   1.99 cm AV Area (VTI):     2.04 cm AV Vmax:           130.00 cm/s AV Vmean:          90.100 cm/s AV VTI:            0.285 m AV Peak Grad:      6.8 mmHg AV Mean Grad:      4.0 mmHg LVOT Vmax:         106.00 cm/s LVOT Vmean:        70.500 cm/s LVOT VTI:          0.228 m LVOT/AV VTI ratio: 0.80  AORTA Ao Root diam: 3.00 cm MITRAL VALVE                TRICUSPID VALVE MV Area (PHT): 4.31 cm     TR Peak grad:   52.7 mmHg MV Peak grad:  4.1 mmHg     TR Vmax:  363.00 cm/s MV Mean grad:  2.0 mmHg MV Vmax:       1.01 m/s     SHUNTS MV Vmean:      64.4 cm/s    Systemic VTI:  0.23 m MV Decel Time: 176 msec     Systemic Diam: 1.80 cm MV E velocity: 98.00 cm/s MV A velocity: 102.00 cm/s MV E/A ratio:  0.96 Bartholome Bill MD Electronically signed by Bartholome Bill MD Signature Date/Time: 12/15/2020/2:00:59 PM    Final         Scheduled Meds: . arformoterol  15 mcg Nebulization BID  . atorvastatin  20 mg Oral Daily  . budesonide (PULMICORT) nebulizer solution  0.25 mg Nebulization BID  . carvedilol  6.25 mg Oral BID WC  . enoxaparin (LOVENOX) injection  1 mg/kg Subcutaneous Q24H  . insulin aspart  0-15 Units Subcutaneous TID WC  . insulin aspart  0-5 Units Subcutaneous QHS  . ipratropium-albuterol  3 mL Nebulization Q6H  . lisinopril  5 mg Oral Daily  . methylPREDNISolone (SOLU-MEDROL) injection  40 mg Intravenous Q8H  . potassium chloride  10 mEq Oral Daily  . sodium chloride flush  3 mL Intravenous Q12H  . torsemide  20 mg Oral BID   Continuous  Infusions: . sodium chloride    . azithromycin       LOS: 1 day    Time spent: 25 minutes    Sidney Ace, MD [facility] Pager [phone]  If 7PM-7AM, please contact night-coverage 12/15/2020, 2:52 PM

## 2020-12-16 DIAGNOSIS — E43 Unspecified severe protein-calorie malnutrition: Secondary | ICD-10-CM | POA: Insufficient documentation

## 2020-12-16 LAB — GLUCOSE, CAPILLARY
Glucose-Capillary: 169 mg/dL — ABNORMAL HIGH (ref 70–99)
Glucose-Capillary: 205 mg/dL — ABNORMAL HIGH (ref 70–99)
Glucose-Capillary: 163 mg/dL — ABNORMAL HIGH (ref 70–99)
Glucose-Capillary: 145 mg/dL — ABNORMAL HIGH (ref 70–99)

## 2020-12-16 LAB — BASIC METABOLIC PANEL
Anion gap: 13 (ref 5–15)
BUN: 31 mg/dL — ABNORMAL HIGH (ref 8–23)
CO2: 29 mmol/L (ref 22–32)
Calcium: 10.1 mg/dL (ref 8.9–10.3)
Chloride: 99 mmol/L (ref 98–111)
Creatinine, Ser: 1.21 mg/dL — ABNORMAL HIGH (ref 0.44–1.00)
GFR, Estimated: 45 mL/min — ABNORMAL LOW (ref >60)
Glucose, Bld: 159 mg/dL — ABNORMAL HIGH (ref 70–99)
Potassium: 3.6 mmol/L (ref 3.5–5.1)
Sodium: 141 mmol/L (ref 135–145)

## 2020-12-16 MED ORDER — IPRATROPIUM-ALBUTEROL 0.5-2.5 (3) MG/3ML IN SOLN
3.0000 mL | Freq: Three times a day (TID) | RESPIRATORY_TRACT | Status: DC
  Administered 2020-12-17: 08:00:00 3 mL via RESPIRATORY_TRACT
  Filled 2020-12-16: qty 3

## 2020-12-16 MED ORDER — ENSURE ENLIVE PO LIQD
237.0000 mL | Freq: Two times a day (BID) | ORAL | Status: DC
  Administered 2020-12-16 – 2020-12-17 (×3): 237 mL via ORAL

## 2020-12-16 MED ORDER — ADULT MULTIVITAMIN W/MINERALS CH
1.0000 | ORAL_TABLET | Freq: Every day | ORAL | Status: DC
  Administered 2020-12-17: 11:00:00 1 via ORAL
  Filled 2020-12-16: qty 1

## 2020-12-16 NOTE — Plan of Care (Signed)
Pt in bed. Pt is alert and oriented x 4. Pt states she is not in any pain. Pt was upset that the light over her bed could not be turned off due to a malfunction. Pt was able to get some sleep. Will continue pt care.   Problem: Education: Goal: Knowledge of General Education information will improve Description: Including pain rating scale, medication(s)/side effects and non-pharmacologic comfort measures Outcome: Progressing   Problem: Health Behavior/Discharge Planning: Goal: Ability to manage health-related needs will improve Outcome: Progressing   Problem: Clinical Measurements: Goal: Will remain free from infection Outcome: Progressing   Problem: Clinical Measurements: Goal: Respiratory complications will improve Outcome: Progressing   Problem: Coping: Goal: Level of anxiety will decrease Outcome: Progressing   Problem: Activity: Goal: Risk for activity intolerance will decrease Outcome: Progressing

## 2020-12-16 NOTE — Progress Notes (Addendum)
Initial Nutrition Assessment  DOCUMENTATION CODES:   Severe malnutrition in context of chronic illness  INTERVENTION:   Ensure Enlive po BID, each supplement provides 350 kcal and 20 grams of protein  Liberalize diet  Low sodium diet education  Pt at high refeed risk; recommend monitor potassium, magnesium and phosphorus labs daily until stable  NUTRITION DIAGNOSIS:   Severe Malnutrition related to chronic illness (COPD, CHF, CKD) as evidenced by moderate fat depletion, severe fat depletion, moderate muscle depletion, severe muscle depletion, 28 percent weight loss in one year.  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   Met with pt in room today. Pt reports poor appetite and oral intake at baseline; pt reports that she just has no desire to eat. Pt reports that her appetite has improved some in hospital; pt is documented to be eating 50-100%. Pt's main complaint is the taste of the hospital food. Per chart, pt is down 48lbs(28%) over the past year; this is severe weight loss. Pt is aware of the weight loss and reports this is secondary to her husband passing away last year along with a previous hospitalization in July. RD discussed with pt today the importance of adequate nutrition needed to preserve lean muscle. Encouraged pt to use nutritional supplements to help her meet her estimated needs. RD also provided pt with ways to increased her calorie and protein intake at home. Pt is willing to drink chocolate Ensure in hospital. RD will add  supplements and MVI to help pt meet her estimated needs. RD will also liberalize the heart healthy portion of pt's diet as this is restrictive of protein.  RD provided brief low sodium diet education at bedside today. Education was mainly focused on avoiding processed foods and eating out at restaurants. RD also discussed with pt the importance of weighing herself daily.   Medications reviewed and include: lovenox, insulin, solu-medrol, KCl, torsemide, azithromycin   Labs reviewed: BUN 31(H), creat 1.21(H) Wbc- 11.9(H), Hgb 8.8(L), Hct 29.2(L) cbgs- 169, 205 x 24 hrs AIC 5.3- 12/15  NUTRITION - FOCUSED PHYSICAL EXAM:  Flowsheet Row Most Recent Value  Orbital Region Mild depletion  Upper Arm Region Severe depletion  Thoracic and Lumbar Region Moderate depletion  Buccal Region Moderate depletion  Temple Region Moderate depletion  Clavicle Bone Region Severe depletion  Clavicle and Acromion Bone Region Severe depletion  Scapular Bone Region Moderate depletion  Dorsal Hand Severe depletion  Patellar Region Moderate depletion  Anterior Thigh Region Moderate depletion  Posterior Calf Region Moderate depletion  Edema (RD Assessment) None  Hair Reviewed  Eyes Reviewed  Mouth Reviewed  Skin Reviewed  Nails Reviewed     Diet Order:   Diet Order            Diet 2 gram sodium Room service appropriate? Yes; Fluid consistency: Thin  Diet effective now                EDUCATION NEEDS:   Education needs have been addressed  Skin:  Skin Assessment: Reviewed RN Assessment (ecchymosis)  Last  BM:  12/15  Height:   Ht Readings from Last 1 Encounters:  12/15/20 5' 2"  (1.575 m)    Weight:   Wt Readings from Last 1 Encounters:  12/16/20 57.2 kg    Ideal Body Weight:  50 kg  BMI:  Body mass index is 23.05 kg/m.  Estimated Nutritional Needs:   Kcal:  1500-1700kcal/day  Protein:  75-85g/day  Fluid:  1.3-1.5L/day  Koleen Distance MS, RD, LDN Please refer to [facility] for RD  and/or RD on-call/weekend/after hours pager

## 2020-12-16 NOTE — Progress Notes (Addendum)
Pt states that it burns when she pees. Pt rates the burning a 6 or 7 on a 0 to 10 rating scale.   Update 12/16/2020 0619: Nurse Tech Marye Round placed barrier cream between the labia and the puerwick. Will update oncoming nurse and tech about the pt stating it burns when she pees.

## 2020-12-16 NOTE — Progress Notes (Signed)
[facility] Cardiology    SUBJECTIVE: [name] is an 79 year old female with a past medical history significant for oxygen dependent COPD, on 4L at baseline, type 2 diabetes, chronic kidney disease, hypertension, and hyperlipidemia who presented to the ED on 12/14/20 for shortness of breath.  Workup in the ED included O2 sats at in the mid 70s on 4L, which improved with nonrebreather, hemoglobin of 8.8, WBC of 11.9, creatinine of 1.09 with a GFR of 51, high sensitivity troponin borderline elevated x 5 at 38, 55, 46, 34, and 29 respectively, BNP of 376.5, and chest xray revealing mild pulmonary vascular congestion with a right pleural effusion; interstitial edema.    12/15/20: [name] is lying in bed, in mild respiratory distress.  She reports a month long history of worsening dyspnea that progressed over the past 3-4 days.  She denies associated lower extremity swelling, orthopnea, or PND.  She denies chest pain or chest pressure.  She denies heart racing or palpitations.  She denies syncopal or presyncopal episodes.  She reports no prior cardiac history or evaluation for such.  She has been on torsemide 20mg  daily for several years.   12/16/20: [name] is lying in bed, in no acute distress.  She reports breathing is about the same on 5L [place], baseline at home is 4L.  She continues to deny chest pain or chest pressure.  She denies lower extremity swelling, orthopnea, or PND.  Her biggest concern is urinary incontinence.     Vitals:   12/15/20 1603 12/15/20 2115 12/15/20 2115 12/16/20 0347  BP:   121/62 123/74  Pulse:   79 81  Resp:   17 17  Temp:   98 F (36.7 C) 98.1 F (36.7 C)  TempSrc:   Oral Oral  SpO2: 91% 93% 95% 96%  Weight:    57.2 kg  Height:         Intake/Output Summary (Last 24 hours) at 12/16/2020 0730 Last data filed at 12/16/2020 0500 Gross per 24 hour  Intake 1063.73 ml  Output 1600 ml  Net -536.27 ml      PHYSICAL EXAM  General: Well developed, well nourished,  in no acute distress HEENT:  Normocephalic and atramatic Neck:  No JVD.  Lungs: Clear bilaterally to auscultation and percussion. Heart: HRRR . Normal S1 and S2 without gallops or murmurs.  Abdomen: Bowel sounds are positive, abdomen soft and non-tender  Msk:  Back normal. Normal strength and tone for age. Extremities: No clubbing, cyanosis or edema.   Neuro: Alert and oriented X 3. Psych:  Good affect, responds appropriately   LABS: Basic Metabolic Panel: Recent Labs    12/15/20 0339 12/16/20 0501  NA 141 141  K 4.1 3.6  CL 102 99  CO2 29 29  GLUCOSE 162* 159*  BUN 18 31*  CREATININE 1.22* 1.21*  CALCIUM 9.5 10.1   Liver Function Tests: Recent Labs    12/14/20 1107  AST 17  ALT 9  ALKPHOS 67  BILITOT 0.7  PROT 7.1  ALBUMIN 3.3*   No results for input(s): LIPASE, AMYLASE in the last 72 hours. CBC: Recent Labs    12/14/20 1107  WBC 11.9*  HGB 8.8*  HCT 29.2*  MCV 87.7  PLT 219   Cardiac Enzymes: No results for input(s): CKTOTAL, CKMB, CKMBINDEX, TROPONINI in the last 72 hours. BNP: Invalid input(s): POCBNP D-Dimer: No results for input(s): DDIMER in the last 72 hours. Hemoglobin A1C: Recent Labs    12/14/20 1912  HGBA1C 5.3   Fasting Lipid Panel: Recent Labs    12/14/20 1912  CHOL 107  HDL 40*  LDLCALC NOT CALCULATED  TRIG 48  CHOLHDL 2.7   Thyroid Function Tests: Recent Labs    12/14/20 1912  TSH 0.785   Anemia Panel: No results for input(s): VITAMINB12, FOLATE, FERRITIN, TIBC, IRON, RETICCTPCT in the last 72 hours.  DG Chest Portable 1 View  Result Date: 12/14/2020 CLINICAL DATA:  Shortness of breath EXAM: PORTABLE CHEST 1 VIEW COMPARISON:  October 17, 2018 FINDINGS: There is a right pleural effusion. There is interstitial edema with patchy airspace opacity in the right base. Heart is upper normal in size with apparent mild pulmonary venous hypertension. There is aortic atherosclerosis. No adenopathy. No bone lesions IMPRESSION:  Suspect mild pulmonary vascular congestion. Right pleural effusion with interstitial edema raises question of a degree of underlying congestive heart failure. Ill-defined opacity right base likely focus of pneumonia, although edema in this area could present similarly. Aortic Atherosclerosis (ICD10-I70.0). Electronically Signed   By: Lowella Grip III M.D.   On: 12/14/2020 11:33   ECHOCARDIOGRAM COMPLETE  Result Date: 12/15/2020    ECHOCARDIOGRAM REPORT   Patient Name:   [name] [facility] Date of Exam: 12/15/2020 Medical Rec #:  [id number]        Height:       62.0 in Accession #:    [id number]       Weight:       127.3 lb Date of Birth:  1941-11-09        BSA:          1.578 m Patient Age:    54 years         BP:           125/63 mmHg Patient Gender: F                HR:           73 bpm. Exam Location:  [facility] Procedure: 2D Echo, Color Doppler and Cardiac Doppler Indications:     I50.9 Congestive Heart Failure  History:         Patient has no prior history of Echocardiogram examinations.                  COPD; Risk Factors:Hypertension and HCL.  Sonographer:     Charmayne Sheer RDCS (AE) Referring Phys:  [id number] Harvie Bridge Diagnosing Phys: Bartholome Bill MD IMPRESSIONS  1. Left ventricular ejection fraction, by estimation, is 60 to 65%. The left ventricle has normal function. The left ventricle has no regional wall motion abnormalities. There is mild left ventricular hypertrophy. Left ventricular diastolic parameters are consistent with Grade I diastolic dysfunction (impaired relaxation).  2. Right ventricular systolic function is normal. The right ventricular size is normal.  3. The mitral valve was not well visualized. Trivial mitral valve regurgitation.  4. The aortic valve is grossly normal. Aortic valve regurgitation is not visualized. FINDINGS  Left Ventricle: Left ventricular ejection fraction, by estimation, is 60 to 65%. The left ventricle has normal function. The left ventricle has no regional wall  motion abnormalities. The left ventricular internal cavity size was normal in size. There is  mild left ventricular hypertrophy. Left ventricular diastolic parameters are consistent with Grade I diastolic dysfunction (impaired relaxation). Right Ventricle: The right ventricular size is normal. No increase in right ventricular wall thickness. Right ventricular systolic function is normal. Left Atrium: Left atrial size was normal in size. Right Atrium:  Right atrial size was normal in size. Pericardium: There is no evidence of pericardial effusion. Mitral Valve: The mitral valve was not well visualized. Trivial mitral valve regurgitation. MV peak gradient, 4.1 mmHg. The mean mitral valve gradient is 2.0 mmHg. Tricuspid Valve: The tricuspid valve is grossly normal. Tricuspid valve regurgitation is trivial. Aortic Valve: The aortic valve is grossly normal. Aortic valve regurgitation is not visualized. Aortic valve mean gradient measures 4.0 mmHg. Aortic valve peak gradient measures 6.8 mmHg. Aortic valve area, by VTI measures 2.04 cm. Pulmonic Valve: The pulmonic valve was not well visualized. Pulmonic valve regurgitation is not visualized. Aorta: The aortic root is normal in size and structure. IAS/Shunts: The atrial septum is grossly normal.  LEFT VENTRICLE PLAX 2D LVIDd:         4.63 cm  Diastology LVIDs:         2.83 cm  LV e' medial:    5.77 cm/s LV PW:         0.94 cm  LV E/e' medial:  17.0 LV IVS:        0.74 cm  LV e' lateral:   5.22 cm/s LVOT diam:     1.80 cm  LV E/e' lateral: 18.8 LV SV:         58 LV SV Index:   37 LVOT Area:     2.54 cm  RIGHT VENTRICLE RV Basal diam:  3.25 cm LEFT ATRIUM             Index       RIGHT ATRIUM           Index LA diam:        3.30 cm 2.09 cm/m  RA Area:     12.70 cm LA Vol (A2C):   36.7 ml 23.26 ml/m RA Volume:   28.20 ml  17.87 ml/m LA Vol (A4C):   30.8 ml 19.52 ml/m LA Biplane Vol: 34.0 ml 21.55 ml/m  AORTIC VALVE AV Area (Vmax):    2.07 cm AV Area (Vmean):   1.99  cm AV Area (VTI):     2.04 cm AV Vmax:           130.00 cm/s AV Vmean:          90.100 cm/s AV VTI:            0.285 m AV Peak Grad:      6.8 mmHg AV Mean Grad:      4.0 mmHg LVOT Vmax:         106.00 cm/s LVOT Vmean:        70.500 cm/s LVOT VTI:          0.228 m LVOT/AV VTI ratio: 0.80  AORTA Ao Root diam: 3.00 cm MITRAL VALVE                TRICUSPID VALVE MV Area (PHT): 4.31 cm     TR Peak grad:   52.7 mmHg MV Peak grad:  4.1 mmHg     TR Vmax:        363.00 cm/s MV Mean grad:  2.0 mmHg MV Vmax:       1.01 m/s     SHUNTS MV Vmean:      64.4 cm/s    Systemic VTI:  0.23 m MV Decel Time: 176 msec     Systemic Diam: 1.80 cm MV E velocity: 98.00 cm/s MV A velocity: 102.00 cm/s MV E/A ratio:  0.96 Bartholome Bill MD Electronically signed by Chrissie Noa  Fath MD Signature Date/Time: 12/15/2020/2:00:59 PM    Final      Echo:  Normal RV and LV systolic function with an EF estimated between 60-65% with grade 1 diastolic dysfunction, mild LVH; No evidence of wall motion abnormalities or significant valvular abnormalities.    TELEMETRY: Normal sinus rhythm, rate in the 70s   ASSESSMENT AND PLAN:  Active Problems:   CHF (congestive heart failure) ([place])    1.  Acute HFpEF  -No prior history of HF; echocardiogram this admission revealing preserved LV systolic function with evidence of grade 1 diastolic dysfunction   -On torsemide 20mg  daily at home; will continue torsemide 20mg  BID for now with close monitoring of renal function; creatinine this morning 1.21   -Had a long discussion about the need for low sodium diet, staying under 2000mg  a day   -Discussed importance of daily weights upon discharge   2.  Elevated troponin   -Borderline elevated, but flat; demand ischemia in the setting of new onset CHF   -No clinical evidence of ischemia with no evidence of wall motion abnormalities on echocardiogram; no plan for invasive workup at this time   3.  Acute on chronic COPD exacerbation   -Currently on 5L [place],  baseline is 4L   -Continue nebulizer, steroids, Zithromax   4.  Type 2 diabetes   -SSI   5.  Hypertension   -BP well controlled; continue carvedilol 6.25mg  BID, lisinopril 5mg  daily, and torsemide 20mg  BID   6.  Hyperlipidemia   -Continue atorvastatin 20mg  daily   The history, physical exam findings, and plan of care were all discussed with Dr. Bartholome Bill, and all decision making was made in collaboration.    Avie Arenas  PA-C 12/16/2020 7:30 AM

## 2020-12-16 NOTE — Care Management Important Message (Signed)
Important Message  Patient Details  Name: [name] MRN: [id number] Date of Birth: [birth year]   Medicare Important Message Given:  Yes     Dannette Barbara 12/16/2020, 12:11 PM

## 2020-12-16 NOTE — Progress Notes (Signed)
PROGRESS NOTE    [name]  SKA:[id number] DOB: 08-20-1941 DOA: 12/14/2020 PCP: [name], MD   Brief Narrative:  79 y.o. 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  Patient was in a usual state of health until today when she noticed worsening SOB, increased wheezing and an increase in her O2 requirement.  She rec'd SoluMedrol, Duoneb and was placed on a NRB prior to arrival.  .  Patient denies fevers/chills, weakness, dizziness, chest pain,  N/V/C/D, abdominal pain, dysuria/frequency, changes in mental status.   12/16: Patient still with some dyspnea.  Reports that her home rate of oxygen is 4 L however she is 97% on this rate.  12/17: Dyspnea improving over interval.   Assessment & Plan:   Active Problems:   CHF (congestive heart failure) (HCC)   Protein-calorie malnutrition, severe  Suspected new onset congestive heart failure, unknown type Elevated BNP Mild interstitial edema on CXR Cards on consult Patient reports adherence with home demedex TTE with normal EF and grade 1 diastolic dysfunction Plan: Torsemide 20 mg p.o. twice daily Monitor UOP Daily weights Strict I/O BB, statin, ASA  Acute exacerbation of COPD Acute on chronic hypoxic respiratory failure Patient with 4 L baseline requirement Returned to 4 L at time of my evaluation today I suspect we can titrate down further Goal saturation 88 to 92% Plan: IV Solu-Medrol 40 mg every 12 hours Scheduled duoneb Scheduled brovana Scheduled pulmicort Wean oxygen as tolerated Pulse oximetry  Hyperlipidemia Lipitor  Acute kidney injury Mild Baseline creatinine 0.96 Cautious while on diuretics Avoid nephrotoxins    DVT prophylaxis: Lovenox Code Status: Full Family Communication:  Disposition Plan:Status is: Inpatient  Remains inpatient appropriate  because:Inpatient level of care appropriate due to severity of illness   Dispo: The patient is from: Home              Anticipated d/c is to: Home              Anticipated d/c date is: 2 days              Patient currently is not medically stable to d/c.  Acute on chronic hypoxic respiratory failure improving.  Respiratory status slowly improving.  Patient approaching dry weight.  Anticipate 1-2 additional days of inpatient treatment and monitoring.  Therapy evaluations requested.      Consultants:   Cardiology  Procedures:   None  Antimicrobials:   Azithromycin   Subjective: Patient seen and examined.  No pain complaints.  Dyspnea improving.  Objective: Vitals:   12/16/20 0347 12/16/20 0757 12/16/20 0833 12/16/20 1144  BP: 123/74 (!) 148/71  128/69  Pulse: 81 73  72  Resp: 17     Temp: 98.1 F (36.7 C) 98.4 F (36.9 C)  98.2 F (36.8 C)  TempSrc: Oral Oral  Oral  SpO2: 96% 98% 93% 97%  Weight: 57.2 kg     Height:        Intake/Output Summary (Last 24 hours) at 12/16/2020 1450 Last data filed at 12/16/2020 1424 Gross per 24 hour  Intake 1423.73 ml  Output 1600 ml  Net -176.27 ml   Filed Weights   12/14/20 1104 12/15/20 0958 12/16/20 0347  Weight: 48.1 kg 57.7 kg 57.2 kg    Examination:  General exam: No acute distress.  Appears chronically ill Respiratory system: Diffusely decreased lung sounds.  Bibasilar crackles.  Normal work of breathing.  3 L Cardiovascular system: S1 & S2 heard, RRR. No JVD, murmurs, rubs, gallops or clicks. No pedal edema. Gastrointestinal system: Abdomen is nondistended, soft and nontender. No organomegaly or masses felt. Normal bowel sounds heard. Central nervous system: Alert and oriented. No focal neurological deficits. Extremities: Symmetric 5 x 5 power. Skin: No rashes, lesions or ulcers Psychiatry: Judgement and insight appear normal. Mood & affect appropriate.     Data Reviewed: I have personally reviewed following  labs and imaging studies  CBC: Recent Labs  Lab 12/14/20 1107  WBC 11.9*  HGB 8.8*  HCT 29.2*  MCV 87.7  PLT 765   Basic Metabolic Panel: Recent Labs  Lab 12/14/20 1107 12/15/20 0339 12/16/20 0501  NA 138 141 141  K 4.0 4.1 3.6  CL 104 102 99  CO2 26 29 29   GLUCOSE 156* 162* 159*  BUN 16 18 31*  CREATININE 1.09* 1.22* 1.21*  CALCIUM 10.0 9.5 10.1   GFR: Estimated Creatinine Clearance: 28.8 mL/min (A) (by C-G formula based on SCr of 1.21 mg/dL (H)). Liver Function Tests: Recent Labs  Lab 12/14/20 1107  AST 17  ALT 9  ALKPHOS 67  BILITOT 0.7  PROT 7.1  ALBUMIN 3.3*   No results for input(s): LIPASE, AMYLASE in the last 168 hours. No results for input(s): AMMONIA in the last 168 hours. Coagulation Profile: No results for input(s): INR, PROTIME in the last 168 hours. Cardiac Enzymes: No results for input(s): CKTOTAL, CKMB, CKMBINDEX, TROPONINI in the last 168 hours. BNP (last 3 results) No results for input(s): PROBNP in the last 8760 hours. HbA1C: Recent Labs    12/14/20 1912  HGBA1C 5.3   CBG: Recent Labs  Lab 12/15/20 1156 12/15/20 1703 12/15/20 2022 12/16/20 0758 12/16/20 1145  GLUCAP 163* 166* 149* 169* 205*   Lipid Profile: Recent Labs    12/14/20 1912  CHOL 107  HDL 40*  LDLCALC NOT CALCULATED  TRIG 48  CHOLHDL 2.7   Thyroid Function Tests: Recent Labs    12/14/20 1912  TSH 0.785   Anemia Panel: No results for input(s): VITAMINB12, FOLATE, FERRITIN, TIBC, IRON, RETICCTPCT in the last 72 hours. Sepsis Labs: No results for input(s): PROCALCITON, LATICACIDVEN in the last 168 hours.  Recent Results (from the past 240 hour(s))  Resp Panel by RT-PCR (Flu A&B, Covid) Nasopharyngeal Swab     Status: None   Collection Time: 12/14/20 11:07 AM   Specimen: Nasopharyngeal Swab; Nasopharyngeal(NP) swabs in vial transport medium  Result Value Ref Range Status   SARS Coronavirus 2 by RT PCR NEGATIVE NEGATIVE Final    Comment:  (NOTE) SARS-CoV-2 target nucleic acids are NOT DETECTED.  The SARS-CoV-2 RNA is generally detectable in upper respiratory specimens during the acute phase of infection. The lowest concentration of SARS-CoV-2 viral copies this assay can detect is 138 copies/mL. A negative result does not preclude SARS-Cov-2 infection and should not be used as the sole basis for treatment or other patient management decisions. A negative result may occur with  improper specimen collection/handling, submission of specimen other than nasopharyngeal swab, presence of viral mutation(s) within the areas targeted by this assay, and inadequate number of viral copies(<138 copies/mL). A negative result must be combined with clinical observations, patient history, and epidemiological information. The expected result is Negative.  Fact Sheet for Patients:  EntrepreneurPulse.com.au  Fact Sheet for Healthcare Providers:  IncredibleEmployment.be  This test is no t yet approved or cleared by the [place] and  has been authorized for detection and/or diagnosis of SARS-CoV-2 by FDA under an Emergency Use Authorization (EUA). This EUA will remain  in effect (meaning this test can be used) for the duration of the COVID-19 declaration under Section 564(b)(1) of the Act, 21 U.S.C.section 360bbb-3(b)(1), unless the authorization is terminated  or revoked sooner.       Influenza A by PCR NEGATIVE NEGATIVE Final   Influenza B by PCR NEGATIVE NEGATIVE Final    Comment: (NOTE) The Xpert Xpress SARS-CoV-2/FLU/RSV plus assay is intended as an aid in the diagnosis of influenza from Nasopharyngeal swab specimens and should not be used as a sole basis for treatment. Nasal washings and aspirates are unacceptable for Xpert Xpress SARS-CoV-2/FLU/RSV testing.  Fact Sheet for Patients: EntrepreneurPulse.com.au  Fact Sheet for Healthcare  Providers: IncredibleEmployment.be  This test is not yet approved or cleared by the [place] FDA and has been authorized for detection and/or diagnosis of SARS-CoV-2 by FDA under an Emergency Use Authorization (EUA). This EUA will remain in effect (meaning this test can be used) for the duration of the COVID-19 declaration under Section 564(b)(1) of the Act, 21 U.S.C. section 360bbb-3(b)(1), unless the authorization is terminated or revoked.  Performed at [facility], [address] [place], [place] [address]          Radiology Studies: ECHOCARDIOGRAM COMPLETE  Result Date: 12/15/2020    ECHOCARDIOGRAM REPORT   Patient Name:   [name] [facility] Date of Exam: 12/15/2020 Medical Rec #:  [id number]        Height:       62.0 in Accession #:    [id number]       Weight:       127.3 lb Date of Birth:  [birth year]        BSA:          1.578 m Patient Age:    74 years         BP:           125/63 mmHg Patient Gender: F                HR:           73 bpm. Exam Location:  [facility] Procedure: 2D Echo, Color Doppler and Cardiac Doppler Indications:     I50.9 Congestive Heart Failure  History:         Patient has no prior history of Echocardiogram examinations.                  COPD; Risk Factors:Hypertension and HCL.  Sonographer:     Charmayne Sheer RDCS (AE) Referring Phys:  [id number] Harvie Bridge Diagnosing Phys: Bartholome Bill MD IMPRESSIONS  1. Left ventricular ejection fraction, by estimation, is 60 to 65%. The left ventricle has normal function. The left ventricle has no regional wall motion abnormalities. There is mild left ventricular hypertrophy. Left ventricular diastolic parameters are consistent with Grade I diastolic dysfunction (impaired relaxation).  2. Right ventricular systolic function is normal. The right ventricular size is normal.  3. The mitral valve was not well visualized. Trivial mitral valve regurgitation.  4. The aortic valve is grossly normal. Aortic  valve regurgitation is not visualized. FINDINGS  Left Ventricle: Left ventricular ejection fraction, by estimation, is 60 to 65%. The left ventricle has normal function. The left ventricle has no regional wall motion abnormalities. The left ventricular internal cavity size was normal in size. There is  mild left ventricular hypertrophy. Left ventricular diastolic parameters  are consistent with Grade I diastolic dysfunction (impaired relaxation). Right Ventricle: The right ventricular size is normal. No increase in right ventricular wall thickness. Right ventricular systolic function is normal. Left Atrium: Left atrial size was normal in size. Right Atrium: Right atrial size was normal in size. Pericardium: There is no evidence of pericardial effusion. Mitral Valve: The mitral valve was not well visualized. Trivial mitral valve regurgitation. MV peak gradient, 4.1 mmHg. The mean mitral valve gradient is 2.0 mmHg. Tricuspid Valve: The tricuspid valve is grossly normal. Tricuspid valve regurgitation is trivial. Aortic Valve: The aortic valve is grossly normal. Aortic valve regurgitation is not visualized. Aortic valve mean gradient measures 4.0 mmHg. Aortic valve peak gradient measures 6.8 mmHg. Aortic valve area, by VTI measures 2.04 cm. Pulmonic Valve: The pulmonic valve was not well visualized. Pulmonic valve regurgitation is not visualized. Aorta: The aortic root is normal in size and structure. IAS/Shunts: The atrial septum is grossly normal.  LEFT VENTRICLE PLAX 2D LVIDd:         4.63 cm  Diastology LVIDs:         2.83 cm  LV e' medial:    5.77 cm/s LV PW:         0.94 cm  LV E/e' medial:  17.0 LV IVS:        0.74 cm  LV e' lateral:   5.22 cm/s LVOT diam:     1.80 cm  LV E/e' lateral: 18.8 LV SV:         58 LV SV Index:   37 LVOT Area:     2.54 cm  RIGHT VENTRICLE RV Basal diam:  3.25 cm LEFT ATRIUM             Index       RIGHT ATRIUM           Index LA diam:        3.30 cm 2.09 cm/m  RA Area:     12.70 cm  LA Vol (A2C):   36.7 ml 23.26 ml/m RA Volume:   28.20 ml  17.87 ml/m LA Vol (A4C):   30.8 ml 19.52 ml/m LA Biplane Vol: 34.0 ml 21.55 ml/m  AORTIC VALVE AV Area (Vmax):    2.07 cm AV Area (Vmean):   1.99 cm AV Area (VTI):     2.04 cm AV Vmax:           130.00 cm/s AV Vmean:          90.100 cm/s AV VTI:            0.285 m AV Peak Grad:      6.8 mmHg AV Mean Grad:      4.0 mmHg LVOT Vmax:         106.00 cm/s LVOT Vmean:        70.500 cm/s LVOT VTI:          0.228 m LVOT/AV VTI ratio: 0.80  AORTA Ao Root diam: 3.00 cm MITRAL VALVE                TRICUSPID VALVE MV Area (PHT): 4.31 cm     TR Peak grad:   52.7 mmHg MV Peak grad:  4.1 mmHg     TR Vmax:        363.00 cm/s MV Mean grad:  2.0 mmHg MV Vmax:       1.01 m/s     SHUNTS MV Vmean:      64.4 cm/s  Systemic VTI:  0.23 m MV Decel Time: 176 msec     Systemic Diam: 1.80 cm MV E velocity: 98.00 cm/s MV A velocity: 102.00 cm/s MV E/A ratio:  0.96 Bartholome Bill MD Electronically signed by Bartholome Bill MD Signature Date/Time: 12/15/2020/2:00:59 PM    Final         Scheduled Meds: . arformoterol  15 mcg Nebulization BID  . atorvastatin  20 mg Oral Daily  . budesonide (PULMICORT) nebulizer solution  0.25 mg Nebulization BID  . carvedilol  6.25 mg Oral BID WC  . enoxaparin (LOVENOX) injection  30 mg Subcutaneous Q24H  . feeding supplement  237 mL Oral BID BM  . insulin aspart  0-15 Units Subcutaneous TID WC  . insulin aspart  0-5 Units Subcutaneous QHS  . ipratropium-albuterol  3 mL Nebulization Q6H  . lisinopril  5 mg Oral Daily  . methylPREDNISolone (SOLU-MEDROL) injection  40 mg Intravenous Q8H  . [START ON 12/17/2020] multivitamin with minerals  1 tablet Oral Daily  . potassium chloride  10 mEq Oral Daily  . sodium chloride flush  3 mL Intravenous Q12H  . torsemide  20 mg Oral BID   Continuous Infusions: . sodium chloride Stopped (12/15/20 2250)  . azithromycin Stopped (12/15/20 2205)     LOS: 2 days    Time spent: 25  minutes    Sidney Ace, MD [facility] Pager [phone]  If 7PM-7AM, please contact night-coverage 12/16/2020, 2:50 PM

## 2020-12-16 NOTE — Evaluation (Signed)
Occupational Therapy Evaluation Patient Details Name: [name] MRN: [id number] DOB: [birth year] Today's Date: 12/16/2020    History of Present Illness [name] is an 72yoF who comes to [facility] on 12/15 c SOB. Pt has elevated BNP suspected CHF exacerbation. Pt also in COPD exacerbation. 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   Clinical Impression   Pt seen for OT evaluation this date. Pt was modified independent in all ADL (PRN assist for LB ADL from granddaughter), DME for functional mobility as described below, and living in a 1 story home with her son, daughter in law, 2 granddaughters, and 2 great grandchildren. Pt on 4 liters of O2 at home. Pt reports recently becoming easily fatigued or out of breath with minimal exertion. Pt currently requires MIN assist for LB ADL tasks and CGA for ADL transfers due to current functional impairments (See OT Problem List below).  Pt instructed in falls prevention, energy conservation including pursed lip breathing, activity pacing, "have to" versus "want to" activities and meaningful occupational engagement, AE/DME, and home/routines modifications to maximize safety/indep with ADL and IADL in and outside the home while minimizing SOB/over exertion. Would benefit from handout to support recall and carryover. Therapist provided active listening and emotional support while pt verbalized stress and family situations that she is worried about. Pt reports she has adequate support from family/church for her needsPt verbalized understanding and would benefit from additional skilled OT services to maximize recall and carryover of learned techniques and facilitate implementation of learned techniques into daily routines. Upon discharge, recommend HHOT services to continue addressing energy conservation strategies, falls prevention, and independence in ADL/IADL Tasks in order to minimize falls, SOB, and over exertion requiring increased  caregiver burden. Case mgt notified.       Follow Up Recommendations  Home health OT;Supervision - Intermittent    Equipment Recommendations  None recommended by OT    Recommendations for Other Services       Precautions / Restrictions Precautions Precautions: Fall Restrictions Weight Bearing Restrictions: No      Mobility Bed Mobility Overal bed mobility: Modified Independent                  Transfers Overall transfer level: Needs assistance Equipment used: 1 person hand held assist Transfers: Sit to/from Stand Sit to Stand: Min guard              Balance Overall balance assessment: Mild deficits observed, not formally tested                                         ADL either performed or assessed with clinical judgement   ADL Overall ADL's : Needs assistance/impaired                                       General ADL Comments: Min A for LB ADL Tasks, CGA for ADL transfers     Vision Baseline Vision/History: Wears glasses Wears Glasses: Reading only Patient Visual Report: No change from baseline       Perception     Praxis      Pertinent Vitals/Pain Pain Assessment: Faces Faces Pain Scale: Hurts a little bit Pain Location: pt places her hand on her lower abdomen Pain Descriptors / Indicators: Grimacing;Guarding Pain Intervention(s):  Limited activity within patient's tolerance;Monitored during session;Repositioned     Hand Dominance Right   Extremity/Trunk Assessment Upper Extremity Assessment Upper Extremity Assessment: Generalized weakness   Lower Extremity Assessment Lower Extremity Assessment: Generalized weakness   Cervical / Trunk Assessment Cervical / Trunk Assessment: Normal   Communication Communication Communication: No difficulties   Cognition Arousal/Alertness: Awake/alert Behavior During Therapy: WFL for tasks assessed/performed Overall Cognitive Status: Within Functional Limits for  tasks assessed                                     General Comments       Exercises Other Exercises Other Exercises: Pt instructed in falls prevention, energy conservation including pursed lip breathing, activity pacing, "have to" versus "want to" activities and meaningful occupational engagement, AE/DME, and home/routines modifications to maximize safety/indep with ADL and IADL in and outside the home while minimizing SOB/over exertion. Would benefit from handout to support recall and carryover Other Exercises: Therapist provided active listening and emotional support while pt verbalized stress and family situations that she is worried about. Pt reports she has adequate support from family/church for her needs   Shoulder Instructions      Home Living Family/patient expects to be discharged to:: Private residence Living Arrangements: Children (Son, DIL, 2 adult grandDTRs, 2 baby grands) Available Help at Discharge: Family;Available 24 hours/day Type of Home: Mobile home Home Access: Stairs to enter;Ramped entrance Entrance Stairs-Number of Steps: 4 Entrance Stairs-Rails: Right;Left Home Layout: One level     Bathroom Shower/Tub: Tub/shower unit (doesn't use)   Bathroom Toilet: Standard     Home Equipment: Walker - 2 wheels;Transport chair   Additional Comments: On O2 4L baseline; takes sponge baths at sink, uses depends for intermitten tbowel issues.      Prior Functioning/Environment Level of Independence: Independent with assistive device(s);Needs assistance  Gait / Transfers Assistance Needed: RW in house, transport chair for community access; Rt hip pain (chronic degenerative OA) ADL's / Homemaking Assistance Needed: Pt reports generally independent, PRN assist for LB bathing/dressing/pericare from granddaughter (pericare if she has bowel incontinent episode and is unable to clean up fully herself - wears depends); family provides transportation             OT Problem List: Decreased strength;Decreased activity tolerance;Impaired balance (sitting and/or standing);Decreased knowledge of use of DME or AE      OT Treatment/Interventions: Self-care/ADL training;Therapeutic exercise;Therapeutic activities;Energy conservation;DME and/or AE instruction;Patient/family education;Balance training    OT Goals(Current goals can be found in the care plan section) Acute Rehab OT Goals Patient Stated Goal: get better and go home OT Goal Formulation: With patient Time For Goal Achievement: 12/30/20 Potential to Achieve Goals: Good ADL Goals Pt Will Perform Lower Body Dressing: with modified independence;with adaptive equipment;sit to/from stand Additional ADL Goal #1: Pt will verbalize plan to implement at least 2 learned energy conservation strategies to max safety/indep with ADL/IADL and minimize risk of over exertion and SOB. Additional ADL Goal #2: Pt will perform seated sponge bath with set up and supervision, intermittent STS transfer to perform bathing, O2 sats >90% on 3L O2.  OT Frequency: Min 1X/week   Barriers to D/C:            Co-evaluation              AM-PAC OT "6 Clicks" Daily Activity     Outcome Measure Help from another person eating meals?: None  Help from another person taking care of personal grooming?: None Help from another person toileting, which includes using toliet, bedpan, or urinal?: A Little Help from another person bathing (including washing, rinsing, drying)?: A Little Help from another person to put on and taking off regular upper body clothing?: None Help from another person to put on and taking off regular lower body clothing?: A Little 6 Click Score: 21   End of Session    Activity Tolerance: Patient tolerated treatment well Patient left: in bed;with call bell/phone within reach;with bed alarm set  OT Visit Diagnosis: Other abnormalities of gait and mobility (R26.89);Muscle weakness (generalized) (M62.81)                 Time: 1607-3710 OT Time Calculation (min): 51 min Charges:  OT General Charges $OT Visit: 1 Visit OT Evaluation $OT Eval Moderate Complexity: 1 Mod OT Treatments $Self Care/Home Management : 23-37 mins $Therapeutic Activity: 8-22 mins  Jeni Salles, MPH, MS, OTR/L ascom [phone] 12/16/20, 11:16 AM

## 2020-12-17 LAB — BASIC METABOLIC PANEL
Anion gap: 12 (ref 5–15)
BUN: 43 mg/dL — ABNORMAL HIGH (ref 8–23)
CO2: 34 mmol/L — ABNORMAL HIGH (ref 22–32)
Calcium: 10.1 mg/dL (ref 8.9–10.3)
Chloride: 95 mmol/L — ABNORMAL LOW (ref 98–111)
Creatinine, Ser: 1.24 mg/dL — ABNORMAL HIGH (ref 0.44–1.00)
GFR, Estimated: 44 mL/min — ABNORMAL LOW (ref >60)
Glucose, Bld: 185 mg/dL — ABNORMAL HIGH (ref 70–99)
Potassium: 3.4 mmol/L — ABNORMAL LOW (ref 3.5–5.1)
Sodium: 141 mmol/L (ref 135–145)

## 2020-12-17 LAB — GLUCOSE, CAPILLARY
Glucose-Capillary: 249 mg/dL — ABNORMAL HIGH (ref 70–99)
Glucose-Capillary: 200 mg/dL — ABNORMAL HIGH (ref 70–99)

## 2020-12-17 MED ORDER — PREDNISONE 20 MG PO TABS: 40.0000 mg | ORAL_TABLET | Freq: Every day | ORAL | 0 refills | Status: AC

## 2020-12-17 MED ORDER — LISINOPRIL 5 MG PO TABS: 5.0000 mg | ORAL_TABLET | Freq: Every day | ORAL | 0 refills | Status: DC

## 2020-12-17 MED ORDER — INFLUENZA VAC A&B SA ADJ QUAD 0.5 ML IM PRSY
0.5000 mL | PREFILLED_SYRINGE | Freq: Once | INTRAMUSCULAR | Status: AC
  Administered 2020-12-17: 15:00:00 0.5 mL via INTRAMUSCULAR
  Filled 2020-12-17: qty 0.5

## 2020-12-17 MED ORDER — TORSEMIDE 20 MG PO TABS: 20.0000 mg | ORAL_TABLET | Freq: Every day | ORAL | Status: DC

## 2020-12-17 NOTE — Progress Notes (Signed)
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 Workup in the ED included O2 sats at in the mid 70s on 4L, which improved with nonrebreather, hemoglobin of 8.8, WBC of 11.9, creatinine of 1.09 with a GFR of 51, high sensitivity troponin borderline elevated x 5 at 38, 55, 46, 34, and 29 respectively, BNP of 376.5, and chest xray revealing mild pulmonary vascular congestion with a right pleural effusion; interstitial edema.     Subjective   12/15/20:[name] is lying in bed, in mild respiratory distress. She reports a month long history of worsening dyspnea that progressed over the past 3-4 days. She denies associated lower extremity swelling, orthopnea, or PND. She denies chest pain or chest pressure. She denies heart racing or palpitations. She denies syncopal or presyncopal episodes. She reports no prior cardiac history or evaluation for such. She has been on torsemide 20mg  daily for several years.   12/16/20: [name] is lying in bed, in no acute distress.  She reports breathing is about the same on 5L [place], baseline at home is 4L.  She continues to deny chest pain or chest pressure.  She denies lower extremity swelling, orthopnea, or PND.  Her biggest concern is urinary incontinence.   12/17/20: Patient laying in bed with no distress this morning.  Less short of breath per her report.  Denied chest pain.  Feels like she is at her baseline.  She is interested in going home if possible.  Inpatient Medications    . arformoterol  15 mcg Nebulization BID  . atorvastatin  20 mg Oral  Daily  . budesonide (PULMICORT) nebulizer solution  0.25 mg Nebulization BID  . carvedilol  6.25 mg Oral BID WC  . enoxaparin (LOVENOX) injection  30 mg Subcutaneous Q24H  . feeding supplement  237 mL Oral BID BM  . insulin aspart  0-15 Units Subcutaneous TID WC  . insulin aspart  0-5 Units Subcutaneous QHS  . ipratropium-albuterol  3 mL Nebulization TID  . lisinopril  5 mg Oral Daily  . methylPREDNISolone (SOLU-MEDROL) injection  40 mg Intravenous Q8H  . multivitamin with minerals  1 tablet Oral Daily  . potassium chloride  10 mEq Oral Daily  . sodium chloride flush  3 mL Intravenous Q12H  . [START ON 12/18/2020] torsemide  20 mg Oral Daily    Vital Signs    Vitals:   12/16/20 2012 12/17/20 0336 12/17/20 0738 12/17/20 0800  BP:  119/73  (!) 142/79  Pulse:  66  70  Resp:  17    Temp:  98 F (36.7 C)  98.3 F (36.8 C)  TempSrc:    Oral  SpO2: 96% 94% 98% 98%  Weight:  57.8 kg    Height:        Intake/Output Summary (Last 24 hours) at 12/17/2020 1000 Last data filed at 12/17/2020 0400 Gross per 24 hour  Intake 481.84 ml  Output --  Net 481.84 ml   Filed Weights   12/15/20 0958 12/16/20 0347 12/17/20 0336  Weight: 57.7 kg 57.2 kg 57.8 kg    Physical Exam  GEN: no distress  HEENT: normal.  Neck: Supple, no JVD, carotid bruits, or masses. Cardiac: RRR, no murmurs, rubs, or gallops. No clubbing, cyanosis, edema.  Radials/DP/PT 2+ and equal bilaterally.  Respiratory:  Respirations regular and unlabored, clear to auscultation bilaterally. GI: Soft, nontender, nondistended, BS + x 4. MS: no deformity or atrophy. Skin: warm and dry, no rash. Neuro:  Strength and sensation are intact. Psych: Normal affect.  Labs    CBC Recent Labs    12/14/20 1107  WBC 11.9*  HGB 8.8*  HCT 29.2*  MCV 87.7  PLT 449   Basic Metabolic Panel Recent Labs    12/16/20 0501 12/17/20 0459  NA 141 141  K 3.6 3.4*  CL 99 95*  CO2 29 34*  GLUCOSE 159* 185*  BUN 31* 43*   CREATININE 1.21* 1.24*  CALCIUM 10.1 10.1   Liver Function Tests Recent Labs    12/14/20 1107  AST 17  ALT 9  ALKPHOS 67  BILITOT 0.7  PROT 7.1  ALBUMIN 3.3*   No results for input(s): LIPASE, AMYLASE in the last 72 hours. Cardiac Enzymes No results for input(s): CKTOTAL, CKMB, CKMBINDEX, TROPONINI in the last 72 hours. BNP Recent Labs    12/14/20 1349  BNP 376.5*   D-Dimer No results for input(s): DDIMER in the last 72 hours. Hemoglobin A1C Recent Labs    12/14/20 1912  HGBA1C 5.3   Fasting Lipid Panel Recent Labs    12/14/20 1912  CHOL 107  HDL 40*  LDLCALC NOT CALCULATED  TRIG 48  CHOLHDL 2.7   Thyroid Function Tests Recent Labs    12/14/20 1912  TSH 0.785    Telemetry      ECG    nsr  Radiology    DG Chest Portable 1 View  Result Date: 12/14/2020 CLINICAL DATA:  Shortness of breath EXAM: PORTABLE CHEST 1 VIEW COMPARISON:  October 17, 2018 FINDINGS: There is a right pleural effusion. There is interstitial edema with patchy airspace opacity in the right base. Heart is upper normal in size with apparent mild pulmonary venous hypertension. There is aortic atherosclerosis. No adenopathy. No bone lesions IMPRESSION: Suspect mild pulmonary vascular congestion. Right pleural effusion with interstitial edema raises question of a degree of underlying congestive heart failure. Ill-defined opacity right base likely focus of pneumonia, although edema in this area could present similarly. Aortic Atherosclerosis (ICD10-I70.0). Electronically Signed   By: Lowella Grip III M.D.   On: 12/14/2020 11:33   ECHOCARDIOGRAM COMPLETE  Result Date: 12/15/2020    ECHOCARDIOGRAM REPORT   Patient Name:   [name] [facility] Date of Exam: 12/15/2020 Medical Rec #:  [id number]        Height:       62.0 in Accession #:    [id number]       Weight:       127.3 lb Date of Birth:  [birth year]        BSA:          1.578 m Patient Age:    79 years         BP:           125/63 mmHg  Patient Gender: F                HR:           73 bpm. Exam Location:  [facility] Procedure: 2D Echo, Color Doppler and Cardiac Doppler Indications:     I50.9 Congestive Heart Failure  History:  Patient has no prior history of Echocardiogram examinations.                  COPD; Risk Factors:Hypertension and HCL.  Sonographer:     Charmayne Sheer RDCS (AE) Referring Phys:  [id number] [place] Diagnosing Phys: Bartholome Bill MD IMPRESSIONS  1. Left ventricular ejection fraction, by estimation, is 60 to 65%. The left ventricle has normal function. The left ventricle has no regional wall motion abnormalities. There is mild left ventricular hypertrophy. Left ventricular diastolic parameters are consistent with Grade I diastolic dysfunction (impaired relaxation).  2. Right ventricular systolic function is normal. The right ventricular size is normal.  3. The mitral valve was not well visualized. Trivial mitral valve regurgitation.  4. The aortic valve is grossly normal. Aortic valve regurgitation is not visualized. FINDINGS  Left Ventricle: Left ventricular ejection fraction, by estimation, is 60 to 65%. The left ventricle has normal function. The left ventricle has no regional wall motion abnormalities. The left ventricular internal cavity size was normal in size. There is  mild left ventricular hypertrophy. Left ventricular diastolic parameters are consistent with Grade I diastolic dysfunction (impaired relaxation). Right Ventricle: The right ventricular size is normal. No increase in right ventricular wall thickness. Right ventricular systolic function is normal. Left Atrium: Left atrial size was normal in size. Right Atrium: Right atrial size was normal in size. Pericardium: There is no evidence of pericardial effusion. Mitral Valve: The mitral valve was not well visualized. Trivial mitral valve regurgitation. MV peak gradient, 4.1 mmHg. The mean mitral valve gradient is 2.0 mmHg. Tricuspid Valve: The tricuspid valve  is grossly normal. Tricuspid valve regurgitation is trivial. Aortic Valve: The aortic valve is grossly normal. Aortic valve regurgitation is not visualized. Aortic valve mean gradient measures 4.0 mmHg. Aortic valve peak gradient measures 6.8 mmHg. Aortic valve area, by VTI measures 2.04 cm. Pulmonic Valve: The pulmonic valve was not well visualized. Pulmonic valve regurgitation is not visualized. Aorta: The aortic root is normal in size and structure. IAS/Shunts: The atrial septum is grossly normal.  LEFT VENTRICLE PLAX 2D LVIDd:         4.63 cm  Diastology LVIDs:         2.83 cm  LV e' medial:    5.77 cm/s LV PW:         0.94 cm  LV E/e' medial:  17.0 LV IVS:        0.74 cm  LV e' lateral:   5.22 cm/s LVOT diam:     1.80 cm  LV E/e' lateral: 18.8 LV SV:         58 LV SV Index:   37 LVOT Area:     2.54 cm  RIGHT VENTRICLE RV Basal diam:  3.25 cm LEFT ATRIUM             Index       RIGHT ATRIUM           Index LA diam:        3.30 cm 2.09 cm/m  RA Area:     12.70 cm LA Vol (A2C):   36.7 ml 23.26 ml/m RA Volume:   28.20 ml  17.87 ml/m LA Vol (A4C):   30.8 ml 19.52 ml/m LA Biplane Vol: 34.0 ml 21.55 ml/m  AORTIC VALVE AV Area (Vmax):    2.07 cm AV Area (Vmean):   1.99 cm AV Area (VTI):     2.04 cm AV Vmax:  130.00 cm/s AV Vmean:          90.100 cm/s AV VTI:            0.285 m AV Peak Grad:      6.8 mmHg AV Mean Grad:      4.0 mmHg LVOT Vmax:         106.00 cm/s LVOT Vmean:        70.500 cm/s LVOT VTI:          0.228 m LVOT/AV VTI ratio: 0.80  AORTA Ao Root diam: 3.00 cm MITRAL VALVE                TRICUSPID VALVE MV Area (PHT): 4.31 cm     TR Peak grad:   52.7 mmHg MV Peak grad:  4.1 mmHg     TR Vmax:        363.00 cm/s MV Mean grad:  2.0 mmHg MV Vmax:       1.01 m/s     SHUNTS MV Vmean:      64.4 cm/s    Systemic VTI:  0.23 m MV Decel Time: 176 msec     Systemic Diam: 1.80 cm MV E velocity: 98.00 cm/s MV A velocity: 102.00 cm/s MV E/A ratio:  0.96 Bartholome Bill MD Electronically signed by Bartholome Bill MD Signature Date/Time: 12/15/2020/2:00:59 PM    Final     Assessment & Plan     Active Problems:   CHF (congestive heart failure) ([place])    1.  Acute HFpEF             -No prior history of HF; echocardiogram this admission revealing preserved LV systolic function with evidence of grade 1 diastolic dysfunction              -On torsemide 20mg  daily at home; will continue torsemide 20mg  BID for now with close monitoring of renal function; creatinine this morning 1.21              -Had a long discussion about the need for low sodium diet, staying under 2000mg  a day              -Discussed importance of daily weights upon discharge. She appears to understand i 2.  Elevated troponin              -Borderline elevated, but flat; demand ischemia in the setting of new onset CHF              -No clinical evidence of ischemia with no evidence of wall motion abnormalities on echocardiogram; no plan for invasive workup at this time   3.  Acute on chronic COPD exacerbation              -Currently on 5L [place], baseline is 4L              -Continue nebulizer, steroids, Zithromax   4.  Type 2 diabetes              -SSI   5.  Hypertension              -BP well controlled; continue carvedilol 6.25mg  BID, lisinopril 5mg  daily, and torsemide 20mg  bid although will change to torsemide daily as outpatient. Will see 1 week after discharge and adjust as needed. OK for discharge from cardiac standpoint.   6.  Hyperlipidemia              -Continue atorvastatin 20mg  daily   Signed, Chrissie Noa  AUbaldo Glassing MD 12/17/2020, 10:00 AM  Pager: [phone]

## 2020-12-17 NOTE — TOC Initial Note (Signed)
Transition of Care [facility]) - Initial/Assessment Note    Patient Details  Name: [name] MRN: [id number] Date of Birth: [birth year]  [facility] CM/SW Contact:    Harriet Masson, RN Phone Number:[phone] 12/17/2020, 10:25 AM  Clinical Narrative:                 RN spoke with pt today and offered HHPT for ongoing therapy in the home as suggested. Provider has ordered this therapy however pt opt to decline indicating she is at her baseline with use of her rolling walker. States she has available transportation source for today with no other needs presented.  Will update the ordered provider accordingly.  TOC will continue to follow until discharged.  Expected Discharge Plan: Home/Self Care Barriers to Discharge: No Barriers Identified   Patient Goals and CMS Choice     Choice offered to / list presented to : Patient (Pt declined HHPT. Note PT did not recommend)  Expected Discharge Plan and Services Expected Discharge Plan: Home/Self Care       Living arrangements for the past 2 months: Single Family Home Expected Discharge Date: 12/17/20                                    Prior Living Arrangements/Services Living arrangements for the past 2 months: Single Family Home   Patient language and need for interpreter reviewed:: Yes Do you feel safe going back to the place where you live?: Yes      Need for Family Participation in Patient Care: Yes (Comment) Care giver support system in place?: Yes (comment)   Criminal Activity/Legal Involvement Pertinent to Current Situation/Hospitalization: No - Comment as needed  Activities of Daily Living Home Assistive Devices/Equipment: Gilford Rile (specify type) ADL Screening (condition at time of admission) Patient's cognitive ability adequate to safely complete daily activities?: Yes Is the patient deaf or have difficulty hearing?: No Does the patient have difficulty seeing, even when wearing  glasses/contacts?: No Does the patient have difficulty concentrating, remembering, or making decisions?: No Patient able to express need for assistance with ADLs?: Yes Does the patient have difficulty dressing or bathing?: No Independently performs ADLs?: Yes (appropriate for developmental age) Does the patient have difficulty walking or climbing stairs?: Yes Weakness of Legs: None Weakness of Arms/Hands: None  Permission Sought/Granted                  Emotional Assessment Appearance:: Appears stated age Attitude/Demeanor/Rapport: Engaged Affect (typically observed): Accepting Orientation: : Oriented to Self,Oriented to Place,Oriented to  Time   Psych Involvement: No (comment)  Admission diagnosis:  CHF (congestive heart failure) (HCC) [I50.9] Hypoxia [R09.02] COPD exacerbation (HCC) [J44.1] Congestive heart failure, unspecified HF chronicity, unspecified heart failure type [facility]) [I50.9] Patient Active Problem List   Diagnosis Date Noted  . Protein-calorie malnutrition, severe 12/16/2020  . CHF (congestive heart failure) ([place]) 12/14/2020  . Pain due to onychomycosis of toenails of both feet 06/06/2020  . Onychomycosis 09/20/2016  . Health care maintenance 06/19/2016  . Back pain 05/12/2015  . Fatigue 10/13/2014  . Major depression in remission ([place]) 10/10/2014  . 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 Edema 05/29/2014  . HTN (hypertension), benign 05/29/2014  . Hyperlipidemia associated with type 2 diabetes mellitus ([place]) 05/29/2014  . UI (urinary incontinence) 05/29/2014  . Dyspnea 04/26/2014  . COPD (chronic obstructive pulmonary disease) (  [place]) 03/25/2014  . Chronic respiratory failure ([place]) 03/25/2014   PCP:  [name], MD Pharmacy:   [facility] [phone] [address] [place] [address] Phone: [phone] Fax:  [phone]  [facility]) - [address] [place] [address] Phone: [phone] Fax: [phone]     Social Determinants of Health (SDOH) Interventions    Readmission Risk Interventions No flowsheet data found.

## 2020-12-17 NOTE — Plan of Care (Signed)
Pt ready for discharge IV and tele removed Discharge instructions reviewed with patient and son Time allowed for questions and concerns Verbalizes an understanding on discharge instructions Flu vaccine administered Pt observed for 15 minutes No distress noted.  Discharged with home O2    Problem: Education: Goal: Knowledge of General Education information will improve Description: Including pain rating scale, medication(s)/side effects and non-pharmacologic comfort measures Outcome: Adequate for Discharge   Problem: Health Behavior/Discharge Planning: Goal: Ability to manage health-related needs will improve Outcome: Adequate for Discharge   Problem: Clinical Measurements: Goal: Ability to maintain clinical measurements within normal limits will improve Outcome: Adequate for Discharge Goal: Will remain free from infection Outcome: Adequate for Discharge Goal: Diagnostic test results will improve Outcome: Adequate for Discharge Goal: Respiratory complications will improve Outcome: Adequate for Discharge Goal: Cardiovascular complication will be avoided Outcome: Adequate for Discharge   Problem: Activity: Goal: Risk for activity intolerance will decrease Outcome: Adequate for Discharge   Problem: Nutrition: Goal: Adequate nutrition will be maintained Outcome: Adequate for Discharge   Problem: Coping: Goal: Level of anxiety will decrease Outcome: Adequate for Discharge   Problem: Elimination: Goal: Will not experience complications related to bowel motility Outcome: Adequate for Discharge Goal: Will not experience complications related to urinary retention Outcome: Adequate for Discharge   Problem: Pain Managment: Goal: General experience of comfort will improve Outcome: Adequate for Discharge   Problem: Safety: Goal: Ability to remain free from injury will improve Outcome: Adequate for Discharge   Problem: Skin Integrity: Goal: Risk for impaired skin integrity  will decrease Outcome: Adequate for Discharge   Problem: Education: Goal: Ability to demonstrate management of disease process will improve Outcome: Adequate for Discharge Goal: Ability to verbalize understanding of medication therapies will improve Outcome: Adequate for Discharge Goal: Individualized Educational Video(s) Outcome: Adequate for Discharge   Problem: Activity: Goal: Capacity to carry out activities will improve Outcome: Adequate for Discharge   Problem: Cardiac: Goal: Ability to achieve and maintain adequate cardiopulmonary perfusion will improve Outcome: Adequate for Discharge

## 2020-12-17 NOTE — Discharge Instructions (Signed)
Chronic Obstructive Pulmonary Disease Exacerbation  Chronic obstructive pulmonary disease (COPD) is a long-term (chronic) condition that affects the lungs. COPD is a general term that can be used to describe many different lung problems that cause lung swelling (inflammation) and limit airflow, including chronic bronchitis and emphysema. COPD exacerbations are episodes when breathing symptoms become much worse and require extra treatment. COPD exacerbations are usually caused by infections. Without treatment, COPD exacerbations can be severe and even life threatening. Frequent COPD exacerbations can cause further damage to the lungs. What are the causes? This condition may be caused by:  Respiratory infections, including viral and bacterial infections.  Exposure to smoke.  Exposure to air pollution, chemical fumes, or dust.  Things that give you an allergic reaction (allergens).  Not taking your usual COPD medicines as directed.  Underlying medical problems, such as congestive heart failure or infections not involving the lungs. In many cases, the cause (trigger) of this condition is not known. What increases the risk? The following factors may make you more likely to develop this condition:  Smoking cigarettes.  Old age.  Frequent prior COPD exacerbations. What are the signs or symptoms? Symptoms of this condition include:  Increased coughing.  Increased production of mucus from your lungs (sputum).  Increased wheezing.  Increased shortness of breath.  Rapid or labored breathing.  Chest tightness.  Less energy than usual.  Sleep disruption from symptoms.  Confusion or increased sleepiness. Often these symptoms happen or get worse even with the use of medicines. How is this diagnosed? This condition is diagnosed based on:  Your medical history.  A physical exam. You may also have tests, including:  A chest X-ray.  Blood tests.  Lung (pulmonary) function  tests. How is this treated? Treatment for this condition depends on the severity and cause of the symptoms. You may need to be admitted to a hospital for treatment. Some of the treatments commonly used to treat COPD exacerbations are:  Antibiotic medicines. These may be used for severe exacerbations caused by a lung infection, such as pneumonia.  Bronchodilators. These are inhaled medicines that expand the air passages and allow increased airflow.  Steroid medicines. These act to reduce inflammation in the airways. They may be given with an inhaler, taken by mouth, or given through an IV tube inserted into one of your veins.  Supplemental oxygen therapy.  Airway clearing techniques, such as noninvasive ventilation (NIV) and positive expiratory pressure (PEP). These provide respiratory support through a mask or other noninvasive device. An example of this would be using a continuous positive airway pressure (CPAP) machine to improve delivery of oxygen into your lungs. Follow these instructions at home: Medicines  Take over-the-counter and prescription medicines only as told by your health care provider. It is important to use correct technique with inhaled medicines.  If you were prescribed an antibiotic medicine or oral steroid, take it as told by your health care provider. Do not stop taking the medicine even if you start to feel better. Lifestyle  Eat a healthy diet.  Exercise regularly.  Get plenty of sleep.  Avoid exposure to all substances that irritate the airway, especially to tobacco smoke.  Wash your hands often with soap and water to reduce the risk of infection. If soap and water are not available, use hand sanitizer.  During flu season, avoid enclosed spaces that are crowded with people. General instructions  Drink enough fluid to keep your urine clear or pale yellow (unless you have a medical   condition that requires fluid restriction).  Use a cool mist vaporizer. This  humidifies the air and makes it easier for you to clear your chest when you cough.  If you have a home nebulizer and oxygen, continue to use them as told by your health care provider.  Keep all follow-up visits as told by your health care provider. This is important. How is this prevented?  Stay up-to-date on pneumococcal and influenza (flu) vaccines. A flu shot is recommended every year to help prevent exacerbations.  Do not use any products that contain nicotine or tobacco, such as cigarettes and e-cigarettes. Quitting smoking is very important in preventing COPD from getting worse and in preventing exacerbations from happening as often. If you need help quitting, ask your health care provider.  Follow all instructions for pulmonary rehabilitation after a recent exacerbation. This can help prevent future exacerbations.  Work with your health care provider to develop and follow an action plan. This tells you what steps to take when you experience certain symptoms. Contact a health care provider if:  You have a worsening of your regular COPD symptoms. Get help right away if:  You have worsening shortness of breath, even when resting.  You have trouble talking.  You have severe chest pain.  You cough up blood.  You have a fever.  You have weakness, vomit repeatedly, or faint.  You feel confused.  You are not able to sleep because of your symptoms.  You have trouble doing daily activities. Summary  COPD exacerbations are episodes when breathing symptoms become much worse and require extra treatment above your normal treatment.  Exacerbations can be severe and even life threatening. Frequent COPD exacerbations can cause further damage to your lungs.  COPD exacerbations are usually triggered by infections such as the flu, colds, and even pneumonia.  Treatment for this condition depends on the severity and cause of the symptoms. You may need to be admitted to a hospital for  treatment.  Quitting smoking is very important to prevent COPD from getting worse and to prevent exacerbations from happening as often. This information is not intended to replace advice given to you by your health care provider. Make sure you discuss any questions you have with your health care provider. Document Revised: 11/29/2017 Document Reviewed: 01/21/2017 [employer] Patient Education  2020 [employer]  

## 2020-12-17 NOTE — Discharge Summary (Signed)
Physician Discharge Summary  [name]:[id number] DOB: 1941-05-25 DOA: 12/14/2020  PCP: [name], MD  Admit date: 12/14/2020 Discharge date: 12/17/2020  Admitted From: Home Disposition: Home with home health  Recommendations for Outpatient Follow-up:  1. Follow up with PCP in 1-2 weeks 2. Follow-up with pulmonology Dr. Mortimer Fries as directed 3. 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 Patient was in a usual state of health until today when she noticed worsening SOB, increased wheezing and an increase in her O2 requirement. She rec'd SoluMedrol, Duoneb and was placed on a NRB prior to arrival. .  Patient denies fevers/chills, weakness, dizziness, chest pain, N/V/C/D, abdominal pain, dysuria/frequency, changes in mental status.   12/16: Patient still with some dyspnea.  Reports that her home rate of oxygen is 4 L however she is 97% on this rate.  12/17: Dyspnea improving over interval.  12/18: Respiratory status returned to baseline.  Patient on baseline 3 to 4 L oxygen.  Stable for discharge at this time.  Patient evaluated by cardiology.  No changes in medications from their standpoint.  Patient can resume home diuretic regimen.  Follow-up with cardiology in 1 week post discharge to discuss further medication changes.  Discharge Diagnoses:  Active Problems:   CHF (congestive heart failure) (HCC)   Protein-calorie malnutrition, severe  Acute on chronic diastolic congestive heart failure Elevated BNP Mild interstitial edema on CXR Cards on consult Patient reports adherence with home demedex TTE with  normal EF and grade 1 diastolic dysfunction Heart failure symptoms mild Suspect decompensation is more related to underlying respiratory disease Discharge plan Resume torsemide 20 mg daily BB, statin, ASA Follow-up with cardiology, [facility] in 1 week post discharge.   Case discussed with Dr. Ubaldo Glassing  Acute exacerbation of COPD Acute on chronic hypoxic respiratory failure Patient with 4 L baseline requirement Returned to 4 L at time of my evaluation today I suspect we can titrate down further Goal saturation 88 to 92% DC IV Solu-Medrol Prednisone 40 mg daily x5 days Patient can resume her prior to arrival home inhaler regimen Follow-up with pulmonology Dr. Mortimer Fries as directed  Hyperlipidemia Lipitor  Acute kidney injury Mild Baseline creatinine 0.96 Cautious while on diuretics Avoid nephrotoxins  Discharge Instructions  Discharge Instructions    Diet - low sodium heart healthy   Complete by: As directed    Increase activity slowly   Complete by: As directed      Allergies as of 12/17/2020      Reactions   Mucinex D [pseudoephedrine-guaifenesin Er]    Rash on neck      Medication List    TAKE these medications   albuterol 108 (90 Base) MCG/ACT inhaler Commonly known as: ProAir HFA Inhale 2 puffs into the lungs every 6 (six) hours as needed for wheezing or shortness of breath.   atorvastatin 20 MG tablet Commonly known as: LIPITOR Take 20 mg by mouth daily.   carvedilol 6.25 MG tablet Commonly known as: COREG Take 6.25 mg by mouth 2 (two) times daily with a meal.   Fluticasone-Salmeterol 250-50 MCG/DOSE Aepb Commonly known as: Advair Diskus INHALE 1 PUFF BY MOUTH INTO THE LUNGS TWICE DAILY   lisinopril 5 MG tablet Commonly known as: ZESTRIL  Take 1 tablet (5 mg total) by mouth daily.   mirtazapine 7.5 MG tablet Commonly known as: REMERON Take 7.5 mg by mouth at bedtime.   potassium chloride 10 MEQ CR capsule Commonly known as: MICRO-K Take 10  mEq by mouth daily.   predniSONE 20 MG tablet Commonly known as: Deltasone Take 2 tablets (40 mg total) by mouth daily for 5 days.   Spiriva Respimat 2.5 MCG/ACT Aers Generic drug: Tiotropium Bromide Monohydrate INHALE 2 PUFFS INTO THE LUNGS DAILY   torsemide 20 MG tablet Commonly known as: DEMADEX Take 20 mg by mouth daily.       Follow-up Information    [facility] Follow up on 12/26/2020.   Specialty: Cardiology Why: at 2:00pm. Enter through the [place] entrance Contact information: [place] [phone]       Teodoro Spray, MD Follow up in 1 week(s).   Specialty: Cardiology Contact information: [place] [address] [phone]        Flora Lipps, MD. Schedule an appointment as soon as possible for a visit in 2 week(s).   Specialties: Pulmonary Disease, Cardiology Contact information: [address] [place] [address] [phone]        [name], MD. Schedule an appointment as soon as possible for a visit in 1 week(s).   Specialty: Internal Medicine Contact information: [place] [address] [phone]              Allergies  Allergen Reactions  . Mucinex D [Pseudoephedrine-Guaifenesin Er]     Rash on neck    Consultations:  Cardiology-[facility]   Procedures/Studies: DG Chest Portable 1 View  Result Date: 12/14/2020 CLINICAL DATA:  Shortness of breath EXAM: PORTABLE CHEST 1 VIEW COMPARISON:  October 17, 2018 FINDINGS: There is a right pleural effusion. There is interstitial edema with patchy airspace opacity in the right base. Heart is upper normal in size with apparent mild pulmonary venous hypertension. There is aortic atherosclerosis. No adenopathy. No bone lesions IMPRESSION: Suspect mild pulmonary vascular congestion. Right  pleural effusion with interstitial edema raises question of a degree of underlying congestive heart failure. Ill-defined opacity right base likely focus of pneumonia, although edema in this area could present similarly. Aortic Atherosclerosis (ICD10-I70.0). Electronically Signed   By: Lowella Grip III M.D.   On: 12/14/2020 11:33   ECHOCARDIOGRAM COMPLETE  Result Date: 12/15/2020    ECHOCARDIOGRAM REPORT   Patient Name:   [name] [facility] Date of Exam: 12/15/2020 Medical Rec #:  [id number]        Height:       62.0 in Accession #:    [id number]       Weight:       127.3 lb Date of Birth:  1941/08/15        BSA:          1.578 m Patient Age:    79 years         BP:           125/63 mmHg Patient Gender: F                HR:           73 bpm. Exam Location:  [facility] Procedure: 2D Echo, Color Doppler and Cardiac Doppler Indications:     I50.9 Congestive Heart Failure  History:  Patient has no prior history of Echocardiogram examinations.                  COPD; Risk Factors:Hypertension and HCL.  Sonographer:     Charmayne Sheer RDCS (AE) Referring Phys:  [id number] Har[place] Diagnosing Phys: Bartholome Bill MD IMPRESSIONS  1. Left ventricular ejection fraction, by estimation, is 60 to 65%. The left ventricle has normal function. The left ventricle has no regional wall motion abnormalities. There is mild left ventricular hypertrophy. Left ventricular diastolic parameters are consistent with Grade I diastolic dysfunction (impaired relaxation).  2. Right ventricular systolic function is normal. The right ventricular size is normal.  3. The mitral valve was not well visualized. Trivial mitral valve regurgitation.  4. The aortic valve is grossly normal. Aortic valve regurgitation is not visualized. FINDINGS  Left Ventricle: Left ventricular ejection fraction, by estimation, is 60 to 65%. The left ventricle has normal function. The left ventricle has no regional wall motion abnormalities. The left ventricular  internal cavity size was normal in size. There is  mild left ventricular hypertrophy. Left ventricular diastolic parameters are consistent with Grade I diastolic dysfunction (impaired relaxation). Right Ventricle: The right ventricular size is normal. No increase in right ventricular wall thickness. Right ventricular systolic function is normal. Left Atrium: Left atrial size was normal in size. Right Atrium: Right atrial size was normal in size. Pericardium: There is no evidence of pericardial effusion. Mitral Valve: The mitral valve was not well visualized. Trivial mitral valve regurgitation. MV peak gradient, 4.1 mmHg. The mean mitral valve gradient is 2.0 mmHg. Tricuspid Valve: The tricuspid valve is grossly normal. Tricuspid valve regurgitation is trivial. Aortic Valve: The aortic valve is grossly normal. Aortic valve regurgitation is not visualized. Aortic valve mean gradient measures 4.0 mmHg. Aortic valve peak gradient measures 6.8 mmHg. Aortic valve area, by VTI measures 2.04 cm. Pulmonic Valve: The pulmonic valve was not well visualized. Pulmonic valve regurgitation is not visualized. Aorta: The aortic root is normal in size and structure. IAS/Shunts: The atrial septum is grossly normal.  LEFT VENTRICLE PLAX 2D LVIDd:         4.63 cm  Diastology LVIDs:         2.83 cm  LV e' medial:    5.77 cm/s LV PW:         0.94 cm  LV E/e' medial:  17.0 LV IVS:        0.74 cm  LV e' lateral:   5.22 cm/s LVOT diam:     1.80 cm  LV E/e' lateral: 18.8 LV SV:         58 LV SV Index:   37 LVOT Area:     2.54 cm  RIGHT VENTRICLE RV Basal diam:  3.25 cm LEFT ATRIUM             Index       RIGHT ATRIUM           Index LA diam:        3.30 cm 2.09 cm/m  RA Area:     12.70 cm LA Vol (A2C):   36.7 ml 23.26 ml/m RA Volume:   28.20 ml  17.87 ml/m LA Vol (A4C):   30.8 ml 19.52 ml/m LA Biplane Vol: 34.0 ml 21.55 ml/m  AORTIC VALVE AV Area (Vmax):    2.07 cm AV Area (Vmean):   1.99 cm AV Area (VTI):     2.04 cm AV Vmax:  130.00 cm/s AV Vmean:          90.100 cm/s AV VTI:            0.285 m AV Peak Grad:      6.8 mmHg AV Mean Grad:      4.0 mmHg LVOT Vmax:         106.00 cm/s LVOT Vmean:        70.500 cm/s LVOT VTI:          0.228 m LVOT/AV VTI ratio: 0.80  AORTA Ao Root diam: 3.00 cm MITRAL VALVE                TRICUSPID VALVE MV Area (PHT): 4.31 cm     TR Peak grad:   52.7 mmHg MV Peak grad:  4.1 mmHg     TR Vmax:        363.00 cm/s MV Mean grad:  2.0 mmHg MV Vmax:       1.01 m/s     SHUNTS MV Vmean:      64.4 cm/s    Systemic VTI:  0.23 m MV Decel Time: 176 msec     Systemic Diam: 1.80 cm MV E velocity: 98.00 cm/s MV A velocity: 102.00 cm/s MV E/A ratio:  0.96 Bartholome Bill MD Electronically signed by Bartholome Bill MD Signature Date/Time: 12/15/2020/2:00:59 PM    Final     (Echo, Carotid, EGD, Colonoscopy, ERCP)    Subjective: Seen and examined the day of discharge.  Respiratory status at baseline.  Stable for discharge home.  Discharge Exam: Vitals:   12/17/20 0800 12/17/20 1311  BP: (!) 142/79 (!) 119/57  Pulse: 70 75  Resp:  18  Temp: 98.3 F (36.8 C) 98.9 F (37.2 C)  SpO2: 98% 97%   Vitals:   12/17/20 0336 12/17/20 0738 12/17/20 0800 12/17/20 1311  BP: 119/73  (!) 142/79 (!) 119/57  Pulse: 66  70 75  Resp: 17   18  Temp: 98 F (36.7 C)  98.3 F (36.8 C) 98.9 F (37.2 C)  TempSrc:   Oral Oral  SpO2: 94% 98% 98% 97%  Weight: 57.8 kg     Height:        General: Pt is alert, awake, not in acute distress Cardiovascular: RRR, S1/S2 +, no rubs, no gallops Respiratory: Mild bibasilar crackles.  Lung sounds diffusely decreased bilaterally.  3 L normal work of breathing Abdominal: Soft, NT, ND, bowel sounds + Extremities: no edema, no cyanosis    The results of significant diagnostics from this hospitalization (including imaging, microbiology, ancillary and laboratory) are listed below for reference.     Microbiology: Recent Results (from the past 240 hour(s))  Resp Panel by RT-PCR  (Flu A&B, Covid) Nasopharyngeal Swab     Status: None   Collection Time: 12/14/20 11:07 AM   Specimen: Nasopharyngeal Swab; Nasopharyngeal(NP) swabs in vial transport medium  Result Value Ref Range Status   SARS Coronavirus 2 by RT PCR NEGATIVE NEGATIVE Final    Comment: (NOTE) SARS-CoV-2 target nucleic acids are NOT DETECTED.  The SARS-CoV-2 RNA is generally detectable in upper respiratory specimens during the acute phase of infection. The lowest concentration of SARS-CoV-2 viral copies this assay can detect is 138 copies/mL. A negative result does not preclude SARS-Cov-2 infection and should not be used as the sole basis for treatment or other patient management decisions. A negative result may occur with  improper specimen collection/handling, submission of specimen other than nasopharyngeal swab, presence of viral mutation(s)  within the areas targeted by this assay, and inadequate number of viral copies(<138 copies/mL). A negative result must be combined with clinical observations, patient history, and epidemiological information. The expected result is Negative.  Fact Sheet for Patients:  EntrepreneurPulse.com.au  Fact Sheet for Healthcare Providers:  IncredibleEmployment.be  This test is no t yet approved or cleared by the [place] FDA and  has been authorized for detection and/or diagnosis of SARS-CoV-2 by FDA under an Emergency Use Authorization (EUA). This EUA will remain  in effect (meaning this test can be used) for the duration of the COVID-19 declaration under Section 564(b)(1) of the Act, 21 U.S.C.section 360bbb-3(b)(1), unless the authorization is terminated  or revoked sooner.       Influenza A by PCR NEGATIVE NEGATIVE Final   Influenza B by PCR NEGATIVE NEGATIVE Final    Comment: (NOTE) The Xpert Xpress SARS-CoV-2/FLU/RSV plus assay is intended as an aid in the diagnosis of influenza from Nasopharyngeal swab specimens  and should not be used as a sole basis for treatment. Nasal washings and aspirates are unacceptable for Xpert Xpress SARS-CoV-2/FLU/RSV testing.  Fact Sheet for Patients: EntrepreneurPulse.com.au  Fact Sheet for Healthcare Providers: IncredibleEmployment.be  This test is not yet approved or cleared by the [place] FDA and has been authorized for detection and/or diagnosis of SARS-CoV-2 by FDA under an Emergency Use Authorization (EUA). This EUA will remain in effect (meaning this test can be used) for the duration of the COVID-19 declaration under Section 564(b)(1) of the Act, 21 U.S.C. section 360bbb-3(b)(1), unless the authorization is terminated or revoked.  Performed at [facility], [address] [place], [place] [address]      Labs: BNP (last 3 results) Recent Labs    12/14/20 1349  BNP 440.3*   Basic Metabolic Panel: Recent Labs  Lab 12/14/20 1107 12/15/20 0339 12/16/20 0501 12/17/20 0459  NA 138 141 141 141  K 4.0 4.1 3.6 3.4*  CL 104 102 99 95*  CO2 26 29 29  34*  GLUCOSE 156* 162* 159* 185*  BUN 16 18 31* 43*  CREATININE 1.09* 1.22* 1.21* 1.24*  CALCIUM 10.0 9.5 10.1 10.1   Liver Function Tests: Recent Labs  Lab 12/14/20 1107  AST 17  ALT 9  ALKPHOS 67  BILITOT 0.7  PROT 7.1  ALBUMIN 3.3*   No results for input(s): LIPASE, AMYLASE in the last 168 hours. No results for input(s): AMMONIA in the last 168 hours. CBC: Recent Labs  Lab 12/14/20 1107  WBC 11.9*  HGB 8.8*  HCT 29.2*  MCV 87.7  PLT 219   Cardiac Enzymes: No results for input(s): CKTOTAL, CKMB, CKMBINDEX, TROPONINI in the last 168 hours. BNP: Invalid input(s): POCBNP CBG: Recent Labs  Lab 12/16/20 1145 12/16/20 1718 12/16/20 2155 12/17/20 0852 12/17/20 1206  GLUCAP 205* 163* 145* 249* 200*   D-Dimer No results for input(s): DDIMER in the last 72 hours. Hgb A1c Recent Labs    12/14/20 1912  HGBA1C 5.3   Lipid  Profile Recent Labs    12/14/20 1912  CHOL 107  HDL 40*  LDLCALC NOT CALCULATED  TRIG 48  CHOLHDL 2.7   Thyroid function studies Recent Labs    12/14/20 1912  TSH 0.785   Anemia work up No results for input(s): VITAMINB12, FOLATE, FERRITIN, TIBC, IRON, RETICCTPCT in the last 72 hours. Urinalysis    Component Value Date/Time   COLORURINE STRAW (A) 12/14/2020 1625   APPEARANCEUR CLEAR (A) 12/14/2020 1625   APPEARANCEUR CLEAR 07/02/2013  1144   LABSPEC 1.009 12/14/2020 1625   LABSPEC 1.015 07/02/2013 1144   PHURINE 5.0 12/14/2020 1625   GLUCOSEU NEGATIVE 12/14/2020 1625   GLUCOSEU NEGATIVE 07/02/2013 1144   HGBUR SMALL (A) 12/14/2020 1625   BILIRUBINUR NEGATIVE 12/14/2020 1625   BILIRUBINUR NEGATIVE 07/02/2013 1144   KETONESUR NEGATIVE 12/14/2020 1625   PROTEINUR NEGATIVE 12/14/2020 1625   NITRITE NEGATIVE 12/14/2020 1625   LEUKOCYTESUR NEGATIVE 12/14/2020 1625   LEUKOCYTESUR NEGATIVE 07/02/2013 1144   Sepsis Labs Invalid input(s): PROCALCITONIN,  WBC,  LACTICIDVEN Microbiology Recent Results (from the past 240 hour(s))  Resp Panel by RT-PCR (Flu A&B, Covid) Nasopharyngeal Swab     Status: None   Collection Time: 12/14/20 11:07 AM   Specimen: Nasopharyngeal Swab; Nasopharyngeal(NP) swabs in vial transport medium  Result Value Ref Range Status   SARS Coronavirus 2 by RT PCR NEGATIVE NEGATIVE Final    Comment: (NOTE) SARS-CoV-2 target nucleic acids are NOT DETECTED.  The SARS-CoV-2 RNA is generally detectable in upper respiratory specimens during the acute phase of infection. The lowest concentration of SARS-CoV-2 viral copies this assay can detect is 138 copies/mL. A negative result does not preclude SARS-Cov-2 infection and should not be used as the sole basis for treatment or other patient management decisions. A negative result may occur with  improper specimen collection/handling, submission of specimen other than nasopharyngeal swab, presence of viral  mutation(s) within the areas targeted by this assay, and inadequate number of viral copies(<138 copies/mL). A negative result must be combined with clinical observations, patient history, and epidemiological information. The expected result is Negative.  Fact Sheet for Patients:  EntrepreneurPulse.com.au  Fact Sheet for Healthcare Providers:  IncredibleEmployment.be  This test is no t yet approved or cleared by the [place] FDA and  has been authorized for detection and/or diagnosis of SARS-CoV-2 by FDA under an Emergency Use Authorization (EUA). This EUA will remain  in effect (meaning this test can be used) for the duration of the COVID-19 declaration under Section 564(b)(1) of the Act, 21 U.S.C.section 360bbb-3(b)(1), unless the authorization is terminated  or revoked sooner.       Influenza A by PCR NEGATIVE NEGATIVE Final   Influenza B by PCR NEGATIVE NEGATIVE Final    Comment: (NOTE) The Xpert Xpress SARS-CoV-2/FLU/RSV plus assay is intended as an aid in the diagnosis of influenza from Nasopharyngeal swab specimens and should not be used as a sole basis for treatment. Nasal washings and aspirates are unacceptable for Xpert Xpress SARS-CoV-2/FLU/RSV testing.  Fact Sheet for Patients: EntrepreneurPulse.com.au  Fact Sheet for Healthcare Providers: IncredibleEmployment.be  This test is not yet approved or cleared by the [place] FDA and has been authorized for detection and/or diagnosis of SARS-CoV-2 by FDA under an Emergency Use Authorization (EUA). This EUA will remain in effect (meaning this test can be used) for the duration of the COVID-19 declaration under Section 564(b)(1) of the Act, 21 U.S.C. section 360bbb-3(b)(1), unless the authorization is terminated or revoked.  Performed at [facility], [address] [place], [place] [address]      Time coordinating  discharge: Over 30 minutes  SIGNED:   Sidney Ace, MD  [facility] 12/17/2020, 4:12 PM Pager   If 7PM-7AM, please contact night-coverage

## 2020-12-22 DIAGNOSIS — J42 Unspecified chronic bronchitis: Secondary | ICD-10-CM | POA: Diagnosis not present

## 2020-12-22 DIAGNOSIS — J9610 Chronic respiratory failure, unspecified whether with hypoxia or hypercapnia: Secondary | ICD-10-CM | POA: Diagnosis not present

## 2020-12-22 DIAGNOSIS — Z0000 Encounter for general adult medical examination without abnormal findings: Secondary | ICD-10-CM | POA: Diagnosis not present

## 2020-12-25 NOTE — Progress Notes (Deleted)
   Patient ID: [name], female    DOB: 1941-09-20, 79 y.o.   MRN: [id number]  HPI  [name] is a 79 y/o female with a history of  Echo report from 12/15/20 reviewed and showed an EF of 60-65% along with mild LVH & trivial MR.   Admitted 12/14/20 due to acute on chronic HF. Cardiology consult obtained. Initially placed on IV lasix with transition to oral diuretics. Given IV solu-medrol and then oral prednisone. Able to be weaned back down to oxygen at 3L via [place]. Patient refused home health. Discharged after 3 days.   She presents today for her initial visit with a chief complaint of  Review of Systems    Physical Exam    Assessment & Plan:  1: Chronic heart failure with preserved ejection fraction with structural changes (mild LVH)- - NYHA class - BNP 12/14/20 was 376.5   2: HTN- - BP - saw PCP [name]) 12/22/20 - BMP 12/17/20 reviewed and showed sodium 141, potassium 3.4, creatinine 1.24 and GFR 44  3: DM- - A1c 12/14/20 was 5.3%  4: COPD- - saw pulmonology [name]) 10/31/20

## 2020-12-26 ENCOUNTER — Ambulatory Visit: Payer: PPO | Admitting: Family

## 2020-12-29 ENCOUNTER — Ambulatory Visit: Payer: PPO | Admitting: Family

## 2020-12-29 ENCOUNTER — Telehealth: Payer: Self-pay | Admitting: Family

## 2020-12-29 DIAGNOSIS — J42 Unspecified chronic bronchitis: Secondary | ICD-10-CM | POA: Diagnosis not present

## 2020-12-29 DIAGNOSIS — I5031 Acute diastolic (congestive) heart failure: Secondary | ICD-10-CM | POA: Diagnosis not present

## 2020-12-29 DIAGNOSIS — N1831 Chronic kidney disease, stage 3a: Secondary | ICD-10-CM | POA: Diagnosis not present

## 2020-12-29 DIAGNOSIS — E1169 Type 2 diabetes mellitus with other specified complication: Secondary | ICD-10-CM | POA: Diagnosis not present

## 2020-12-29 DIAGNOSIS — I10 Essential (primary) hypertension: Secondary | ICD-10-CM | POA: Diagnosis not present

## 2020-12-29 DIAGNOSIS — E785 Hyperlipidemia, unspecified: Secondary | ICD-10-CM | POA: Diagnosis not present

## 2020-12-29 DIAGNOSIS — E1122 Type 2 diabetes mellitus with diabetic chronic kidney disease: Secondary | ICD-10-CM | POA: Diagnosis not present

## 2020-12-29 NOTE — Telephone Encounter (Addendum)
After speaking with Dr America Brown office we were asked to r/s a month out. I was unable to reach patient but LVM regarding her rescheduled appt with [place] that was made for 1/27. Asked patient to give [place] a call back to confirm she can make that appointment.    Jamerius Boeckman, NT

## 2021-01-08 DIAGNOSIS — J449 Chronic obstructive pulmonary disease, unspecified: Secondary | ICD-10-CM | POA: Diagnosis not present

## 2021-01-19 ENCOUNTER — Other Ambulatory Visit: Payer: Self-pay

## 2021-01-19 ENCOUNTER — Ambulatory Visit (INDEPENDENT_AMBULATORY_CARE_PROVIDER_SITE_OTHER): Payer: PPO | Admitting: Internal Medicine

## 2021-01-19 ENCOUNTER — Encounter: Payer: Self-pay | Admitting: Internal Medicine

## 2021-01-19 VITALS — BP 100/60 | HR 64 | Temp 97.1°F | Ht 62.0 in | Wt 105.8 lb

## 2021-01-19 DIAGNOSIS — J9611 Chronic respiratory failure with hypoxia: Secondary | ICD-10-CM | POA: Diagnosis not present

## 2021-01-19 DIAGNOSIS — J449 Chronic obstructive pulmonary disease, unspecified: Secondary | ICD-10-CM | POA: Diagnosis not present

## 2021-01-19 DIAGNOSIS — I5032 Chronic diastolic (congestive) heart failure: Secondary | ICD-10-CM

## 2021-01-19 NOTE — Patient Instructions (Addendum)
Continue inhalers as prescribed Continue oxygen as prescribed Breathing exercises with flutter valve 10-15 times per day Follow-up with cardiology as scheduled

## 2021-01-19 NOTE — Progress Notes (Signed)
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 (24/7) Mild obesity   DATA: Spirometry 2014: FEV1 0.78 liters (38% pred)  CT chest 07/06/13: mod - severe emphysema. Chronic RLL scarring CXR 05/12/15: Advanced chronic obstructive pulmonary disease with similar right basilar scarring. No acute findings CXR 04/04/17: No acute findings   INTERVAL HISTORY: Last visit 02/26/19.  No major pulmonary events since that time.   CC Follow-up COPD Follow-up hypoxic respiratory failure  HPI Patient with recent COPD exacerbation in the last month COPD seems to be back at baseline with moderate to severe exertional dyspnea Patient continues to take Advair and Spiriva Patient has chronic cough consistent with chronic bronchitis Ends of infection at this time  No exacerbation at this time No evidence of heart failure at this time No evidence or signs of infection at this time No respiratory distress No fevers, chills, nausea, vomiting, diarrhea No evidence of lower extremity edema No evidence hemoptysis  Patient uses oxygen therapy Patient is not benefits and needs this for survival  Patient with moderate to high risk for pulmonary complications for any type of procedure moving forward   Patient with recent admission for acute diastolic heart failure with pulm edema and CHF exacerbation Patient seems to be back at baseline Patient sees Dr. Ubaldo Glassing with no clinic cardiology  Review of Systems:  Gen:  Denies  fever, sweats, chills weight loss  HEENT: Denies blurred vision, double vision, ear pain, eye pain, hearing loss, nose bleeds, sore throat Cardiac:  No dizziness, chest pain or heaviness, chest tightness,edema, No JVD Resp:   + cough, +sputum production, +shortness of breath,-wheezing, -hemoptysis,  Gi: Denies swallowing difficulty, stomach pain, nausea or vomiting, diarrhea, constipation, bowel incontinence Gu:  Denies bladder incontinence, burning  urine Ext:   Denies Joint pain, stiffness or swelling Skin: Denies  skin rash, easy bruising or bleeding or hives Endoc:  Denies polyuria, polydipsia , polyphagia or weight change Psych:   Denies depression, insomnia or hallucinations  Other:  All other systems negative   BP 100/60 (BP Location: Left Arm, Patient Position: Sitting, Cuff Size: Normal)   Pulse 64   Temp (!) 97.1 F (36.2 C) (Temporal)   Ht 5\' 2"  (1.575 m)   Wt 105 lb 12.8 oz (48 kg)   SpO2 96%   BMI 19.35 kg/m   Physical Examination:   General Appearance: No distress  Neuro:without focal findings,  speech normal,  HEENT: PERRLA, EOM intact.   Pulmonary: normal breath sounds, No wheezing.  CardiovascularNormal S1,S2.  No m/r/g.   Abdomen: Benign, Soft, non-tender. Renal:  No costovertebral tenderness  GU:  Not performed at this time. Endoc: No evident thyromegaly Skin:   warm, no rashes, no ecchymosis  Extremities: normal, no cyanosis, clubbing. PSYCHIATRIC: Mood, affect within normal limits.   ALL OTHER ROS ARE NEGATIVE      Current Outpatient Medications:  .  albuterol (PROAIR HFA) 108 (90 Base) MCG/ACT inhaler, Inhale 2 puffs into the lungs every 6 (six) hours as needed for wheezing or shortness of breath., Disp: 54 g, Rfl: 1 .  atorvastatin (LIPITOR) 20 MG tablet, Take 20 mg by mouth daily., Disp: , Rfl:  .  carvedilol (COREG) 6.25 MG tablet, Take 6.25 mg by mouth 2 (two) times daily with a meal., Disp: , Rfl:  .  Fluticasone-Salmeterol (ADVAIR DISKUS) 250-50 MCG/DOSE AEPB, INHALE 1 PUFF BY MOUTH INTO THE LUNGS TWICE DAILY, Disp: 180 each, Rfl: 3 .  lisinopril (ZESTRIL) 5 MG tablet, Take  1 tablet (5 mg total) by mouth daily., Disp: 30 tablet, Rfl: 0 .  mirtazapine (REMERON) 7.5 MG tablet, Take 7.5 mg by mouth at bedtime., Disp: , Rfl:  .  potassium chloride (MICRO-K) 10 MEQ CR capsule, Take 10 mEq by mouth daily., Disp: , Rfl:  .  Tiotropium Bromide Monohydrate (SPIRIVA RESPIMAT) 2.5 MCG/ACT AERS,  INHALE 2 PUFFS INTO THE LUNGS DAILY, Disp: 12 g, Rfl: 3 .  torsemide (DEMADEX) 20 MG tablet, Take 20 mg by mouth daily., Disp: , Rfl:  No current facility-administered medications for this visit.  Facility-Administered Medications Ordered in Other Visits:  .  albuterol (PROVENTIL) (2.5 MG/3ML) 0.083% nebulizer solution 2.5 mg, 2.5 mg, Nebulization, Once, Wilhelmina Mcardle, MD     IMPRESSION:  80 year old white female with severe end-stage COPD FEV1 30% 6 years ago  Gold stage D with severe respiratory insufficiency with chronic hypoxic respiratory failure COPD with chronic bronchitis and emphysema   Severe end-stage COPD  Gold stage D  Continue inhalers as prescribed  COPD with CHRONIC BRONCHITIS Recommend pulmonary hygiene with flutter valve 10-15 times per day  Chronic hypoxic respiratory failure Patient needs 24/7 oxygen therapy She uses and benefits from this She needs this for survival   diastolic heart failure with pulm edema and CHF exacerbation Follow-up with Dr. Ubaldo Glassing [facility] cardiology    Preop surgical evaluation I have explained to the patient that she is a very high risk for postop and intraoperative pulmonary complications at any time in her life  Preop pulmonary assessment for hip replacement Patient ishigh risk for postop and Intra-Op complications due to her severe COPD Patient is at optimal medical management at this time for her COPD  General risk reduction strategies All patients want postop incentive spirometry Postop flutter valve Early ambulation with PT OT DVT prophylaxis when appropriate Adequate pain control without oversedation   COVID-19 EDUCATION: The signs and symptoms of COVID-19 were discussed with the patient and how to seek care for testing.  The importance of social distancing was discussed today. Hand Washing Techniques and avoid touching face was advised.     MEDICATION ADJUSTMENTS/LABS AND TESTS ORDERED: Continue inhalers as  prescribed Continue oxygen as prescribed  high risk postop and Intra-Op complications Recommend Flutter Valve 10-15 times per day  CURRENT MEDICATIONS REVIEWED AT LENGTH WITH PATIENT TODAY   Patient satisfied with Plan of action and management. All questions answered  Follow up in 6 months  Total time spent 33 mins    Corrin Parker, M.D.  [facility]  Medical Director [place] Director [facility] Cardio-Pulmonary Department

## 2021-01-26 ENCOUNTER — Encounter: Payer: Self-pay | Admitting: Family

## 2021-01-26 ENCOUNTER — Other Ambulatory Visit: Payer: Self-pay

## 2021-01-26 ENCOUNTER — Ambulatory Visit: Payer: PPO | Attending: Family | Admitting: Family

## 2021-01-26 VITALS — BP 91/39 | HR 66 | Resp 16 | Ht 62.0 in | Wt 98.4 lb

## 2021-01-26 DIAGNOSIS — Z9981 Dependence on supplemental oxygen: Secondary | ICD-10-CM | POA: Diagnosis not present

## 2021-01-26 DIAGNOSIS — Z79899 Other long term (current) drug therapy: Secondary | ICD-10-CM | POA: Insufficient documentation

## 2021-01-26 DIAGNOSIS — I10 Essential (primary) hypertension: Secondary | ICD-10-CM

## 2021-01-26 DIAGNOSIS — I5032 Chronic diastolic (congestive) heart failure: Secondary | ICD-10-CM

## 2021-01-26 DIAGNOSIS — Z8249 Family history of ischemic heart disease and other diseases of the circulatory system: Secondary | ICD-10-CM | POA: Insufficient documentation

## 2021-01-26 DIAGNOSIS — E119 Type 2 diabetes mellitus without complications: Secondary | ICD-10-CM | POA: Insufficient documentation

## 2021-01-26 DIAGNOSIS — J449 Chronic obstructive pulmonary disease, unspecified: Secondary | ICD-10-CM | POA: Diagnosis not present

## 2021-01-26 DIAGNOSIS — Z87891 Personal history of nicotine dependence: Secondary | ICD-10-CM | POA: Diagnosis not present

## 2021-01-26 DIAGNOSIS — Z7951 Long term (current) use of inhaled steroids: Secondary | ICD-10-CM | POA: Diagnosis not present

## 2021-01-26 DIAGNOSIS — Z888 Allergy status to other drugs, medicaments and biological substances status: Secondary | ICD-10-CM | POA: Diagnosis not present

## 2021-01-26 DIAGNOSIS — E785 Hyperlipidemia, unspecified: Secondary | ICD-10-CM | POA: Insufficient documentation

## 2021-01-26 DIAGNOSIS — I110 Hypertensive heart disease with heart failure: Secondary | ICD-10-CM | POA: Insufficient documentation

## 2021-01-26 NOTE — Progress Notes (Signed)
Patient ID: Jasmine BIRKEL, female    DOB: [birth year], 80 y.o.   MRN: [id number]  HPI  [name] is a 80 y/o female with a history of DM, hyperlipidemia, HTN, COPD, previous tobacco use and chronic heart failure.   Echo report from 12/15/20 reviewed and showed an EF of 60-65% along with mild LVH.  Admitted 12/14/20 due to shortness of breath. Cardiology consult obtained. Home torsemide dose resumed. Given IV solu-medrol initially with transition to oral prednisone. Oxygen requirement weaned down. Discharged after 3 days.   She presents today for her initial visit with a chief complaint of moderate shortness of breath upon moderate exertion. She describes this as chronic in nature having been present for several years. She has associated fatigue, light-headedness and occasional difficulty sleeping along with this. She denies any abdominal distention, palpitations, pedal edema, chest pain, wheezing, cough or weight gain.   Currently does not check her BP at home but granddaughter that is with her says that she has a cuff at home.   Past Medical History:  Diagnosis Date  . CHF (congestive heart failure) ([place])   . COPD (chronic obstructive pulmonary disease) ([place])   . Diabetes mellitus without complication ([place])   . Environmental allergies   . High blood pressure   . High cholesterol    Past Surgical History:  Procedure Laterality Date  . BREAST LUMPECTOMY Bilateral 1962 (L) 2002(R)  . VESICOVAGINAL FISTULA CLOSURE W/ TAH  1975   Family History  Problem Relation Age of Onset  . Heart disease Father   . Rheum arthritis Mother   . Cancer Mother        breast  . Heart disease Brother        2 brothers  . Cancer Maternal Aunt        cervical  . Cancer Maternal Aunt        stomach   Social History   Tobacco Use  . Smoking status: Former Smoker    Packs/day: 1.00    Years: 45.00    Pack years: 45.00    Types: Cigarettes    Quit date: 07/03/2013    Years since quitting: 7.5   . Smokeless tobacco: Never Used  Substance Use Topics  . Alcohol use: Yes    Alcohol/week: 0.0 standard drinks    Comment: occasional glass of wine   Allergies  Allergen Reactions  . Mucinex D [Pseudoephedrine-Guaifenesin Er]     Rash on neck   Prior to Admission medications   Medication Sig Start Date End Date Taking? Authorizing Provider  acetaminophen (TYLENOL) 500 MG tablet Take 500 mg by mouth every 6 (six) hours as needed. 2 in am and 2 at night   Yes [provider]  albuterol (PROAIR HFA) 108 (90 Base) MCG/ACT inhaler Inhale 2 puffs into the lungs every 6 (six) hours as needed for wheezing or shortness of breath. 07/22/19  Yes Wilhelmina Mcardle, MD  atorvastatin (LIPITOR) 20 MG tablet Take 20 mg by mouth daily.   Yes [provider]  carvedilol (COREG) 6.25 MG tablet Take 6.25 mg by mouth 2 (two) times daily with a meal.   Yes [provider]  Fluticasone-Salmeterol (ADVAIR DISKUS) 250-50 MCG/DOSE AEPB INHALE 1 PUFF BY MOUTH INTO THE LUNGS TWICE DAILY 08/25/20  Yes Kasa, Maretta Bees, MD  lisinopril (ZESTRIL) 5 MG tablet Take 1 tablet (5 mg total) by mouth daily. 12/17/20 01/16/21 Yes Sreenath, Sudheer B, MD  mirtazapine (REMERON) 7.5 MG tablet Take 7.5  mg by mouth at bedtime. 10/24/20  Yes [provider]  potassium chloride (MICRO-K) 10 MEQ CR capsule Take 10 mEq by mouth daily.   Yes [provider]  sertraline (ZOLOFT) 50 MG tablet Take by mouth. 01/11/21 01/11/22 Yes [provider]  Tiotropium Bromide Monohydrate (SPIRIVA RESPIMAT) 2.5 MCG/ACT AERS INHALE 2 PUFFS INTO THE LUNGS DAILY 03/22/20  Yes Kasa, Maretta Bees, MD  torsemide (DEMADEX) 20 MG tablet Take 20 mg by mouth daily.   Yes [provider]    Review of Systems  Constitutional: Positive for fatigue. Negative for appetite change.  HENT: Negative for congestion, postnasal drip and sore throat.   Eyes: Negative.   Respiratory: Positive for shortness of breath. Negative  for cough, chest tightness and wheezing.   Cardiovascular: Negative for chest pain, palpitations and leg swelling.  Gastrointestinal: Negative for abdominal distention and abdominal pain.  Endocrine: Negative.   Genitourinary: Negative.   Musculoskeletal: Negative for back pain and neck pain.  Skin: Negative.   Allergic/Immunologic: Negative.   Neurological: Positive for light-headedness. Negative for dizziness.  Hematological: Negative for adenopathy. Does not bruise/bleed easily.  Psychiatric/Behavioral: Positive for sleep disturbance (sleep patter fluctuates; sleeping on 1 pillow). Negative for dysphoric mood. The patient is not nervous/anxious.    Vitals:   01/26/21 1057  BP: (!) 91/39  Pulse: 66  Resp: 16  SpO2: 98%  Weight: 98 lb 6 oz (44.6 kg)  Height: 5\' 2"  (1.575 m)   Wt Readings from Last 3 Encounters:  01/26/21 98 lb 6 oz (44.6 kg)  01/19/21 105 lb 12.8 oz (48 kg)  12/17/20 127 lb 6.4 oz (57.8 kg)    Lab Results  Component Value Date   CREATININE 1.24 (H) 12/17/2020   CREATININE 1.21 (H) 12/16/2020   CREATININE 1.22 (H) 12/15/2020    Physical Exam Vitals and nursing note reviewed. Exam conducted with a chaperone present (granddaughter).  Constitutional:      Appearance: Normal appearance.  HENT:     Head: Normocephalic and atraumatic.  Cardiovascular:     Rate and Rhythm: Normal rate and regular rhythm.  Pulmonary:     Effort: Pulmonary effort is normal. No respiratory distress.     Breath sounds: No wheezing or rales.  Abdominal:     General: There is no distension.     Palpations: Abdomen is soft.     Tenderness: There is no abdominal tenderness.  Musculoskeletal:        General: No tenderness.     Cervical back: Normal range of motion and neck supple.     Right lower leg: No edema.     Left lower leg: No edema.  Skin:    General: Skin is warm and dry.  Neurological:     General: No focal deficit present.     Mental Status: She is alert and  oriented to person, place, and time.  Psychiatric:        Mood and Affect: Mood normal.        Behavior: Behavior normal.        Thought Content: Thought content normal.    Assessment & Plan:  1: Chronic heart failure with preserved ejection fraction with structural changes (LVH)- - NYHA class II - euvolemic today - weighing daily and she was instructed to call for an overnight weight gain of > 2 pounds or a weekly weight gain of > 5 pounds - not adding salt and the importance of reading food labels was discussed so that she  can follow a low sodium diet - saw cardiology (Fath) 12/29/20 - BNP 12/14/20 was 376.5 - reports receiving 2 covid vaccines and is trying to find a pfizer booster - reports receiving her flu vaccine for this season  2: HTN- - BP low (91/39) and was low upon recheck - will stop daily torsemide and instructed her to take it as needed based on above weight gain parameters or for any swelling in her legs - saw PCP Ouida Sills) 12/22/20 - BMP 12/17/20 reviewed and showed sodium 141, potassium 3.4, creatinine 1.24 and GFR 44  3:COPD- - wearing oxygen at 2-3L around the clock - saw pulmonology (Kasa) 01/19/21   Patient did not bring her medications nor a list. Each medication was verbally reviewed with the patient and she was encouraged to bring the bottles to every visit to confirm accuracy of list.  Return in 1 month or sooner for any questions/problems before then.

## 2021-01-26 NOTE — Patient Instructions (Addendum)
Continue weighing daily and call for an overnight weight gain of > 2 pounds or a weekly weight gain of >5 pounds.   Take your torsemide if you need it based on above weight gain or if you notice swelling in your feet or legs.

## 2021-02-08 DIAGNOSIS — J449 Chronic obstructive pulmonary disease, unspecified: Secondary | ICD-10-CM | POA: Diagnosis not present

## 2021-02-24 ENCOUNTER — Ambulatory Visit: Payer: PPO | Admitting: Family

## 2021-03-08 DIAGNOSIS — J449 Chronic obstructive pulmonary disease, unspecified: Secondary | ICD-10-CM | POA: Diagnosis not present

## 2021-03-20 ENCOUNTER — Other Ambulatory Visit: Payer: Self-pay

## 2021-03-20 ENCOUNTER — Emergency Department: Payer: PPO

## 2021-03-20 ENCOUNTER — Encounter: Payer: Self-pay | Admitting: Emergency Medicine

## 2021-03-20 ENCOUNTER — Inpatient Hospital Stay
Admission: EM | Admit: 2021-03-20 | Discharge: 2021-03-22 | DRG: 377 | Disposition: A | Discharge status: Home / Self Care | Payer: PPO | Attending: Internal Medicine | Admitting: Internal Medicine

## 2021-03-20 DIAGNOSIS — Z20822 Contact with and (suspected) exposure to covid-19: Secondary | ICD-10-CM | POA: Diagnosis present

## 2021-03-20 DIAGNOSIS — F325 Major depressive disorder, single episode, in full remission: Secondary | ICD-10-CM | POA: Diagnosis present

## 2021-03-20 DIAGNOSIS — J449 Chronic obstructive pulmonary disease, unspecified: Secondary | ICD-10-CM | POA: Diagnosis present

## 2021-03-20 DIAGNOSIS — E538 Deficiency of other specified B group vitamins: Secondary | ICD-10-CM | POA: Diagnosis present

## 2021-03-20 DIAGNOSIS — D649 Anemia, unspecified: Secondary | ICD-10-CM | POA: Diagnosis present

## 2021-03-20 DIAGNOSIS — I5032 Chronic diastolic (congestive) heart failure: Secondary | ICD-10-CM | POA: Diagnosis present

## 2021-03-20 DIAGNOSIS — R0902 Hypoxemia: Secondary | ICD-10-CM | POA: Diagnosis not present

## 2021-03-20 DIAGNOSIS — K922 Gastrointestinal hemorrhage, unspecified: Secondary | ICD-10-CM | POA: Diagnosis present

## 2021-03-20 DIAGNOSIS — Z7951 Long term (current) use of inhaled steroids: Secondary | ICD-10-CM | POA: Diagnosis not present

## 2021-03-20 DIAGNOSIS — N1832 Chronic kidney disease, stage 3b: Secondary | ICD-10-CM | POA: Diagnosis present

## 2021-03-20 DIAGNOSIS — E785 Hyperlipidemia, unspecified: Secondary | ICD-10-CM | POA: Diagnosis present

## 2021-03-20 DIAGNOSIS — I509 Heart failure, unspecified: Secondary | ICD-10-CM

## 2021-03-20 DIAGNOSIS — Z888 Allergy status to other drugs, medicaments and biological substances status: Secondary | ICD-10-CM

## 2021-03-20 DIAGNOSIS — Z90710 Acquired absence of both cervix and uterus: Secondary | ICD-10-CM

## 2021-03-20 DIAGNOSIS — J984 Other disorders of lung: Secondary | ICD-10-CM | POA: Diagnosis present

## 2021-03-20 DIAGNOSIS — E039 Hypothyroidism, unspecified: Secondary | ICD-10-CM | POA: Diagnosis present

## 2021-03-20 DIAGNOSIS — Z681 Body mass index (BMI) 19 or less, adult: Secondary | ICD-10-CM

## 2021-03-20 DIAGNOSIS — R0600 Dyspnea, unspecified: Secondary | ICD-10-CM

## 2021-03-20 DIAGNOSIS — J9611 Chronic respiratory failure with hypoxia: Secondary | ICD-10-CM | POA: Diagnosis present

## 2021-03-20 DIAGNOSIS — E871 Hypo-osmolality and hyponatremia: Secondary | ICD-10-CM | POA: Diagnosis present

## 2021-03-20 DIAGNOSIS — Z8249 Family history of ischemic heart disease and other diseases of the circulatory system: Secondary | ICD-10-CM

## 2021-03-20 DIAGNOSIS — Z87891 Personal history of nicotine dependence: Secondary | ICD-10-CM

## 2021-03-20 DIAGNOSIS — E440 Moderate protein-calorie malnutrition: Secondary | ICD-10-CM | POA: Diagnosis present

## 2021-03-20 DIAGNOSIS — J439 Emphysema, unspecified: Secondary | ICD-10-CM | POA: Diagnosis not present

## 2021-03-20 DIAGNOSIS — N179 Acute kidney failure, unspecified: Secondary | ICD-10-CM | POA: Diagnosis present

## 2021-03-20 DIAGNOSIS — E7800 Pure hypercholesterolemia, unspecified: Secondary | ICD-10-CM | POA: Diagnosis present

## 2021-03-20 DIAGNOSIS — Z79899 Other long term (current) drug therapy: Secondary | ICD-10-CM | POA: Diagnosis not present

## 2021-03-20 DIAGNOSIS — I130 Hypertensive heart and chronic kidney disease with heart failure and stage 1 through stage 4 chronic kidney disease, or unspecified chronic kidney disease: Secondary | ICD-10-CM | POA: Diagnosis present

## 2021-03-20 DIAGNOSIS — E1122 Type 2 diabetes mellitus with diabetic chronic kidney disease: Secondary | ICD-10-CM | POA: Diagnosis present

## 2021-03-20 DIAGNOSIS — D62 Acute posthemorrhagic anemia: Secondary | ICD-10-CM | POA: Diagnosis present

## 2021-03-20 DIAGNOSIS — R0602 Shortness of breath: Secondary | ICD-10-CM | POA: Diagnosis present

## 2021-03-20 DIAGNOSIS — N170 Acute kidney failure with tubular necrosis: Secondary | ICD-10-CM | POA: Diagnosis present

## 2021-03-20 DIAGNOSIS — M6281 Muscle weakness (generalized): Secondary | ICD-10-CM | POA: Diagnosis not present

## 2021-03-20 DIAGNOSIS — E860 Dehydration: Secondary | ICD-10-CM | POA: Diagnosis present

## 2021-03-20 DIAGNOSIS — I10 Essential (primary) hypertension: Secondary | ICD-10-CM | POA: Diagnosis present

## 2021-03-20 DIAGNOSIS — N183 Chronic kidney disease, stage 3 unspecified: Secondary | ICD-10-CM | POA: Diagnosis present

## 2021-03-20 DIAGNOSIS — I959 Hypotension, unspecified: Secondary | ICD-10-CM | POA: Diagnosis not present

## 2021-03-20 DIAGNOSIS — Z9981 Dependence on supplemental oxygen: Secondary | ICD-10-CM | POA: Diagnosis not present

## 2021-03-20 DIAGNOSIS — J9610 Chronic respiratory failure, unspecified whether with hypoxia or hypercapnia: Secondary | ICD-10-CM | POA: Diagnosis present

## 2021-03-20 LAB — CBC
HCT: 25.7 % — ABNORMAL LOW (ref 36.0–46.0)
Hemoglobin: 8.3 g/dL — ABNORMAL LOW (ref 12.0–15.0)
MCH: 28.4 pg (ref 26.0–34.0)
MCHC: 32.3 g/dL (ref 30.0–36.0)
MCV: 88 fL (ref 80.0–100.0)
Platelets: 215 10*3/uL (ref 150–400)
RBC: 2.92 MIL/uL — ABNORMAL LOW (ref 3.87–5.11)
RDW: 17.2 % — ABNORMAL HIGH (ref 11.5–15.5)
WBC: 10.3 10*3/uL (ref 4.0–10.5)
nRBC: 0 % (ref 0.0–0.2)

## 2021-03-20 LAB — RETICULOCYTES
Immature Retic Fract: 15.1 % (ref 2.3–15.9)
RBC.: 2.5 MIL/uL — ABNORMAL LOW (ref 3.87–5.11)
Retic Count, Absolute: 43.3 10*3/uL (ref 19.0–186.0)
Retic Ct Pct: 1.7 % (ref 0.4–3.1)

## 2021-03-20 LAB — HEMOGLOBIN A1C
Hgb A1c MFr Bld: 6.3 % — ABNORMAL HIGH (ref 4.8–5.6)
Mean Plasma Glucose: 134.11 mg/dL

## 2021-03-20 LAB — COMPREHENSIVE METABOLIC PANEL
ALT: 10 U/L (ref 0–44)
AST: 11 U/L — ABNORMAL LOW (ref 15–41)
Albumin: 3.7 g/dL (ref 3.5–5.0)
Alkaline Phosphatase: 66 U/L (ref 38–126)
Anion gap: 8 (ref 5–15)
BUN: 84 mg/dL — ABNORMAL HIGH (ref 8–23)
CO2: 18 mmol/L — ABNORMAL LOW (ref 22–32)
Calcium: 10.3 mg/dL (ref 8.9–10.3)
Chloride: 108 mmol/L (ref 98–111)
Creatinine, Ser: 2.24 mg/dL — ABNORMAL HIGH (ref 0.44–1.00)
GFR, Estimated: 21 mL/min — ABNORMAL LOW (ref >60)
Glucose, Bld: 143 mg/dL — ABNORMAL HIGH (ref 70–99)
Potassium: 5 mmol/L (ref 3.5–5.1)
Sodium: 134 mmol/L — ABNORMAL LOW (ref 135–145)
Total Bilirubin: 0.6 mg/dL (ref 0.3–1.2)
Total Protein: 7.7 g/dL (ref 6.5–8.1)

## 2021-03-20 LAB — TROPONIN I (HIGH SENSITIVITY)
Troponin I (High Sensitivity): 20 ng/L — ABNORMAL HIGH (ref <18)
Troponin I (High Sensitivity): 16 ng/L (ref <18)

## 2021-03-20 LAB — RESP PANEL BY RT-PCR (FLU A&B, COVID) ARPGX2
Influenza A by PCR: NEGATIVE
Influenza B by PCR: NEGATIVE
SARS Coronavirus 2 by RT PCR: NEGATIVE

## 2021-03-20 LAB — IRON AND TIBC
Iron: 12 ug/dL — ABNORMAL LOW (ref 28–170)
Saturation Ratios: 5 % — ABNORMAL LOW (ref 10.4–31.8)
TIBC: 235 ug/dL — ABNORMAL LOW (ref 250–450)
UIBC: 223 ug/dL

## 2021-03-20 LAB — PREPARE RBC (CROSSMATCH)

## 2021-03-20 LAB — FOLATE: Folate: 6.4 ng/mL (ref >5.9)

## 2021-03-20 LAB — BRAIN NATRIURETIC PEPTIDE: B Natriuretic Peptide: 222.3 pg/mL — ABNORMAL HIGH (ref 0.0–100.0)

## 2021-03-20 LAB — FERRITIN: Ferritin: 178 ng/mL (ref 11–307)

## 2021-03-20 LAB — ABO/RH: ABO/RH(D): O POS

## 2021-03-20 LAB — VITAMIN B12: Vitamin B-12: 205 pg/mL (ref 180–914)

## 2021-03-20 LAB — CBG MONITORING, ED: Glucose-Capillary: 151 mg/dL — ABNORMAL HIGH (ref 70–99)

## 2021-03-20 MED ORDER — MOMETASONE FURO-FORMOTEROL FUM 200-5 MCG/ACT IN AERO
2.0000 | INHALATION_SPRAY | Freq: Two times a day (BID) | RESPIRATORY_TRACT | Status: DC
  Administered 2021-03-21 – 2021-03-22 (×3): 2 via RESPIRATORY_TRACT
  Filled 2021-03-20 (×2): qty 8.8

## 2021-03-20 MED ORDER — ATORVASTATIN CALCIUM 20 MG PO TABS
20.0000 mg | ORAL_TABLET | Freq: Every day | ORAL | Status: DC
  Administered 2021-03-21 – 2021-03-22 (×2): 20 mg via ORAL
  Filled 2021-03-20 (×2): qty 1

## 2021-03-20 MED ORDER — ONDANSETRON HCL 4 MG/2ML IJ SOLN: 4.0000 mg | Freq: Four times a day (QID) | INTRAMUSCULAR | Status: DC | PRN

## 2021-03-20 MED ORDER — PANTOPRAZOLE SODIUM 40 MG IV SOLR
40.0000 mg | Freq: Once | INTRAVENOUS | Status: AC
  Administered 2021-03-20: 40 mg via INTRAVENOUS
  Filled 2021-03-20 (×2): qty 40

## 2021-03-20 MED ORDER — ALBUTEROL SULFATE HFA 108 (90 BASE) MCG/ACT IN AERS
2.0000 | INHALATION_SPRAY | Freq: Four times a day (QID) | RESPIRATORY_TRACT | Status: DC | PRN
  Filled 2021-03-20: qty 6.7

## 2021-03-20 MED ORDER — ACETAMINOPHEN 500 MG PO TABS
1000.0000 mg | ORAL_TABLET | Freq: Four times a day (QID) | ORAL | Status: DC | PRN
  Administered 2021-03-20 – 2021-03-21 (×3): 1000 mg via ORAL
  Filled 2021-03-20 (×3): qty 2

## 2021-03-20 MED ORDER — SODIUM CHLORIDE 0.9 % IV BOLUS
250.0000 mL | Freq: Once | INTRAVENOUS | Status: AC
  Administered 2021-03-20: 250 mL via INTRAVENOUS

## 2021-03-20 MED ORDER — TIOTROPIUM BROMIDE MONOHYDRATE 18 MCG IN CAPS
18.0000 ug | ORAL_CAPSULE | Freq: Every day | RESPIRATORY_TRACT | Status: DC
  Administered 2021-03-21 – 2021-03-22 (×2): 18 ug via RESPIRATORY_TRACT
  Filled 2021-03-20: qty 5

## 2021-03-20 MED ORDER — SERTRALINE HCL 50 MG PO TABS
50.0000 mg | ORAL_TABLET | Freq: Every day | ORAL | Status: DC
  Administered 2021-03-21 – 2021-03-22 (×2): 50 mg via ORAL
  Filled 2021-03-20 (×2): qty 1

## 2021-03-20 MED ORDER — SODIUM CHLORIDE 0.9% FLUSH: 3.0000 mL | INTRAVENOUS | Status: DC | PRN

## 2021-03-20 MED ORDER — TIOTROPIUM BROMIDE MONOHYDRATE 2.5 MCG/ACT IN AERS: 2.0000 | INHALATION_SPRAY | Freq: Every day | RESPIRATORY_TRACT | Status: DC

## 2021-03-20 MED ORDER — IPRATROPIUM-ALBUTEROL 0.5-2.5 (3) MG/3ML IN SOLN
3.0000 mL | Freq: Once | RESPIRATORY_TRACT | Status: AC
  Administered 2021-03-20: 3 mL via RESPIRATORY_TRACT
  Filled 2021-03-20: qty 3

## 2021-03-20 MED ORDER — SODIUM CHLORIDE 0.9% FLUSH
3.0000 mL | Freq: Two times a day (BID) | INTRAVENOUS | Status: DC
  Administered 2021-03-20 – 2021-03-22 (×5): 3 mL via INTRAVENOUS

## 2021-03-20 MED ORDER — INSULIN ASPART 100 UNIT/ML ~~LOC~~ SOLN: 0.0000 [IU] | SUBCUTANEOUS | Status: DC

## 2021-03-20 MED ORDER — SODIUM CHLORIDE 0.9 % IV SOLN: 250.0000 mL | INTRAVENOUS | Status: DC | PRN

## 2021-03-20 MED ORDER — SODIUM CHLORIDE 0.9 % IV SOLN
10.0000 mL/h | Freq: Once | INTRAVENOUS | Status: AC
  Administered 2021-03-20: 10 mL/h via INTRAVENOUS

## 2021-03-20 MED ORDER — ONDANSETRON HCL 4 MG PO TABS: 4.0000 mg | ORAL_TABLET | Freq: Four times a day (QID) | ORAL | Status: DC | PRN

## 2021-03-20 MED ORDER — SODIUM CHLORIDE 0.9 % IV SOLN: INTRAVENOUS | Status: DC

## 2021-03-20 MED ORDER — MIRTAZAPINE 15 MG PO TABS
7.5000 mg | ORAL_TABLET | Freq: Every day | ORAL | Status: DC
  Administered 2021-03-20 – 2021-03-21 (×2): 7.5 mg via ORAL
  Filled 2021-03-20 (×2): qty 1

## 2021-03-20 NOTE — ED Notes (Addendum)
Attempted to call respiratory 2x regarding VBG sent to lab at this time. Will attempt again shortly.

## 2021-03-20 NOTE — ED Provider Notes (Signed)
[facility] Emergency Department Provider Note    Event Date/Time   First MD Initiated Contact with Patient 03/20/21 1205     (approximate)  I have reviewed the triage vital signs and the nursing notes.   HISTORY  Chief Complaint Shortness of Breath    HPI [name] is a 80 y.o. female with the below listed past medical history presents to the ER for evaluation of worsening shortness of breath over the past 3 days.  She wears 4 L nasal cannula chronically.  Reportedly found by first responders hypoxic to the low 80s on her 4 L.  She is placed on nonrebreather with improvement.  Via EMS she was transition back to her 4 L satting in the mid 32s.  Poorly has not been taking her albuterol at home.  Denies any abdominal pain.    Past Medical History:  Diagnosis Date  . CHF (congestive heart failure) ([place])   . COPD (chronic obstructive pulmonary disease) ([place])   . Diabetes mellitus without complication ([place])   . Environmental allergies   . High blood pressure   . High cholesterol    Family History  Problem Relation Age of Onset  . Heart disease Father   . Rheum arthritis Mother   . Cancer Mother        breast  . Heart disease Brother        2 brothers  . Cancer Maternal Aunt        cervical  . Cancer Maternal Aunt        stomach   Past Surgical History:  Procedure Laterality Date  . BREAST LUMPECTOMY Bilateral 1962 (L) 2002(R)  . VESICOVAGINAL FISTULA CLOSURE W/ TAH  1975   Patient Active Problem List   Diagnosis Date Noted  . Protein-calorie malnutrition, severe 12/16/2020  . CHF (congestive heart failure) ([place]) 12/14/2020  . Pain due to onychomycosis of toenails of both feet 06/06/2020  . Onychomycosis 09/20/2016  . Health care maintenance 06/19/2016  . Back pain 05/12/2015  . Fatigue 10/13/2014  . Major depression in remission ([place]) 10/10/2014  . Type 2 diabetes mellitus with stage 3 chronic kidney disease ([place]) 06/08/2014  .  Edema 05/29/2014  . HTN (hypertension), benign 05/29/2014  . Hyperlipidemia associated with type 2 diabetes mellitus ([place]) 05/29/2014  . UI (urinary incontinence) 05/29/2014  . Dyspnea 04/26/2014  . COPD (chronic obstructive pulmonary disease) ([place]) 03/25/2014  . Chronic respiratory failure ([place]) 03/25/2014      Prior to Admission medications   Medication Sig Start Date End Date Taking? Authorizing Provider  acetaminophen (TYLENOL) 500 MG tablet Take 500 mg by mouth every 6 (six) hours as needed. 2 in am and 2 at night    [provider]  albuterol (PROAIR HFA) 108 (90 Base) MCG/ACT inhaler Inhale 2 puffs into the lungs every 6 (six) hours as needed for wheezing or shortness of breath. 07/22/19   Wilhelmina Mcardle, MD  atorvastatin (LIPITOR) 20 MG tablet Take 20 mg by mouth daily.    [provider]  carvedilol (COREG) 6.25 MG tablet Take 6.25 mg by mouth 2 (two) times daily with a meal.    [provider]  Fluticasone-Salmeterol (ADVAIR DISKUS) 250-50 MCG/DOSE AEPB INHALE 1 PUFF BY MOUTH INTO THE LUNGS TWICE DAILY 08/25/20   Flora Lipps, MD  lisinopril (ZESTRIL) 5 MG tablet Take 1 tablet (5 mg total) by mouth daily. 12/17/20 01/16/21  Sidney Ace, MD  mirtazapine (REMERON) 7.5 MG tablet  Take 7.5 mg by mouth at bedtime. 10/24/20   [provider]  potassium chloride (MICRO-K) 10 MEQ CR capsule Take 10 mEq by mouth daily.    [provider]  sertraline (ZOLOFT) 50 MG tablet Take by mouth. 01/11/21 01/11/22  [provider]  Tiotropium Bromide Monohydrate (SPIRIVA RESPIMAT) 2.5 MCG/ACT AERS INHALE 2 PUFFS INTO THE LUNGS DAILY 03/22/20   Flora Lipps, MD  torsemide (DEMADEX) 20 MG tablet Take 20 mg by mouth daily as needed.    [provider]    Allergies Mucinex d [pseudoephedrine-guaifenesin er]    Social History Social History   Tobacco Use  . Smoking status: Former Smoker    Packs/day: 1.00    Years: 45.00    Pack  years: 45.00    Types: Cigarettes    Quit date: 07/03/2013    Years since quitting: 7.7  . Smokeless tobacco: Never Used  Substance Use Topics  . Alcohol use: Yes    Alcohol/week: 0.0 standard drinks    Comment: occasional glass of wine  . Drug use: No    Review of Systems Patient denies headaches, rhinorrhea, blurry vision, numbness, shortness of breath, chest pain, edema, cough, abdominal pain, nausea, vomiting, diarrhea, dysuria, fevers, rashes or hallucinations unless otherwise stated above in HPI. ____________________________________________   PHYSICAL EXAM:  VITAL SIGNS: Vitals:   03/20/21 1230 03/20/21 1330  BP: (!) 93/52 (!) 82/48  Pulse: 76 79  Resp: 12 19  Temp:    SpO2: 100% 100%    Constitutional: Alert and oriented.   Eyes: Conjunctivae are normal.  Head: Atraumatic. Nose: No congestion/rhinnorhea. Mouth/Throat: Mucous membranes are moist.   Neck: No stridor. Painless ROM.  Cardiovascular: Normal rate, regular rhythm. Grossly normal heart sounds.  Good peripheral circulation. Respiratory: Mild tachypnea with occasional faint expiratory wheeze.  No crackles or rhonchi. Gastrointestinal: Soft and nontender. No distention. No abdominal bruits. No CVA tenderness. Genitourinary: Dark stool in her diaper his guaiac positive on DRE. Musculoskeletal: No lower extremity tenderness nor edema.  No joint effusions. Neurologic:  Normal speech and language. No gross focal neurologic deficits are appreciated. No facial droop Skin:  Skin is warm, dry and intact. No rash noted. Psychiatric: Mood and affect are normal. Speech and behavior are normal.  ____________________________________________   LABS (all labs ordered are listed, but only abnormal results are displayed)  Results for orders placed or performed during the hospital encounter of 03/20/21 (from the past 24 hour(s))  CBC     Status: Abnormal   Collection Time: 03/20/21 12:07 PM  Result Value Ref Range   WBC  10.3 4.0 - 10.5 K/uL   RBC 2.92 (L) 3.87 - 5.11 MIL/uL   Hemoglobin 8.3 (L) 12.0 - 15.0 g/dL   HCT 25.7 (L) 36.0 - 46.0 %   MCV 88.0 80.0 - 100.0 fL   MCH 28.4 26.0 - 34.0 pg   MCHC 32.3 30.0 - 36.0 g/dL   RDW 17.2 (H) 11.5 - 15.5 %   Platelets 215 150 - 400 K/uL   nRBC 0.0 0.0 - 0.2 %  Comprehensive metabolic panel     Status: Abnormal   Collection Time: 03/20/21 12:07 PM  Result Value Ref Range   Sodium 134 (L) 135 - 145 mmol/L   Potassium 5.0 3.5 - 5.1 mmol/L   Chloride 108 98 - 111 mmol/L   CO2 18 (L) 22 - 32 mmol/L   Glucose, Bld 143 (H) 70 - 99 mg/dL   BUN 84 (H) 8 -  23 mg/dL   Creatinine, Ser 2.24 (H) 0.44 - 1.00 mg/dL   Calcium 10.3 8.9 - 10.3 mg/dL   Total Protein 7.7 6.5 - 8.1 g/dL   Albumin 3.7 3.5 - 5.0 g/dL   AST 11 (L) 15 - 41 U/L   ALT 10 0 - 44 U/L   Alkaline Phosphatase 66 38 - 126 U/L   Total Bilirubin 0.6 0.3 - 1.2 mg/dL   GFR, Estimated 21 (L) >60 mL/min   Anion gap 8 5 - 15  Brain natriuretic peptide     Status: Abnormal   Collection Time: 03/20/21 12:07 PM  Result Value Ref Range   B Natriuretic Peptide 222.3 (H) 0.0 - 100.0 pg/mL  Blood gas, venous     Status: Abnormal (Preliminary result)   Collection Time: 03/20/21 12:27 PM  Result Value Ref Range   pH, Ven 7.26 7.250 - 7.430   pCO2, Ven 40 (L) 44.0 - 60.0 mmHg   pO2, Ven PENDING 32.0 - 45.0 mmHg   Bicarbonate 17.9 (L) 20.0 - 28.0 mmol/L   Acid-base deficit 8.6 (H) 0.0 - 2.0 mmol/L   O2 Saturation 19.4 %   Patient temperature 37.0    Collection site VEIN    Sample type VEIN   Troponin I (High Sensitivity)     Status: Abnormal   Collection Time: 03/20/21 12:34 PM  Result Value Ref Range   Troponin I (High Sensitivity) 20 (H) <18 ng/L   ____________________________________________  EKG My review and personal interpretation at Time: 12:05   Indication: sob  Rate: 75  Rhythm: sinus Axis: normal Other: normal intervals, no stemi ____________________________________________  RADIOLOGY  I  personally reviewed all radiographic images ordered to evaluate for the above acute complaints and reviewed radiology reports and findings.  These findings were personally discussed with the patient.  Please see medical record for radiology report.  ____________________________________________   PROCEDURES  Procedure(s) performed:  .Critical Care Performed by: Merlyn Lot, MD Authorized by: Merlyn Lot, MD   Critical care provider statement:    Critical care time (minutes):  15   Critical care time was exclusive of:  Separately billable procedures and treating other patients   Critical care was necessary to treat or prevent imminent or life-threatening deterioration of the following conditions:  Circulatory failure   Critical care was time spent personally by me on the following activities:  Development of treatment plan with patient or surrogate, discussions with consultants, evaluation of patient's response to treatment, examination of patient, obtaining history from patient or surrogate, ordering and performing treatments and interventions, ordering and review of laboratory studies, ordering and review of radiographic studies, pulse oximetry, re-evaluation of patient's condition and review of old charts      Critical Care performed: yes ____________________________________________   INITIAL IMPRESSION / [place] / ED COURSE  Pertinent labs & imaging results that were available during my care of the patient were reviewed by me and considered in my medical decision making (see chart for details).   DDX: Asthma, copd, CHF, pna, ptx, malignancy, Pe, anemia   ARNOLA CRITTENDON is a 80 y.o. who presents to the ED with presentation as described above.  Patient chronically ill-appearing frail reportedly hypoxic via EMS but with normal O2 saturations on her home oxygen.  P sent for the but differential.  I do not hear any significant COPD or significant wheezing but  will give nebulizers and reassess.  Will check blood work for the but differential.  Clinical Course as  of 03/20/21 1441  Mon Mar 20, 2021  1430 Patient did not feel much significant change with nebulizer.  Blood pressures are soft.  Further review her hemoglobin has been downtrending over the past several weeks also has significant elevated BUN mild AKI.  She denies any melena but does have guaiac positive stool.  Suspect she is having slow GI blood losses and is now becoming more symptomatic from this I do believe that she would benefit from hospitalization for blood transfusion.  I will give Protonix.  Her abdominal exam otherwise soft and benign.  Have discussed with the patient and available family all diagnostics and treatments performed thus far and all questions were answered to the best of my ability. The patient demonstrates understanding and agreement with plan.  [PR]    Clinical Course User Index [PR] Merlyn Lot, MD    The patient was evaluated in Emergency Department today for the symptoms described in the history of present illness. He/she was evaluated in the context of the global COVID-19 pandemic, which necessitated consideration that the patient might be at risk for infection with the SARS-CoV-2 virus that causes COVID-19. Institutional protocols and algorithms that pertain to the evaluation of patients at risk for COVID-19 are in a state of rapid change based on information released by regulatory bodies including the CDC and federal and state organizations. These policies and algorithms were followed during the patient's care in the ED.  As part of my medical decision making, I reviewed the following data within the [place] notes reviewed and incorporated, Labs reviewed, notes from prior ED visits and [place] Controlled Substance Database   ____________________________________________   FINAL CLINICAL IMPRESSION(S) / ED DIAGNOSES  Final diagnoses:   Dyspnea, unspecified type  Symptomatic anemia      NEW MEDICATIONS STARTED DURING THIS VISIT:  New Prescriptions   No medications on file     Note:  This document was prepared using Dragon voice recognition software and may include unintentional dictation errors.    Merlyn Lot, MD 03/20/21 [id number]

## 2021-03-20 NOTE — ED Triage Notes (Signed)
Pt arrived via [facility] from with home with c/o SOB x 3 days, getting worse today. Pt on 4L [place] chronically.  Fire on scene noted pt sat in low 80s on 4L, Per EMS, fire placed pt on NRB at 10L. On EMS arrival, pt sat maintaining in high 90s, pt put back on 4L via [place].   Per EMS, family states pt not taking albuterol at home as prescribed.   Pt A&Ox4, able to answer all questions appropriately at this time.   Pt has hx hypotension.

## 2021-03-20 NOTE — ED Notes (Signed)
Blood transfusing at 125/hr

## 2021-03-20 NOTE — ED Notes (Addendum)
Attempted to call RT again regarding VBG. No answer, will attempt to call again. MD Quentin Cornwall made aware of delay at this time.

## 2021-03-20 NOTE — H&P (Signed)
History and Physical    [name] HQP:[id number] DOB: 02/17/1941 DOA: 03/20/2021  PCP: [name], MD   Patient coming from: Home  I have personally briefly reviewed patient's old medical records in [place]  Chief Complaint: Shortness of breath  HPI: [name] is a 80 y.o. female with medical history significant for COPD with chronic respiratory failure on 4 L of oxygen continuous, chronic diastolic dysfunction CHF, diabetes mellitus with stage III chronic kidney disease and hypertension who presents to the ER for evaluation of worsening shortness of breath from her baseline.  Patient has a history of COPD and states that at rest she is fine but with any form of exertion she is unable to catch her breath and has to sit down or rest to relieve her symptoms. When EMS arrived patient was said to have pulse oximetry in the low 80s on 4 L and she was placed on a non rebreather mask at 10 L and transported to the ER. She denies having any hematochezia, hematemesis and passage of melena stools.  She denies NSAID use or having any abdominal pain.  She describes her stools as rusty brown in color.  She claims to have lost a significant amount of weight, over 50 pounds in the last 3 years.  She has not had a recent colonoscopy. She denies feeling dizzy or lightheaded and has not had any falls recently.  She denies having any palpitations, no diaphoresis, no nausea, no vomiting, no urinary frequency, no dysuria, no nocturia, no headache, no blurred vision, no focal deficit, no fever, no chills, no cough Venous blood gas 7.26/40/17.9/19.4 Sodium 134, potassium 5.0, chloride 108, bicarb 18, glucose 143, BUN 84 compared to baseline of 42, creatinine 2.24 compared to baseline of 1.24, calcium 10.3, alkaline phosphatase 66, albumin 3.7, AST 11, ALT 10, total protein 7.7, BNP 222, troponin XX, white count 10.3, hemoglobin 8.3 compared to baseline of 12, hematocrit 25.7, MCV 88, RDW  17.2, platelet count 215 Chest x-ray reviewed by me shows hyperinflated lung fields.  Trace right pleural effusion. Twelve-lead EKG reviewed by me shows sinus rhythm.   ED Course: Patient is an 80 year old Caucasian female who was brought into the ER by EMS for evaluation of worsening shortness of breath from her baseline.  Patient complains of exertional shortness of breath.  She is noted to have a drop in her hemoglobin to 8.3g/dl from a baseline of 12 g/dl and she also has worsening renal function, her baseline serum creatinine is 1.24 today on admission it is 2.24.  She also has relative hypotension with systolic blood pressure in the 90s.  She will be referred to observation status for further evaluation.  Review of Systems: As per HPI otherwise all other systems reviewed and negative.    Past Medical History:  Diagnosis Date  . CHF (congestive heart failure) ([place])   . COPD (chronic obstructive pulmonary disease) ([place])   . Diabetes mellitus without complication ([place])   . Environmental allergies   . High blood pressure   . High cholesterol     Past Surgical History:  Procedure Laterality Date  . BREAST LUMPECTOMY Bilateral 1962 (L) 2002(R)  . VESICOVAGINAL FISTULA CLOSURE W/ TAH  1975     reports that she quit smoking about 7 years ago. Her smoking use included cigarettes. She has a 45.00 pack-year smoking history. She has never used smokeless tobacco. She reports current alcohol use. She reports that she does not use drugs.  Allergies  Allergen Reactions  . Mucinex D [Pseudoephedrine-Guaifenesin Er]     Rash on neck    Family History  Problem Relation Age of Onset  . Heart disease Father   . Rheum arthritis Mother   . Cancer Mother        breast  . Heart disease Brother        2 brothers  . Cancer Maternal Aunt        cervical  . Cancer Maternal Aunt        stomach      Prior to Admission medications   Medication Sig Start Date End Date Taking? Authorizing  Provider  acetaminophen (TYLENOL) 500 MG tablet Take 500 mg by mouth every 6 (six) hours as needed. 2 in am and 2 at night    [provider]  albuterol (PROAIR HFA) 108 (90 Base) MCG/ACT inhaler Inhale 2 puffs into the lungs every 6 (six) hours as needed for wheezing or shortness of breath. 07/22/19   Wilhelmina Mcardle, MD  atorvastatin (LIPITOR) 20 MG tablet Take 20 mg by mouth daily.    [provider]  carvedilol (COREG) 6.25 MG tablet Take 6.25 mg by mouth 2 (two) times daily with a meal.    [provider]  Fluticasone-Salmeterol (ADVAIR DISKUS) 250-50 MCG/DOSE AEPB INHALE 1 PUFF BY MOUTH INTO THE LUNGS TWICE DAILY 08/25/20   Flora Lipps, MD  lisinopril (ZESTRIL) 5 MG tablet Take 1 tablet (5 mg total) by mouth daily. 12/17/20 01/16/21  Sidney Ace, MD  mirtazapine (REMERON) 7.5 MG tablet Take 7.5 mg by mouth at bedtime. 10/24/20   [provider]  potassium chloride (MICRO-K) 10 MEQ CR capsule Take 10 mEq by mouth daily.    [provider]  sertraline (ZOLOFT) 50 MG tablet Take by mouth. 01/11/21 01/11/22  [provider]  Tiotropium Bromide Monohydrate (SPIRIVA RESPIMAT) 2.5 MCG/ACT AERS INHALE 2 PUFFS INTO THE LUNGS DAILY 03/22/20   Flora Lipps, MD  torsemide (DEMADEX) 20 MG tablet Take 20 mg by mouth daily as needed.    [provider]    Physical Exam: Vitals:   03/20/21 1215 03/20/21 1216 03/20/21 1230 03/20/21 1330  BP: (!) 95/50  (!) 93/52 (!) 82/48  Pulse: 74  76 79  Resp: 19  12 19   Temp: 97.9 F (36.6 C)     TempSrc: Oral     SpO2: 100%  100% 100%  Weight:  44.9 kg    Height:  5\' 2"  (1.575 m)       Vitals:   03/20/21 1215 03/20/21 1216 03/20/21 1230 03/20/21 1330  BP: (!) 95/50  (!) 93/52 (!) 82/48  Pulse: 74  76 79  Resp: 19  12 19   Temp: 97.9 F (36.6 C)     TempSrc: Oral     SpO2: 100%  100% 100%  Weight:  44.9 kg    Height:  5\' 2"  (1.575 m)        Constitutional: Alert and oriented x 3.  Not in any apparent distress.  Chronically ill-appearing HEENT:      Head: Normocephalic and atraumatic.         Eyes: PERLA, EOMI, Conjunctivae are pallor. Sclera is non-icteric.       Mouth/Throat: Mucous membranes are dry.       Neck: Supple with no signs of meningismus. Cardiovascular: Regular rate and rhythm. No murmurs, gallops, or rubs. 2+ symmetrical distal pulses are present . No JVD. No LE edema  Respiratory: Respiratory effort normal .Lungs sounds clear bilaterally.  Faint wheezes, crackles, or rhonchi.  Gastrointestinal: Soft, non tender, and non distended with positive bowel sounds.  Genitourinary: No CVA tenderness. Musculoskeletal: Nontender with normal range of motion in all extremities. No cyanosis, or erythema of extremities. Neurologic:  Face is symmetric. Moving all extremities. No gross focal neurologic deficits  Skin: Skin is warm, dry.  No rash or ulcers Psychiatric: Mood and affect are normal   Labs on Admission: I have personally reviewed following labs and imaging studies  CBC: Recent Labs  Lab 03/20/21 1207  WBC 10.3  HGB 8.3*  HCT 25.7*  MCV 88.0  PLT 277   Basic Metabolic Panel: Recent Labs  Lab 03/20/21 1207  NA 134*  K 5.0  CL 108  CO2 18*  GLUCOSE 143*  BUN 84*  CREATININE 2.24*  CALCIUM 10.3   GFR: Estimated Creatinine Clearance: 13.7 mL/min (A) (by C-G formula based on SCr of 2.24 mg/dL (H)). Liver Function Tests: Recent Labs  Lab 03/20/21 1207  AST 11*  ALT 10  ALKPHOS 66  BILITOT 0.6  PROT 7.7  ALBUMIN 3.7   No results for input(s): LIPASE, AMYLASE in the last 168 hours. No results for input(s): AMMONIA in the last 168 hours. Coagulation Profile: No results for input(s): INR, PROTIME in the last 168 hours. Cardiac Enzymes: No results for input(s): CKTOTAL, CKMB, CKMBINDEX, TROPONINI in the last 168 hours. BNP (last 3 results) No results for input(s): PROBNP in the last 8760 hours. HbA1C: No results for input(s): HGBA1C  in the last 72 hours. CBG: No results for input(s): GLUCAP in the last 168 hours. Lipid Profile: No results for input(s): CHOL, HDL, LDLCALC, TRIG, CHOLHDL, LDLDIRECT in the last 72 hours. Thyroid Function Tests: No results for input(s): TSH, T4TOTAL, FREET4, T3FREE, THYROIDAB in the last 72 hours. Anemia Panel: No results for input(s): VITAMINB12, FOLATE, FERRITIN, TIBC, IRON, RETICCTPCT in the last 72 hours. Urine analysis:    Component Value Date/Time   COLORURINE STRAW (A) 12/14/2020 1625   APPEARANCEUR CLEAR (A) 12/14/2020 1625   APPEARANCEUR CLEAR 07/02/2013 1144   LABSPEC 1.009 12/14/2020 1625   LABSPEC 1.015 07/02/2013 1144   PHURINE 5.0 12/14/2020 1625   GLUCOSEU NEGATIVE 12/14/2020 1625   GLUCOSEU NEGATIVE 07/02/2013 1144   HGBUR SMALL (A) 12/14/2020 1625   BILIRUBINUR NEGATIVE 12/14/2020 1625   BILIRUBINUR NEGATIVE 07/02/2013 1144   KETONESUR NEGATIVE 12/14/2020 1625   PROTEINUR NEGATIVE 12/14/2020 1625   NITRITE NEGATIVE 12/14/2020 1625   LEUKOCYTESUR NEGATIVE 12/14/2020 1625   LEUKOCYTESUR NEGATIVE 07/02/2013 1144    Radiological Exams on Admission: DG Chest Portable 1 View  Result Date: 03/20/2021 CLINICAL DATA:  Worsening shortness of breath over the past 3 days. EXAM: PORTABLE CHEST 1 VIEW COMPARISON:  Chest x-ray dated December 14, 2020. FINDINGS: The heart size and mediastinal contours are within normal limits. Normal pulmonary vascularity. The lungs remain hyperinflated with emphysematous changes and chronically coarsened interstitial markings. Mild right basilar atelectasis/scarring. Possible trace right pleural effusion. No focal consolidation or pneumothorax. No acute osseous abnormality. IMPRESSION: 1. Possible trace right pleural effusion. 2. COPD. Electronically Signed   By: Titus Dubin M.D.   On: 03/20/2021 13:02     Assessment/Plan Principal Problem:   Symptomatic anemia Active Problems:   COPD (chronic obstructive pulmonary disease) (HCC)    Chronic respiratory failure (HCC)   HTN (hypertension), benign   Major depression in remission ([place])   Type 2 diabetes mellitus with stage 3 chronic kidney  disease ([place])   CHF (congestive heart failure) (H[facility])     Symptomatic anemia Unclear etiology may be related to chronic kidney disease Patient has had a steady decline in her H&H from a baseline of 12g/dl > > 8.8g/dl >> 8.3g/dl Obtain serum iron studies We will transfuse 1 unit of packed RBC Patient will benefit from referral to gastroenterology as an outpatient    Acute kidney injury Most likely secondary to overdiuresis and ATN from hypotension At baseline patient has a serum creatinine of 1.24 and today on admission it is 2.24 She has relative hypotension with systolic blood pressure in the 90s Hold lisinopril, torsemide and carvedilol IV fluid hydration Repeat renal parameters in a.m.    COPD with chronic respiratory failure Stable and not acutely exacerbated Continue as needed bronchodilator therapy Continue inhaled steroids and Spiriva   Depression Continue sertraline and Remeron    History of diastolic dysfunction CHF Stable and not acutely exacerbated Hold torsemide and lisinopril due to acute kidney injury Hold carvedilol due to relative hypotension  DVT prophylaxis: SCD Code Status: full code Family Communication: Greater than 50% of time spent discussing plan of care with patient at the bedside.  All questions and concerns have been addressed.  She verbalizes understanding and agrees with the plan. Disposition Plan: Back to previous home environment Consults called: none Status: Observation    Yaniris Braddock MD [facility]     03/20/2021, 2:52 PM

## 2021-03-20 NOTE — ED Notes (Signed)
Called lab to add BNP on to previously sent bloodwork. Per Elmyra Ricks in lab, will add on.

## 2021-03-20 NOTE — ED Notes (Signed)
Attempted to call RT a 4th time r/t previously sent VBG. No answer at this time, will attempt to call again.

## 2021-03-21 DIAGNOSIS — D649 Anemia, unspecified: Secondary | ICD-10-CM | POA: Diagnosis present

## 2021-03-21 DIAGNOSIS — I5032 Chronic diastolic (congestive) heart failure: Secondary | ICD-10-CM | POA: Diagnosis present

## 2021-03-21 DIAGNOSIS — E538 Deficiency of other specified B group vitamins: Secondary | ICD-10-CM | POA: Diagnosis present

## 2021-03-21 DIAGNOSIS — E7800 Pure hypercholesterolemia, unspecified: Secondary | ICD-10-CM | POA: Diagnosis present

## 2021-03-21 DIAGNOSIS — E785 Hyperlipidemia, unspecified: Secondary | ICD-10-CM | POA: Diagnosis present

## 2021-03-21 DIAGNOSIS — Z7951 Long term (current) use of inhaled steroids: Secondary | ICD-10-CM | POA: Diagnosis not present

## 2021-03-21 DIAGNOSIS — E1122 Type 2 diabetes mellitus with diabetic chronic kidney disease: Secondary | ICD-10-CM | POA: Diagnosis present

## 2021-03-21 DIAGNOSIS — Z8249 Family history of ischemic heart disease and other diseases of the circulatory system: Secondary | ICD-10-CM | POA: Diagnosis not present

## 2021-03-21 DIAGNOSIS — E871 Hypo-osmolality and hyponatremia: Secondary | ICD-10-CM | POA: Diagnosis present

## 2021-03-21 DIAGNOSIS — Z90710 Acquired absence of both cervix and uterus: Secondary | ICD-10-CM | POA: Diagnosis not present

## 2021-03-21 DIAGNOSIS — F325 Major depressive disorder, single episode, in full remission: Secondary | ICD-10-CM | POA: Diagnosis present

## 2021-03-21 DIAGNOSIS — D62 Acute posthemorrhagic anemia: Secondary | ICD-10-CM | POA: Diagnosis present

## 2021-03-21 DIAGNOSIS — J9611 Chronic respiratory failure with hypoxia: Secondary | ICD-10-CM | POA: Diagnosis present

## 2021-03-21 DIAGNOSIS — N1832 Chronic kidney disease, stage 3b: Secondary | ICD-10-CM | POA: Diagnosis present

## 2021-03-21 DIAGNOSIS — R0602 Shortness of breath: Secondary | ICD-10-CM | POA: Diagnosis present

## 2021-03-21 DIAGNOSIS — E860 Dehydration: Secondary | ICD-10-CM | POA: Diagnosis present

## 2021-03-21 DIAGNOSIS — Z79899 Other long term (current) drug therapy: Secondary | ICD-10-CM | POA: Diagnosis not present

## 2021-03-21 DIAGNOSIS — Z681 Body mass index (BMI) 19 or less, adult: Secondary | ICD-10-CM | POA: Diagnosis not present

## 2021-03-21 DIAGNOSIS — E440 Moderate protein-calorie malnutrition: Secondary | ICD-10-CM | POA: Diagnosis present

## 2021-03-21 DIAGNOSIS — I130 Hypertensive heart and chronic kidney disease with heart failure and stage 1 through stage 4 chronic kidney disease, or unspecified chronic kidney disease: Secondary | ICD-10-CM | POA: Diagnosis present

## 2021-03-21 DIAGNOSIS — Z20822 Contact with and (suspected) exposure to covid-19: Secondary | ICD-10-CM | POA: Diagnosis present

## 2021-03-21 DIAGNOSIS — J449 Chronic obstructive pulmonary disease, unspecified: Secondary | ICD-10-CM | POA: Diagnosis present

## 2021-03-21 DIAGNOSIS — E039 Hypothyroidism, unspecified: Secondary | ICD-10-CM | POA: Diagnosis present

## 2021-03-21 DIAGNOSIS — N170 Acute kidney failure with tubular necrosis: Secondary | ICD-10-CM | POA: Diagnosis present

## 2021-03-21 DIAGNOSIS — Z9981 Dependence on supplemental oxygen: Secondary | ICD-10-CM | POA: Diagnosis not present

## 2021-03-21 DIAGNOSIS — K922 Gastrointestinal hemorrhage, unspecified: Secondary | ICD-10-CM | POA: Diagnosis present

## 2021-03-21 LAB — GLUCOSE, CAPILLARY
Glucose-Capillary: 115 mg/dL — ABNORMAL HIGH (ref 70–99)
Glucose-Capillary: 116 mg/dL — ABNORMAL HIGH (ref 70–99)
Glucose-Capillary: 122 mg/dL — ABNORMAL HIGH (ref 70–99)
Glucose-Capillary: 126 mg/dL — ABNORMAL HIGH (ref 70–99)
Glucose-Capillary: 126 mg/dL — ABNORMAL HIGH (ref 70–99)
Glucose-Capillary: 128 mg/dL — ABNORMAL HIGH (ref 70–99)
Glucose-Capillary: 136 mg/dL — ABNORMAL HIGH (ref 70–99)

## 2021-03-21 LAB — TYPE AND SCREEN
ABO/RH(D): O POS
Antibody Screen: NEGATIVE
Unit division: 0

## 2021-03-21 LAB — BASIC METABOLIC PANEL
Anion gap: 6 (ref 5–15)
BUN: 64 mg/dL — ABNORMAL HIGH (ref 8–23)
CO2: 20 mmol/L — ABNORMAL LOW (ref 22–32)
Calcium: 9.6 mg/dL (ref 8.9–10.3)
Chloride: 113 mmol/L — ABNORMAL HIGH (ref 98–111)
Creatinine, Ser: 1.66 mg/dL — ABNORMAL HIGH (ref 0.44–1.00)
GFR, Estimated: 31 mL/min — ABNORMAL LOW (ref >60)
Glucose, Bld: 111 mg/dL — ABNORMAL HIGH (ref 70–99)
Potassium: 4.6 mmol/L (ref 3.5–5.1)
Sodium: 139 mmol/L (ref 135–145)

## 2021-03-21 LAB — CBC
HCT: 26.6 % — ABNORMAL LOW (ref 36.0–46.0)
Hemoglobin: 8.3 g/dL — ABNORMAL LOW (ref 12.0–15.0)
MCH: 27.5 pg (ref 26.0–34.0)
MCHC: 31.2 g/dL (ref 30.0–36.0)
MCV: 88.1 fL (ref 80.0–100.0)
Platelets: 176 10*3/uL (ref 150–400)
RBC: 3.02 MIL/uL — ABNORMAL LOW (ref 3.87–5.11)
RDW: 16.6 % — ABNORMAL HIGH (ref 11.5–15.5)
WBC: 9.1 10*3/uL (ref 4.0–10.5)
nRBC: 0 % (ref 0.0–0.2)

## 2021-03-21 LAB — BPAM RBC
Blood Product Expiration Date: 202204212359
ISSUE DATE / TIME: 202203211732
Unit Type and Rh: 5100

## 2021-03-21 MED ORDER — CYANOCOBALAMIN 1000 MCG/ML IJ SOLN: 1000.0000 ug | Freq: Once | INTRAMUSCULAR | Status: DC

## 2021-03-21 MED ORDER — ORAL CARE MOUTH RINSE: 15.0000 mL | Freq: Two times a day (BID) | OROMUCOSAL | Status: DC

## 2021-03-21 MED ORDER — VITAMIN B-12 1000 MCG PO TABS
1000.0000 ug | ORAL_TABLET | Freq: Every day | ORAL | Status: DC
  Administered 2021-03-22: 1000 ug via ORAL
  Filled 2021-03-21: qty 1

## 2021-03-21 MED ORDER — CYANOCOBALAMIN 1000 MCG/ML IJ SOLN
1000.0000 ug | Freq: Once | INTRAMUSCULAR | Status: AC
  Administered 2021-03-21: 1000 ug via INTRAMUSCULAR
  Filled 2021-03-21: qty 1

## 2021-03-21 MED ORDER — ORAL CARE MOUTH RINSE
15.0000 mL | Freq: Two times a day (BID) | OROMUCOSAL | Status: DC
  Administered 2021-03-21 – 2021-03-22 (×3): 15 mL via OROMUCOSAL

## 2021-03-21 MED ORDER — DOCUSATE SODIUM 100 MG PO CAPS
100.0000 mg | ORAL_CAPSULE | ORAL | Status: DC
  Filled 2021-03-21 (×2): qty 1

## 2021-03-21 MED ORDER — FERROUS SULFATE 325 (65 FE) MG PO TABS
325.0000 mg | ORAL_TABLET | ORAL | Status: DC
  Administered 2021-03-21: 325 mg via ORAL
  Filled 2021-03-21 (×2): qty 1

## 2021-03-21 MED ORDER — PANTOPRAZOLE SODIUM 40 MG PO TBEC
40.0000 mg | DELAYED_RELEASE_TABLET | Freq: Two times a day (BID) | ORAL | Status: DC
  Administered 2021-03-21 – 2021-03-22 (×2): 40 mg via ORAL
  Filled 2021-03-21 (×2): qty 1

## 2021-03-21 NOTE — Plan of Care (Signed)
  Problem: Clinical Measurements: Goal: Diagnostic test results will improve Outcome: Not Progressing Patient's Hbg was 8.3 yesterday and the same this morning Goal: Respiratory complications will improve Outcome: Progressing   Problem: Clinical Measurements: Goal: Respiratory complications will improve Outcome: Progressing   Problem: Activity: Goal: Risk for activity intolerance will decrease Outcome: Progressing   Problem: Pain Managment: Goal: General experience of comfort will improve Outcome: Progressing    Problem: Safety: Goal: Ability to remain free from injury will improve Outcome: Progressing

## 2021-03-21 NOTE — Progress Notes (Signed)
[facility] PROGRESS NOTE    [name]  LFY:[id number] DOB: 08-18-1941 DOA: 03/20/2021 PCP: [name], MD      Brief Narrative:  [name] is a 80 y.o. 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  In the ER, found to have Hgb 8 from baseline 12, and reports of "rust-colored" stools.  CXR clear/hyperinflated, wheezing on exam.           Assessment & Plan:  Acute blood loss anemia due to GI bleed Unclear the chronicity of this, but may be slow.  FOBT positive by ER.  Transfused 1 unit overnight.  Hgb unchanged 8.3 (in context of fluid administration).  B12 low. Ferritin seems low normal and iron sat very low.  -Give IM B12 now and start PO B12 tomorrow, check in 1 month  -Trend Hgb -Start Iron/docusate    GI bleed Suspect lower.    Colonoscopy by Dr. Bary Castilla 2005 with 3 sessile 68mm polyps. -Consult GI  ADDENDUM: GI recommend PPI and observe with outpatient follow up for colonoscopy vs EGD vs both.     AKI on CKD IIIb Cr 2.2 on admission from baseline 1.2.  Down to 1.6 today with fluids -Continue IV fluids this morning -Trend BMP  Chronic hypoxic respiratory failure COPD FEV1 42% 8 years ago.  No wheezing to suggest flare now. -Continue ICS-LABA-LAMA   Chronic diastolic CHF Hypertension BP low.  Appears dehydrated MM dry. -Hold carvedilol, lisinopril, torsemide   Pre-Diabetes and hyperlipidemia A1c 6.3%, glucose normal here -Continue atorvastatin   Depression -Continue mirtazapine -Continue Sertraline  Hyponatremia Resolved with fluids   Moderate protein calorie nutrition BMI 18, loss of subcutaneous muscle mass and fat, chronic respiratory failure, weight loss, and poor p.o. intake.         Disposition:  Status is: Inpatient  Remains inpatient appropriate because:IV treatments appropriate due to intensity of illness or  inability to take PO and and still too weak to stand due to anemia, may require additional transfusion, in setting of FEV1 <50%   Dispo: The patient is from: Home              Anticipated d/c is to: SNF              Patient currently is not medically stable to d/c.   Difficult to place patient No             Level of care: Med-Surg       MDM: The below labs and imaging reports were reviewed and summarized above.  Medication management as above.    DVT prophylaxis: SCDs Start: 03/20/21 1450  Code Status: FULL Family Communication: daughter in law by phone             Subjective: Still very weak and tired, barely able to stand, needs assistance.  No wheezing.  No cough, sputum, fever.  No chest pain, no headache.  Still passing some rust colored stool.  Objective: Vitals:   03/21/21 0336 03/21/21 0752 03/21/21 1158 03/21/21 1509  BP: (!) 101/57 (!) 101/52 (!) 94/50 (!) 111/55  Pulse: 85 81 73 78  Resp: (!) 24 (!) 22 20 20   Temp: 98.7 F (37.1 C) 98.9 F (37.2 C) 98.1 F (36.7 C) 98 F (36.7 C)  TempSrc: Oral Oral Oral Oral  SpO2: 99% 97% 99% 100%  Weight:      Height:  Intake/Output Summary (Last 24 hours) at 03/21/2021 1815 Last data filed at 03/20/2021 2125 Gross per 24 hour  Intake 400 ml  Output --  Net 400 ml   Filed Weights   03/20/21 1216  Weight: 44.9 kg    Examination: General appearance: Thin frail elderly adult female, alert and in no obvious distress.   HEENT: Anicteric, conjunctiva pink, lids and lashes normal. No nasal deformity, discharge, epistaxis.  Lips moist.   Skin: Warm and dry.  no jaundice.  No suspicious rashes or lesions. Cardiac: RRR, nl S1-S2, no murmurs appreciated.  Capillary refill is brisk.  No LE edema.  Radial  pulses 2+ and symmetric. Respiratory: Normal respiratory rate and rhythm.  CTAB without rales or wheezes. Abdomen: Abdomen soft.  No TTP or gaurding. No ascites, distension, hepatosplenomegaly.    MSK: No deformities or effusions. Neuro: Awake and alert.  EOMI, moves all extremities. Speech fluent.    Psych: Sensorium intact and responding to questions, attention normal. Affect normal.  Judgment and insight appear normal.    Data Reviewed: I have personally reviewed following labs and imaging studies:  CBC: Recent Labs  Lab 03/20/21 1207 03/21/21 0459  WBC 10.3 9.1  HGB 8.3* 8.3*  HCT 25.7* 26.6*  MCV 88.0 88.1  PLT 215 161   Basic Metabolic Panel: Recent Labs  Lab 03/20/21 1207 03/21/21 0459  NA 134* 139  K 5.0 4.6  CL 108 113*  CO2 18* 20*  GLUCOSE 143* 111*  BUN 84* 64*  CREATININE 2.24* 1.66*  CALCIUM 10.3 9.6   GFR: Estimated Creatinine Clearance: 18.5 mL/min (A) (by C-G formula based on SCr of 1.66 mg/dL (H)). Liver Function Tests: Recent Labs  Lab 03/20/21 1207  AST 11*  ALT 10  ALKPHOS 66  BILITOT 0.6  PROT 7.7  ALBUMIN 3.7   No results for input(s): LIPASE, AMYLASE in the last 168 hours. No results for input(s): AMMONIA in the last 168 hours. Coagulation Profile: No results for input(s): INR, PROTIME in the last 168 hours. Cardiac Enzymes: No results for input(s): CKTOTAL, CKMB, CKMBINDEX, TROPONINI in the last 168 hours. BNP (last 3 results) No results for input(s): PROBNP in the last 8760 hours. HbA1C: Recent Labs    03/20/21 1456  HGBA1C 6.3*   CBG: Recent Labs  Lab 03/20/21 2356 03/21/21 0409 03/21/21 0757 03/21/21 1145 03/21/21 1625  GLUCAP 126* 126* 122* 115* 128*   Lipid Profile: No results for input(s): CHOL, HDL, LDLCALC, TRIG, CHOLHDL, LDLDIRECT in the last 72 hours. Thyroid Function Tests: No results for input(s): TSH, T4TOTAL, FREET4, T3FREE, THYROIDAB in the last 72 hours. Anemia Panel: Recent Labs    03/20/21 1206 03/20/21 1456  VITAMINB12 205  --   FOLATE  --  6.4  FERRITIN  --  178  TIBC  --  235*  IRON  --  12*  RETICCTPCT  --  1.7   Urine analysis:    Component Value Date/Time   COLORURINE  STRAW (A) 12/14/2020 1625   APPEARANCEUR CLEAR (A) 12/14/2020 1625   APPEARANCEUR CLEAR 07/02/2013 1144   LABSPEC 1.009 12/14/2020 1625   LABSPEC 1.015 07/02/2013 1144   PHURINE 5.0 12/14/2020 1625   GLUCOSEU NEGATIVE 12/14/2020 1625   GLUCOSEU NEGATIVE 07/02/2013 1144   HGBUR SMALL (A) 12/14/2020 1625   BILIRUBINUR NEGATIVE 12/14/2020 1625   BILIRUBINUR NEGATIVE 07/02/2013 [place] 12/14/2020 1625   PROTEINUR NEGATIVE 12/14/2020 1625   NITRITE NEGATIVE 12/14/2020 1625   LEUKOCYTESUR NEGATIVE 12/14/2020 1625  LEUKOCYTESUR NEGATIVE 07/02/2013 1144   Sepsis Labs: @LABRCNTIP (procalcitonin:4,lacticacidven:4)  ) Recent Results (from the past 240 hour(s))  Resp Panel by RT-PCR (Flu A&B, Covid) Nasopharyngeal Swab     Status: None   Collection Time: 03/20/21  2:56 PM   Specimen: Nasopharyngeal Swab; Nasopharyngeal(NP) swabs in vial transport medium  Result Value Ref Range Status   SARS Coronavirus 2 by RT PCR NEGATIVE NEGATIVE Final    Comment: (NOTE) SARS-CoV-2 target nucleic acids are NOT DETECTED.  The SARS-CoV-2 RNA is generally detectable in upper respiratory specimens during the acute phase of infection. The lowest concentration of SARS-CoV-2 viral copies this assay can detect is 138 copies/mL. A negative result does not preclude SARS-Cov-2 infection and should not be used as the sole basis for treatment or other patient management decisions. A negative result may occur with  improper specimen collection/handling, submission of specimen other than nasopharyngeal swab, presence of viral mutation(s) within the areas targeted by this assay, and inadequate number of viral copies(<138 copies/mL). A negative result must be combined with clinical observations, patient history, and epidemiological information. The expected result is Negative.  Fact Sheet for Patients:  EntrepreneurPulse.com.au  Fact Sheet for Healthcare Providers:   IncredibleEmployment.be  This test is no t yet approved or cleared by the [place] FDA and  has been authorized for detection and/or diagnosis of SARS-CoV-2 by FDA under an Emergency Use Authorization (EUA). This EUA will remain  in effect (meaning this test can be used) for the duration of the COVID-19 declaration under Section 564(b)(1) of the Act, 21 U.S.C.section 360bbb-3(b)(1), unless the authorization is terminated  or revoked sooner.       Influenza A by PCR NEGATIVE NEGATIVE Final   Influenza B by PCR NEGATIVE NEGATIVE Final    Comment: (NOTE) The Xpert Xpress SARS-CoV-2/FLU/RSV plus assay is intended as an aid in the diagnosis of influenza from Nasopharyngeal swab specimens and should not be used as a sole basis for treatment. Nasal washings and aspirates are unacceptable for Xpert Xpress SARS-CoV-2/FLU/RSV testing.  Fact Sheet for Patients: EntrepreneurPulse.com.au  Fact Sheet for Healthcare Providers: IncredibleEmployment.be  This test is not yet approved or cleared by the [place] FDA and has been authorized for detection and/or diagnosis of SARS-CoV-2 by FDA under an Emergency Use Authorization (EUA). This EUA will remain in effect (meaning this test can be used) for the duration of the COVID-19 declaration under Section 564(b)(1) of the Act, 21 U.S.C. section 360bbb-3(b)(1), unless the authorization is terminated or revoked.  Performed at [facility], [address] [place], [place] [address]          Radiology Studies: DG Chest Portable 1 View  Result Date: 03/20/2021 CLINICAL DATA:  Worsening shortness of breath over the past 3 days. EXAM: PORTABLE CHEST 1 VIEW COMPARISON:  Chest x-ray dated December 14, 2020. FINDINGS: The heart size and mediastinal contours are within normal limits. Normal pulmonary vascularity. The lungs remain hyperinflated with emphysematous changes and  chronically coarsened interstitial markings. Mild right basilar atelectasis/scarring. Possible trace right pleural effusion. No focal consolidation or pneumothorax. No acute osseous abnormality. IMPRESSION: 1. Possible trace right pleural effusion. 2. COPD. Electronically Signed   By: Titus Dubin M.D.   On: 03/20/2021 13:02        Scheduled Meds: . atorvastatin  20 mg Oral Daily  . docusate sodium  100 mg Oral QODAY  . ferrous sulfate  325 mg Oral QODAY  . mouth rinse  15 mL Mouth Rinse BID  .  mirtazapine  7.5 mg Oral QHS  . mometasone-formoterol  2 puff Inhalation BID  . pantoprazole  40 mg Oral BID AC  . sertraline  50 mg Oral Daily  . sodium chloride flush  3 mL Intravenous Q12H  . tiotropium  18 mcg Inhalation Daily  . [START ON 03/22/2021] vitamin B-12  1,000 mcg Oral Daily   Continuous Infusions: . sodium chloride       LOS: 0 days    Time spent: 35 minutes    Edwin Dada, MD [facility] 03/21/2021, 6:15 PM     Please page though [place] or Epic secure chat:  For [employer], Adult nurse

## 2021-03-21 NOTE — Care Management Obs Status (Signed)
[place] NOTIFICATION   Patient Details  Name: RILEE KNOLL MRN: [id number] Date of Birth: [birth year]   Medicare Observation Status Notification Given:  No (Patient admitted under observation status.  It is listed as the patient has a legal guardian.  I have called all family listed on file to clarify and review observation letter.  Will attempt to call again)    Beverly Sessions, RN 03/21/2021, 3:56 PM

## 2021-03-21 NOTE — Care Management (Signed)
Patient admitted under observation status.  It is listed as the patient has a legal guardian.  I have called all family listed on file to clarify and review observation letter.  Will attempt to call again

## 2021-03-21 NOTE — Plan of Care (Signed)
  Problem: Clinical Measurements: Goal: Respiratory complications will improve Outcome: Progressing   

## 2021-03-21 NOTE — Consult Note (Signed)
Jasmine Darby, MD [address]  [place]  [place], [place] [address]  Main: [phone]  Fax: [phone] Pager: [phone]   Consultation  Referring Provider:     No ref. provider found Primary Care Physician:  [name], MD Primary Gastroenterologist: Althia Forts         Reason for Consultation:     Symptomatic anemia  Date of Admission:  03/20/2021 Date of Consultation:  03/21/2021         HPI:   [name] is a 80 y.o. female with history of COPD on 4 L oxygen, diastolic heart failure, history of diabetes, stage III CKD, hypertension is admitted with shortness of breath and was found to have anemia.  There is no reported history of melena, hematochezia, rectal bleeding, nausea, vomiting, abdominal pain, coffee-ground emesis.  Patient's hemoglobin on admission was 8.3, remained stable last 24 hours, MCV normal, platelets 176.  Her hemoglobin was 8.8 on 12/14/2020.  Last normal hemoglobin was 12 on 10/19/2017.  Work-up of anemia revealed ferritin 178, low iron, TIBC suggestive of anemia of chronic disease.  She does have mild B12 deficiency, 205, normal folate.  She had worsening of BUN/creatinine during this admission which is currently improving.  There are no witnessed episodes of hematochezia, melena or hematemesis or rectal bleeding since admission.  Patient is at baseline oxygen support at 4 L and oxygenating well, she is otherwise hemodynamically stable.  When I interviewed the patient, no stick was in the room who mentioned that patient was having dark brown bowel movements.  Patient also reported that her bowel movements were dark brown for 2 days.  Patient denies any abdominal pain.  She lives at home with her son  NSAIDs: None  Antiplts/Anticoagulants/Anti thrombotics: None  GI Procedures: Colonoscopy 09/28/2004, a subcentimeter polyp was removed, pathology revealed tubular adenoma.  Past Medical History:  Diagnosis Date  . CHF (congestive  heart failure) ([place])   . COPD (chronic obstructive pulmonary disease) ([place])   . Diabetes mellitus without complication ([place])   . Environmental allergies   . High blood pressure   . High cholesterol     Past Surgical History:  Procedure Laterality Date  . BREAST LUMPECTOMY Bilateral 1962 (L) 2002(R)  . VESICOVAGINAL FISTULA CLOSURE W/ TAH  1975    Prior to Admission medications   Medication Sig Start Date End Date Taking? Authorizing Provider  acetaminophen (TYLENOL) 500 MG tablet Take 500 mg by mouth every 6 (six) hours as needed. 2 in am and 2 at night   Yes [provider]  albuterol (PROAIR HFA) 108 (90 Base) MCG/ACT inhaler Inhale 2 puffs into the lungs every 6 (six) hours as needed for wheezing or shortness of breath. 07/22/19  Yes Wilhelmina Mcardle, MD  atorvastatin (LIPITOR) 20 MG tablet Take 20 mg by mouth daily.   Yes [provider]  carvedilol (COREG) 6.25 MG tablet Take 6.25 mg by mouth 2 (two) times daily with a meal.   Yes [provider]  Fluticasone-Salmeterol (ADVAIR DISKUS) 250-50 MCG/DOSE AEPB INHALE 1 PUFF BY MOUTH INTO THE LUNGS TWICE DAILY 08/25/20  Yes Kasa, Maretta Bees, MD  lisinopril (ZESTRIL) 5 MG tablet Take 5 mg by mouth daily. 01/11/21  Yes [provider]  mirtazapine (REMERON) 7.5 MG tablet Take 7.5 mg by mouth at bedtime. 10/24/20  Yes [provider]  potassium chloride (MICRO-K) 10 MEQ CR capsule Take 10 mEq by mouth daily.   Yes [provider]  sertraline (ZOLOFT) 50 MG tablet Take by mouth. 01/11/21 01/11/22 Yes [provider]  Tiotropium Bromide Monohydrate (SPIRIVA RESPIMAT) 2.5 MCG/ACT AERS INHALE 2 PUFFS INTO THE LUNGS DAILY 03/22/20  Yes Flora Lipps, MD  torsemide (DEMADEX) 20 MG tablet Take 20 mg by mouth daily as needed.   Yes [provider]  lisinopril (ZESTRIL) 5 MG tablet Take 1 tablet (5 mg total) by mouth daily. 12/17/20 01/16/21  Sidney Ace, MD   Current  Facility-Administered Medications:  .  0.9 %  sodium chloride infusion, 250 mL, Intravenous, PRN, Agbata, Tochukwu, MD .  0.9 %  sodium chloride infusion, , Intravenous, Continuous, Agbata, Tochukwu, MD, Last Rate: 100 mL/hr at 03/21/21 0551, New Bag at 03/21/21 0551 .  acetaminophen (TYLENOL) tablet 1,000 mg, 1,000 mg, Oral, Q6H PRN, Lang Snow, NP, 1,000 mg at 03/21/21 0859 .  albuterol (VENTOLIN HFA) 108 (90 Base) MCG/ACT inhaler 2 puff, 2 puff, Inhalation, Q6H PRN, Agbata, Tochukwu, MD .  atorvastatin (LIPITOR) tablet 20 mg, 20 mg, Oral, Daily, Agbata, Tochukwu, MD, 20 mg at 03/21/21 0859 .  docusate sodium (COLACE) capsule 100 mg, 100 mg, Oral, QODAY, Danford, Christopher P, MD .  ferrous sulfate tablet 325 mg, 325 mg, Oral, QODAY, Danford, Suann Larry, MD, 325 mg at 03/21/21 1303 .  MEDLINE mouth rinse, 15 mL, Mouth Rinse, BID, Agbata, Tochukwu, MD, 15 mL at 03/21/21 1304 .  mirtazapine (REMERON) tablet 7.5 mg, 7.5 mg, Oral, QHS, Agbata, Tochukwu, MD, 7.5 mg at 03/20/21 2124 .  mometasone-formoterol (DULERA) 200-5 MCG/ACT inhaler 2 puff, 2 puff, Inhalation, BID, Agbata, Tochukwu, MD, 2 puff at 03/21/21 0856 .  ondansetron (ZOFRAN) tablet 4 mg, 4 mg, Oral, Q6H PRN **OR** ondansetron (ZOFRAN) injection 4 mg, 4 mg, Intravenous, Q6H PRN, Agbata, Tochukwu, MD .  pantoprazole (PROTONIX) EC tablet 40 mg, 40 mg, Oral, BID AC, Shiva Karis, Tally Due, MD .  sertraline (ZOLOFT) tablet 50 mg, 50 mg, Oral, Daily, Agbata, Tochukwu, MD, 50 mg at 03/21/21 0859 .  sodium chloride flush (NS) 0.9 % injection 3 mL, 3 mL, Intravenous, Q12H, Agbata, Tochukwu, MD, 3 mL at 03/21/21 0904 .  sodium chloride flush (NS) 0.9 % injection 3 mL, 3 mL, Intravenous, PRN, Agbata, Tochukwu, MD .  tiotropium (SPIRIVA) inhalation capsule ([facility] use ONLY) 18 mcg, 18 mcg, Inhalation, Daily, Dorothe Pea, RPH, 18 mcg at 03/21/21 7793  Facility-Administered Medications Ordered in Other Encounters:  .  albuterol  (PROVENTIL) (2.5 MG/3ML) 0.083% nebulizer solution 2.5 mg, 2.5 mg, Nebulization, Once, Wilhelmina Mcardle, MD   Family History  Problem Relation Age of Onset  . Heart disease Father   . Rheum arthritis Mother   . Cancer Mother        breast  . Heart disease Brother        2 brothers  . Cancer Maternal Aunt        cervical  . Cancer Maternal Aunt        stomach     Social History   Tobacco Use  . Smoking status: Former Smoker    Packs/day: 1.00    Years: 45.00    Pack years: 45.00    Types: Cigarettes    Quit date: 07/03/2013    Years since quitting: 7.7  . Smokeless tobacco: Never Used  Substance Use Topics  . Alcohol use: Yes    Alcohol/week: 0.0 standard drinks    Comment: occasional glass of wine  . Drug use: No    Allergies as of  03/20/2021 - Review Complete 03/20/2021  Allergen Reaction Noted  . Mucinex d [pseudoephedrine-guaifenesin er]  03/24/2014    Review of Systems:    All systems reviewed and negative except where noted in HPI.   Physical Exam:  Vital signs in last 24 hours: Temp:  [97.7 F (36.5 C)-99.4 F (37.4 C)] 98.1 F (36.7 C) (03/22 1158) Pulse Rate:  [73-97] 73 (03/22 1158) Resp:  [16-24] 20 (03/22 1158) BP: (93-119)/(47-67) 94/50 (03/22 1158) SpO2:  [92 %-100 %] 99 % (03/22 1158) Last BM Date: 03/20/21 General:   Ill-appearing, cooperative, in mild respiratory distress due to underlying COPD Head:  Normocephalic and atraumatic. Eyes:   No icterus.   Conjunctiva pink. PERRLA. Ears:  Normal auditory acuity. Neck:  Supple; no masses or thyroidomegaly Lungs: Respirations even and unlabored. Lungs clear to auscultation bilaterally.   No wheezes, crackles, or rhonchi.  Heart:  Regular rate and rhythm;  Without murmur, clicks, rubs or gallops Abdomen:  Soft, nondistended, nontender. Normal bowel sounds. No appreciable masses or hepatomegaly.  No rebound or guarding.  Rectal:  Not performed. Msk:  Symmetrical without gross deformities.   Generalized weakness Extremities:  Without edema, cyanosis or clubbing. Neurologic:  Alert and oriented x3;  grossly normal neurologically. Skin:  Intact without significant lesions or rashes. Psych:  Alert and cooperative. Normal affect.  LAB RESULTS: CBC Latest Ref Rng & Units 03/21/2021 03/20/2021 12/14/2020  WBC 4.0 - 10.5 K/uL 9.1 10.3 11.9(H)  Hemoglobin 12.0 - 15.0 g/dL 8.3(L) 8.3(L) 8.8(L)  Hematocrit 36.0 - 46.0 % 26.6(L) 25.7(L) 29.2(L)  Platelets 150 - 400 K/uL 176 215 219    BMET BMP Latest Ref Rng & Units 03/21/2021 03/20/2021 12/17/2020  Glucose 70 - 99 mg/dL 111(H) 143(H) 185(H)  BUN 8 - 23 mg/dL 64(H) 84(H) 43(H)  Creatinine 0.44 - 1.00 mg/dL 1.66(H) 2.24(H) 1.24(H)  Sodium 135 - 145 mmol/L 139 134(L) 141  Potassium 3.5 - 5.1 mmol/L 4.6 5.0 3.4(L)  Chloride 98 - 111 mmol/L 113(H) 108 95(L)  CO2 22 - 32 mmol/L 20(L) 18(L) 34(H)  Calcium 8.9 - 10.3 mg/dL 9.6 10.3 10.1    LFT Hepatic Function Latest Ref Rng & Units 03/20/2021 12/14/2020 10/19/2017  Total Protein 6.5 - 8.1 g/dL 7.7 7.1 7.4  Albumin 3.5 - 5.0 g/dL 3.7 3.3(L) 4.1  AST 15 - 41 U/L 11(L) 17 17  ALT 0 - 44 U/L 10 9 16   Alk Phosphatase 38 - 126 U/L 66 67 68  Total Bilirubin 0.3 - 1.2 mg/dL 0.6 0.7 0.6     STUDIES: DG Chest Portable 1 View  Result Date: 03/20/2021 CLINICAL DATA:  Worsening shortness of breath over the past 3 days. EXAM: PORTABLE CHEST 1 VIEW COMPARISON:  Chest x-ray dated December 14, 2020. FINDINGS: The heart size and mediastinal contours are within normal limits. Normal pulmonary vascularity. The lungs remain hyperinflated with emphysematous changes and chronically coarsened interstitial markings. Mild right basilar atelectasis/scarring. Possible trace right pleural effusion. No focal consolidation or pneumothorax. No acute osseous abnormality. IMPRESSION: 1. Possible trace right pleural effusion. 2. COPD. Electronically Signed   By: Titus Dubin M.D.   On: 03/20/2021 13:02       Impression / Plan:   [name] is a 80 y.o. female with history of COPD on 4 L oxygen, diastolic heart failure, hypertension, diabetes, stage III CKD is admitted with exertional dyspnea secondary to anemia.  Normocytic anemia Patient does not demonstrate any signs or symptoms to suggest active GI bleed Iron studies  are consistent with anemia of chronic disease B12 levels are low, recommend B12 replacement, 1022mg daily Recommend to start Protonix 40 mg twice daily There is no urgent or inpatient indication for endoscopic evaluation and patient with multiple comorbidities, risks overweigh benefits at this time Recommend follow-up with GI as outpatient in 4 to 6 weeks Advance diet as tolerated  Thank you for involving me in the care of this patient.  GI will sign off at this time, please call [email or web address] with questions or concerns    LOS: 0 days   RSherri Sear MD  03/21/2021, 1:50 PM   Note: This dictation was prepared with Dragon dictation along with smaller phrase technology. Any transcriptional errors that result from this process are unintentional.

## 2021-03-22 DIAGNOSIS — J449 Chronic obstructive pulmonary disease, unspecified: Secondary | ICD-10-CM | POA: Diagnosis not present

## 2021-03-22 DIAGNOSIS — M6281 Muscle weakness (generalized): Secondary | ICD-10-CM | POA: Diagnosis not present

## 2021-03-22 LAB — GLUCOSE, CAPILLARY
Glucose-Capillary: 107 mg/dL — ABNORMAL HIGH (ref 70–99)
Glucose-Capillary: 111 mg/dL — ABNORMAL HIGH (ref 70–99)
Glucose-Capillary: 110 mg/dL — ABNORMAL HIGH (ref 70–99)

## 2021-03-22 LAB — BASIC METABOLIC PANEL
Anion gap: 5 (ref 5–15)
BUN: 44 mg/dL — ABNORMAL HIGH (ref 8–23)
CO2: 20 mmol/L — ABNORMAL LOW (ref 22–32)
Calcium: 10.1 mg/dL (ref 8.9–10.3)
Chloride: 116 mmol/L — ABNORMAL HIGH (ref 98–111)
Creatinine, Ser: 1.35 mg/dL — ABNORMAL HIGH (ref 0.44–1.00)
GFR, Estimated: 39 mL/min — ABNORMAL LOW (ref >60)
Glucose, Bld: 106 mg/dL — ABNORMAL HIGH (ref 70–99)
Potassium: 4.6 mmol/L (ref 3.5–5.1)
Sodium: 141 mmol/L (ref 135–145)

## 2021-03-22 LAB — CBC
HCT: 27.9 % — ABNORMAL LOW (ref 36.0–46.0)
Hemoglobin: 8.8 g/dL — ABNORMAL LOW (ref 12.0–15.0)
MCH: 28 pg (ref 26.0–34.0)
MCHC: 31.5 g/dL (ref 30.0–36.0)
MCV: 88.9 fL (ref 80.0–100.0)
Platelets: 202 10*3/uL (ref 150–400)
RBC: 3.14 MIL/uL — ABNORMAL LOW (ref 3.87–5.11)
RDW: 16.8 % — ABNORMAL HIGH (ref 11.5–15.5)
WBC: 8.8 10*3/uL (ref 4.0–10.5)
nRBC: 0 % (ref 0.0–0.2)

## 2021-03-22 MED ORDER — POTASSIUM CHLORIDE ER 10 MEQ PO CPCR: 10.0000 meq | ORAL_CAPSULE | Freq: Every day | ORAL | 0 refills | Status: DC | PRN

## 2021-03-22 MED ORDER — FERROUS SULFATE 325 (65 FE) MG PO TABS: 325.0000 mg | ORAL_TABLET | ORAL | 3 refills | Status: DC

## 2021-03-22 MED ORDER — CYANOCOBALAMIN 1000 MCG PO TABS: 1000.0000 ug | ORAL_TABLET | Freq: Every day | ORAL | 2 refills | Status: DC

## 2021-03-22 MED ORDER — DOCUSATE SODIUM 100 MG PO CAPS: 100.0000 mg | ORAL_CAPSULE | ORAL | 0 refills | Status: DC

## 2021-03-22 MED ORDER — PANTOPRAZOLE SODIUM 40 MG PO TBEC: 40.0000 mg | DELAYED_RELEASE_TABLET | Freq: Two times a day (BID) | ORAL | 1 refills | Status: DC

## 2021-03-22 MED ORDER — CARVEDILOL 3.125 MG PO TABS: 6.2500 mg | ORAL_TABLET | Freq: Two times a day (BID) | ORAL | 1 refills | Status: DC

## 2021-03-22 NOTE — Evaluation (Signed)
Physical Therapy Evaluation Patient Details Name: [name] MRN: [id number] DOB: 19-Jun-1941 Today's Date: 03/22/2021   History of Present Illness  presented to ER secondary to SOB; admitted for management of symptomatic anemia.  Clinical Impression  Upon evaluation, patient alert and oriented; follows commands and agreeable to session.  Endorses improvement in respiratory status and hopeful for discharge home this date.  Bilat UE/LE strength and ROM grossly symmetrical and WFL for basic transfers and gait, except R hip grossly limited by chronic hip pain ("needs a replacement").  Currently requiring min assist for bed mobility; cga/min assist for sit/stand, basic transfers and gait (75') with RW, cga/min assist.  Demonstrates 3-point, step to gait pattern; limited tolerance for R LE WBing (due to chronic R hip pain; partial WBing at best).  Slow and deliberate, but good use/management of RW, good awareness of activity limitations, signs/symptoms of fatigue and energy conservation strategies with gait.  Demonstrates ability to indep pace activity and adjust distances as needed based on respiratory reserves.  Sats >90-91% on 4L supplemental O2 throughout session. Would benefit from skilled PT to address above deficits and promote optimal return to PLOF.; Recommend transition to HHPT upon discharge from acute hospitalization.     Follow Up Recommendations Home health PT    Equipment Recommendations   (has necessary equipment)    Recommendations for Other Services       Precautions / Restrictions Precautions Precautions: Fall Restrictions Weight Bearing Restrictions: No      Mobility  Bed Mobility Overal bed mobility: Needs Assistance Bed Mobility: Supine to Sit;Sit to Supine     Supine to sit: Supervision Sit to supine: Min assist   General bed mobility comments: assist for R LE elevation into bed (due to chronic, R hip pain); decreased tolerance for R hip flexion with  transitional movements    Transfers Overall transfer level: Needs assistance Equipment used: Rolling walker (2 wheeled) Transfers: Sit to/from Stand Sit to Stand: Min guard         General transfer comment: requires hand/UE support; limited bilat knee flexion, incresaed time/effort for anterior weight translation with sit/stand movement  Ambulation/Gait Ambulation/Gait assistance: Min guard;Min assist Gait Distance (Feet): 75 Feet Assistive device: Rolling walker (2 wheeled)       General Gait Details: 3-point, step to gait pattern; limited tolerance for R LE WBing (due to chronic R hip pain; partial WBing at best).  Slow and deliberate, but good use/management of RW, good awareness of activity limitations, signs/symptoms of fatigue and energy conservation strategies with gait.  Demonstrates ability to indep pace activity and adjust distances as needed based on respiratory reserves  Financial trader Rankin (Stroke Patients Only)       Balance Overall balance assessment: Needs assistance Sitting-balance support: No upper extremity supported;Feet supported Sitting balance-Leahy Scale: Good     Standing balance support: Bilateral upper extremity supported Standing balance-Leahy Scale: Fair                               Pertinent Vitals/Pain Pain Assessment: No/denies pain    Home Living Family/patient expects to be discharged to:: Private residence Living Arrangements: Children;Other relatives Available Help at Discharge: Family;Available 24 hours/day Type of Home: Mobile home Home Access: Stairs to enter;Ramped entrance Entrance Stairs-Rails: Right;Left Entrance Stairs-Number of Steps: 4 Home Layout: One level Home Equipment: Environmental consultant -  2 wheels;Transport chair Additional Comments: On O2 4L baseline; takes sponge baths at sink, uses depends for intermittent bowel issues.    Prior Function Level of Independence:  Independent with assistive device(s);Needs assistance         Comments: Sup/mod indep for ADLs, household mobilization with RW; family assists with bathing/dressing, iADLs and household responsibilities as needed.  Manual WC for community distances (family propels).  Denies recent fall history.  Home O2 at 4L     Hand Dominance   Dominant Hand: Right    Extremity/Trunk Assessment   Upper Extremity Assessment Upper Extremity Assessment: Overall WFL for tasks assessed    Lower Extremity Assessment Lower Extremity Assessment: Overall WFL for tasks assessed (grossly 3-/5 R hip, limited by pain; otherwise, grossly at least 4-/5 throughout bilat LEs)       Communication   Communication: No difficulties  Cognition Arousal/Alertness: Awake/alert Behavior During Therapy: WFL for tasks assessed/performed Overall Cognitive Status: Within Functional Limits for tasks assessed                                        General Comments      Exercises Other Exercises Other Exercises: Upper and lower body dressing, min assist; reviewed hemi-dressing techniques to accomodate R hip pain. Patient aware of techniques, voices understanding and voices availability of family to assist as needed. Other Exercises: Reviewed continued use of RW, importance of activity pacing, energy conservation and recognition of signs/symptoms of fatigue; patient with good awareness of these principles and indep demonstrates use during session.   Assessment/Plan    PT Assessment Patient needs continued PT services  PT Problem List Decreased strength;Decreased range of motion;Decreased activity tolerance;Decreased mobility;Decreased cognition;Decreased knowledge of use of DME;Decreased balance;Decreased safety awareness;Cardiopulmonary status limiting activity;Decreased coordination;Decreased knowledge of precautions       PT Treatment Interventions DME instruction;Gait training;Functional mobility  training;Therapeutic activities;Therapeutic exercise;Balance training;Patient/family education    PT Goals (Current goals can be found in the Care Plan section)  Acute Rehab PT Goals Patient Stated Goal: to return home with family PT Goal Formulation: With patient Time For Goal Achievement: 04/05/21 Potential to Achieve Goals: Good    Frequency Min 2X/week   Barriers to discharge        Co-evaluation               AM-PAC PT "6 Clicks" Mobility  Outcome Measure Help needed turning from your back to your side while in a flat bed without using bedrails?: None Help needed moving from lying on your back to sitting on the side of a flat bed without using bedrails?: A Little Help needed moving to and from a bed to a chair (including a wheelchair)?: A Little Help needed standing up from a chair using your arms (e.g., wheelchair or bedside chair)?: A Little Help needed to walk in hospital room?: A Little Help needed climbing 3-5 steps with a railing? : A Little 6 Click Score: 19    End of Session Equipment Utilized During Treatment: Gait belt;Oxygen Activity Tolerance: Patient tolerated treatment well Patient left: in bed;with call bell/phone within reach;with bed alarm set Nurse Communication: Mobility status PT Visit Diagnosis: Muscle weakness (generalized) (M62.81);Difficulty in walking, not elsewhere classified (R26.2)    Time: 5093-2671 PT Time Calculation (min) (ACUTE ONLY): 23 min   Charges:   PT Evaluation $PT Eval Moderate Complexity: 1 Mod PT Treatments $Therapeutic Activity: 8-22  mins       Joleen Stuckert H. Owens Shark, PT, DPT, NCS 03/22/21, 11:55 AM [phone]

## 2021-03-22 NOTE — Discharge Summary (Signed)
Physician Discharge Summary  [name] CLE:[id number] DOB: 24-Jun-1941 DOA: 03/20/2021  PCP: Jasmine Ruths, MD  Admit date: 03/20/2021 Discharge date: 03/22/2021  Admitted From: Home  Disposition: Home   Recommendations for Outpatient Follow-up:  1. Follow up with PCP in 1-2 weeks 2. Please obtain BMP/CBC in one week 3. Needs  to follow-up with GI in 4 weeks  Home Health: Home health PT  Discharge Condition: Stable.  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  Patient in the ED patient was found to have hemoglobin of 8 from baseline 12, report rust colored red stool, chest x-ray hyperinflated .  Acute blood loss anemia due to possibly GI bleed, possible slow chronic GI bleed.  Patient received 1 unit of packed red blood cell.  B12 level low normal range iron level low.  Ferritin normal rqnge.  Patient was a started on B12 and iron supplement. Hb  has remained stable around 8.8 She denies shortness of breath on exertion.  Patient was evaluated by GI who is recommending prophylaxis PPI, observation and follow-up as an outpatient in 4 weeks with GI to determine if patient will need endoscopy colonoscopy.  AKI on CKD stage IIIb Patient presented with a creatinine of 2.2 on admission from baseline 1.2.  She received IV fluids.  Creatinine has decreased to 1.3.  Chronic hypoxic respiratory failure, COPD Continue ICS LABA LAMA. No evidence of acute exacerbation  Chronic diastolic heart failure, hypertension Resume torsemide as needed resume lower dose carvedilol.  Discontinue lisinopril due to AKI  Prediabetes and hyperlipidemia; hemoglobin A1c 6.3: Continue with statins  Depression: Continue with mirtazapine  and sertraline Hyponatremia: Resolved with fluids. Moderate protein caloric malnutrition Ensure BMI 18, loss of subcutaneous muscle mass and fat, chronic respiratory failure, weight loss, and poor p.o. intake.    Discharge Diagnoses:  Principal Problem:   Symptomatic anemia Active Problems:   COPD (chronic obstructive pulmonary disease) (HCC)   Chronic respiratory failure (HCC)   HTN (hypertension), benign   Major depression in remission ([place])   Type 2 diabetes mellitus with stage 3 chronic kidney disease (HCC)   CHF (congestive heart failure) (HCC)   AKI (acute kidney injury) ([place])   Anemia    Discharge Instructions  Discharge Instructions    Diet - low sodium heart healthy   Complete by: As directed    Increase activity slowly   Complete by: As directed      Allergies as of 03/22/2021      Reactions   Mucinex D [pseudoephedrine-guaifenesin Er]    Rash on neck      Medication List    STOP taking these medications   lisinopril 5 MG tablet Commonly known as: ZESTRIL     TAKE these medications   acetaminophen 500 MG tablet Commonly known as: TYLENOL Take 500 mg by mouth every 6 (six) hours as needed. 2 in am and 2 at night   albuterol 108 (90 Base) MCG/ACT inhaler Commonly known as: ProAir HFA Inhale 2 puffs into the lungs every 6 (six) hours as needed for wheezing or shortness of breath.   atorvastatin 20 MG tablet Commonly known as: LIPITOR Take 20 mg by mouth daily.   carvedilol 3.125 MG tablet Commonly known as: COREG Take 2 tablets (6.25 mg total) by  mouth 2 (two) times daily with a meal. What changed: medication strength   cyanocobalamin 1000 MCG tablet Take 1 tablet (1,000 mcg total) by mouth daily. Start taking on: March 23, 2021   docusate sodium 100 MG capsule Commonly known as: COLACE Take 1 capsule (100 mg total) by mouth every other day. Start taking on: March 23, 2021   ferrous sulfate 325 (65 FE) MG tablet Take 1 tablet (325 mg  total) by mouth every other day. Start taking on: March 23, 2021   Fluticasone-Salmeterol 250-50 MCG/DOSE Aepb Commonly known as: Advair Diskus INHALE 1 PUFF BY MOUTH INTO THE LUNGS TWICE DAILY   mirtazapine 7.5 MG tablet Commonly known as: REMERON Take 7.5 mg by mouth at bedtime.   pantoprazole 40 MG tablet Commonly known as: PROTONIX Take 1 tablet (40 mg total) by mouth 2 (two) times daily before a meal.   potassium chloride 10 MEQ CR capsule Commonly known as: MICRO-K Take 1 capsule (10 mEq total) by mouth daily as needed (take potasium if you take torsemide.). What changed:   when to take this  reasons to take this   sertraline 50 MG tablet Commonly known as: ZOLOFT Take by mouth.   Spiriva Respimat 2.5 MCG/ACT Aers Generic drug: Tiotropium Bromide Monohydrate INHALE 2 PUFFS INTO THE LUNGS DAILY   torsemide 20 MG tablet Commonly known as: DEMADEX Take 20 mg by mouth daily as needed.       Follow-up Information    Jasmine Ruths, MD Follow up in 1 week(s).   Specialty: Internal Medicine Why: you need lab work to follow Blood count level.  Contact information: [place] [address] [phone]        Jasmine Landsman, MD Follow up in 1 month(s).   Specialty: Gastroenterology Contact information: [place] [address] [phone]              Allergies  Allergen Reactions  . Mucinex D [Pseudoephedrine-Guaifenesin Er]     Rash on neck    Consultations:  gi   Procedures/Studies: DG Chest Portable 1 View  Result Date: 03/20/2021 CLINICAL DATA:  Worsening shortness of breath over the past 3 days. EXAM: PORTABLE CHEST 1 VIEW COMPARISON:  Chest x-ray dated December 14, 2020. FINDINGS: The heart size and mediastinal contours are within normal limits. Normal pulmonary vascularity. The lungs remain hyperinflated with emphysematous changes and chronically coarsened  interstitial markings. Mild right basilar atelectasis/scarring. Possible trace right pleural effusion. No focal consolidation or pneumothorax. No acute osseous abnormality. IMPRESSION: 1. Possible trace right pleural effusion. 2. COPD. Electronically Signed   By: Titus Dubin M.D.   On: 03/20/2021 13:02      Subjective: She is feeling better, she was able to ambulate without significant shortness of breath.  Discharge Exam: Vitals:   03/21/21 2357 03/22/21 0433  BP: (!) 108/53 (!) 106/59  Pulse: 74 75  Resp: 20 20  Temp: 98.2 F (36.8 C) 98 F (36.7 C)  SpO2: 100% 100%     General: Pt is alert, awake, not in acute distress Cardiovascular: RRR, S1/S2 +, no rubs, no gallops Respiratory: CTA bilaterally, no wheezing, no rhonchi Abdominal: Soft, NT, ND, bowel sounds + Extremities: no edema, no cyanosis    The results of significant diagnostics from this hospitalization (including imaging, microbiology, ancillary and laboratory) are listed below for reference.     Microbiology: Recent Results (from the past 240 hour(s))  Resp Panel  by RT-PCR (Flu A&B, Covid) Nasopharyngeal Swab     Status: None   Collection Time: 03/20/21  2:56 PM   Specimen: Nasopharyngeal Swab; Nasopharyngeal(NP) swabs in vial transport medium  Result Value Ref Range Status   SARS Coronavirus 2 by RT PCR NEGATIVE NEGATIVE Final    Comment: (NOTE) SARS-CoV-2 target nucleic acids are NOT DETECTED.  The SARS-CoV-2 RNA is generally detectable in upper respiratory specimens during the acute phase of infection. The lowest concentration of SARS-CoV-2 viral copies this assay can detect is 138 copies/mL. A negative result does not preclude SARS-Cov-2 infection and should not be used as the sole basis for treatment or other patient management decisions. A negative result may occur with  improper specimen collection/handling, submission of specimen other than nasopharyngeal swab, presence of viral mutation(s)  within the areas targeted by this assay, and inadequate number of viral copies(<138 copies/mL). A negative result must be combined with clinical observations, patient history, and epidemiological information. The expected result is Negative.  Fact Sheet for Patients:  EntrepreneurPulse.com.au  Fact Sheet for Healthcare Providers:  IncredibleEmployment.be  This test is no t yet approved or cleared by the [place] FDA and  has been authorized for detection and/or diagnosis of SARS-CoV-2 by FDA under an Emergency Use Authorization (EUA). This EUA will remain  in effect (meaning this test can be used) for the duration of the COVID-19 declaration under Section 564(b)(1) of the Act, 21 U.S.C.section 360bbb-3(b)(1), unless the authorization is terminated  or revoked sooner.       Influenza A by PCR NEGATIVE NEGATIVE Final   Influenza B by PCR NEGATIVE NEGATIVE Final    Comment: (NOTE) The Xpert Xpress SARS-CoV-2/FLU/RSV plus assay is intended as an aid in the diagnosis of influenza from Nasopharyngeal swab specimens and should not be used as a sole basis for treatment. Nasal washings and aspirates are unacceptable for Xpert Xpress SARS-CoV-2/FLU/RSV testing.  Fact Sheet for Patients: EntrepreneurPulse.com.au  Fact Sheet for Healthcare Providers: IncredibleEmployment.be  This test is not yet approved or cleared by the [place] FDA and has been authorized for detection and/or diagnosis of SARS-CoV-2 by FDA under an Emergency Use Authorization (EUA). This EUA will remain in effect (meaning this test can be used) for the duration of the COVID-19 declaration under Section 564(b)(1) of the Act, 21 U.S.C. section 360bbb-3(b)(1), unless the authorization is terminated or revoked.  Performed at [facility], [address] [place], [place] [address]      Labs: BNP (last 3 results) Recent  Labs    12/14/20 1349 03/20/21 1207  BNP 376.5* 829.5*   Basic Metabolic Panel: Recent Labs  Lab 03/20/21 1207 03/21/21 0459 03/22/21 0443  NA 134* 139 141  K 5.0 4.6 4.6  CL 108 113* 116*  CO2 18* 20* 20*  GLUCOSE 143* 111* 106*  BUN 84* 64* 44*  CREATININE 2.24* 1.66* 1.35*  CALCIUM 10.3 9.6 10.1   Liver Function Tests: Recent Labs  Lab 03/20/21 1207  AST 11*  ALT 10  ALKPHOS 66  BILITOT 0.6  PROT 7.7  ALBUMIN 3.7   No results for input(s): LIPASE, AMYLASE in the last 168 hours. No results for input(s): AMMONIA in the last 168 hours. CBC: Recent Labs  Lab 03/20/21 1207 03/21/21 0459 03/22/21 0443  WBC 10.3 9.1 8.8  HGB 8.3* 8.3* 8.8*  HCT 25.7* 26.6* 27.9*  MCV 88.0 88.1 88.9  PLT 215 176 202   Cardiac Enzymes: No results for input(s): CKTOTAL, CKMB, CKMBINDEX, TROPONINI  in the last 168 hours. BNP: Invalid input(s): POCBNP CBG: Recent Labs  Lab 03/21/21 1625 03/21/21 2034 03/21/21 2357 03/22/21 0435 03/22/21 0800  GLUCAP 128* 136* 116* 107* 111*   D-Dimer No results for input(s): DDIMER in the last 72 hours. Hgb A1c Recent Labs    03/20/21 1456  HGBA1C 6.3*   Lipid Profile No results for input(s): CHOL, HDL, LDLCALC, TRIG, CHOLHDL, LDLDIRECT in the last 72 hours. Thyroid function studies No results for input(s): TSH, T4TOTAL, T3FREE, THYROIDAB in the last 72 hours.  Invalid input(s): FREET3 Anemia work up Recent Labs    03/20/21 1206 03/20/21 1456  VITAMINB12 205  --   FOLATE  --  6.4  FERRITIN  --  178  TIBC  --  235*  IRON  --  12*  RETICCTPCT  --  1.7   Urinalysis    Component Value Date/Time   COLORURINE STRAW (A) 12/14/2020 1625   APPEARANCEUR CLEAR (A) 12/14/2020 1625   APPEARANCEUR CLEAR 07/02/2013 1144   LABSPEC 1.009 12/14/2020 1625   LABSPEC 1.015 07/02/2013 1144   PHURINE 5.0 12/14/2020 1625   GLUCOSEU NEGATIVE 12/14/2020 1625   GLUCOSEU NEGATIVE 07/02/2013 1144   HGBUR SMALL (A) 12/14/2020 1625    BILIRUBINUR NEGATIVE 12/14/2020 1625   BILIRUBINUR NEGATIVE 07/02/2013 1144   KETONESUR NEGATIVE 12/14/2020 1625   PROTEINUR NEGATIVE 12/14/2020 1625   NITRITE NEGATIVE 12/14/2020 1625   LEUKOCYTESUR NEGATIVE 12/14/2020 1625   LEUKOCYTESUR NEGATIVE 07/02/2013 1144   Sepsis Labs Invalid input(s): PROCALCITONIN,  WBC,  LACTICIDVEN Microbiology Recent Results (from the past 240 hour(s))  Resp Panel by RT-PCR (Flu A&B, Covid) Nasopharyngeal Swab     Status: None   Collection Time: 03/20/21  2:56 PM   Specimen: Nasopharyngeal Swab; Nasopharyngeal(NP) swabs in vial transport medium  Result Value Ref Range Status   SARS Coronavirus 2 by RT PCR NEGATIVE NEGATIVE Final    Comment: (NOTE) SARS-CoV-2 target nucleic acids are NOT DETECTED.  The SARS-CoV-2 RNA is generally detectable in upper respiratory specimens during the acute phase of infection. The lowest concentration of SARS-CoV-2 viral copies this assay can detect is 138 copies/mL. A negative result does not preclude SARS-Cov-2 infection and should not be used as the sole basis for treatment or other patient management decisions. A negative result may occur with  improper specimen collection/handling, submission of specimen other than nasopharyngeal swab, presence of viral mutation(s) within the areas targeted by this assay, and inadequate number of viral copies(<138 copies/mL). A negative result must be combined with clinical observations, patient history, and epidemiological information. The expected result is Negative.  Fact Sheet for Patients:  EntrepreneurPulse.com.au  Fact Sheet for Healthcare Providers:  IncredibleEmployment.be  This test is no t yet approved or cleared by the [place] FDA and  has been authorized for detection and/or diagnosis of SARS-CoV-2 by FDA under an Emergency Use Authorization (EUA). This EUA will remain  in effect (meaning this test can be used) for the  duration of the COVID-19 declaration under Section 564(b)(1) of the Act, 21 U.S.C.section 360bbb-3(b)(1), unless the authorization is terminated  or revoked sooner.       Influenza A by PCR NEGATIVE NEGATIVE Final   Influenza B by PCR NEGATIVE NEGATIVE Final    Comment: (NOTE) The Xpert Xpress SARS-CoV-2/FLU/RSV plus assay is intended as an aid in the diagnosis of influenza from Nasopharyngeal swab specimens and should not be used as a sole basis for treatment. Nasal washings and aspirates are unacceptable for Xpert Xpress SARS-CoV-2/FLU/RSV  testing.  Fact Sheet for Patients: EntrepreneurPulse.com.au  Fact Sheet for Healthcare Providers: IncredibleEmployment.be  This test is not yet approved or cleared by the [place] FDA and has been authorized for detection and/or diagnosis of SARS-CoV-2 by FDA under an Emergency Use Authorization (EUA). This EUA will remain in effect (meaning this test can be used) for the duration of the COVID-19 declaration under Section 564(b)(1) of the Act, 21 U.S.C. section 360bbb-3(b)(1), unless the authorization is terminated or revoked.  Performed at [facility], [address], [place], [place] [address]      Time coordinating discharge: 40 minutes  SIGNED:   Elmarie Shiley, MD  [facility]

## 2021-03-22 NOTE — Plan of Care (Signed)
  Problem: Clinical Measurements: Goal: Diagnostic test results will improve Outcome: Adequate for Discharge Goal: Respiratory complications will improve Outcome: Adequate for Discharge   Problem: Activity: Goal: Risk for activity intolerance will decrease Outcome: Adequate for Discharge   Problem: Pain Managment: Goal: General experience of comfort will improve Outcome: Adequate for Discharge   Problem: Safety: Goal: Ability to remain free from injury will improve Outcome: Adequate for Discharge

## 2021-03-22 NOTE — Progress Notes (Signed)
OT Cancellation Note  Patient Details Name: KARAGAN LEHR MRN: [id number] DOB: Aug 21, 1941   Cancelled Treatment:    Reason Eval/Treat Not Completed: OT screened, no needs identified, will sign off. Consult received, chart reviewed. Spoke with PT who saw pt. Per PT, pt is back to baseline for ADL/mobility. Significant family support available. No skilled acute OT needs identified. Will sign off. Please re-consult if additional needs arise.   Hanley Hays, MPH, MS, OTR/L ascom [phone] 03/22/21, 11:14 AM

## 2021-03-22 NOTE — TOC Initial Note (Signed)
Transition of Care [facility]) - Initial/Assessment Note    Patient Details  Name: [name] MRN: [id number] Date of Birth: 28-Mar-1941  Transition of Care [facility]) CM/SW Contact:    Beverly Sessions, RN Phone Number: 03/22/2021, 11:49 AM  Clinical Narrative:                 Patient admitted from home with anemia Confirmed that patient does not have legal guardian.  Updated in epic  Patient lives at home with son and daughter in law Daughter in law to transport at discharge  PCP Ouida Sills -  Family takes to appointments Pharmacy [facility] - denies issues obtaining medications  Patient has chronic home o2 and wears 4L at baseline, Patient states that she has a RW and a WC.  Patient would like a BSC.  Referral made to patricia with [employer], and to be delivered prior to discharge today  PT has assessed patient and recommends home health PT.  Patient declines any home health services at discharge.  MD notified     Expected Discharge Plan: Home/Self Care Barriers to Discharge: No Barriers Identified   Patient Goals and CMS Choice Patient states their goals for this hospitalization and ongoing recovery are:: to go home today      Expected Discharge Plan and Services Expected Discharge Plan: Home/Self Care       Living arrangements for the past 2 months: Single Family Home Expected Discharge Date: 03/22/21               DME Arranged: 3-N-1 DME Agency: [employer] Date DME Agency Contacted: 03/22/21 Time DME Agency Contacted: 28 Representative spoke with at DME Agency: Dennard Schaumann Arranged: Patient Refused [place]          Prior Living Arrangements/Services Living arrangements for the past 2 months: [place] Lives with:: Adult Children Patient language and need for interpreter reviewed:: Yes Do you feel safe going back to the place where you live?: Yes      Need for Family Participation in Patient Care: Yes (Comment) Care giver support system in place?: Yes  (comment) Current home services: DME Criminal Activity/Legal Involvement Pertinent to Current Situation/Hospitalization: No - Comment as needed  Activities of Daily Living Home Assistive Devices/Equipment: Engineer, drilling (specify type) ADL Screening (condition at time of admission) Patient's cognitive ability adequate to safely complete daily activities?: Yes Is the patient deaf or have difficulty hearing?: No Does the patient have difficulty seeing, even when wearing glasses/contacts?: Yes Does the patient have difficulty concentrating, remembering, or making decisions?: Yes Patient able to express need for assistance with ADLs?: Yes Does the patient have difficulty dressing or bathing?: No Independently performs ADLs?: No Communication: Independent Dressing (OT): Needs assistance Is this a change from baseline?: Pre-admission baseline Grooming: Needs assistance Is this a change from baseline?: Pre-admission baseline Feeding: Independent Bathing: Needs assistance Is this a change from baseline?: Pre-admission baseline Toileting: Needs assistance Is this a change from baseline?: Pre-admission baseline In/Out Bed: Needs assistance Is this a change from baseline?: Pre-admission baseline Walks in Home: Independent with device (comment) (front wheel walker) Does the patient have difficulty walking or climbing stairs?: Yes Weakness of Legs: Both Weakness of Arms/Hands: Both  Permission Sought/Granted                  Emotional Assessment   Attitude/Demeanor/Rapport: Engaged Affect (typically observed): Accepting Orientation: : Oriented to Self,Oriented to Place,Oriented to  Time,Oriented to Situation Alcohol / Substance Use: Not Applicable Psych Involvement: No (comment)  Admission diagnosis:  Symptomatic anemia [D64.9] Dyspnea, unspecified type [R06.00] Anemia [D64.9] Patient Active Problem List   Diagnosis Date Noted  . Anemia 03/21/2021  . Symptomatic anemia  03/20/2021  . AKI (acute kidney injury) ([place]) 03/20/2021  . Protein-calorie malnutrition, severe 12/16/2020  . CHF (congestive heart failure) ([place]) 12/14/2020  . Pain due to onychomycosis of toenails of both feet 06/06/2020  . Onychomycosis 09/20/2016  . Health care maintenance 06/19/2016  . Back pain 05/12/2015  . Fatigue 10/13/2014  . Major depression in remission ([place]) 10/10/2014  . Type 2 diabetes mellitus with stage 3 chronic kidney disease ([place]) 06/08/2014  . Edema 05/29/2014  . HTN (hypertension), benign 05/29/2014  . Hyperlipidemia associated with type 2 diabetes mellitus ([place]) 05/29/2014  . UI (urinary incontinence) 05/29/2014  . Dyspnea 04/26/2014  . COPD (chronic obstructive pulmonary disease) ([place]) 03/25/2014  . Chronic respiratory failure ([place]) 03/25/2014   PCP:  [name], MD Pharmacy:   [facility] [phone] [address], [address] [place] [address] [place] [address] Phone: [phone] Fax: [phone]  [facility]) - [place], [place] [address] Phone: [phone] Fax: [phone]     Social Determinants of Health (SDOH) Interventions    Readmission Risk Interventions No flowsheet data found.

## 2021-03-23 ENCOUNTER — Other Ambulatory Visit: Payer: Self-pay

## 2021-03-23 MED ORDER — SPIRIVA RESPIMAT 2.5 MCG/ACT IN AERS: INHALATION_SPRAY | RESPIRATORY_TRACT | 3 refills | Status: DC

## 2021-03-23 NOTE — Telephone Encounter (Signed)
Rx for Spiriva 2.5 has been sent to [facility] as requested by pharmacy.

## 2021-03-29 DIAGNOSIS — J9610 Chronic respiratory failure, unspecified whether with hypoxia or hypercapnia: Secondary | ICD-10-CM | POA: Diagnosis not present

## 2021-03-29 DIAGNOSIS — Z9981 Dependence on supplemental oxygen: Secondary | ICD-10-CM | POA: Diagnosis not present

## 2021-03-29 DIAGNOSIS — E46 Unspecified protein-calorie malnutrition: Secondary | ICD-10-CM | POA: Diagnosis not present

## 2021-03-29 DIAGNOSIS — Z87891 Personal history of nicotine dependence: Secondary | ICD-10-CM | POA: Diagnosis not present

## 2021-03-29 DIAGNOSIS — N183 Chronic kidney disease, stage 3 unspecified: Secondary | ICD-10-CM | POA: Diagnosis not present

## 2021-03-29 DIAGNOSIS — Z681 Body mass index (BMI) 19 or less, adult: Secondary | ICD-10-CM | POA: Diagnosis not present

## 2021-03-29 DIAGNOSIS — J449 Chronic obstructive pulmonary disease, unspecified: Secondary | ICD-10-CM | POA: Diagnosis not present

## 2021-04-03 DIAGNOSIS — J9610 Chronic respiratory failure, unspecified whether with hypoxia or hypercapnia: Secondary | ICD-10-CM | POA: Diagnosis not present

## 2021-04-03 DIAGNOSIS — K922 Gastrointestinal hemorrhage, unspecified: Secondary | ICD-10-CM | POA: Diagnosis not present

## 2021-04-03 DIAGNOSIS — I10 Essential (primary) hypertension: Secondary | ICD-10-CM | POA: Diagnosis not present

## 2021-04-03 DIAGNOSIS — E1122 Type 2 diabetes mellitus with diabetic chronic kidney disease: Secondary | ICD-10-CM | POA: Diagnosis not present

## 2021-04-03 DIAGNOSIS — Z09 Encounter for follow-up examination after completed treatment for conditions other than malignant neoplasm: Secondary | ICD-10-CM | POA: Diagnosis not present

## 2021-04-03 DIAGNOSIS — N1831 Chronic kidney disease, stage 3a: Secondary | ICD-10-CM | POA: Diagnosis not present

## 2021-04-06 LAB — BLOOD GAS, VENOUS
Acid-base deficit: 8.6 mmol/L — ABNORMAL HIGH (ref 0.0–2.0)
Bicarbonate: 17.9 mmol/L — ABNORMAL LOW (ref 20.0–28.0)
O2 Saturation: 19.4 %
Patient temperature: 37
pCO2, Ven: 40 mmHg — ABNORMAL LOW (ref 44.0–60.0)
pH, Ven: 7.26 (ref 7.250–7.430)

## 2021-04-08 DIAGNOSIS — J449 Chronic obstructive pulmonary disease, unspecified: Secondary | ICD-10-CM | POA: Diagnosis not present

## 2021-04-19 DIAGNOSIS — I509 Heart failure, unspecified: Secondary | ICD-10-CM | POA: Diagnosis not present

## 2021-04-19 DIAGNOSIS — Z9981 Dependence on supplemental oxygen: Secondary | ICD-10-CM | POA: Diagnosis not present

## 2021-04-19 DIAGNOSIS — J9610 Chronic respiratory failure, unspecified whether with hypoxia or hypercapnia: Secondary | ICD-10-CM | POA: Diagnosis not present

## 2021-04-19 DIAGNOSIS — Z87891 Personal history of nicotine dependence: Secondary | ICD-10-CM | POA: Diagnosis not present

## 2021-04-19 DIAGNOSIS — J449 Chronic obstructive pulmonary disease, unspecified: Secondary | ICD-10-CM | POA: Diagnosis not present

## 2021-04-19 DIAGNOSIS — N183 Chronic kidney disease, stage 3 unspecified: Secondary | ICD-10-CM | POA: Diagnosis not present

## 2021-04-26 DIAGNOSIS — E1122 Type 2 diabetes mellitus with diabetic chronic kidney disease: Secondary | ICD-10-CM | POA: Diagnosis not present

## 2021-04-26 DIAGNOSIS — E785 Hyperlipidemia, unspecified: Secondary | ICD-10-CM | POA: Diagnosis not present

## 2021-04-26 DIAGNOSIS — M7989 Other specified soft tissue disorders: Secondary | ICD-10-CM | POA: Diagnosis not present

## 2021-04-26 DIAGNOSIS — E038 Other specified hypothyroidism: Secondary | ICD-10-CM | POA: Diagnosis not present

## 2021-04-26 DIAGNOSIS — I10 Essential (primary) hypertension: Secondary | ICD-10-CM | POA: Diagnosis not present

## 2021-04-26 DIAGNOSIS — F325 Major depressive disorder, single episode, in full remission: Secondary | ICD-10-CM | POA: Diagnosis not present

## 2021-04-26 DIAGNOSIS — J42 Unspecified chronic bronchitis: Secondary | ICD-10-CM | POA: Diagnosis not present

## 2021-04-26 DIAGNOSIS — M25571 Pain in right ankle and joints of right foot: Secondary | ICD-10-CM | POA: Diagnosis not present

## 2021-04-26 DIAGNOSIS — N1831 Chronic kidney disease, stage 3a: Secondary | ICD-10-CM | POA: Diagnosis not present

## 2021-04-26 DIAGNOSIS — G8929 Other chronic pain: Secondary | ICD-10-CM | POA: Diagnosis not present

## 2021-04-26 DIAGNOSIS — E1169 Type 2 diabetes mellitus with other specified complication: Secondary | ICD-10-CM | POA: Diagnosis not present

## 2021-04-27 DIAGNOSIS — N1831 Chronic kidney disease, stage 3a: Secondary | ICD-10-CM | POA: Diagnosis not present

## 2021-04-27 DIAGNOSIS — R0600 Dyspnea, unspecified: Secondary | ICD-10-CM | POA: Diagnosis not present

## 2021-04-27 DIAGNOSIS — N179 Acute kidney failure, unspecified: Secondary | ICD-10-CM | POA: Diagnosis not present

## 2021-04-27 DIAGNOSIS — E1122 Type 2 diabetes mellitus with diabetic chronic kidney disease: Secondary | ICD-10-CM | POA: Diagnosis not present

## 2021-04-27 DIAGNOSIS — E785 Hyperlipidemia, unspecified: Secondary | ICD-10-CM | POA: Diagnosis not present

## 2021-04-27 DIAGNOSIS — I5031 Acute diastolic (congestive) heart failure: Secondary | ICD-10-CM | POA: Diagnosis not present

## 2021-04-27 DIAGNOSIS — I10 Essential (primary) hypertension: Secondary | ICD-10-CM | POA: Diagnosis not present

## 2021-04-27 DIAGNOSIS — J42 Unspecified chronic bronchitis: Secondary | ICD-10-CM | POA: Diagnosis not present

## 2021-04-27 DIAGNOSIS — E1169 Type 2 diabetes mellitus with other specified complication: Secondary | ICD-10-CM | POA: Diagnosis not present

## 2021-05-02 DIAGNOSIS — S90511D Abrasion, right ankle, subsequent encounter: Secondary | ICD-10-CM | POA: Diagnosis not present

## 2021-05-03 ENCOUNTER — Ambulatory Visit: Payer: PPO | Admitting: Gastroenterology

## 2021-05-04 DIAGNOSIS — B351 Tinea unguium: Secondary | ICD-10-CM | POA: Diagnosis not present

## 2021-05-04 DIAGNOSIS — L03115 Cellulitis of right lower limb: Secondary | ICD-10-CM | POA: Diagnosis not present

## 2021-05-04 DIAGNOSIS — E1122 Type 2 diabetes mellitus with diabetic chronic kidney disease: Secondary | ICD-10-CM | POA: Diagnosis not present

## 2021-05-04 DIAGNOSIS — N1831 Chronic kidney disease, stage 3a: Secondary | ICD-10-CM | POA: Diagnosis not present

## 2021-05-04 DIAGNOSIS — M79674 Pain in right toe(s): Secondary | ICD-10-CM | POA: Diagnosis not present

## 2021-05-04 DIAGNOSIS — M79675 Pain in left toe(s): Secondary | ICD-10-CM | POA: Diagnosis not present

## 2021-05-08 DIAGNOSIS — J449 Chronic obstructive pulmonary disease, unspecified: Secondary | ICD-10-CM | POA: Diagnosis not present

## 2021-06-01 DIAGNOSIS — N1831 Chronic kidney disease, stage 3a: Secondary | ICD-10-CM | POA: Diagnosis not present

## 2021-06-01 DIAGNOSIS — S91001D Unspecified open wound, right ankle, subsequent encounter: Secondary | ICD-10-CM | POA: Diagnosis not present

## 2021-06-01 DIAGNOSIS — E1122 Type 2 diabetes mellitus with diabetic chronic kidney disease: Secondary | ICD-10-CM | POA: Diagnosis not present

## 2021-06-01 DIAGNOSIS — R7982 Elevated C-reactive protein (CRP): Secondary | ICD-10-CM | POA: Diagnosis not present

## 2021-06-08 DIAGNOSIS — J449 Chronic obstructive pulmonary disease, unspecified: Secondary | ICD-10-CM | POA: Diagnosis not present

## 2021-06-22 DIAGNOSIS — S91001D Unspecified open wound, right ankle, subsequent encounter: Secondary | ICD-10-CM | POA: Diagnosis not present

## 2021-06-22 DIAGNOSIS — M869 Osteomyelitis, unspecified: Secondary | ICD-10-CM | POA: Diagnosis not present

## 2021-06-22 DIAGNOSIS — R7982 Elevated C-reactive protein (CRP): Secondary | ICD-10-CM | POA: Diagnosis not present

## 2021-06-22 DIAGNOSIS — N1831 Chronic kidney disease, stage 3a: Secondary | ICD-10-CM | POA: Diagnosis not present

## 2021-06-22 DIAGNOSIS — E1122 Type 2 diabetes mellitus with diabetic chronic kidney disease: Secondary | ICD-10-CM | POA: Diagnosis not present

## 2021-06-23 ENCOUNTER — Other Ambulatory Visit: Payer: Self-pay | Admitting: Infectious Diseases

## 2021-06-23 ENCOUNTER — Other Ambulatory Visit (HOSPITAL_COMMUNITY): Payer: Self-pay | Admitting: Infectious Diseases

## 2021-06-23 DIAGNOSIS — N1831 Chronic kidney disease, stage 3a: Secondary | ICD-10-CM

## 2021-06-23 DIAGNOSIS — R7982 Elevated C-reactive protein (CRP): Secondary | ICD-10-CM

## 2021-06-23 DIAGNOSIS — M869 Osteomyelitis, unspecified: Secondary | ICD-10-CM

## 2021-06-23 DIAGNOSIS — E1122 Type 2 diabetes mellitus with diabetic chronic kidney disease: Secondary | ICD-10-CM

## 2021-06-23 DIAGNOSIS — S91001D Unspecified open wound, right ankle, subsequent encounter: Secondary | ICD-10-CM

## 2021-06-25 ENCOUNTER — Ambulatory Visit
Admission: RE | Admit: 2021-06-25 | Discharge: 2021-06-25 | Disposition: A | Discharge status: Home / Self Care | Payer: PPO | Source: Ambulatory Visit | Attending: Infectious Diseases | Admitting: Infectious Diseases

## 2021-06-25 DIAGNOSIS — S91001A Unspecified open wound, right ankle, initial encounter: Secondary | ICD-10-CM | POA: Diagnosis not present

## 2021-06-25 DIAGNOSIS — N1831 Chronic kidney disease, stage 3a: Secondary | ICD-10-CM | POA: Insufficient documentation

## 2021-06-25 DIAGNOSIS — M869 Osteomyelitis, unspecified: Secondary | ICD-10-CM | POA: Diagnosis not present

## 2021-06-25 DIAGNOSIS — M19071 Primary osteoarthritis, right ankle and foot: Secondary | ICD-10-CM | POA: Diagnosis not present

## 2021-06-25 DIAGNOSIS — E1122 Type 2 diabetes mellitus with diabetic chronic kidney disease: Secondary | ICD-10-CM | POA: Diagnosis not present

## 2021-06-25 DIAGNOSIS — R7982 Elevated C-reactive protein (CRP): Secondary | ICD-10-CM | POA: Diagnosis not present

## 2021-06-25 DIAGNOSIS — M65871 Other synovitis and tenosynovitis, right ankle and foot: Secondary | ICD-10-CM | POA: Diagnosis not present

## 2021-06-25 DIAGNOSIS — S91001D Unspecified open wound, right ankle, subsequent encounter: Secondary | ICD-10-CM | POA: Insufficient documentation

## 2021-06-25 DIAGNOSIS — E119 Type 2 diabetes mellitus without complications: Secondary | ICD-10-CM | POA: Diagnosis not present

## 2021-07-08 DIAGNOSIS — J449 Chronic obstructive pulmonary disease, unspecified: Secondary | ICD-10-CM | POA: Diagnosis not present

## 2021-07-18 DIAGNOSIS — R7982 Elevated C-reactive protein (CRP): Secondary | ICD-10-CM | POA: Diagnosis not present

## 2021-07-18 DIAGNOSIS — E43 Unspecified severe protein-calorie malnutrition: Secondary | ICD-10-CM | POA: Diagnosis not present

## 2021-07-18 DIAGNOSIS — E1122 Type 2 diabetes mellitus with diabetic chronic kidney disease: Secondary | ICD-10-CM | POA: Diagnosis not present

## 2021-07-18 DIAGNOSIS — N1831 Chronic kidney disease, stage 3a: Secondary | ICD-10-CM | POA: Diagnosis not present

## 2021-07-18 DIAGNOSIS — S91001D Unspecified open wound, right ankle, subsequent encounter: Secondary | ICD-10-CM | POA: Diagnosis not present

## 2021-08-01 ENCOUNTER — Ambulatory Visit: Payer: PPO | Admitting: Internal Medicine

## 2021-08-02 ENCOUNTER — Other Ambulatory Visit: Payer: Self-pay

## 2021-08-02 ENCOUNTER — Ambulatory Visit (INDEPENDENT_AMBULATORY_CARE_PROVIDER_SITE_OTHER): Payer: PPO | Admitting: Internal Medicine

## 2021-08-02 ENCOUNTER — Encounter: Payer: Self-pay | Admitting: Internal Medicine

## 2021-08-02 VITALS — BP 92/60 | HR 74 | Temp 97.7°F | Ht 60.0 in | Wt 90.8 lb

## 2021-08-02 DIAGNOSIS — J449 Chronic obstructive pulmonary disease, unspecified: Secondary | ICD-10-CM | POA: Diagnosis not present

## 2021-08-02 DIAGNOSIS — J9611 Chronic respiratory failure with hypoxia: Secondary | ICD-10-CM

## 2021-08-02 DIAGNOSIS — R0689 Other abnormalities of breathing: Secondary | ICD-10-CM | POA: Diagnosis not present

## 2021-08-02 NOTE — Progress Notes (Signed)
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 (24/7) Mild obesity   DATA: Spirometry 2014: FEV1 0.78 liters (38% pred)  CT chest 07/06/13: mod - severe emphysema. Chronic RLL scarring CXR 05/12/15: Advanced chronic obstructive pulmonary disease with similar right basilar scarring. No acute findings CXR 04/04/17: No acute findings   INTERVAL HISTORY: Last visit 02/26/19.  No major pulmonary events since that time.   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  Patient continues to take Advair and Spiriva as prescribed  seems to be responding well to this regimen    No exacerbation at this time No evidence of heart failure at this time No evidence or signs of infection at this time No respiratory distress No fevers, chills, nausea, vomiting, diarrhea No evidence of lower extremity edema No evidence hemoptysis   Chronic Hypoxic resp failure due to COPD -Patient benefits from oxygen therapy 2L [place]  -recommend using oxygen as prescribed -patient needs this for survival    Patient with  high risk for pulmonary complications for any type of procedure moving forward  Patient with a history of acute diastolic heart failure with pulmonary edema and CHF exacerbation in the past Sees Dr. Ubaldo Glassing with cardiology   Review of Systems:  Gen:  Denies  fever, sweats, chills weight loss  HEENT: Denies blurred vision, double vision, ear pain, eye pain, hearing loss, nose bleeds, sore throat Cardiac:  No dizziness, chest pain or heaviness, chest tightness,edema, No JVD Resp:   +cough, -sputum production, =shortness of breath,-wheezing, -hemoptysis,  Gi: Denies swallowing difficulty, stomach pain, nausea or vomiting, diarrhea, constipation, bowel incontinence Other:  All other systems  negative    BP 92/60 (BP Location: Left Arm, Patient Position: Sitting, Cuff Size: Normal)   Pulse 74   Temp 97.7 F (36.5 C) (Oral)   Ht 5' (1.524 m)   Wt 90 lb 12.8 oz (41.2 kg)   SpO2 95%   BMI 17.73 kg/m    Physical Examination:   General Appearance: No distress, thin and very frail EYES PERRLA, EOM intact.   NECK Supple, No JVD Pulmonary: normal breath sounds, No wheezing.  CardiovascularNormal S1,S2.  No m/r/g.   Abdomen: Benign, Soft, non-tender. Skin:   warm, no rashes, no ecchymosis  Extremities: normal, no cyanosis, clubbing. Neuro:without focal findings,  speech normal  PSYCHIATRIC: Mood, affect within normal limits.   ALL OTHER ROS ARE NEGATIVE      Current Outpatient Medications:    acetaminophen (TYLENOL) 500 MG tablet, Take 500 mg by mouth every 6 (six) hours as needed. 2 in am and 2 at night, Disp: , Rfl:    albuterol (PROAIR HFA) 108 (90 Base) MCG/ACT inhaler, Inhale 2 puffs into the lungs every 6 (six) hours as needed for wheezing or shortness of breath., Disp: 54 g, Rfl: 1   atorvastatin (LIPITOR) 20 MG tablet, Take 20 mg by mouth daily., Disp: , Rfl:    carvedilol (COREG) 3.125 MG tablet, Take 2 tablets (6.25 mg total) by mouth 2 (two) times daily with a meal., Disp: 30 tablet, Rfl: 1   docusate sodium (COLACE) 100 MG capsule, Take 1 capsule (100 mg total) by mouth every other day., Disp: 10 capsule, Rfl: 0   ferrous sulfate 325 (65 FE) MG tablet, Take 1 tablet (325 mg total) by mouth every other day., Disp: 30 tablet, Rfl: 3  Fluticasone-Salmeterol (ADVAIR DISKUS) 250-50 MCG/DOSE AEPB, INHALE 1 PUFF BY MOUTH INTO THE LUNGS TWICE DAILY, Disp: 180 each, Rfl: 3   mirtazapine (REMERON) 7.5 MG tablet, Take 7.5 mg by mouth at bedtime., Disp: , Rfl:    pantoprazole (PROTONIX) 40 MG tablet, Take 1 tablet (40 mg total) by mouth 2 (two) times daily before a meal., Disp: 60 tablet, Rfl: 1   potassium chloride (MICRO-K) 10 MEQ CR capsule, Take 1 capsule (10 mEq  total) by mouth daily as needed (take potasium if you take torsemide.)., Disp: 10 capsule, Rfl: 0   sertraline (ZOLOFT) 50 MG tablet, Take by mouth., Disp: , Rfl:    Tiotropium Bromide Monohydrate (SPIRIVA RESPIMAT) 2.5 MCG/ACT AERS, INHALE 2 PUFFS INTO THE LUNGS DAILY, Disp: 12 g, Rfl: 3   torsemide (DEMADEX) 20 MG tablet, Take 20 mg by mouth daily as needed., Disp: , Rfl:    vitamin B-12 1000 MCG tablet, Take 1 tablet (1,000 mcg total) by mouth daily., Disp: 30 tablet, Rfl: 2 No current facility-administered medications for this visit.  Facility-Administered Medications Ordered in Other Visits:    albuterol (PROVENTIL) (2.5 MG/3ML) 0.083% nebulizer solution 2.5 mg, 2.5 mg, Nebulization, Once, Wilhelmina Mcardle, MD     IMPRESSION:  80 year old pleasant white female thin and frail with severe end-stage COPD with FEV1 of 30% 6 years ago Gold stage D with severe respiratory insufficiency chronic hypoxic respiratory failure from COPD and chronic bronchitis and emphysema   Severe end-stage COPD  Gold stage D No exacerbation at this time eems to be stable at this time  Patient has emphysema and chronic bronchitis  Review respiratory insufficiency Continue Advair and Spiriva as prescribed Albuterol as needed  Chronic bronchitis and emphysema Pulmonary Hygiene continue flutter valve 10-15 times per day   Chronic Hypoxic resp failure due to COPD -Patient benefits from oxygen therapy 2L [place]  -recommend using oxygen as prescribed -patient needs this for survival  diastolic heart failure with pulm edema and CHF exacerbation Follow-up with Dr. Billie Ruddy cardiology    Surgical preop assessment Patient is a moderate to high risk for postop and Intra-Op complications due to her Lung disease  I have discussed that there is always a increased risk Pulmonary Infection, increased chance of Respiratory Failure and Cardiac Arrest, increased chance of pneumothorax and collapsed lung, as well as  increased Stroke and Death. I have explained Risks to patient   At this time, Patient is at optimal medical management.   Patient has VERY HIGH  risk for postop complications Patient is optimized with her medical therapy  Preoperative Pulmonary Risk Assessment Definite risk factors for post-operative pulmonary complications include age >53, COPD, CHF, ASA class >2, functional dependence, OSA, pulmonary HTN, baseline hypoxia, poor nutritional status, surgery >3 hours, emergency surgery and high-risk surgical sites (AAA, upper abdomen, thoracic, head/neck). - ABG is unlikely to aid in pre-op evaluation  General Risk Reduction Strategies: - All patients warrant post-operative incentive spirometry. For those with obstruction, also consider flutter valve. - Early ambulation, PT/OT - DVT prophylaxis where appropriate - Adequate pain control without oversedation     MEDICATION ADJUSTMENTS/LABS AND TESTS ORDERED: Continue inhalers as prescribed Continue oxygen as prescribed high risk postop and Intra-Op complications Recommend Flutter Valve 10-15 times per day  CURRENT MEDICATIONS REVIEWED AT LENGTH WITH PATIENT TODAY   Patient satisfied with Plan of action and management. All questions answered  Follow-up 6 months  Total time spent 25 minutes    Corrin Parker, M.D.  [facility]  Medical Director [place] Director [facility] Cardio-Pulmonary Department

## 2021-08-02 NOTE — Patient Instructions (Addendum)
Continue inhalers as prescribed Continue oxygen as prescribed Recommend Flutter Valve 10-15 times per day

## 2021-08-08 DIAGNOSIS — J449 Chronic obstructive pulmonary disease, unspecified: Secondary | ICD-10-CM | POA: Diagnosis not present

## 2021-09-05 DIAGNOSIS — I5032 Chronic diastolic (congestive) heart failure: Secondary | ICD-10-CM | POA: Diagnosis not present

## 2021-09-05 DIAGNOSIS — F325 Major depressive disorder, single episode, in full remission: Secondary | ICD-10-CM | POA: Diagnosis not present

## 2021-09-05 DIAGNOSIS — E785 Hyperlipidemia, unspecified: Secondary | ICD-10-CM | POA: Diagnosis not present

## 2021-09-05 DIAGNOSIS — I10 Essential (primary) hypertension: Secondary | ICD-10-CM | POA: Diagnosis not present

## 2021-09-05 DIAGNOSIS — L97311 Non-pressure chronic ulcer of right ankle limited to breakdown of skin: Secondary | ICD-10-CM | POA: Diagnosis not present

## 2021-09-05 DIAGNOSIS — E1169 Type 2 diabetes mellitus with other specified complication: Secondary | ICD-10-CM | POA: Diagnosis not present

## 2021-09-05 DIAGNOSIS — E1122 Type 2 diabetes mellitus with diabetic chronic kidney disease: Secondary | ICD-10-CM | POA: Diagnosis not present

## 2021-09-05 DIAGNOSIS — N1831 Chronic kidney disease, stage 3a: Secondary | ICD-10-CM | POA: Diagnosis not present

## 2021-09-05 DIAGNOSIS — J42 Unspecified chronic bronchitis: Secondary | ICD-10-CM | POA: Diagnosis not present

## 2021-09-08 DIAGNOSIS — R234 Changes in skin texture: Secondary | ICD-10-CM | POA: Diagnosis not present

## 2021-09-08 DIAGNOSIS — Z9981 Dependence on supplemental oxygen: Secondary | ICD-10-CM | POA: Diagnosis not present

## 2021-09-08 DIAGNOSIS — J9610 Chronic respiratory failure, unspecified whether with hypoxia or hypercapnia: Secondary | ICD-10-CM | POA: Diagnosis not present

## 2021-09-08 DIAGNOSIS — Z681 Body mass index (BMI) 19 or less, adult: Secondary | ICD-10-CM | POA: Diagnosis not present

## 2021-09-08 DIAGNOSIS — J449 Chronic obstructive pulmonary disease, unspecified: Secondary | ICD-10-CM | POA: Diagnosis not present

## 2021-09-08 DIAGNOSIS — Z993 Dependence on wheelchair: Secondary | ICD-10-CM | POA: Diagnosis not present

## 2021-09-08 DIAGNOSIS — I509 Heart failure, unspecified: Secondary | ICD-10-CM | POA: Diagnosis not present

## 2021-09-08 DIAGNOSIS — Z87891 Personal history of nicotine dependence: Secondary | ICD-10-CM | POA: Diagnosis not present

## 2021-09-21 ENCOUNTER — Encounter: Payer: PPO | Attending: Physician Assistant | Admitting: Physician Assistant

## 2021-09-21 ENCOUNTER — Other Ambulatory Visit: Payer: Self-pay

## 2021-09-21 DIAGNOSIS — I5042 Chronic combined systolic (congestive) and diastolic (congestive) heart failure: Secondary | ICD-10-CM | POA: Diagnosis not present

## 2021-09-21 DIAGNOSIS — L97312 Non-pressure chronic ulcer of right ankle with fat layer exposed: Secondary | ICD-10-CM | POA: Diagnosis not present

## 2021-09-21 DIAGNOSIS — J449 Chronic obstructive pulmonary disease, unspecified: Secondary | ICD-10-CM | POA: Diagnosis not present

## 2021-09-21 DIAGNOSIS — Z9981 Dependence on supplemental oxygen: Secondary | ICD-10-CM | POA: Insufficient documentation

## 2021-09-21 DIAGNOSIS — E11622 Type 2 diabetes mellitus with other skin ulcer: Secondary | ICD-10-CM | POA: Insufficient documentation

## 2021-09-21 DIAGNOSIS — L97319 Non-pressure chronic ulcer of right ankle with unspecified severity: Secondary | ICD-10-CM | POA: Diagnosis present

## 2021-09-21 NOTE — Progress Notes (Signed)
RANYAH, GROENEVELD ([id number]) Visit Report for 09/21/2021 Chief Complaint Document Details Patient Name: [name], [name]. Date of Service: 09/21/2021 12:45 PM Medical Record Number: [id number] Patient Account Number: [id number] Date of Birth/Sex: Jun 06, 1941 (80 y.o. F) Treating RN: Dolan Amen Primary Care Provider: Frazier Richards Other Clinician: Referring Provider: Frazier Richards Treating Provider/Extender: Skipper Cliche in Treatment: 0 Information Obtained from: Patient Chief Complaint Right lateral ankle ulcer Electronic Signature(s) Signed: 09/21/2021 1:39:31 PM By: Worthy Keeler PA-C Entered By: Worthy Keeler on 09/21/2021 13:39:30 [name] ([id number]) -------------------------------------------------------------------------------- Debridement Details Patient Name: [name]. Date of Service: 09/21/2021 12:45 PM Medical Record Number: [id number] Patient Account Number: [id number] Date of Birth/Sex: [birth year] (80 y.o. F) Treating RN: Dolan Amen Primary Care Provider: Frazier Richards Other Clinician: Referring Provider: Frazier Richards Treating Provider/Extender: Skipper Cliche in Treatment: 0 Debridement Performed for Wound #1 Right,Lateral Malleolus Assessment: Performed By: Physician Tommie Sams., PA-C Debridement Type: Debridement Severity of Tissue Pre Debridement: Fat layer exposed Level of Consciousness (Pre- Awake and Alert procedure): Pre-procedure Verification/Time Out Yes - 13:44 Taken: Start Time: 13:44 Total Area Debrided (L x W): 0.5 (cm) x 0.4 (cm) = 0.2 (cm) Tissue and other material Viable, Non-Viable, Slough, Subcutaneous, Slough debrided: Level: Skin/Subcutaneous Tissue Debridement Description: Excisional Instrument: Curette Bleeding: Minimum Hemostasis Achieved: Pressure Response to Treatment: Procedure was tolerated well Level of Consciousness (Post- Awake and Alert procedure): Post  Debridement Measurements of Total Wound Length: (cm) 0.5 Width: (cm) 0.4 Depth: (cm) 0.2 Volume: (cm) 0.031 Character of Wound/Ulcer Post Debridement: Stable Severity of Tissue Post Debridement: Fat layer exposed Post Procedure Diagnosis Same as Pre-procedure Electronic Signature(s) Signed: 09/21/2021 2:21:21 PM By: Worthy Keeler PA-C Signed: 09/21/2021 3:00:07 PM By: Dolan Amen RN Entered By: Dolan Amen on 09/21/2021 13:45:19 [name] ([id number]) -------------------------------------------------------------------------------- HPI Details Patient Name: [name]. Date of Service: 09/21/2021 12:45 PM Medical Record Number: [id number] Patient Account Number: [id number] Date of Birth/Sex: 1941-12-26 (80 y.o. F) Treating RN: Dolan Amen Primary Care Provider: Frazier Richards Other Clinician: Referring Provider: Frazier Richards Treating Provider/Extender: Skipper Cliche in Treatment: 0 History of Present Illness HPI Description: 09/21/2021 upon evaluation today patient appears to be doing somewhat poorly in regard to her wound on her right lateral ankle. She has been tolerating the dressing changes without complication. Fortunately there does not not appear to be any signs of active infection systemically at this time which is great news. Likely also do not see any major issues which is good news. Nonetheless the biggest problem is that even with attempting to treat this and try to get things under control on her own she is really not been able to get this healed. She does tend to sleep on her right side exclusively due to the fact that she is not able to lay any other way due to pain. She has issues with her right hip. She is a diabetic her last hemoglobin A1c was 5.6 however this should not be affecting her ability to heal she also had an MRI in July which was negative for osteomyelitis. Overall everything checks out okay but nonetheless she is still  experiencing the open wound that she just cannot get closed. She does wear oxygen 24/7. She has a history of congestive heart failure, COPD, and again the diabetes mellitus type 2. Electronic Signature(s) Signed: 09/21/2021 1:54:02 PM By: Worthy Keeler PA-C Entered By: Worthy Keeler on 09/21/2021 13:54:02 [name] ([id number]) -------------------------------------------------------------------------------- Physical Exam  Details Patient Name: [name], [name]. Date of Service: 09/21/2021 12:45 PM Medical Record Number: [id number] Patient Account Number: [id number] Date of Birth/Sex: 14-Aug-1941 (80 y.o. F) Treating RN: Dolan Amen Primary Care Provider: Frazier Richards Other Clinician: Referring Provider: Frazier Richards Treating Provider/Extender: Skipper Cliche in Treatment: 0 Constitutional sitting or standing blood pressure is within target range for patient.. pulse regular and within target range for patient.Marland Kitchen respirations regular, non- labored and within target range for patient.Marland Kitchen temperature within target range for patient.. Well-nourished and well-hydrated in no acute distress. Eyes conjunctiva clear no eyelid edema noted. pupils equal round and reactive to light and accommodation. Ears, Nose, Mouth, and Throat no gross abnormality of ear auricles or external auditory canals. normal hearing noted during conversation. mucus membranes moist. Respiratory normal breathing without difficulty. Cardiovascular 2+ dorsalis pedis/posterior tibialis pulses. no clubbing, cyanosis, significant edema, <3 sec cap refill. Musculoskeletal normal gait and posture. no significant deformity or arthritic changes, no loss or range of motion, no clubbing. Psychiatric this patient is able to make decisions and demonstrates good insight into disease process. Alert and Oriented x 3. pleasant and cooperative. Notes Upon inspection patient's wound bed actually showed some necrotic  tissue noted on the surface of the wound including some slough and eschar. I did actually perform sharp debridement to clear away the necrotic debris here. She had some discomfort once I was able to remove this and get the numbing medication on it things calm down she felt significantly better. Fortunately there does not appear to be any signs of infection which is great and the MolecuLight fluorescence imaging also appear to be negative today which was great news this was both pre and postdebridement. Electronic Signature(s) Signed: 09/21/2021 1:54:40 PM By: Worthy Keeler PA-C Entered By: Worthy Keeler on 09/21/2021 13:54:39 [name] ([id number]) -------------------------------------------------------------------------------- Physician Orders Details Patient Name: [name]. Date of Service: 09/21/2021 12:45 PM Medical Record Number: [id number] Patient Account Number: [id number] Date of Birth/Sex: 1941-06-04 (80 y.o. F) Treating RN: Dolan Amen Primary Care Provider: Frazier Richards Other Clinician: Referring Provider: Frazier Richards Treating Provider/Extender: Skipper Cliche in Treatment: 0 Verbal / Phone Orders: No Diagnosis Coding ICD-10 Coding Code Description E11.622 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 J44.9 Chronic obstructive pulmonary disease, unspecified I50.42 Chronic combined systolic (congestive) and diastolic (congestive) heart failure Z99.81 Dependence on supplemental oxygen Follow-up Appointments o Return Appointment in 1 week. Bathing/ Shower/ Hygiene o May shower; gently cleanse wound with antibacterial soap, rinse and pat dry prior to dressing wounds Wound Treatment Wound #1 - Malleolus Wound Laterality: Right, Lateral Cleanser: Byram Ancillary Kit - 15 Day Supply (DME) (Generic) 3 x Per Week/30 Days Discharge Instructions: Use supplies as  instructed; Kit contains: (15) Saline Bullets; (15) 3x3 Gauze; 15 pr Gloves Cleanser: Wound Cleanser 3 x Per Week/30 Days Discharge Instructions: Wash your hands with soap and water. Remove old dressing, discard into plastic bag and place into trash. Cleanse the wound with Wound Cleanser prior to applying a clean dressing using gauze sponges, not tissues or cotton balls. Do not scrub or use excessive force. Pat dry using gauze sponges, not tissue or cotton balls. Primary Dressing: Prisma 4.34 (in) 3 x Per Week/30 Days Discharge Instructions: Moisten w/normal saline or sterile water; Cover wound as directed. Do not remove from wound bed. Primary Dressing: Zetuvit Plus Silicone Border Dressing 4x4 (in/in) (DME) (Generic) 3 x Per Week/30 Days Discharge  Instructions: Secure dressing Electronic Signature(s) Signed: 09/21/2021 2:21:21 PM By: Worthy Keeler PA-C Signed: 09/21/2021 3:00:07 PM By: Dolan Amen RN Entered By: Dolan Amen on 09/21/2021 13:56:25 [name] ([id number]) -------------------------------------------------------------------------------- Problem List Details Patient Name: [name]. Date of Service: 09/21/2021 12:45 PM Medical Record Number: [id number] Patient Account Number: [id number] Date of Birth/Sex: 04-02-1941 (80 y.o. F) Treating RN: Dolan Amen Primary Care Provider: Frazier Richards Other Clinician: Referring Provider: Frazier Richards Treating Provider/Extender: Jeri Cos Weeks in Treatment: 0 Active Problems ICD-10 Encounter Code Description Active Date MDM Diagnosis E11.622 Type 2 diabetes mellitus with other skin ulcer 09/21/2021 No Yes L97.312 Non-pressure chronic ulcer of right ankle with fat layer exposed 09/21/2021 No Yes J44.9 Chronic obstructive pulmonary disease, unspecified 09/21/2021 No Yes I50.42 Chronic combined systolic (congestive) and diastolic (congestive) heart 09/21/2021 No Yes failure Z99.81 Dependence on  supplemental oxygen 09/21/2021 No Yes Inactive Problems Resolved Problems Electronic Signature(s) Signed: 09/21/2021 1:38:36 PM By: Worthy Keeler PA-C Entered By: Worthy Keeler on 09/21/2021 13:38:36 [name] ([id number]) -------------------------------------------------------------------------------- Progress Note Details Patient Name: [name]. Date of Service: 09/21/2021 12:45 PM Medical Record Number: [id number] Patient Account Number: [id number] Date of Birth/Sex: 1941-09-28 (80 y.o. F) Treating RN: Dolan Amen Primary Care Provider: Frazier Richards Other Clinician: Referring Provider: Frazier Richards Treating Provider/Extender: Skipper Cliche in Treatment: 0 Subjective Chief Complaint Information obtained from Patient Right lateral ankle ulcer History of Present Illness (HPI) 09/21/2021 upon evaluation today patient appears to be doing somewhat poorly in regard to her wound on her right lateral ankle. She has been tolerating the dressing changes without complication. Fortunately there does not not appear to be any signs of active infection systemically at this time which is great news. Likely also do not see any major issues which is good news. Nonetheless the biggest problem is that even with attempting to treat this and try to get things under control on her own she is really not been able to get this healed. She does tend to sleep on her right side exclusively due to the fact that she is not able to lay any other way due to pain. She has issues with her right hip. She is a diabetic her last hemoglobin A1c was 5.6 however this should not be affecting her ability to heal she also had an MRI in July which was negative for osteomyelitis. Overall everything checks out okay but nonetheless she is still experiencing the open wound that she just cannot get closed. She does wear oxygen 24/7. She has a history of congestive heart failure, COPD, and again the  diabetes mellitus type 2. Patient History Information obtained from Patient. Allergies No Known Drug Allergies Social History Former smoker. Medical History Respiratory Patient has history of Chronic Obstructive Pulmonary Disease (COPD) Cardiovascular Patient has history of Congestive Heart Failure Endocrine Patient has history of Type II Diabetes Denies history of Type I Diabetes Neurologic Denies history of Seizure Disorder Patient is treated with Controlled Diet. Review of Systems (ROS) Constitutional Symptoms (General Health) Denies complaints or symptoms of Fatigue, Fever, Chills, Marked Weight Change. Eyes Denies complaints or symptoms of Dry Eyes, Vision Changes, Glasses / Contacts. Ear/Nose/Mouth/Throat Denies complaints or symptoms of Difficult clearing ears, Sinusitis. Hematologic/Lymphatic Denies complaints or symptoms of Bleeding / Clotting Disorders, Human Immunodeficiency Virus. Respiratory Complains or has symptoms of Shortness of Breath. Denies complaints or symptoms of Chronic or frequent coughs. Cardiovascular Denies complaints or symptoms of Chest pain, LE edema. Gastrointestinal Denies  complaints or symptoms of Frequent diarrhea, Nausea, Vomiting. Endocrine Denies complaints or symptoms of Hepatitis, Thyroid disease, Polydypsia (Excessive Thirst). Genitourinary Denies complaints or symptoms of Kidney failure/ Dialysis, Incontinence/dribbling. Immunological Denies complaints or symptoms of Hives, Itching. Integumentary (Skin) Complains or has symptoms of Wounds. Denies complaints or symptoms of Bleeding or bruising tendency, Breakdown, Swelling. Musculoskeletal Denies complaints or symptoms of Muscle Pain, Muscle Weakness. [name], [name] ([id number]) Neurologic Denies complaints or symptoms of Numbness/parasthesias, Focal/Weakness. Psychiatric Denies complaints or symptoms of Anxiety, Claustrophobia. Objective Constitutional sitting or  standing blood pressure is within target range for patient.. pulse regular and within target range for patient.Marland Kitchen respirations regular, non- labored and within target range for patient.Marland Kitchen temperature within target range for patient.. Well-nourished and well-hydrated in no acute distress. Vitals Time Taken: 1:04 PM, Height: 65 in, Source: Stated, Weight: 98 lbs, Source: Stated, BMI: 16.3, Temperature: 98.2 F, Pulse: 79 bpm, Respiratory Rate: 18 breaths/min, Blood Pressure: 122/67 mmHg, Pulse Oximetry: 87 %. Eyes conjunctiva clear no eyelid edema noted. pupils equal round and reactive to light and accommodation. Ears, Nose, Mouth, and Throat no gross abnormality of ear auricles or external auditory canals. normal hearing noted during conversation. mucus membranes moist. Respiratory normal breathing without difficulty. Cardiovascular 2+ dorsalis pedis/posterior tibialis pulses. no clubbing, cyanosis, significant edema, Musculoskeletal normal gait and posture. no significant deformity or arthritic changes, no loss or range of motion, no clubbing. Psychiatric this patient is able to make decisions and demonstrates good insight into disease process. Alert and Oriented x 3. pleasant and cooperative. General Notes: Upon inspection patient's wound bed actually showed some necrotic tissue noted on the surface of the wound including some slough and eschar. I did actually perform sharp debridement to clear away the necrotic debris here. She had some discomfort once I was able to remove this and get the numbing medication on it things calm down she felt significantly better. Fortunately there does not appear to be any signs of infection which is great and the MolecuLight fluorescence imaging also appear to be negative today which was great news this was both pre and postdebridement. Integumentary (Hair, Skin) Wound #1 status is Open. Original cause of wound was Gradually Appeared. The date acquired was:  05/21/2021. The wound is located on the Right,Lateral Malleolus. The wound measures 0.5cm length x 0.4cm width x 0.1cm depth; 0.157cm^2 area and 0.016cm^3 volume. There is Fat Layer (Subcutaneous Tissue) exposed. There is no tunneling or undermining noted. There is a medium amount of sanguinous drainage noted. There is large (67-100%) red granulation within the wound bed. There is no necrotic tissue within the wound bed. 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 There was a Excisional Skin/Subcutaneous Tissue Debridement with a total area of 0.2 sq cm performed by Tommie Sams., PA-C. With the following instrument(s): Curette to remove Viable and Non-Viable tissue/material. Material removed includes Subcutaneous Tissue and Slough and. A time out was conducted at 13:44, prior to the start of the procedure. A Minimum amount of bleeding was controlled with Pressure. The procedure was tolerated well. Post Debridement Measurements: 0.5cm length x 0.4cm width x 0.2cm  depth; 0.031cm^3 volume. Character of Wound/Ulcer Post Debridement is stable. Severity of Tissue Post Debridement is: Fat layer exposed. Post procedure Diagnosis Wound #1: Same as Pre-Procedure Plan Follow-up Appointments: Return Appointment in 1 week. Bathing/ Shower/ Hygiene: May shower; gently cleanse wound with antibacterial soap, rinse and pat dry prior to dressing wounds WOUND #1: - Malleolus Wound Laterality: Right,  Lateral Cleanser: Byram Ancillary Kit - 15 Day Supply (Generic) 3 x Per Week/30 Days Discharge Instructions: Use supplies as instructed; Kit contains: (15) Saline Bullets; (15) 3x3 Gauze; 15 pr Gloves Cleanser: Wound Cleanser 3 x Per Week/30 Days Discharge Instructions: Wash your hands with soap and water. Remove old dressing, discard into plastic bag and place into trash. Cleanse the wound with Wound Cleanser prior to applying a clean dressing using gauze sponges, not tissues or cotton balls. Do not scrub or use excessive force. Pat dry using gauze sponges, not tissue or cotton balls. Primary Dressing: Prisma 4.34 (in) 3 x Per Week/30 Days Discharge Instructions: Moisten w/normal saline or sterile water; Cover wound as directed. Do not remove from wound bed. Primary Dressing: Zetuvit Plus Silicone Border Dressing 4x4 (in/in) 3 x Per Week/30 Days Discharge Instructions: Secure dressing 1. Based on what I am seeing currently I really feel like that pressure is the main issue that we are experiencing at this time. I did discuss with the patient that I think she needs to try to do what she can to offload the ankle region when she is lying down. I think that the biggest issue here is simply doing something like a doughnut pillow that she can use a roll gauze to secure in place around her ankle to keep pressure off I think that will be beneficial especially since she always lays on this right side I definitely think that is the culprit for what is going on here. 2. I am also can recommend at this time that we have the patient continue with the silver collagen dressing as the optimal dressing of choice. I think that coupled with a Zetuvit border foam will be beneficial for her as well. We will see patient back for reevaluation in 1 week here in the clinic. If anything worsens or changes patient will contact our office for additional recommendations. MolecuLight DX: 1st Scanned Wound The following wound  was scanned with MolecuLight DX): Right lateral ankle ulcer Fluorescence bacterial imaging was medically necessary today due to Initial Evaluation of the wound with MolecuLightDX to determine (Indication): baseline bacterial bioburden level Green Colors, There was no fluorescence suggestive of a high bacterial MolecuLight Results load on todayos scan. As a result of todayos scan, the following treatment plans were put in No intervention necessary as fluorescence imaging was negative place. MolecuLight Procedure The MolecularLight DX device was cleaned with a disinfectant wipe prior to use., The correct patient profile was confirmed and correct wound was verified., Range finder sensor used to ensure appropriate distance The following was completed: selected between imaging unit and wound bed, Room lights were turned off and the ambient light sensor was checked., Blue circle appeared around the lightbulb., The fluorescence icon was selected. Screen was tapped to enhance focus and the image was captured. Additional drapes were used to ensure adequate darknesso No Additional Scanned Wounds Did you scan any additional Woundso No Electronic Signature(s) Signed: 09/21/2021 1:55:57 PM By: Worthy Keeler PA-C Entered By: Worthy Keeler on 09/21/2021 13:55:56 [name] ([id number]) [name], [name] ([id number]) -------------------------------------------------------------------------------- ROS/PFSH Details Patient Name: Jasmine Kidney  M. Date of Service: 09/21/2021 12:45 PM Medical Record Number: [id number] Patient Account Number: [id number] Date of Birth/Sex: [birth year] (80 y.o. F) Treating RN: Dolan Amen Primary Care Provider: Frazier Richards Other Clinician: Referring Provider: Frazier Richards Treating Provider/Extender: Skipper Cliche in Treatment: 0 Information Obtained From Patient Constitutional Symptoms (General Health) Complaints and Symptoms: Negative  for: Fatigue; Fever; Chills; Marked Weight Change Eyes Complaints and Symptoms: Negative for: Dry Eyes; Vision Changes; Glasses / Contacts Ear/Nose/Mouth/Throat Complaints and Symptoms: Negative for: Difficult clearing ears; Sinusitis Hematologic/Lymphatic Complaints and Symptoms: Negative for: Bleeding / Clotting Disorders; Human Immunodeficiency Virus Respiratory Complaints and Symptoms: Positive for: Shortness of Breath Negative for: Chronic or frequent coughs Medical History: Positive for: Chronic Obstructive Pulmonary Disease (COPD) Cardiovascular Complaints and Symptoms: Negative for: Chest pain; LE edema Medical History: Positive for: Congestive Heart Failure Gastrointestinal Complaints and Symptoms: Negative for: Frequent diarrhea; Nausea; Vomiting Endocrine Complaints and Symptoms: Negative for: Hepatitis; Thyroid disease; Polydypsia (Excessive Thirst) Medical History: Positive for: Type II Diabetes Negative for: Type I Diabetes Treated with: Diet Genitourinary [name], KREGER. ([id number]) Complaints and Symptoms: Negative for: Kidney failure/ Dialysis; Incontinence/dribbling Immunological Complaints and Symptoms: Negative for: Hives; Itching Integumentary (Skin) Complaints and Symptoms: Positive for: Wounds Negative for: Bleeding or bruising tendency; Breakdown; Swelling Musculoskeletal Complaints and Symptoms: Negative for: Muscle Pain; Muscle Weakness Neurologic Complaints and Symptoms: Negative for: Numbness/parasthesias; Focal/Weakness Medical History: Negative for: Seizure Disorder Psychiatric Complaints and Symptoms: Negative for: Anxiety; Claustrophobia Oncologic Immunizations Pneumococcal Vaccine: Received Pneumococcal Vaccination: Yes Received Pneumococcal Vaccination On or After 60th Birthday: Yes Implantable Devices No devices added Family and Social History Former smoker Engineer, maintenance) Signed: 09/21/2021 2:21:21 PM By:  Worthy Keeler PA-C Signed: 09/21/2021 3:00:07 PM By: Dolan Amen RN Entered By: Dolan Amen on 09/21/2021 13:10:29 [name] ([id number]) -------------------------------------------------------------------------------- [place] Details Patient Name: [name]. Date of Service: 09/21/2021 Medical Record Number: [id number] Patient Account Number: [id number] Date of Birth/Sex: 22-May-1941 (80 y.o. F) Treating RN: Dolan Amen Primary Care Provider: Frazier Richards Other Clinician: Referring Provider: Frazier Richards Treating Provider/Extender: Skipper Cliche in Treatment: 0 Diagnosis Coding ICD-10 Codes Code Description E11.622 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 J44.9 Chronic obstructive pulmonary disease, unspecified I50.42 Chronic combined systolic (congestive) and diastolic (congestive) heart failure Z99.81 Dependence on supplemental oxygen Facility Procedures CPT4 Code: [id number] Description: 99213 - WOUND CARE VISIT-LEV 3 EST PT Modifier: Quantity: 1 CPT4 Code: [id number] Description: 11042 - DEB SUBQ TISSUE 20 SQ CM/< Modifier: Quantity: 1 CPT4 Code: Description: ICD-10 Diagnosis Description L97.312 Non-pressure chronic ulcer of right ankle with fat layer exposed Modifier: Quantity: CPT4 Code: 48270786 Description: NONCNTACT RT FLORO WND 1ST STE (CDM [id number]) Modifier: Quantity: 1 CPT4 Code: Description: ICD-10 Diagnosis Description L97.312 Non-pressure chronic ulcer of right ankle with fat layer exposed Modifier: Quantity: Physician Procedures CPT4 Code: 0071219 Description: WC PHYS LEVEL 3 o NEW PT Modifier: 25 Quantity: 1 CPT4 Code: Description: ICD-10 Diagnosis Description E11.622 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 J44.9 Chronic obstructive pulmonary disease,  unspecified 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 Modifier: ailure Quantity: CPT4 Code: 7588325 Description: 11042 - WC PHYS SUBQ TISS 20 SQ CM Modifier: Quantity: 1 CPT4 Code: Description: ICD-10 Diagnosis Description L97.312 Non-pressure chronic ulcer of right ankle with fat layer exposed Modifier: Quantity: CPT4 Code: 4982M Description: HC MOLECULIGHT WND IMG 1ST  ANATOMIC SITE Modifier: Quantity: 1 CPT4 Code: Description: ICD-10 Diagnosis Description N05.397 Non-pressure chronic ulcer of right ankle with fat layer exposed Modifier: Quantity: Electronic Signature(s) Signed: 09/21/2021 3:59:13 PM By: Gretta Cool, BSN, RN, CWS, Kim RN, BSN Previous Signature: 09/21/2021 2:04:59 PM Version By: Worthy Keeler PA-C Previous Signature: 09/21/2021 1:56:26 PM Version By: Worthy Keeler PA-C Entered By: Gretta Cool BSN, RN, CWS, Kim on 09/21/2021 15:59:13

## 2021-09-21 NOTE — Progress Notes (Signed)
BEOLA, VASALLO ([id number]) Visit Report for 09/21/2021 Abuse/Suicide Risk Screen Details Patient Name: [name], [name]. Date of Service: 09/21/2021 12:45 PM Medical Record Number: [id number] Patient Account Number: [id number] Date of Birth/Sex: [birth year] (80 y.o. F) Treating RN: Dolan Amen Primary Care Hasini Peachey: Frazier Richards Other Clinician: Referring El Pile: Frazier Richards Treating Yazid Pop/Extender: Skipper Cliche in Treatment: 0 Abuse/Suicide Risk Screen Items Answer ABUSE RISK SCREEN: Has anyone close to you tried to hurt or harm you recentlyo No Do you feel uncomfortable with anyone in your familyo No Has anyone forced you do things that you didnot want to doo No Electronic Signature(s) Signed: 09/21/2021 3:00:07 PM By: Dolan Amen RN Entered By: Dolan Amen on 09/21/2021 13:10:34 [name] ([id number]) -------------------------------------------------------------------------------- Activities of Daily Living Details Patient Name: [name]. Date of Service: 09/21/2021 12:45 PM Medical Record Number: [id number] Patient Account Number: [id number] Date of Birth/Sex: 04/08/1941 (80 y.o. F) Treating RN: Dolan Amen Primary Care Tomio Kirk: Frazier Richards Other Clinician: Referring Kyran Connaughton: Frazier Richards Treating Jemma Rasp/Extender: Skipper Cliche in Treatment: 0 Activities of Daily Living Items Answer Activities of Daily Living (Please select one for each item) Drive Automobile Not Able Take Medications Completely Able Use Telephone Completely Able Care for Appearance Completely Able Use Toilet Completely Able Bath / Shower Completely Able Dress Self Completely Able Feed Self Completely Able Walk Completely Able Get In / Out Bed Completely Able Housework Completely Able Prepare Meals Completely Able Handle Money Completely Able Shop for Self Completely Able Electronic Signature(s) Signed: 09/21/2021 3:00:07 PM  By: Dolan Amen RN Entered By: Dolan Amen on 09/21/2021 13:11:01 [name] ([id number]) -------------------------------------------------------------------------------- Education Screening Details Patient Name: [name]. Date of Service: 09/21/2021 12:45 PM Medical Record Number: [id number] Patient Account Number: [id number] Date of Birth/Sex: January 23, 1941 (80 y.o. F) Treating RN: Dolan Amen Primary Care Mahmood Boehringer: Frazier Richards Other Clinician: Referring Larosa Rhines: Frazier Richards Treating Lyne Khurana/Extender: Skipper Cliche in Treatment: 0 Primary Learner Assessed: Patient Learning Preferences/Education Level/Primary Language Learning Preference: Explanation, Demonstration Highest Education Level: High School Preferred Language: English Cognitive Barrier Language Barrier: No Translator Needed: No Memory Deficit: No Emotional Barrier: No Physical Barrier Impaired Vision: No Impaired Hearing: No Decreased Hand dexterity: No Knowledge/Comprehension Knowledge Level: Medium Comprehension Level: Medium Ability to understand written instructions: Medium Ability to understand verbal instructions: Medium Motivation Anxiety Level: Calm Cooperation: Cooperative Education Importance: Acknowledges Need Interest in Health Problems: Asks Questions Perception: Coherent Willingness to Engage in Self-Management Medium Activities: Readiness to Engage in Self-Management Medium Activities: Electronic Signature(s) Signed: 09/21/2021 3:00:07 PM By: Dolan Amen RN Entered By: Dolan Amen on 09/21/2021 13:11:46 [name] ([id number]) -------------------------------------------------------------------------------- Fall Risk Assessment Details Patient Name: [name]. Date of Service: 09/21/2021 12:45 PM Medical Record Number: [id number] Patient Account Number: [id number] Date of Birth/Sex: 03-Jan-1941 (80 y.o. F) Treating RN: Dolan Amen Primary Care Marget Outten: Frazier Richards Other Clinician: Referring Aashvi Rezabek: Frazier Richards Treating Jeania Nater/Extender: Skipper Cliche in Treatment: 0 Fall Risk Assessment Items Have you had 2 or more falls in the last 12 monthso 0 Yes Have you had any fall that resulted in injury in the last 12 monthso 0 No FALLS RISK SCREEN History of falling - immediate or within 3 months 0 No Secondary diagnosis (Do you have 2 or more medical diagnoseso) 15 Yes Ambulatory aid None/bed rest/wheelchair/nurse 0 Yes Crutches/cane/walker 0 No Furniture 0 No Intravenous therapy Access/Saline/Heparin Lock 0 No Gait/Transferring Normal/ bed rest/ wheelchair 0 Yes Weak (short steps with  or without shuffle, stooped but able to lift head while walking, may 0 No seek support from furniture) Impaired (short steps with shuffle, may have difficulty arising from chair, head down, impaired 0 No balance) Mental Status Oriented to own ability 0 Yes Electronic Signature(s) Signed: 09/21/2021 3:00:07 PM By: Dolan Amen RN Entered By: Dolan Amen on 09/21/2021 13:12:12 [name] ([id number]) -------------------------------------------------------------------------------- Foot Assessment Details Patient Name: [name]. Date of Service: 09/21/2021 12:45 PM Medical Record Number: [id number] Patient Account Number: [id number] Date of Birth/Sex: [birth year] (80 y.o. F) Treating RN: Dolan Amen Primary Care Johnny Latu: Frazier Richards Other Clinician: Referring Ha Shannahan: Frazier Richards Treating Courteney Alderete/Extender: Skipper Cliche in Treatment: 0 Foot Assessment Items Site Locations + = Sensation present, - = Sensation absent, C = Callus, U = Ulcer R = Redness, W = Warmth, M = Maceration, PU = Pre-ulcerative lesion F = Fissure, S = Swelling, D = Dryness Assessment Right: Left: Other Deformity: No No Prior Foot Ulcer: No No Prior Amputation: No No Charcot Joint: No  No Ambulatory Status: Ambulatory With Help Assistance Device: Walker Gait: Electronic Signature(s) Signed: 09/21/2021 3:00:07 PM By: Dolan Amen RN Entered By: Dolan Amen on 09/21/2021 13:14:58 [name] ([id number]) -------------------------------------------------------------------------------- Nutrition Risk Screening Details Patient Name: [name]. Date of Service: 09/21/2021 12:45 PM Medical Record Number: [id number] Patient Account Number: [id number] Date of Birth/Sex: 1941/05/20 (80 y.o. F) Treating RN: Dolan Amen Primary Care Zamiya Dillard: Frazier Richards Other Clinician: Referring Yanelli Zapanta: Frazier Richards Treating Laiyla Slagel/Extender: Jeri Cos Weeks in Treatment: 0 Height (in): Weight (lbs): Body Mass Index (BMI): Nutrition Risk Screening Items Score Screening NUTRITION RISK SCREEN: I have an illness or condition that made me change the kind and/or amount of food I eat 0 No I eat fewer than two meals per day 0 No I eat few fruits and vegetables, or milk products 0 No I have three or more drinks of beer, liquor or wine almost every day 0 No I have tooth or mouth problems that make it hard for me to eat 0 No I don't always have enough money to buy the food I need 0 No I eat alone most of the time 0 No I take three or more different prescribed or over-the-counter drugs a day 1 Yes Without wanting to, I have lost or gained 10 pounds in the last six months 0 No I am not always physically able to shop, cook and/or feed myself 0 No Nutrition Protocols Good Risk Protocol 0 No interventions needed Moderate Risk Protocol High Risk Proctocol Risk Level: Good Risk Score: 1 Electronic Signature(s) Signed: 09/21/2021 3:00:07 PM By: Dolan Amen RN Entered By: Dolan Amen on 09/21/2021 13:12:20

## 2021-09-21 NOTE — Progress Notes (Signed)
[name], [name] ([id number]) Visit Report for 09/21/2021 Allergy List Details Patient Name: [name], [name]. Date of Service: 09/21/2021 12:45 PM Medical Record Number: [id number] Patient Account Number: [id number] Date of Birth/Sex: [birth year] (80 y.o. F) Treating RN: Dolan Amen Primary Care Samson Ralph: Frazier Richards Other Clinician: Referring Cesario Weidinger: Frazier Richards Treating Carzell Saldivar/Extender: Jeri Cos Weeks in Treatment: 0 Allergies Active Allergies No Known Drug Allergies Allergy Notes Electronic Signature(s) Signed: 09/21/2021 3:00:07 PM By: Dolan Amen RN Entered By: Dolan Amen on 09/21/2021 13:08:08 [name] ([id number]) -------------------------------------------------------------------------------- Arrival Information Details Patient Name: [name]. Date of Service: 09/21/2021 12:45 PM Medical Record Number: [id number] Patient Account Number: [id number] Date of Birth/Sex: Jan 12, 1941 (80 y.o. F) Treating RN: Dolan Amen Primary Care Khiree Bukhari: Frazier Richards Other Clinician: Referring Klaus Casteneda: Frazier Richards Treating Dashawna Delbridge/Extender: Skipper Cliche in Treatment: 0 Visit Information Patient Arrived: Wheel Chair Arrival Time: 13:03 Accompanied By: granddaughter Transfer Assistance: Manual Patient Identification Verified: Yes Secondary Verification Process Completed: Yes Electronic Signature(s) Signed: 09/21/2021 3:00:07 PM By: Dolan Amen RN Entered By: Dolan Amen on 09/21/2021 13:04:02 [name] ([id number]) -------------------------------------------------------------------------------- Clinic Level of Care Assessment Details Patient Name: [name]. Date of Service: 09/21/2021 12:45 PM Medical Record Number: [id number] Patient Account Number: [id number] Date of Birth/Sex: Nov 17, 1941 (80 y.o. F) Treating RN: Cornell Barman Primary Care Brandye Inthavong: Frazier Richards Other  Clinician: Referring Joyelle Siedlecki: Frazier Richards Treating Valery Amedee/Extender: Skipper Cliche in Treatment: 0 Clinic Level of Care Assessment Items TOOL 1 Quantity Score []  - Use when EandM and Procedure is performed on INITIAL visit 0 ASSESSMENTS - Nursing Assessment / Reassessment X - General Physical Exam (combine w/ comprehensive assessment (listed just below) when performed on new 1 20 pt. evals) X- 1 25 Comprehensive Assessment (HX, ROS, Risk Assessments, Wounds Hx, etc.) ASSESSMENTS - Wound and Skin Assessment / Reassessment []  - Dermatologic / Skin Assessment (not related to wound area) 0 ASSESSMENTS - Ostomy and/or Continence Assessment and Care []  - Incontinence Assessment and Management 0 []  - 0 Ostomy Care Assessment and Management (repouching, etc.) PROCESS - Coordination of Care X - Simple Patient / Family Education for ongoing care 1 15 []  - 0 Complex (extensive) Patient / Family Education for ongoing care X- 1 10 Staff obtains Consents, Records, Test Results / Process Orders []  - 0 Staff telephones HHA, Nursing Homes / Clarify orders / etc []  - 0 Routine Transfer to another Facility (non-emergent condition) []  - 0 Routine Hospital Admission (non-emergent condition) X- 1 15 New Admissions / Biomedical engineer / Ordering NPWT, Apligraf, etc. []  - 0 Emergency Hospital Admission (emergent condition) PROCESS - Special Needs []  - Pediatric / Minor Patient Management 0 []  - 0 Isolation Patient Management []  - 0 Hearing / Language / Visual special needs []  - 0 Assessment of Community assistance (transportation, D/C planning, etc.) []  - 0 Additional assistance / Altered mentation []  - 0 Support Surface(s) Assessment (bed, cushion, seat, etc.) INTERVENTIONS - Miscellaneous []  - External ear exam 0 []  - 0 Patient Transfer (multiple staff / Civil Service fast streamer / Similar devices) []  - 0 Simple Staple / Suture removal (25 or less) []  - 0 Complex Staple / Suture  removal (26 or more) []  - 0 Hypo/Hyperglycemic Management (do not check if billed separately) X- 1 15 Ankle / Brachial Index (ABI) - do not check if billed separately Has the patient been seen at the hospital within the last three years: Yes Total Score: 100 Level Of Care: New/Established - Level 3  [name], [name] ([id number]) Electronic Signature(s) Unsigned Entered By: Gretta Cool, BSN, RN, CWS, Kim on 09/21/2021 15:58:18 Signature(s): Date(s): [name] ([id number]) -------------------------------------------------------------------------------- Lower Extremity Assessment Details Patient Name: [name], [name]. Date of Service: 09/21/2021 12:45 PM Medical Record Number: [id number] Patient Account Number: [id number] Date of Birth/Sex: 1941/11/26 (80 y.o. F) Treating RN: Dolan Amen Primary Care Inika Bellanger: Frazier Richards Other Clinician: Referring Verlia Kaney: Frazier Richards Treating Archita Lomeli/Extender: Jeri Cos Weeks in Treatment: 0 Edema Assessment Assessed: [Left: No] [Right: Yes] Edema: [Left: Ye] [Right: s] Vascular Assessment Pulses: Dorsalis Pedis Palpable: [Right:Yes] Doppler Audible: [Right:Yes] Posterior Tibial Palpable: [Right:Yes] Doppler Audible: [Right:Yes] Blood Pressure: Brachial: [Right:122] Dorsalis Pedis: 118 Ankle: Posterior Tibial: 122 Ankle Brachial Index: [Right:1.00] Electronic Signature(s) Signed: 09/21/2021 3:00:07 PM By: Dolan Amen RN Entered By: Dolan Amen on 09/21/2021 13:32:47 [name] ([id number]) -------------------------------------------------------------------------------- Multi Wound Chart Details Patient Name: [name]. Date of Service: 09/21/2021 12:45 PM Medical Record Number: [id number] Patient Account Number: [id number] Date of Birth/Sex: 12/17/1941 (80 y.o. F) Treating RN: Dolan Amen Primary Care Lauralyn Shadowens: Frazier Richards Other Clinician: Referring Bitha Fauteux: Frazier Richards Treating Shaniqwa Horsman/Extender: Skipper Cliche in Treatment: 0 Vital Signs Height(in): 65 Pulse(bpm): 79 Weight(lbs): 98 Blood Pressure(mmHg): 122/67 Body Mass Index(BMI): 16 Temperature(F): 98.2 Respiratory Rate(breaths/min): 18 Photos: [N/A:N/A] Wound Location: Right, Lateral Malleolus N/A N/A Wounding Event: Gradually Appeared N/A N/A Primary Etiology: Diabetic Wound/Ulcer of the Lower N/A N/A Extremity Comorbid History: Chronic Obstructive Pulmonary N/A N/A Disease (COPD), Congestive Heart Failure, Type II Diabetes Date Acquired: 05/21/2021 N/A N/A Weeks of Treatment: 0 N/A N/A Wound Status: Open N/A N/A Measurements L x W x D (cm) 0.5x0.4x0.1 N/A N/A Area (cm) : 0.157 N/A N/A Volume (cm) : 0.016 N/A N/A % Reduction in Area: 0.00% N/A N/A % Reduction in Volume: 0.00% N/A N/A Classification: Unable to visualize wound bed N/A N/A Exudate Amount: Medium N/A N/A Exudate Type: Serous N/A N/A Exudate Color: amber N/A N/A Granulation Amount: None Present (0%) N/A N/A Necrotic Amount: Large (67-100%) N/A N/A Exposed Structures: Fat Layer (Subcutaneous Tissue): N/A N/A Yes Fascia: No Tendon: No Muscle: No Joint: No Bone: No Epithelialization: None N/A N/A Treatment Notes [name], [name] ([id number]) Electronic Signature(s) Signed: 09/21/2021 3:00:07 PM By: Dolan Amen RN Entered By: Dolan Amen on 09/21/2021 13:44:29 [name] ([id number]) -------------------------------------------------------------------------------- [place] Details Patient Name: [name]. Date of Service: 09/21/2021 12:45 PM Medical Record Number: [id number] Patient Account Number: [id number] Date of Birth/Sex: April 02, 1941 (80 y.o. F) Treating RN: Dolan Amen Primary Care Nalee Lightle: Frazier Richards Other Clinician: Referring Ever Halberg: Frazier Richards Treating Anjel Pardo/Extender: Skipper Cliche in Treatment: 0 Active  Inactive Necrotic Tissue Nursing Diagnoses: Impaired tissue integrity related to necrotic/devitalized tissue Goals: Necrotic/devitalized tissue will be minimized in the wound bed Date Initiated: 09/21/2021 Target Resolution Date: 09/21/2021 Goal Status: Active Patient/caregiver will verbalize understanding of reason and process for debridement of necrotic tissue Date Initiated: 09/21/2021 Target Resolution Date: 09/21/2021 Goal Status: Active Interventions: Assess patient pain level pre-, during and post procedure and prior to discharge Provide education on necrotic tissue and debridement process Treatment Activities: Apply topical anesthetic as ordered : 09/21/2021 Biologic debridement : 09/21/2021 Enzymatic debridement : 09/21/2021 Excisional debridement : 09/21/2021 Notes: Orientation to the Wound Care Program Nursing Diagnoses: Knowledge deficit related to the wound healing center program Goals: Patient/caregiver will verbalize understanding of the [place] Date Initiated: 09/21/2021 Target Resolution Date: 09/21/2021 Goal Status: Active Interventions: Provide education on orientation to the wound  center Notes: Wound/Skin Impairment Nursing Diagnoses: Impaired tissue integrity Goals: Patient/caregiver will verbalize understanding of skin care regimen Date Initiated: 09/21/2021 Target Resolution Date: 09/21/2021 Goal Status: Active Ulcer/skin breakdown will have a volume reduction of 30% by week 4 Date Initiated: 09/21/2021 Target Resolution Date: 10/21/2021 Goal Status: Active Ulcer/skin breakdown will have a volume reduction of 50% by week 8 Date Initiated: 09/21/2021 Target Resolution Date: 11/21/2021 [name], [name] ([id number]) Goal Status: Active Ulcer/skin breakdown will have a volume reduction of 80% by week 12 Date Initiated: 09/21/2021 Target Resolution Date: 12/21/2021 Goal Status: Active Ulcer/skin breakdown will heal within 14 weeks Date  Initiated: 09/21/2021 Target Resolution Date: 01/21/2022 Goal Status: Active Interventions: Assess patient/caregiver ability to obtain necessary supplies Assess patient/caregiver ability to perform ulcer/skin care regimen upon admission and as needed Assess ulceration(s) every visit Provide education on ulcer and skin care Treatment Activities: Referred to DME Kasi Lasky for dressing supplies : 09/21/2021 Skin care regimen initiated : 09/21/2021 Notes: Electronic Signature(s) Signed: 09/21/2021 3:00:07 PM By: Dolan Amen RN Entered By: Dolan Amen on 09/21/2021 13:43:47 [name] ([id number]) -------------------------------------------------------------------------------- Pain Assessment Details Patient Name: [name]. Date of Service: 09/21/2021 12:45 PM Medical Record Number: [id number] Patient Account Number: [id number] Date of Birth/Sex: 26-Dec-1941 (80 y.o. F) Treating RN: Dolan Amen Primary Care Tamsin Nader: Frazier Richards Other Clinician: Referring Evette Diclemente: Frazier Richards Treating Rahman Ferrall/Extender: Skipper Cliche in Treatment: 0 Active Problems Location of Pain Severity and Description of Pain Patient Has Paino No Site Locations Rate the pain. Current Pain Level: 0 Pain Management and Medication Current Pain Management: Electronic Signature(s) Signed: 09/21/2021 3:00:07 PM By: Dolan Amen RN Entered By: Dolan Amen on 09/21/2021 13:04:13 [name] ([id number]) -------------------------------------------------------------------------------- Wound Assessment Details Patient Name: [name]. Date of Service: 09/21/2021 12:45 PM Medical Record Number: [id number] Patient Account Number: [id number] Date of Birth/Sex: 12-02-1941 (80 y.o. F) Treating RN: Dolan Amen Primary Care Adeena Bernabe: Frazier Richards Other Clinician: Referring Quinnetta Roepke: Frazier Richards Treating Chasmine Lender/Extender: Skipper Cliche in  Treatment: 0 Wound Status Wound Number: 1 Primary Diabetic Wound/Ulcer of the Lower Extremity Etiology: Wound Location: Right, Lateral Malleolus Wound Open Wounding Event: Gradually Appeared Status: Date Acquired: 05/21/2021 Comorbid Chronic Obstructive Pulmonary Disease (COPD), Weeks Of Treatment: 0 History: Congestive Heart Failure, Type II Diabetes Clustered Wound: No Photos Wound Measurements Length: (cm) 0.5 % Re Width: (cm) 0.4 % Re Depth: (cm) 0.1 Epit Area: (cm) 0.157 Tun Volume: (cm) 0.016 Und duction in Area: 0% duction in Volume: 0% helialization: None neling: No ermining: No Wound Description Classification: Grade 1 Foul Exudate Amount: Medium Slou Exudate Type: Sanguinous Exudate Color: red Odor After Cleansing: No gh/Fibrino No Wound Bed Granulation Amount: Large (67-100%) Exposed Structure Granulation Quality: Red Fascia Exposed: No Necrotic Amount: None Present (0%) Fat Layer (Subcutaneous Tissue) Exposed: Yes Tendon Exposed: No Muscle Exposed: No Joint Exposed: No Bone Exposed: No [name], [name] ([id number]) Electronic Signature(s) Signed: 09/21/2021 3:00:07 PM By: Dolan Amen RN Entered By: Dolan Amen on 09/21/2021 13:49:45 [name] ([id number]) -------------------------------------------------------------------------------- Vitals Details Patient Name: [name]. Date of Service: 09/21/2021 12:45 PM Medical Record Number: [id number] Patient Account Number: [id number] Date of Birth/Sex: 1941/11/27 (80 y.o. F) Treating RN: Dolan Amen Primary Care Lucus Lambertson: Frazier Richards Other Clinician: Referring Tyasia Packard: Frazier Richards Treating Dusten Ellinwood/Extender: Skipper Cliche in Treatment: 0 Vital Signs Time Taken: 13:04 Temperature (F): 98.2 Height (in): 65 Pulse (bpm): 79 Source: Stated Respiratory Rate (breaths/min): 18 Weight (lbs): 98 Blood Pressure (mmHg):  122/67 Source: Stated Reference Range:  80 - 120 mg / dl Body Mass Index (BMI): 16.3 Airway Pulse Oximetry (%): 87 Inhaled Oxygen Concentration (%): 2 Electronic Signature(s) Signed: 09/21/2021 3:00:07 PM By: Dolan Amen RN Entered By: Dolan Amen on 09/21/2021 13:32:58

## 2021-09-22 DIAGNOSIS — E11622 Type 2 diabetes mellitus with other skin ulcer: Secondary | ICD-10-CM | POA: Diagnosis not present

## 2021-09-29 ENCOUNTER — Other Ambulatory Visit: Payer: Self-pay

## 2021-09-29 ENCOUNTER — Encounter: Payer: PPO | Admitting: Physician Assistant

## 2021-09-29 DIAGNOSIS — L97312 Non-pressure chronic ulcer of right ankle with fat layer exposed: Secondary | ICD-10-CM | POA: Diagnosis not present

## 2021-09-29 DIAGNOSIS — E11622 Type 2 diabetes mellitus with other skin ulcer: Secondary | ICD-10-CM | POA: Diagnosis not present

## 2021-09-29 NOTE — Progress Notes (Addendum)
TAJAH, NOGUCHI ([id number]) Visit Report for 09/29/2021 Chief Complaint Document Details Patient Name: [name], [name]. Date of Service: 09/29/2021 12:30 PM Medical Record Number: [id number] Patient Account Number: [id number] Date of Birth/Sex: 14-Jun-1941 (80 y.o. F) Treating RN: Carlene Coria Primary Care Provider: Frazier Richards Other Clinician: Referring Provider: Frazier Richards Treating Provider/Extender: Skipper Cliche in Treatment: 1 Information Obtained from: Patient Chief Complaint Right lateral ankle ulcer Electronic Signature(s) Signed: 09/29/2021 1:10:41 PM By: Worthy Keeler PA-C Entered By: Worthy Keeler on 09/29/2021 13:10:41 [name] ([id number]) -------------------------------------------------------------------------------- HPI Details Patient Name: [name]. Date of Service: 09/29/2021 12:30 PM Medical Record Number: [id number] Patient Account Number: [id number] Date of Birth/Sex: 04-20-1941 (80 y.o. F) Treating RN: Carlene Coria Primary Care Provider: Frazier Richards Other Clinician: Referring Provider: Frazier Richards Treating Provider/Extender: Skipper Cliche in Treatment: 1 History of Present Illness HPI Description: 09/21/2021 upon evaluation today patient appears to be doing somewhat poorly in regard to her wound on her right lateral ankle. She has been tolerating the dressing changes without complication. Fortunately there does not not appear to be any signs of active infection systemically at this time which is great news. Likely also do not see any major issues which is good news. Nonetheless the biggest problem is that even with attempting to treat this and try to get things under control on her own she is really not been able to get this healed. She does tend to sleep on her right side exclusively due to the fact that she is not able to lay any other way due to pain. She has issues with her right hip. She is a  diabetic her last hemoglobin A1c was 5.6 however this should not be affecting her ability to heal she also had an MRI in July which was negative for osteomyelitis. Overall everything checks out okay but nonetheless she is still experiencing the open wound that she just cannot get closed. She does wear oxygen 24/7. She has a history of congestive heart failure, COPD, and again the diabetes mellitus type 2. 09/29/2021 upon evaluation today patient appears to be doing roughly the same medial movement better as far as some of the necrotic tissue on the surface of the wound is concerned it is kind of gently coming off little by little. With that being said I do not see any signs of active infection systemically at this point which is good news. Electronic Signature(s) Signed: 09/29/2021 2:13:52 PM By: Worthy Keeler PA-C Entered By: Worthy Keeler on 09/29/2021 14:13:52 [name] ([id number]) -------------------------------------------------------------------------------- Physical Exam Details Patient Name: [name]. Date of Service: 09/29/2021 12:30 PM Medical Record Number: [id number] Patient Account Number: [id number] Date of Birth/Sex: 1941/06/28 (80 y.o. F) Treating RN: Carlene Coria Primary Care Provider: Frazier Richards Other Clinician: Referring Provider: Frazier Richards Treating Provider/Extender: Skipper Cliche in Treatment: 1 Constitutional Well-nourished and well-hydrated in no acute distress. Respiratory normal breathing without difficulty. Psychiatric this patient is able to make decisions and demonstrates good insight into disease process. Alert and Oriented x 3. pleasant and cooperative. Notes Upon inspection patient's wound bed actually showed signs of good granulation and some areas although a lot of slough was still noted I think the Annitta Needs is doing a good job here which is great news. Is still a very difficult spot right over this ankle. Nonetheless I  am encouraged at least see some of the granulation growth. Electronic Signature(s) Signed: 09/29/2021 2:14:58 PM By: Worthy Keeler PA-C  Entered By: Worthy Keeler on 09/29/2021 14:14:58 [name] ([id number]) -------------------------------------------------------------------------------- Physician Orders Details Patient Name: [name]. Date of Service: 09/29/2021 12:30 PM Medical Record Number: [id number] Patient Account Number: [id number] Date of Birth/Sex: [birth year] (80 y.o. F) Treating RN: Carlene Coria Primary Care Provider: Frazier Richards Other Clinician: Referring Provider: Frazier Richards Treating Provider/Extender: Skipper Cliche in Treatment: 1 Verbal / Phone Orders: No Diagnosis Coding ICD-10 Coding Code Description E11.622 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 J44.9 Chronic obstructive pulmonary disease, unspecified I50.42 Chronic combined systolic (congestive) and diastolic (congestive) heart failure Z99.81 Dependence on supplemental oxygen Follow-up Appointments o Return Appointment in 1 week. Bathing/ Shower/ Hygiene o May shower; gently cleanse wound with antibacterial soap, rinse and pat dry prior to dressing wounds Wound Treatment Wound #1 - Malleolus Wound Laterality: Right, Lateral Cleanser: Byram Ancillary Kit - 15 Day Supply (Generic) 3 x Per Week/30 Days Discharge Instructions: Use supplies as instructed; Kit contains: (15) Saline Bullets; (15) 3x3 Gauze; 15 pr Gloves Cleanser: Wound Cleanser 3 x Per Week/30 Days Discharge Instructions: Wash your hands with soap and water. Remove old dressing, discard into plastic bag and place into trash. Cleanse the wound with Wound Cleanser prior to applying a clean dressing using gauze sponges, not tissues or cotton balls. Do not scrub or use excessive force. Pat dry using gauze sponges, not tissue or cotton  balls. Primary Dressing: Prisma 4.34 (in) 3 x Per Week/30 Days Discharge Instructions: Moisten w/normal saline or sterile water; Cover wound as directed. Do not remove from wound bed. Secondary Dressing: Zetuvit Plus Silicone Border Dressing 4x4 (in/in) 3 x Per Week/30 Days Secondary Dressing: oil emulsion 3 x Per Week/30 Days Electronic Signature(s) Signed: 09/29/2021 1:32:41 PM By: Carlene Coria RN Signed: 09/29/2021 3:01:26 PM By: Worthy Keeler PA-C Entered By: Carlene Coria on 09/29/2021 13:20:46 [name] ([id number]) -------------------------------------------------------------------------------- Problem List Details Patient Name: [name]. Date of Service: 09/29/2021 12:30 PM Medical Record Number: [id number] Patient Account Number: [id number] Date of Birth/Sex: November 27, 1941 (80 y.o. F) Treating RN: Carlene Coria Primary Care Provider: Frazier Richards Other Clinician: Referring Provider: Frazier Richards Treating Provider/Extender: Skipper Cliche in Treatment: 1 Active Problems ICD-10 Encounter Code Description Active Date MDM Diagnosis E11.622 Type 2 diabetes mellitus with other skin ulcer 09/21/2021 No Yes L97.312 Non-pressure chronic ulcer of right ankle with fat layer exposed 09/21/2021 No Yes J44.9 Chronic obstructive pulmonary disease, unspecified 09/21/2021 No Yes I50.42 Chronic combined systolic (congestive) and diastolic (congestive) heart 09/21/2021 No Yes failure Z99.81 Dependence on supplemental oxygen 09/21/2021 No Yes Inactive Problems Resolved Problems Electronic Signature(s) Signed: 09/29/2021 1:10:36 PM By: Worthy Keeler PA-C Entered By: Worthy Keeler on 09/29/2021 13:10:36 [name] ([id number]) -------------------------------------------------------------------------------- Progress Note Details Patient Name: [name]. Date of Service: 09/29/2021 12:30 PM Medical Record Number: [id number] Patient Account Number:  [id number] Date of Birth/Sex: 1941/11/15 (80 y.o. F) Treating RN: Carlene Coria Primary Care Provider: Frazier Richards Other Clinician: Referring Provider: Frazier Richards Treating Provider/Extender: Skipper Cliche in Treatment: 1 Subjective Chief Complaint Information obtained from Patient Right lateral ankle ulcer History of Present Illness (HPI) 09/21/2021 upon evaluation today patient appears to be doing somewhat poorly in regard to her wound on her right lateral ankle. She has been tolerating the dressing changes without complication. Fortunately there does not not appear to be any signs of active infection systemically at this time which is great news.  Likely also do not see any major issues which is good news. Nonetheless the biggest problem is that even with attempting to treat this and try to get things under control on her own she is really not been able to get this healed. She does tend to sleep on her right side exclusively due to the fact that she is not able to lay any other way due to pain. She has issues with her right hip. She is a diabetic her last hemoglobin A1c was 5.6 however this should not be affecting her ability to heal she also had an MRI in July which was negative for osteomyelitis. Overall everything checks out okay but nonetheless she is still experiencing the open wound that she just cannot get closed. She does wear oxygen 24/7. She has a history of congestive heart failure, COPD, and again the diabetes mellitus type 2. 09/29/2021 upon evaluation today patient appears to be doing roughly the same medial movement better as far as some of the necrotic tissue on the surface of the wound is concerned it is kind of gently coming off little by little. With that being said I do not see any signs of active infection systemically at this point which is good news. Objective Constitutional Well-nourished and well-hydrated in no acute distress. Vitals Time Taken: 12:55  PM, Height: 65 in, Weight: 98 lbs, BMI: 16.3, Temperature: 98.1 F, Pulse: 79 bpm, Respiratory Rate: 18 breaths/min, Blood Pressure: 121/68 mmHg. Respiratory normal breathing without difficulty. Psychiatric this patient is able to make decisions and demonstrates good insight into disease process. Alert and Oriented x 3. pleasant and cooperative. General Notes: Upon inspection patient's wound bed actually showed signs of good granulation and some areas although a lot of slough was still noted I think the Annitta Needs is doing a good job here which is great news. Is still a very difficult spot right over this ankle. Nonetheless I am encouraged at least see some of the granulation growth. Integumentary (Hair, Skin) Wound #1 status is Open. Original cause of wound was Gradually Appeared. The date acquired was: 05/21/2021. The wound has been in treatment 1 weeks. The wound is located on the Right,Lateral Malleolus. The wound measures 0.5cm length x 0.3cm width x 0.2cm depth; 0.118cm^2 area and 0.024cm^3 volume. There is Fat Layer (Subcutaneous Tissue) exposed. There is no tunneling or undermining noted. There is a medium amount of serosanguineous drainage noted. There is large (67-100%) red granulation within the wound bed. There is no necrotic tissue within the wound bed. Assessment Active Problems ICD-10 Type 2 diabetes mellitus with other skin ulcer [name], [name]. 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 Bathing/ Shower/ Hygiene: May shower; gently cleanse wound with antibacterial soap, rinse and pat dry prior to dressing wounds WOUND #1: - Malleolus Wound Laterality: Right, Lateral Cleanser: Byram Ancillary Kit - 15 Day Supply  (Generic) 3 x Per Week/30 Days Discharge Instructions: Use supplies as instructed; Kit contains: (15) Saline Bullets; (15) 3x3 Gauze; 15 pr Gloves Cleanser: Wound Cleanser 3 x Per Week/30 Days Discharge Instructions: Wash your hands with soap and water. Remove old dressing, discard into plastic bag and place into trash. Cleanse the wound with Wound Cleanser prior to applying a clean dressing using gauze sponges, not tissues or cotton balls. Do not scrub or use excessive force. Pat dry  using gauze sponges, not tissue or cotton balls. Primary Dressing: Prisma 4.34 (in) 3 x Per Week/30 Days Discharge Instructions: Moisten w/normal saline or sterile water; Cover wound as directed. Do not remove from wound bed. Secondary Dressing: Zetuvit Plus Silicone Border Dressing 4x4 (in/in) 3 x Per Week/30 Days Secondary Dressing: oil emulsion 3 x Per Week/30 Days 1. Would recommend currently that we going continue with the wound care measures as before using silver collagen I think that still the best way to go. 2. Muscle can recommend that we continue to cover this with a Zetuvit border foam dressing. They are using all emulsion underneath. 3. Muscle can recommend patient continue to prevent any pressure get into the ankle region I think that still of utmost importance. We will see patient back for reevaluation in 1 week here in the clinic. If anything worsens or changes patient will contact our office for additional recommendations. Electronic Signature(s) Signed: 09/29/2021 2:15:45 PM By: Worthy Keeler PA-C Entered By: Worthy Keeler on 09/29/2021 14:15:44 [name] ([id number]) -------------------------------------------------------------------------------- SuperBill Details Patient Name: [name]. Date of Service: 09/29/2021 Medical Record Number: [id number] Patient Account Number: [id number] Date of Birth/Sex: [birth year] (80 y.o. F) Treating RN: Carlene Coria Primary Care Provider:  Frazier Richards Other Clinician: Referring Provider: Frazier Richards Treating Provider/Extender: Skipper Cliche in Treatment: 1 Diagnosis Coding ICD-10 Codes Code Description E11.622 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 J44.9 Chronic obstructive pulmonary disease, unspecified I50.42 Chronic combined systolic (congestive) and diastolic (congestive) heart failure Z99.81 Dependence on supplemental oxygen Facility Procedures CPT4 Code: [id number] Description: 99213 - WOUND CARE VISIT-LEV 3 EST PT Modifier: Quantity: 1 Physician Procedures CPT4 Code: [id number] Description: 99214 - WC PHYS LEVEL 4 - EST PT Modifier: Quantity: 1 CPT4 Code: Description: ICD-10 Diagnosis Description E11.622 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 J44.9 Chronic obstructive pulmonary disease, unspecified I50.42 Chronic combined  systolic (congestive) and diastolic (congestive) hear Modifier: t failure Quantity: Electronic Signature(s) Signed: 09/29/2021 2:16:03 PM By: Worthy Keeler PA-C Previous Signature: 09/29/2021 1:32:41 PM Version By: Carlene Coria RN Entered By: Worthy Keeler on 09/29/2021 14:16:02

## 2021-09-29 NOTE — Progress Notes (Signed)
DASHANIQUE, BROWNSTEIN ([id number]) Visit Report for 09/29/2021 Arrival Information Details Patient Name: [name], [name]. Date of Service: 09/29/2021 12:30 PM Medical Record Number: [id number] Patient Account Number: [id number] Date of Birth/Sex: [birth year] (80 y.o. F) Treating RN: Carlene Coria Primary Care Aseret Hoffman: Frazier Richards Other Clinician: Referring Patton Rabinovich: Frazier Richards Treating Karysa Heft/Extender: Skipper Cliche in Treatment: 1 Visit Information History Since Last Visit All ordered tests and consults were completed: No Patient Arrived: Wheel Chair Added or deleted any medications: No Arrival Time: 12:53 Any new allergies or adverse reactions: No Accompanied By: caregiver Had a fall or experienced change in No Transfer Assistance: None activities of daily living that may affect Patient Identification Verified: Yes risk of falls: Secondary Verification Process Completed: Yes Signs or symptoms of abuse/neglect since last visito No Patient Requires Transmission-Based Precautions: No Hospitalized since last visit: No Patient Has Alerts: No Implantable device outside of the clinic excluding No cellular tissue based products placed in the center since last visit: Has Dressing in Place as Prescribed: Yes Pain Present Now: No Electronic Signature(s) Signed: 09/29/2021 1:32:41 PM By: Carlene Coria RN Entered By: Carlene Coria on 09/29/2021 12:55:11 [name] ([id number]) -------------------------------------------------------------------------------- Clinic Level of Care Assessment Details Patient Name: [name]. Date of Service: 09/29/2021 12:30 PM Medical Record Number: [id number] Patient Account Number: [id number] Date of Birth/Sex: 10/27/1941 (80 y.o. F) Treating RN: Carlene Coria Primary Care Jasmina Gendron: Frazier Richards Other Clinician: Referring Londynn Sonoda: Frazier Richards Treating Ruble Buttler/Extender: Skipper Cliche in Treatment: 1 Clinic  Level of Care Assessment Items TOOL 4 Quantity Score X - Use when only an EandM is performed on FOLLOW-UP visit 1 0 ASSESSMENTS - Nursing Assessment / Reassessment X - Reassessment of Co-morbidities (includes updates in patient status) 1 10 X- 1 5 Reassessment of Adherence to Treatment Plan ASSESSMENTS - Wound and Skin Assessment / Reassessment X - Simple Wound Assessment / Reassessment - one wound 1 5 []  - 0 Complex Wound Assessment / Reassessment - multiple wounds []  - 0 Dermatologic / Skin Assessment (not related to wound area) ASSESSMENTS - Focused Assessment []  - Circumferential Edema Measurements - multi extremities 0 []  - 0 Nutritional Assessment / Counseling / Intervention []  - 0 Lower Extremity Assessment (monofilament, tuning fork, pulses) []  - 0 Peripheral Arterial Disease Assessment (using hand held doppler) ASSESSMENTS - Ostomy and/or Continence Assessment and Care []  - Incontinence Assessment and Management 0 []  - 0 Ostomy Care Assessment and Management (repouching, etc.) PROCESS - Coordination of Care X - Simple Patient / Family Education for ongoing care 1 15 []  - 0 Complex (extensive) Patient / Family Education for ongoing care X- 1 10 Staff obtains Programmer, systems, Records, Test Results / Process Orders []  - 0 Staff telephones HHA, Nursing Homes / Clarify orders / etc []  - 0 Routine Transfer to another Facility (non-emergent condition) []  - 0 Routine Hospital Admission (non-emergent condition) []  - 0 New Admissions / Biomedical engineer / Ordering NPWT, Apligraf, etc. []  - 0 Emergency Hospital Admission (emergent condition) X- 1 10 Simple Discharge Coordination []  - 0 Complex (extensive) Discharge Coordination PROCESS - Special Needs []  - Pediatric / Minor Patient Management 0 []  - 0 Isolation Patient Management []  - 0 Hearing / Language / Visual special needs []  - 0 Assessment of Community assistance (transportation, D/C planning, etc.) []  -  0 Additional assistance / Altered mentation []  - 0 Support Surface(s) Assessment (bed, cushion, seat, etc.) INTERVENTIONS - Wound Cleansing / Measurement ADALEAH, FORGET. ([id number]) X- 1 5 Simple Wound Cleansing -  one wound []  - 0 Complex Wound Cleansing - multiple wounds X- 1 5 Wound Imaging (photographs - any number of wounds) []  - 0 Wound Tracing (instead of photographs) X- 1 5 Simple Wound Measurement - one wound []  - 0 Complex Wound Measurement - multiple wounds INTERVENTIONS - Wound Dressings X - Small Wound Dressing one or multiple wounds 1 10 []  - 0 Medium Wound Dressing one or multiple wounds []  - 0 Large Wound Dressing one or multiple wounds X- 1 5 Application of Medications - topical []  - 0 Application of Medications - injection INTERVENTIONS - Miscellaneous []  - External ear exam 0 []  - 0 Specimen Collection (cultures, biopsies, blood, body fluids, etc.) []  - 0 Specimen(s) / Culture(s) sent or taken to Lab for analysis []  - 0 Patient Transfer (multiple staff / Civil Service fast streamer / Similar devices) []  - 0 Simple Staple / Suture removal (25 or less) []  - 0 Complex Staple / Suture removal (26 or more) []  - 0 Hypo / Hyperglycemic Management (close monitor of Blood Glucose) []  - 0 Ankle / Brachial Index (ABI) - do not check if billed separately X- 1 5 Vital Signs Has the patient been seen at the hospital within the last three years: Yes Total Score: 90 Level Of Care: New/Established - Level 3 Electronic Signature(s) Signed: 09/29/2021 1:32:41 PM By: Carlene Coria RN Entered By: Carlene Coria on 09/29/2021 13:15:21 [name] ([id number]) -------------------------------------------------------------------------------- Encounter Discharge Information Details Patient Name: [name]. Date of Service: 09/29/2021 12:30 PM Medical Record Number: [id number] Patient Account Number: [id number] Date of Birth/Sex: 11-17-1941 (80 y.o. F) Treating RN: Carlene Coria Primary Care Silvio Sausedo: Frazier Richards Other Clinician: Referring Madai Nuccio: Frazier Richards Treating Alpa Salvo/Extender: Skipper Cliche in Treatment: 1 Encounter Discharge Information Items Discharge Condition: Stable Ambulatory Status: Wheelchair Discharge Destination: Home Transportation: Private Auto Accompanied By: self Schedule Follow-up Appointment: Yes Clinical Summary of Care: Patient Declined Electronic Signature(s) Signed: 09/29/2021 1:32:41 PM By: Carlene Coria RN Entered By: Carlene Coria on 09/29/2021 13:16:21 [name] ([id number]) -------------------------------------------------------------------------------- Lower Extremity Assessment Details Patient Name: [name]. Date of Service: 09/29/2021 12:30 PM Medical Record Number: [id number] Patient Account Number: [id number] Date of Birth/Sex: Dec 26, 1941 (80 y.o. F) Treating RN: Carlene Coria Primary Care Omunique Pederson: Frazier Richards Other Clinician: Referring Lilliona Blakeney: Frazier Richards Treating Shanaya Schneck/Extender: Jeri Cos Weeks in Treatment: 1 Edema Assessment Assessed: [Left: No] [Right: No] Edema: [Left: Ye] [Right: s] Vascular Assessment Pulses: Dorsalis Pedis Palpable: [Right:Yes] Electronic Signature(s) Signed: 09/29/2021 1:32:41 PM By: Carlene Coria RN Entered By: Carlene Coria on 09/29/2021 13:07:29 [name] ([id number]) -------------------------------------------------------------------------------- Multi Wound Chart Details Patient Name: [name]. Date of Service: 09/29/2021 12:30 PM Medical Record Number: [id number] Patient Account Number: [id number] Date of Birth/Sex: 01/27/1941 (80 y.o. F) Treating RN: Carlene Coria Primary Care Kaliana Albino: Frazier Richards Other Clinician: Referring Renatha Rosen: Frazier Richards Treating [place] Chelf/Extender: Skipper Cliche in Treatment: 1 Vital Signs Height(in): 65 Pulse(bpm): 64 Weight(lbs): 49 Blood  Pressure(mmHg): 121/68 Body Mass Index(BMI): 16 Temperature(F): 98.1 Respiratory Rate(breaths/min): 18 Photos: [N/A:N/A] Wound Location: Right, Lateral Malleolus N/A N/A Wounding Event: Gradually Appeared N/A N/A Primary Etiology: Diabetic Wound/Ulcer of the Lower N/A N/A Extremity Comorbid History: Chronic Obstructive Pulmonary N/A N/A Disease (COPD), Congestive Heart Failure, Type II Diabetes Date Acquired: 05/21/2021 N/A N/A Weeks of Treatment: 1 N/A N/A Wound Status: Open N/A N/A Measurements L x W x D (cm) 0.5x0.3x0.2 N/A N/A Area (cm) : 0.118 N/A N/A Volume (cm) : 0.024 N/A N/A % Reduction in Area:  24.80% N/A N/A % Reduction in Volume: -50.00% N/A N/A Classification: Grade 1 N/A N/A Exudate Amount: Medium N/A N/A Exudate Type: Serosanguineous N/A N/A Exudate Color: red, brown N/A N/A Granulation Amount: Large (67-100%) N/A N/A Granulation Quality: Red N/A N/A Necrotic Amount: None Present (0%) N/A N/A Exposed Structures: Fat Layer (Subcutaneous Tissue): N/A N/A Yes Fascia: No Tendon: No Muscle: No Joint: No Bone: No Epithelialization: None N/A N/A Treatment Notes Electronic Signature(s) Signed: 09/29/2021 1:32:41 PM By: Carlene Coria RN Entered By: Carlene Coria on 09/29/2021 13:13:18 [name] ([id number]) -------------------------------------------------------------------------------- [place] Details Patient Name: [name]. Date of Service: 09/29/2021 12:30 PM Medical Record Number: [id number] Patient Account Number: [id number] Date of Birth/Sex: 1941/04/29 (80 y.o. F) Treating RN: Carlene Coria Primary Care Audel Coakley: Frazier Richards Other Clinician: Referring Humaira Sculley: Frazier Richards Treating Princetta Uplinger/Extender: Skipper Cliche in Treatment: 1 Active Inactive Necrotic Tissue Nursing Diagnoses: Impaired tissue integrity related to necrotic/devitalized tissue Goals: Necrotic/devitalized tissue will be minimized  in the wound bed Date Initiated: 09/21/2021 Target Resolution Date: 09/21/2021 Goal Status: Active Patient/caregiver will verbalize understanding of reason and process for debridement of necrotic tissue Date Initiated: 09/21/2021 Target Resolution Date: 09/21/2021 Goal Status: Active Interventions: Assess patient pain level pre-, during and post procedure and prior to discharge Provide education on necrotic tissue and debridement process Treatment Activities: Apply topical anesthetic as ordered : 09/21/2021 Biologic debridement : 09/21/2021 Enzymatic debridement : 09/21/2021 Excisional debridement : 09/21/2021 Notes: Orientation to the Wound Care Program Nursing Diagnoses: Knowledge deficit related to the wound healing center program Goals: Patient/caregiver will verbalize understanding of the [place] Date Initiated: 09/21/2021 Target Resolution Date: 09/21/2021 Goal Status: Active Interventions: Provide education on orientation to the wound center Notes: Wound/Skin Impairment Nursing Diagnoses: Impaired tissue integrity Goals: Patient/caregiver will verbalize understanding of skin care regimen Date Initiated: 09/21/2021 Target Resolution Date: 09/21/2021 Goal Status: Active Ulcer/skin breakdown will have a volume reduction of 30% by week 4 Date Initiated: 09/21/2021 Target Resolution Date: 10/21/2021 Goal Status: Active Ulcer/skin breakdown will have a volume reduction of 50% by week 8 Date Initiated: 09/21/2021 Target Resolution Date: 11/21/2021 [name], [name] ([id number]) Goal Status: Active Ulcer/skin breakdown will have a volume reduction of 80% by week 12 Date Initiated: 09/21/2021 Target Resolution Date: 12/21/2021 Goal Status: Active Ulcer/skin breakdown will heal within 14 weeks Date Initiated: 09/21/2021 Target Resolution Date: 01/21/2022 Goal Status: Active Interventions: Assess patient/caregiver ability to obtain necessary supplies Assess  patient/caregiver ability to perform ulcer/skin care regimen upon admission and as needed Assess ulceration(s) every visit Provide education on ulcer and skin care Treatment Activities: Referred to DME Zarra Geffert for dressing supplies : 09/21/2021 Skin care regimen initiated : 09/21/2021 Notes: Electronic Signature(s) Signed: 09/29/2021 1:32:41 PM By: Carlene Coria RN Entered By: Carlene Coria on 09/29/2021 13:13:01 [name] ([id number]) -------------------------------------------------------------------------------- Pain Assessment Details Patient Name: [name]. Date of Service: 09/29/2021 12:30 PM Medical Record Number: [id number] Patient Account Number: [id number] Date of Birth/Sex: [birth year] (80 y.o. F) Treating RN: Carlene Coria Primary Care Pantera Winterrowd: Frazier Richards Other Clinician: Referring Raeleigh Guinn: Frazier Richards Treating Joanna Hall/Extender: Skipper Cliche in Treatment: 1 Active Problems Location of Pain Severity and Description of Pain Patient Has Paino No Site Locations Pain Management and Medication Current Pain Management: Electronic Signature(s) Signed: 09/29/2021 1:32:41 PM By: Carlene Coria RN Entered By: Carlene Coria on 09/29/2021 12:59:20 [name] ([id number]) -------------------------------------------------------------------------------- Patient/Caregiver Education Details Patient Name: [name]. Date of Service: 09/29/2021 12:30 PM Medical Record Number: [id number] Patient  Account Number: [id number] Date of Birth/Gender: Jan 26, 1941 (80 y.o. F) Treating RN: Carlene Coria Primary Care Physician: Frazier Richards Other Clinician: Referring Physician: Frazier Richards Treating Physician/Extender: Skipper Cliche in Treatment: 1 Education Assessment Education Provided To: Patient Education Topics Provided Wound Debridement: Methods: Explain/Verbal Responses: State content correctly Electronic Signature(s) Signed:  09/29/2021 1:32:41 PM By: Carlene Coria RN Entered By: Carlene Coria on 09/29/2021 13:15:48 [name] ([id number]) -------------------------------------------------------------------------------- Wound Assessment Details Patient Name: [name]. Date of Service: 09/29/2021 12:30 PM Medical Record Number: [id number] Patient Account Number: [id number] Date of Birth/Sex: 11-May-1941 (80 y.o. F) Treating RN: Carlene Coria Primary Care Cresencia Asmus: Frazier Richards Other Clinician: Referring Shyloh Derosa: Frazier Richards Treating Shahzain Kiester/Extender: Skipper Cliche in Treatment: 1 Wound Status Wound Number: 1 Primary Diabetic Wound/Ulcer of the Lower Extremity Etiology: Wound Location: Right, Lateral Malleolus Wound Open Wounding Event: Gradually Appeared Status: Date Acquired: 05/21/2021 Comorbid Chronic Obstructive Pulmonary Disease (COPD), Weeks Of Treatment: 1 History: Congestive Heart Failure, Type II Diabetes Clustered Wound: No Photos Wound Measurements Length: (cm) 0.5 Width: (cm) 0.3 Depth: (cm) 0.2 Area: (cm) 0.118 Volume: (cm) 0.024 % Reduction in Area: 24.8% % Reduction in Volume: -50% Epithelialization: None Tunneling: No Undermining: No Wound Description Classification: Grade 1 Exudate Amount: Medium Exudate Type: Serosanguineous Exudate Color: red, brown Foul Odor After Cleansing: No Slough/Fibrino No Wound Bed Granulation Amount: Large (67-100%) Exposed Structure Granulation Quality: Red Fascia Exposed: No Necrotic Amount: None Present (0%) Fat Layer (Subcutaneous Tissue) Exposed: Yes Tendon Exposed: No Muscle Exposed: No Joint Exposed: No Bone Exposed: No Treatment Notes Wound #1 (Malleolus) Wound Laterality: Right, Lateral Cleanser [place] Ancillary Kit - 15 Day Supply Discharge Instruction: Use supplies as instructed; Kit contains: (15) Saline Bullets; (15) 3x3 Gauze; 15 pr Gloves Wound Cleanser Discharge Instruction: Wash your hands  with soap and water. Remove old dressing, discard into plastic bag and place into trash. Cleanse the wound with Wound Cleanser prior to applying a clean dressing using gauze sponges, not tissues or cotton balls. Do not [name], [name]. ([id number]) scrub or use excessive force. Pat dry using gauze sponges, not tissue or cotton balls. Peri-Wound Care Topical Primary Dressing Prisma 4.34 (in) Discharge Instruction: Moisten w/normal saline or sterile water; Cover wound as directed. Do not remove from wound bed. Zetuvit Plus Silicone Border Dressing 4x4 (in/in) Discharge Instruction: Secure dressing Secondary Dressing Secured With Compression Wrap Compression Stockings Add-Ons Electronic Signature(s) Signed: 09/29/2021 1:32:41 PM By: Carlene Coria RN Entered By: Carlene Coria on 09/29/2021 13:05:56 [name] ([id number]) -------------------------------------------------------------------------------- [place] Details Patient Name: [name]. Date of Service: 09/29/2021 12:30 PM Medical Record Number: [id number] Patient Account Number: [id number] Date of Birth/Sex: 05-23-1941 (80 y.o. F) Treating RN: Carlene Coria Primary Care Damari Hiltz: Frazier Richards Other Clinician: Referring Tyniesha Howald: Frazier Richards Treating Aveyah Greenwood/Extender: Skipper Cliche in Treatment: 1 Vital Signs Time Taken: 12:55 Temperature (F): 98.1 Height (in): 65 Pulse (bpm): 79 Weight (lbs): 98 Respiratory Rate (breaths/min): 18 Body Mass Index (BMI): 16.3 Blood Pressure (mmHg): 121/68 Reference Range: 80 - 120 mg / dl Electronic Signature(s) Signed: 09/29/2021 1:32:41 PM By: Carlene Coria RN Entered By: Carlene Coria on 09/29/2021 12:59:14

## 2021-10-06 ENCOUNTER — Other Ambulatory Visit: Payer: Self-pay

## 2021-10-06 ENCOUNTER — Encounter: Payer: PPO | Attending: Physician Assistant | Admitting: Physician Assistant

## 2021-10-06 DIAGNOSIS — S61412A Laceration without foreign body of left hand, initial encounter: Secondary | ICD-10-CM | POA: Diagnosis not present

## 2021-10-06 DIAGNOSIS — Z9981 Dependence on supplemental oxygen: Secondary | ICD-10-CM | POA: Diagnosis not present

## 2021-10-06 DIAGNOSIS — J449 Chronic obstructive pulmonary disease, unspecified: Secondary | ICD-10-CM | POA: Diagnosis not present

## 2021-10-06 DIAGNOSIS — L97312 Non-pressure chronic ulcer of right ankle with fat layer exposed: Secondary | ICD-10-CM | POA: Insufficient documentation

## 2021-10-06 DIAGNOSIS — E1151 Type 2 diabetes mellitus with diabetic peripheral angiopathy without gangrene: Secondary | ICD-10-CM | POA: Diagnosis not present

## 2021-10-06 DIAGNOSIS — I5042 Chronic combined systolic (congestive) and diastolic (congestive) heart failure: Secondary | ICD-10-CM | POA: Diagnosis not present

## 2021-10-06 DIAGNOSIS — E11622 Type 2 diabetes mellitus with other skin ulcer: Secondary | ICD-10-CM | POA: Diagnosis not present

## 2021-10-06 DIAGNOSIS — L97319 Non-pressure chronic ulcer of right ankle with unspecified severity: Secondary | ICD-10-CM | POA: Diagnosis present

## 2021-10-06 DIAGNOSIS — X58XXXA Exposure to other specified factors, initial encounter: Secondary | ICD-10-CM | POA: Insufficient documentation

## 2021-10-06 NOTE — Progress Notes (Addendum)
ADALYN, PENNOCK ([id number]) Visit Report for 10/06/2021 Chief Complaint Document Details Patient Name: [name], [name]. Date of Service: 10/06/2021 1:15 PM Medical Record Number: [id number] Patient Account Number: [id number] Date of Birth/Sex: March 16, 1941 (80 y.o. F) Treating RN: Carlene Coria Primary Care Provider: Frazier Richards Other Clinician: Referring Provider: Frazier Richards Treating Provider/Extender: Skipper Cliche in Treatment: 2 Information Obtained from: Patient Chief Complaint Right lateral ankle ulcer Electronic Signature(s) Signed: 10/06/2021 1:38:23 PM By: Worthy Keeler PA-C Entered By: Worthy Keeler on 10/06/2021 13:38:23 [name] ([id number]) -------------------------------------------------------------------------------- HPI Details Patient Name: [name]. Date of Service: 10/06/2021 1:15 PM Medical Record Number: [id number] Patient Account Number: [id number] Date of Birth/Sex: 1941-07-13 (80 y.o. F) Treating RN: Carlene Coria Primary Care Provider: Frazier Richards Other Clinician: Referring Provider: Frazier Richards Treating Provider/Extender: Skipper Cliche in Treatment: 2 History of Present Illness HPI Description: 09/21/2021 upon evaluation today patient appears to be doing somewhat poorly in regard to her wound on her right lateral ankle. She has been tolerating the dressing changes without complication. Fortunately there does not not appear to be any signs of active infection systemically at this time which is great news. Likely also do not see any major issues which is good news. Nonetheless the biggest problem is that even with attempting to treat this and try to get things under control on her own she is really not been able to get this healed. She does tend to sleep on her right side exclusively due to the fact that she is not able to lay any other way due to pain. She has issues with her right hip. She is a diabetic  her last hemoglobin A1c was 5.6 however this should not be affecting her ability to heal she also had an MRI in July which was negative for osteomyelitis. Overall everything checks out okay but nonetheless she is still experiencing the open wound that she just cannot get closed. She does wear oxygen 24/7. She has a history of congestive heart failure, COPD, and again the diabetes mellitus type 2. 09/29/2021 upon evaluation today patient appears to be doing roughly the same medial movement better as far as some of the necrotic tissue on the surface of the wound is concerned it is kind of gently coming off little by little. With that being said I do not see any signs of active infection systemically at this point which is good news. 10/06/2021 upon evaluation today patient appears to be doing excellent in regard to her wound from an infection standpoint I do not see any issues here currently. Unfortunately she continues develop slough over the surface of the wound where using collagen I am not certain that skin to be the best way to go going forward. I really think she may do well with Iodoflex. Electronic Signature(s) Signed: 10/06/2021 2:04:17 PM By: Worthy Keeler PA-C Entered By: Worthy Keeler on 10/06/2021 14:04:17 [name] ([id number]) -------------------------------------------------------------------------------- Physical Exam Details Patient Name: [name]. Date of Service: 10/06/2021 1:15 PM Medical Record Number: [id number] Patient Account Number: [id number] Date of Birth/Sex: [birth year] (80 y.o. F) Treating RN: Carlene Coria Primary Care Provider: Frazier Richards Other Clinician: Referring Provider: Frazier Richards Treating Provider/Extender: Skipper Cliche in Treatment: 2 Constitutional Well-nourished and well-hydrated in no acute distress. Respiratory normal breathing without difficulty. Psychiatric this patient is able to make decisions and  demonstrates good insight into disease process. Alert and Oriented x 3. pleasant and cooperative. Notes Upon inspection patient's wound  bed actually showed some signs of necrotic tissue over the surface of the wound. I am going go ahead and switch over to Iodoflex currently I consider debridement but she has a lot of discomfort with this and with it being so tightly adhered I felt like a weeks worth of the medication may be better than jumping straight into the debridement at this point. She agreed with the plan. We will see how things do. Electronic Signature(s) Signed: 10/06/2021 2:04:45 PM By: Worthy Keeler PA-C Entered By: Worthy Keeler on 10/06/2021 14:04:45 [name] ([id number]) -------------------------------------------------------------------------------- Physician Orders Details Patient Name: [name]. Date of Service: 10/06/2021 1:15 PM Medical Record Number: [id number] Patient Account Number: [id number] Date of Birth/Sex: 09/02/1941 (80 y.o. F) Treating RN: Carlene Coria Primary Care Provider: Frazier Richards Other Clinician: Referring Provider: Frazier Richards Treating Provider/Extender: Skipper Cliche in Treatment: 2 Verbal / Phone Orders: No Diagnosis Coding ICD-10 Coding Code Description E11.622 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 J44.9 Chronic obstructive pulmonary disease, unspecified I50.42 Chronic combined systolic (congestive) and diastolic (congestive) heart failure Z99.81 Dependence on supplemental oxygen Follow-up Appointments o Return Appointment in 1 week. Bathing/ Shower/ Hygiene o May shower; gently cleanse wound with antibacterial soap, rinse and pat dry prior to dressing wounds Wound Treatment Wound #1 - Malleolus Wound Laterality: Right, Lateral Cleanser: Byram Ancillary Kit - 15 Day Supply (Generic) 3 x Per Week/30 Days Discharge  Instructions: Use supplies as instructed; Kit contains: (15) Saline Bullets; (15) 3x3 Gauze; 15 pr Gloves Cleanser: Wound Cleanser 3 x Per Week/30 Days Discharge Instructions: Wash your hands with soap and water. Remove old dressing, discard into plastic bag and place into trash. Cleanse the wound with Wound Cleanser prior to applying a clean dressing using gauze sponges, not tissues or cotton balls. Do not scrub or use excessive force. Pat dry using gauze sponges, not tissue or cotton balls. Primary Dressing: IODOFLEX 0.9% Cadexomer Iodine Pad 3 x Per Week/30 Days Discharge Instructions: Apply Iodoflex to wound bed only as directed. Secondary Dressing: Zetuvit Plus Silicone Border Dressing 4x4 (in/in) 3 x Per Week/30 Days Electronic Signature(s) Signed: 10/06/2021 6:01:56 PM By: Worthy Keeler PA-C Signed: 10/12/2021 1:01:44 PM By: Carlene Coria RN Entered By: Carlene Coria on 10/06/2021 13:45:50 [name] ([id number]) -------------------------------------------------------------------------------- Problem List Details Patient Name: [name]. Date of Service: 10/06/2021 1:15 PM Medical Record Number: [id number] Patient Account Number: [id number] Date of Birth/Sex: 08-24-1941 (80 y.o. F) Treating RN: Carlene Coria Primary Care Provider: Frazier Richards Other Clinician: Referring Provider: Frazier Richards Treating Provider/Extender: Skipper Cliche in Treatment: 2 Active Problems ICD-10 Encounter Code Description Active Date MDM Diagnosis E11.622 Type 2 diabetes mellitus with other skin ulcer 09/21/2021 No Yes L97.312 Non-pressure chronic ulcer of right ankle with fat layer exposed 09/21/2021 No Yes J44.9 Chronic obstructive pulmonary disease, unspecified 09/21/2021 No Yes I50.42 Chronic combined systolic (congestive) and diastolic (congestive) heart 09/21/2021 No Yes failure Z99.81 Dependence on supplemental oxygen 09/21/2021 No Yes Inactive Problems Resolved  Problems Electronic Signature(s) Signed: 10/06/2021 1:38:10 PM By: Worthy Keeler PA-C Entered By: Worthy Keeler on 10/06/2021 13:38:10 [name] ([id number]) -------------------------------------------------------------------------------- Progress Note Details Patient Name: [name]. Date of Service: 10/06/2021 1:15 PM Medical Record Number: [id number] Patient Account Number: [id number] Date of Birth/Sex: 07-04-1941 (80 y.o. F) Treating RN: Carlene Coria Primary Care Provider: Frazier Richards Other Clinician: Referring Provider: Frazier Richards Treating Provider/Extender: Joaquim Lai,  Vance Gather in Treatment: 2 Subjective Chief Complaint Information obtained from Patient Right lateral ankle ulcer History of Present Illness (HPI) 09/21/2021 upon evaluation today patient appears to be doing somewhat poorly in regard to her wound on her right lateral ankle. She has been tolerating the dressing changes without complication. Fortunately there does not not appear to be any signs of active infection systemically at this time which is great news. Likely also do not see any major issues which is good news. Nonetheless the biggest problem is that even with attempting to treat this and try to get things under control on her own she is really not been able to get this healed. She does tend to sleep on her right side exclusively due to the fact that she is not able to lay any other way due to pain. She has issues with her right hip. She is a diabetic her last hemoglobin A1c was 5.6 however this should not be affecting her ability to heal she also had an MRI in July which was negative for osteomyelitis. Overall everything checks out okay but nonetheless she is still experiencing the open wound that she just cannot get closed. She does wear oxygen 24/7. She has a history of congestive heart failure, COPD, and again the diabetes mellitus type 2. 09/29/2021 upon evaluation today patient  appears to be doing roughly the same medial movement better as far as some of the necrotic tissue on the surface of the wound is concerned it is kind of gently coming off little by little. With that being said I do not see any signs of active infection systemically at this point which is good news. 10/06/2021 upon evaluation today patient appears to be doing excellent in regard to her wound from an infection standpoint I do not see any issues here currently. Unfortunately she continues develop slough over the surface of the wound where using collagen I am not certain that skin to be the best way to go going forward. I really think she may do well with Iodoflex. Objective Constitutional Well-nourished and well-hydrated in no acute distress. Vitals Time Taken: 1:30 PM, Height: 65 in, Weight: 98 lbs, BMI: 16.3, Temperature: 98.2 F, Pulse: 89 bpm, Respiratory Rate: 18 breaths/min, Blood Pressure: 103/60 mmHg. Respiratory normal breathing without difficulty. Psychiatric this patient is able to make decisions and demonstrates good insight into disease process. Alert and Oriented x 3. pleasant and cooperative. General Notes: Upon inspection patient's wound bed actually showed some signs of necrotic tissue over the surface of the wound. I am going go ahead and switch over to Iodoflex currently I consider debridement but she has a lot of discomfort with this and with it being so tightly adhered I felt like a weeks worth of the medication may be better than jumping straight into the debridement at this point. She agreed with the plan. We will see how things do. Integumentary (Hair, Skin) Wound #1 status is Open. Original cause of wound was Gradually Appeared. The date acquired was: 05/21/2021. The wound has been in treatment 2 weeks. The wound is located on the Right,Lateral Malleolus. The wound measures 0.5cm length x 0.5cm width x 0.2cm depth; 0.196cm^2 area and 0.039cm^3 volume. There is Fat Layer  (Subcutaneous Tissue) exposed. There is no tunneling or undermining noted. There is a medium amount of serosanguineous drainage noted. There is large (67-100%) red granulation within the wound bed. There is no necrotic tissue within the wound bed. [name], [name] ([id number]) Assessment Active Problems ICD-10  Type 2 diabetes mellitus with other skin ulcer Non-pressure chronic ulcer of right ankle with fat layer exposed Chronic obstructive pulmonary disease, unspecified Chronic combined systolic (congestive) and diastolic (congestive) heart failure Dependence on supplemental oxygen Plan Follow-up Appointments: Return Appointment in 1 week. Bathing/ Shower/ Hygiene: May shower; gently cleanse wound with antibacterial soap, rinse and pat dry prior to dressing wounds WOUND #1: - Malleolus Wound Laterality: Right, Lateral Cleanser: Byram Ancillary Kit - 15 Day Supply (Generic) 3 x Per Week/30 Days Discharge Instructions: Use supplies as instructed; Kit contains: (15) Saline Bullets; (15) 3x3 Gauze; 15 pr Gloves Cleanser: Wound Cleanser 3 x Per Week/30 Days Discharge Instructions: Wash your hands with soap and water. Remove old dressing, discard into plastic bag and place into trash. Cleanse the wound with Wound Cleanser prior to applying a clean dressing using gauze sponges, not tissues or cotton balls. Do not scrub or use excessive force. Pat dry using gauze sponges, not tissue or cotton balls. Primary Dressing: IODOFLEX 0.9% Cadexomer Iodine Pad 3 x Per Week/30 Days Discharge Instructions: Apply Iodoflex to wound bed only as directed. Secondary Dressing: Zetuvit Plus Silicone Border Dressing 4x4 (in/in) 3 x Per Week/30 Days 1. We will switch over to Iodoflex which I think is a better option based on the surface of the wound and what were seen at this point. 2. I am also can recommend that we continue with the border foam/Zetuvit to cover. 3. I would recommend as well she continue to  monitor for any signs of infection if anything changes she should let me know soon as possible. We will see patient back for reevaluation in 1 week here in the clinic. If anything worsens or changes patient will contact our office for additional recommendations. Electronic Signature(s) Signed: 10/06/2021 2:05:06 PM By: Worthy Keeler PA-C Entered By: Worthy Keeler on 10/06/2021 14:05:06 [name] ([id number]) -------------------------------------------------------------------------------- SuperBill Details Patient Name: [name]. Date of Service: 10/06/2021 Medical Record Number: [id number] Patient Account Number: [id number] Date of Birth/Sex: [birth year] (80 y.o. F) Treating RN: Carlene Coria Primary Care Provider: Frazier Richards Other Clinician: Referring Provider: Frazier Richards Treating Provider/Extender: Skipper Cliche in Treatment: 2 Diagnosis Coding ICD-10 Codes Code Description E11.622 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 J44.9 Chronic obstructive pulmonary disease, unspecified I50.42 Chronic combined systolic (congestive) and diastolic (congestive) heart failure Z99.81 Dependence on supplemental oxygen Facility Procedures CPT4 Code: [id number] Description: 99213 - WOUND CARE VISIT-LEV 3 EST PT Modifier: Quantity: 1 Physician Procedures CPT4 Code: 2482500 Description: 99214 - WC PHYS LEVEL 4 - EST PT Modifier: Quantity: 1 CPT4 Code: Description: ICD-10 Diagnosis Description E11.622 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 J44.9 Chronic obstructive pulmonary disease, unspecified I50.42 Chronic combined  systolic (congestive) and diastolic (congestive) hear Modifier: t failure Quantity: Electronic Signature(s) Signed: 10/06/2021 2:05:18 PM By: Worthy Keeler PA-C Entered By: Worthy Keeler on 10/06/2021 14:05:17

## 2021-10-12 NOTE — Progress Notes (Signed)
AVEENA, BARI ([id number]) Visit Report for 10/06/2021 Arrival Information Details Patient Name: Jasmine, Montoya. Date of Service: 10/06/2021 1:15 PM Medical Record Number: [id number] Patient Account Number: [id number] Date of Birth/Sex: 01-24-1941 (80 y.o. F) Treating RN: Carlene Coria Primary Care Sladen Plancarte: Frazier Richards Other Clinician: Referring Casey Fye: Frazier Richards Treating Johathan Province/Extender: Skipper Cliche in Treatment: 2 Visit Information History Since Last Visit All ordered tests and consults were completed: No Patient Arrived: Ambulatory Added or deleted any medications: No Arrival Time: 13:25 Any new allergies or adverse reactions: No Accompanied By: self Had a fall or experienced change in No Transfer Assistance: None activities of daily living that may affect Patient Identification Verified: Yes risk of falls: Secondary Verification Process Completed: Yes Signs or symptoms of abuse/neglect since last visito No Patient Requires Transmission-Based Precautions: No Hospitalized since last visit: No Patient Has Alerts: No Implantable device outside of the clinic excluding No cellular tissue based products placed in the center since last visit: Has Dressing in Place as Prescribed: Yes Pain Present Now: No Electronic Signature(s) Signed: 10/12/2021 1:01:44 PM By: Carlene Coria RN Entered By: Carlene Coria on 10/06/2021 13:30:15 [name] ([id number]) -------------------------------------------------------------------------------- Clinic Level of Care Assessment Details Patient Name: [name]. Date of Service: 10/06/2021 1:15 PM Medical Record Number: [id number] Patient Account Number: [id number] Date of Birth/Sex: March 25, 1941 (80 y.o. F) Treating RN: Carlene Coria Primary Care Takoya Jonas: Frazier Richards Other Clinician: Referring Surya Schroeter: Frazier Richards Treating Ithan Touhey/Extender: Skipper Cliche in Treatment: 2 Clinic Level of  Care Assessment Items TOOL 4 Quantity Score X - Use when only an EandM is performed on FOLLOW-UP visit 1 0 ASSESSMENTS - Nursing Assessment / Reassessment X - Reassessment of Co-morbidities (includes updates in patient status) 1 10 X- 1 5 Reassessment of Adherence to Treatment Plan ASSESSMENTS - Wound and Skin Assessment / Reassessment X - Simple Wound Assessment / Reassessment - one wound 1 5 _0  - 0 Complex Wound Assessment / Reassessment - multiple wounds _1  - 0 Dermatologic / Skin Assessment (not related to wound area) ASSESSMENTS - Focused Assessment _2  - Circumferential Edema Measurements - multi extremities 0 _3  - 0 Nutritional Assessment / Counseling / Intervention _4  - 0 Lower Extremity Assessment (monofilament, tuning fork, pulses) _5  - 0 Peripheral Arterial Disease Assessment (using hand held doppler) ASSESSMENTS - Ostomy and/or Continence Assessment and Care _6  - Incontinence Assessment and Management 0 _7  - 0 Ostomy Care Assessment and Management (repouching, etc.) PROCESS - Coordination of Care X - Simple Patient / Family Education for ongoing care 1 15 _8  - 0 Complex (extensive) Patient / Family Education for ongoing care _9  - 0 Staff obtains Programmer, systems, Records, Test Results / Process Orders _10  - 0 Staff telephones HHA, Nursing Homes / Clarify orders / etc _11  - 0 Routine Transfer to another Facility (non-emergent condition) _12  - 0 Routine Hospital Admission (non-emergent condition) _13  - 0 New Admissions / Biomedical engineer / Ordering NPWT, Apligraf, etc. _14  - 0 Emergency Hospital Admission (emergent condition) X- 1 10 Simple Discharge Coordination _15  - 0 Complex (extensive) Discharge Coordination PROCESS - Special Needs _16  - Pediatric / Minor Patient Management 0 _17  - 0 Isolation Patient Management _18  - 0 Hearing / Language / Visual special needs _19  - 0 Assessment of Community assistance (transportation, D/C planning, etc.) _20  - 0 Additional  assistance / Altered mentation _21  - 0 Support Surface(s) Assessment (bed, cushion, seat, etc.) INTERVENTIONS - Wound Cleansing / Measurement [name], [name]. ([id number]) X- 1 5 Simple Wound Cleansing -  one wound _0  - 0 Complex Wound Cleansing - multiple wounds X- 1 5 Wound Imaging (photographs - any number of wounds) _1  - 0 Wound Tracing (instead of photographs) X- 1 5 Simple Wound Measurement - one wound _2  - 0 Complex Wound Measurement - multiple wounds INTERVENTIONS - Wound Dressings X - Small Wound Dressing one or multiple wounds 1 10 _3  - 0 Medium Wound Dressing one or multiple wounds _4  - 0 Large Wound Dressing one or multiple wounds X- 1 5 Application of Medications - topical <[id number]>_8<\/[id number]>_3  - 0 Application of Medications - injection INTERVENTIONS - Miscellaneous _6  - External ear exam 0 _7  - 0 Specimen Collection (cultures, biopsies, blood, body fluids, etc.) _8  - 0 Specimen(s) / Culture(s) sent or taken to Lab for analysis _9  - 0 Patient Transfer (multiple staff / Civil Service fast streamer / Similar devices) _10  - 0 Simple Staple / Suture removal (25 or less) _11  - 0 Complex Staple / Suture removal (26 or more) _12  - 0 Hypo / Hyperglycemic Management (close monitor of Blood Glucose) _13  - 0 Ankle / Brachial Index (ABI) - do not check if billed separately X- 1 5 Vital Signs Has the patient been seen at the hospital within the last three years: Yes Total Score: 80 Level Of Care: New/Established - Level 3 Electronic Signature(s) Signed: 10/12/2021 1:01:44 PM By: Carlene Coria RN Entered By: Carlene Coria on 10/06/2021 13:42:36 [name] ([id number]) -------------------------------------------------------------------------------- Encounter Discharge Information Details Patient Name: [name]. Date of Service: 10/06/2021 1:15 PM Medical Record Number: [id number] Patient Account Number: [id number] Date of Birth/Sex: September 24, 1941 (80 y.o. F) Treating RN: Carlene Coria Primary  Care Anslee Micheletti: Frazier Richards Other Clinician: Referring Osama Coleson: Frazier Richards Treating Kassidee Narciso/Extender: Skipper Cliche in Treatment: 2 Encounter Discharge Information Items Discharge Condition: Stable Ambulatory Status: Wheelchair Discharge Destination: Home Transportation: Private Auto Accompanied By: daughter Schedule Follow-up Appointment: Yes Clinical Summary of Care: Patient Declined Electronic Signature(s) Signed: 10/12/2021 1:01:44 PM By: Carlene Coria RN Entered By: Carlene Coria on 10/06/2021 13:43:36 [name] ([id number]) -------------------------------------------------------------------------------- Lower Extremity Assessment Details Patient Name: [name]. Date of Service: 10/06/2021 1:15 PM Medical Record Number: [id number] Patient Account Number: [id number] Date of Birth/Sex: Aug 13, 1941 (80 y.o. F) Treating RN: Carlene Coria Primary Care Jailene Cupit: Frazier Richards Other Clinician: Referring Norene Oliveri: Frazier Richards Treating Eletha Culbertson/Extender: Jeri Cos Weeks in Treatment: 2 Edema Assessment Assessed: [Left: No] [Right: No] Edema: [Left: Ye] [Right: s] Vascular Assessment Pulses: Dorsalis Pedis Palpable: [Right:Yes] Electronic Signature(s) Signed: 10/12/2021 1:01:44 PM By: Carlene Coria RN Entered By: Carlene Coria on 10/06/2021 13:38:20 [name] ([id number]) -------------------------------------------------------------------------------- Multi Wound Chart Details Patient Name: [name]. Date of Service: 10/06/2021 1:15 PM Medical Record Number: [id number] Patient Account Number: [id number] Date of Birth/Sex: 07-26-1941 (80 y.o. F) Treating RN: Carlene Coria Primary Care Aissa Lisowski: Frazier Richards Other Clinician: Referring Bassheva Flury: Frazier Richards Treating Haruo Stepanek/Extender: Skipper Cliche in Treatment: 2 Vital Signs Height(in): 65 Pulse(bpm): 73 Weight(lbs): 75 Blood Pressure(mmHg):  103/60 Body Mass Index(BMI): 16 Temperature(F): 98.2 Respiratory Rate(breaths/min): 18 Photos: [N/A:N/A] Wound Location: Right, Lateral Malleolus N/A N/A Wounding Event: Gradually Appeared N/A N/A Primary Etiology: Diabetic Wound/Ulcer of the Lower N/A N/A Extremity Comorbid History: Chronic Obstructive Pulmonary N/A N/A Disease (COPD), Congestive Heart Failure, Type II Diabetes Date Acquired: 05/21/2021 N/A N/A Weeks of Treatment: 2 N/A N/A Wound Status: Open N/A N/A Measurements L x W x D (cm) 0.5x0.5x0.2 N/A N/A Area (cm) : 0.196 N/A N/A Volume (cm) : 0.039 N/A N/A % Reduction in Area: -  24.80% N/A N/A % Reduction in Volume: -143.70% N/A N/A Classification: Grade 1 N/A N/A Exudate Amount: Medium N/A N/A Exudate Type: Serosanguineous N/A N/A Exudate Color: red, brown N/A N/A Granulation Amount: Large (67-100%) N/A N/A Granulation Quality: Red N/A N/A Necrotic Amount: None Present (0%) N/A N/A Exposed Structures: Fat Layer (Subcutaneous Tissue): N/A N/A Yes Fascia: No Tendon: No Muscle: No Joint: No Bone: No Epithelialization: None N/A N/A Treatment Notes Electronic Signature(s) Signed: 10/12/2021 1:01:44 PM By: Carlene Coria RN Entered By: Carlene Coria on 10/06/2021 13:40:06 [name] ([id number]) -------------------------------------------------------------------------------- [place] Details Patient Name: [name]. Date of Service: 10/06/2021 1:15 PM Medical Record Number: [id number] Patient Account Number: [id number] Date of Birth/Sex: 12-Mar-1941 (80 y.o. F) Treating RN: Carlene Coria Primary Care Tahjanae Blankenburg: Frazier Richards Other Clinician: Referring Deryck Hippler: Frazier Richards Treating Kelso Bibby/Extender: Skipper Cliche in Treatment: 2 Active Inactive Necrotic Tissue Nursing Diagnoses: Impaired tissue integrity related to necrotic/devitalized tissue Goals: Necrotic/devitalized tissue will be minimized in the wound  bed Date Initiated: 09/21/2021 Target Resolution Date: 09/21/2021 Goal Status: Active Patient/caregiver will verbalize understanding of reason and process for debridement of necrotic tissue Date Initiated: 09/21/2021 Target Resolution Date: 09/21/2021 Goal Status: Active Interventions: Assess patient pain level pre-, during and post procedure and prior to discharge Provide education on necrotic tissue and debridement process Treatment Activities: Apply topical anesthetic as ordered : 09/21/2021 Biologic debridement : 09/21/2021 Enzymatic debridement : 09/21/2021 Excisional debridement : 09/21/2021 Notes: Orientation to the Wound Care Program Nursing Diagnoses: Knowledge deficit related to the wound healing center program Goals: Patient/caregiver will verbalize understanding of the [place] Date Initiated: 09/21/2021 Target Resolution Date: 09/21/2021 Goal Status: Active Interventions: Provide education on orientation to the wound center Notes: Wound/Skin Impairment Nursing Diagnoses: Impaired tissue integrity Goals: Patient/caregiver will verbalize understanding of skin care regimen Date Initiated: 09/21/2021 Target Resolution Date: 09/21/2021 Goal Status: Active Ulcer/skin breakdown will have a volume reduction of 30% by week 4 Date Initiated: 09/21/2021 Target Resolution Date: 10/21/2021 Goal Status: Active Ulcer/skin breakdown will have a volume reduction of 50% by week 8 Date Initiated: 09/21/2021 Target Resolution Date: 11/21/2021 [name], [name] ([id number]) Goal Status: Active Ulcer/skin breakdown will have a volume reduction of 80% by week 12 Date Initiated: 09/21/2021 Target Resolution Date: 12/21/2021 Goal Status: Active Ulcer/skin breakdown will heal within 14 weeks Date Initiated: 09/21/2021 Target Resolution Date: 01/21/2022 Goal Status: Active Interventions: Assess patient/caregiver ability to obtain necessary supplies Assess patient/caregiver  ability to perform ulcer/skin care regimen upon admission and as needed Assess ulceration(s) every visit Provide education on ulcer and skin care Treatment Activities: Referred to DME Cassidie Veiga for dressing supplies : 09/21/2021 Skin care regimen initiated : 09/21/2021 Notes: Electronic Signature(s) Signed: 10/12/2021 1:01:44 PM By: Carlene Coria RN Entered By: Carlene Coria on 10/06/2021 13:39:55 [name] ([id number]) -------------------------------------------------------------------------------- Pain Assessment Details Patient Name: [name]. Date of Service: 10/06/2021 1:15 PM Medical Record Number: [id number] Patient Account Number: [id number] Date of Birth/Sex: July 23, 1941 (80 y.o. F) Treating RN: Carlene Coria Primary Care Felica Chargois: Frazier Richards Other Clinician: Referring Jadia Capers: Frazier Richards Treating Somaya Grassi/Extender: Skipper Cliche in Treatment: 2 Active Problems Location of Pain Severity and Description of Pain Patient Has Paino No Site Locations Pain Management and Medication Current Pain Management: Electronic Signature(s) Signed: 10/12/2021 1:01:44 PM By: Carlene Coria RN Entered By: Carlene Coria on 10/06/2021 13:30:49 [name] ([id number]) -------------------------------------------------------------------------------- Patient/Caregiver Education Details Patient Name: [name]. Date of Service: 10/06/2021 1:15 PM Medical Record Number: [id number] Patient  Account Number: [id number] Date of Birth/Gender: [birth year] (80 y.o. F) Treating RN: Carlene Coria Primary Care Physician: Frazier Richards Other Clinician: Referring Physician: Frazier Richards Treating Physician/Extender: Skipper Cliche in Treatment: 2 Education Assessment Education Provided To: Patient Education Topics Provided Wound/Skin Impairment: Methods: Explain/Verbal Responses: State content correctly Electronic Signature(s) Signed: 10/12/2021  1:01:44 PM By: Carlene Coria RN Entered By: Carlene Coria on 10/06/2021 13:42:52 [name] ([id number]) -------------------------------------------------------------------------------- Wound Assessment Details Patient Name: [name]. Date of Service: 10/06/2021 1:15 PM Medical Record Number: [id number] Patient Account Number: [id number] Date of Birth/Sex: 10-09-1941 (80 y.o. F) Treating RN: Carlene Coria Primary Care Zissy Hamlett: Frazier Richards Other Clinician: Referring Katye Valek: Frazier Richards Treating Suresh Audi/Extender: Skipper Cliche in Treatment: 2 Wound Status Wound Number: 1 Primary Diabetic Wound/Ulcer of the Lower Extremity Etiology: Wound Location: Right, Lateral Malleolus Wound Open Wounding Event: Gradually Appeared Status: Date Acquired: 05/21/2021 Comorbid Chronic Obstructive Pulmonary Disease (COPD), Weeks Of Treatment: 2 History: Congestive Heart Failure, Type II Diabetes Clustered Wound: No Photos Wound Measurements Length: (cm) 0.5 Width: (cm) 0.5 Depth: (cm) 0.2 Area: (cm) 0.196 Volume: (cm) 0.039 % Reduction in Area: -24.8% % Reduction in Volume: -143.7% Epithelialization: None Tunneling: No Undermining: No Wound Description Classification: Grade 1 Exudate Amount: Medium Exudate Type: Serosanguineous Exudate Color: red, brown Foul Odor After Cleansing: No Slough/Fibrino No Wound Bed Granulation Amount: Large (67-100%) Exposed Structure Granulation Quality: Red Fascia Exposed: No Necrotic Amount: None Present (0%) Fat Layer (Subcutaneous Tissue) Exposed: Yes Tendon Exposed: No Muscle Exposed: No Joint Exposed: No Bone Exposed: No Treatment Notes Wound #1 (Malleolus) Wound Laterality: Right, Lateral Cleanser [place] Ancillary Kit - 15 Day Supply Discharge Instruction: Use supplies as instructed; Kit contains: (15) Saline Bullets; (15) 3x3 Gauze; 15 pr Gloves Wound Cleanser Discharge Instruction: Wash your hands with soap  and water. Remove old dressing, discard into plastic bag and place into trash. Cleanse the wound with Wound Cleanser prior to applying a clean dressing using gauze sponges, not tissues or cotton balls. Do not [name], [name]. ([id number]) scrub or use excessive force. Pat dry using gauze sponges, not tissue or cotton balls. Peri-Wound Care Topical Primary Dressing IODOFLEX 0.9% Cadexomer Iodine Pad Discharge Instruction: Apply Iodoflex to wound bed only as directed. Secondary Dressing Zetuvit Plus Silicone Border Dressing 4x4 (in/in) oil emulsion Secured With Compression Wrap Compression Stockings Add-Ons Electronic Signature(s) Signed: 10/12/2021 1:01:44 PM By: Carlene Coria RN Entered By: Carlene Coria on 10/06/2021 13:37:39 [name] ([id number]) -------------------------------------------------------------------------------- [place] Details Patient Name: [name]. Date of Service: 10/06/2021 1:15 PM Medical Record Number: [id number] Patient Account Number: [id number] Date of Birth/Sex: 02-20-1941 (80 y.o. F) Treating RN: Carlene Coria Primary Care Humzah Harty: Frazier Richards Other Clinician: Referring Rhia Blatchford: Frazier Richards Treating Gwynn Chalker/Extender: Skipper Cliche in Treatment: 2 Vital Signs Time Taken: 13:30 Temperature (F): 98.2 Height (in): 65 Pulse (bpm): 89 Weight (lbs): 98 Respiratory Rate (breaths/min): 18 Body Mass Index (BMI): 16.3 Blood Pressure (mmHg): 103/60 Reference Range: 80 - 120 mg / dl Electronic Signature(s) Signed: 10/12/2021 1:01:44 PM By: Carlene Coria RN Entered By: Carlene Coria on 10/06/2021 13:30:42

## 2021-10-13 ENCOUNTER — Other Ambulatory Visit: Payer: Self-pay

## 2021-10-13 ENCOUNTER — Encounter: Payer: PPO | Admitting: Physician Assistant

## 2021-10-13 DIAGNOSIS — L97312 Non-pressure chronic ulcer of right ankle with fat layer exposed: Secondary | ICD-10-CM | POA: Diagnosis not present

## 2021-10-13 DIAGNOSIS — E11622 Type 2 diabetes mellitus with other skin ulcer: Secondary | ICD-10-CM | POA: Diagnosis not present

## 2021-10-13 NOTE — Progress Notes (Addendum)
MEHA, VIDRINE ([id number]) Visit Report for 10/13/2021 Chief Complaint Document Details Patient Name: [name], [name]. Date of Service: 10/13/2021 2:45 PM Medical Record Number: [id number] Patient Account Number: [id number] Date of Birth/Sex: 04/29/1941 (80 y.o. F) Treating RN: Dolan Amen Primary Care Provider: Frazier Richards Other Clinician: Referring Provider: Frazier Richards Treating Provider/Extender: Skipper Cliche in Treatment: 3 Information Obtained from: Patient Chief Complaint Right lateral ankle ulcer Electronic Signature(s) Signed: 10/13/2021 2:34:07 PM By: Worthy Keeler PA-C Entered By: Worthy Keeler on 10/13/2021 14:34:07 [name] ([id number]) -------------------------------------------------------------------------------- HPI Details Patient Name: [name]. Date of Service: 10/13/2021 2:45 PM Medical Record Number: [id number] Patient Account Number: [id number] Date of Birth/Sex: [birth year] (80 y.o. F) Treating RN: Dolan Amen Primary Care Provider: Frazier Richards Other Clinician: Referring Provider: Frazier Richards Treating Provider/Extender: Skipper Cliche in Treatment: 3 History of Present Illness HPI Description: 09/21/2021 upon evaluation today patient appears to be doing somewhat poorly in regard to her wound on her right lateral ankle. She has been tolerating the dressing changes without complication. Fortunately there does not not appear to be any signs of active infection systemically at this time which is great news. Likely also do not see any major issues which is good news. Nonetheless the biggest problem is that even with attempting to treat this and try to get things under control on her own she is really not been able to get this healed. She does tend to sleep on her right side exclusively due to the fact that she is not able to lay any other way due to pain. She has issues with her right hip. She is a  diabetic her last hemoglobin A1c was 5.6 however this should not be affecting her ability to heal she also had an MRI in July which was negative for osteomyelitis. Overall everything checks out okay but nonetheless she is still experiencing the open wound that she just cannot get closed. She does wear oxygen 24/7. She has a history of congestive heart failure, COPD, and again the diabetes mellitus type 2. 09/29/2021 upon evaluation today patient appears to be doing roughly the same medial movement better as far as some of the necrotic tissue on the surface of the wound is concerned it is kind of gently coming off little by little. With that being said I do not see any signs of active infection systemically at this point which is good news. 10/06/2021 upon evaluation today patient appears to be doing excellent in regard to her wound from an infection standpoint I do not see any issues here currently. Unfortunately she continues develop slough over the surface of the wound where using collagen I am not certain that skin to be the best way to go going forward. I really think she may do well with Iodoflex. 10/13/2021 upon evaluation today patient appears to be doing well with regard to her wound. I actually feel like this is showing signs of improvement with regard to the overall appearance I do believe the Iodoflex has been helpful. Electronic Signature(s) Signed: 10/13/2021 2:54:20 PM By: Worthy Keeler PA-C Entered By: Worthy Keeler on 10/13/2021 14:54:20 [name] ([id number]) -------------------------------------------------------------------------------- Physical Exam Details Patient Name: [name]. Date of Service: 10/13/2021 2:45 PM Medical Record Number: [id number] Patient Account Number: [id number] Date of Birth/Sex: 17-Mar-1941 (80 y.o. F) Treating RN: Dolan Amen Primary Care Provider: Frazier Richards Other Clinician: Referring Provider: Frazier Richards Treating Provider/Extender: Jeri Cos Weeks in Treatment: 3 Constitutional Well-nourished  and well-hydrated in no acute distress. Respiratory normal breathing without difficulty. Psychiatric this patient is able to make decisions and demonstrates good insight into disease process. Alert and Oriented x 3. pleasant and cooperative. Notes Upon inspection patient's wound bed showed some signs of necrotic tissue noted on the surface of the wound this is much softer and seems to be doing much better than what it was previous. Fortunately there does not appear to be any evidence of active infection which is great news and overall I am extremely pleased with where things stand today. No fevers, chills, nausea, vomiting, or diarrhea. Electronic Signature(s) Signed: 10/13/2021 2:54:51 PM By: Worthy Keeler PA-C Entered By: Worthy Keeler on 10/13/2021 14:54:51 [name] ([id number]) -------------------------------------------------------------------------------- Physician Orders Details Patient Name: [name]. Date of Service: 10/13/2021 2:45 PM Medical Record Number: [id number] Patient Account Number: [id number] Date of Birth/Sex: 01/06/1941 (80 y.o. F) Treating RN: Dolan Amen Primary Care Provider: Frazier Richards Other Clinician: Referring Provider: Frazier Richards Treating Provider/Extender: Skipper Cliche in Treatment: 3 Verbal / Phone Orders: No Diagnosis Coding ICD-10 Coding Code Description E11.622 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 J44.9 Chronic obstructive pulmonary disease, unspecified I50.42 Chronic combined systolic (congestive) and diastolic (congestive) heart failure Z99.81 Dependence on supplemental oxygen Follow-up Appointments o Return Appointment in 1 week. Bathing/ Shower/ Hygiene o May shower; gently cleanse wound with antibacterial soap,  rinse and pat dry prior to dressing wounds Wound Treatment Wound #1 - Malleolus Wound Laterality: Right, Lateral Cleanser: Soap and Water 3 x Per Week/30 Days Discharge Instructions: Gently cleanse wound with antibacterial soap, rinse and pat dry prior to dressing wounds Cleanser: Wound Cleanser 3 x Per Week/30 Days Discharge Instructions: Wash your hands with soap and water. Remove old dressing, discard into plastic bag and place into trash. Cleanse the wound with Wound Cleanser prior to applying a clean dressing using gauze sponges, not tissues or cotton balls. Do not scrub or use excessive force. Pat dry using gauze sponges, not tissue or cotton balls. Primary Dressing: IODOFLEX 0.9% Cadexomer Iodine Pad 3 x Per Week/30 Days Discharge Instructions: Apply Iodoflex to wound bed only as directed. Secondary Dressing: Zetuvit Plus Silicone Border Dressing 4x4 (in/in) 3 x Per Week/30 Days Discharge Instructions: Secure dressing Electronic Signature(s) Signed: 10/13/2021 4:04:33 PM By: Dolan Amen RN Signed: 10/13/2021 4:11:02 PM By: Worthy Keeler PA-C Entered By: Dolan Amen on 10/13/2021 14:45:56 [name] ([id number]) -------------------------------------------------------------------------------- Problem List Details Patient Name: [name]. Date of Service: 10/13/2021 2:45 PM Medical Record Number: [id number] Patient Account Number: [id number] Date of Birth/Sex: [birth year] (80 y.o. F) Treating RN: Dolan Amen Primary Care Provider: Frazier Richards Other Clinician: Referring Provider: Frazier Richards Treating Provider/Extender: Skipper Cliche in Treatment: 3 Active Problems ICD-10 Encounter Code Description Active Date MDM Diagnosis E11.622 Type 2 diabetes mellitus with other skin ulcer 09/21/2021 No Yes L97.312 Non-pressure chronic ulcer of right ankle with fat layer exposed 09/21/2021 No Yes J44.9 Chronic obstructive pulmonary disease, unspecified  09/21/2021 No Yes I50.42 Chronic combined systolic (congestive) and diastolic (congestive) heart 09/21/2021 No Yes failure Z99.81 Dependence on supplemental oxygen 09/21/2021 No Yes Inactive Problems Resolved Problems Electronic Signature(s) Signed: 10/13/2021 2:33:57 PM By: Worthy Keeler PA-C Entered By: Worthy Keeler on 10/13/2021 14:33:57 [name] ([id number]) -------------------------------------------------------------------------------- Progress Note Details Patient Name: [name]. Date of Service: 10/13/2021 2:45 PM Medical Record Number: [id number] Patient Account Number: [id number]  Date of Birth/Sex: 1941/02/22 (80 y.o. F) Treating RN: Dolan Amen Primary Care Provider: Frazier Richards Other Clinician: Referring Provider: Frazier Richards Treating Provider/Extender: Skipper Cliche in Treatment: 3 Subjective Chief Complaint Information obtained from Patient Right lateral ankle ulcer History of Present Illness (HPI) 09/21/2021 upon evaluation today patient appears to be doing somewhat poorly in regard to her wound on her right lateral ankle. She has been tolerating the dressing changes without complication. Fortunately there does not not appear to be any signs of active infection systemically at this time which is great news. Likely also do not see any major issues which is good news. Nonetheless the biggest problem is that even with attempting to treat this and try to get things under control on her own she is really not been able to get this healed. She does tend to sleep on her right side exclusively due to the fact that she is not able to lay any other way due to pain. She has issues with her right hip. She is a diabetic her last hemoglobin A1c was 5.6 however this should not be affecting her ability to heal she also had an MRI in July which was negative for osteomyelitis. Overall everything checks out okay but nonetheless she is still  experiencing the open wound that she just cannot get closed. She does wear oxygen 24/7. She has a history of congestive heart failure, COPD, and again the diabetes mellitus type 2. 09/29/2021 upon evaluation today patient appears to be doing roughly the same medial movement better as far as some of the necrotic tissue on the surface of the wound is concerned it is kind of gently coming off little by little. With that being said I do not see any signs of active infection systemically at this point which is good news. 10/06/2021 upon evaluation today patient appears to be doing excellent in regard to her wound from an infection standpoint I do not see any issues here currently. Unfortunately she continues develop slough over the surface of the wound where using collagen I am not certain that skin to be the best way to go going forward. I really think she may do well with Iodoflex. 10/13/2021 upon evaluation today patient appears to be doing well with regard to her wound. I actually feel like this is showing signs of improvement with regard to the overall appearance I do believe the Iodoflex has been helpful. Objective Constitutional Well-nourished and well-hydrated in no acute distress. Vitals Time Taken: 2:28 PM, Height: 65 in, Weight: 98 lbs, BMI: 16.3, Temperature: 98.3 F, Pulse: 83 bpm, Respiratory Rate: 18 breaths/min, Blood Pressure: 101/62 mmHg, Pulse Oximetry: 87 %. Respiratory normal breathing without difficulty. Psychiatric this patient is able to make decisions and demonstrates good insight into disease process. Alert and Oriented x 3. pleasant and cooperative. General Notes: Upon inspection patient's wound bed showed some signs of necrotic tissue noted on the surface of the wound this is much softer and seems to be doing much better than what it was previous. Fortunately there does not appear to be any evidence of active infection which is great news and overall I am extremely pleased  with where things stand today. No fevers, chills, nausea, vomiting, or diarrhea. Integumentary (Hair, Skin) Wound #1 status is Open. Original cause of wound was Gradually Appeared. The date acquired was: 05/21/2021. The wound has been in treatment 3 weeks. The wound is located on the Right,Lateral Malleolus. The wound measures 0.5cm length x 0.5cm width x 0.2cm  depth; 0.196cm^2 area and 0.039cm^3 volume. There is Fat Layer (Subcutaneous Tissue) exposed. There is no tunneling or undermining noted. There is a medium amount of serosanguineous drainage noted. There is no granulation within the wound bed. There is a large (67-100%) amount of necrotic tissue within the wound bed including Adherent Slough. 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 Bathing/ Shower/ Hygiene: May shower; gently cleanse wound with antibacterial soap, rinse and pat dry prior to dressing wounds WOUND #1: - Malleolus Wound Laterality: Right, Lateral Cleanser: Soap and Water 3 x Per Week/30 Days Discharge Instructions: Gently cleanse wound with antibacterial soap, rinse and pat dry prior to dressing wounds Cleanser: Wound Cleanser 3 x Per Week/30 Days Discharge Instructions: Wash your hands with soap and water. Remove old dressing, discard into plastic bag and place into trash. Cleanse the wound with Wound Cleanser prior to applying a clean dressing using gauze sponges, not tissues or cotton balls. Do not scrub or use excessive force. Pat dry using gauze sponges, not tissue or cotton balls. Primary Dressing: IODOFLEX 0.9% Cadexomer Iodine Pad 3 x Per Week/30  Days Discharge Instructions: Apply Iodoflex to wound bed only as directed. Secondary Dressing: Zetuvit Plus Silicone Border Dressing 4x4 (in/in) 3 x Per Week/30 Days Discharge Instructions: Secure dressing 1. Would recommend currently that we going continue with the wound care measures as before utilizing the Iodoflex I think this is good to be a good option for her to continue. 2. I am also can recommend that we have the patient continue with the border foam dressing to cover which I think is doing a good job. 3. I would also suggest the patient continue to elevate her leg is much as possible in order to ensure there is no swelling the right now I really do not see anything that seems to be exhibiting signs of significant edema. We will see patient back for reevaluation in 1 week here in the clinic. If anything worsens or changes patient will contact our office for additional recommendations. Electronic Signature(s) Signed: 10/13/2021 3:02:10 PM By: Worthy Keeler PA-C Entered By: Worthy Keeler on 10/13/2021 15:02:09 [name] ([id number]) -------------------------------------------------------------------------------- SuperBill Details Patient Name: [name]. Date of Service: 10/13/2021 Medical Record Number: [id number] Patient Account Number: [id number] Date of Birth/Sex: Mar 16, 1941 (80 y.o. F) Treating RN: Dolan Amen Primary Care Provider: Frazier Richards Other Clinician: Referring Provider: Frazier Richards Treating Provider/Extender: Skipper Cliche in Treatment: 3 Diagnosis Coding ICD-10 Codes Code Description E11.622 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 J44.9 Chronic obstructive pulmonary disease, unspecified I50.42 Chronic combined systolic (congestive) and diastolic (congestive) heart failure Z99.81 Dependence on supplemental oxygen Facility Procedures CPT4  Code: [id number] Description: 99213 - WOUND CARE VISIT-LEV 3 EST PT Modifier: Quantity: 1 Physician Procedures CPT4 Code: [id number] Description: 99213 - WC PHYS LEVEL 3 - EST PT Modifier: Quantity: 1 CPT4 Code: Description: ICD-10 Diagnosis Description E11.622 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 J44.9 Chronic obstructive pulmonary disease, unspecified I50.42 Chronic combined  systolic (congestive) and diastolic (congestive) hear Modifier: t failure Quantity: Electronic Signature(s) Signed: 10/13/2021 3:03:31 PM By:  Melburn Hake, Marsh Heckler PA-C Entered By: Worthy Keeler on 10/13/2021 15:03:31

## 2021-10-13 NOTE — Progress Notes (Signed)
SHAYANNA, THATCH ([id number]) Visit Report for 10/13/2021 Arrival Information Details Patient Name: [name], [name]. Date of Service: 10/13/2021 2:45 PM Medical Record Number: [id number] Patient Account Number: [id number] Date of Birth/Sex: 1941-06-05 (80 y.o. F) Treating RN: Dolan Amen Primary Care Joshlyn Beadle: Frazier Richards Other Clinician: Referring Jahsir Rama: Frazier Richards Treating Garlon Tuggle/Extender: Skipper Cliche in Treatment: 3 Visit Information History Since Last Visit Pain Present Now: No Patient Arrived: Wheel Chair Arrival Time: 14:26 Accompanied By: granddaughter Transfer Assistance: Manual Patient Identification Verified: Yes Secondary Verification Process Completed: Yes Patient Requires Transmission-Based Precautions: No Patient Has Alerts: No Electronic Signature(s) Signed: 10/13/2021 4:04:33 PM By: Dolan Amen RN Entered By: Dolan Amen on 10/13/2021 14:28:37 [name] ([id number]) -------------------------------------------------------------------------------- Clinic Level of Care Assessment Details Patient Name: [name]. Date of Service: 10/13/2021 2:45 PM Medical Record Number: [id number] Patient Account Number: [id number] Date of Birth/Sex: August 01, 1941 (80 y.o. F) Treating RN: Dolan Amen Primary Care Christhoper Busbee: Frazier Richards Other Clinician: Referring Kalayla Shadden: Frazier Richards Treating Glennice Marcos/Extender: Skipper Cliche in Treatment: 3 Clinic Level of Care Assessment Items TOOL 4 Quantity Score X - Use when only an EandM is performed on FOLLOW-UP visit 1 0 ASSESSMENTS - Nursing Assessment / Reassessment X - Reassessment of Co-morbidities (includes updates in patient status) 1 10 X- 1 5 Reassessment of Adherence to Treatment Plan ASSESSMENTS - Wound and Skin Assessment / Reassessment X - Simple Wound Assessment / Reassessment - one wound 1 5 []  - 0 Complex Wound Assessment / Reassessment - multiple  wounds []  - 0 Dermatologic / Skin Assessment (not related to wound area) ASSESSMENTS - Focused Assessment []  - Circumferential Edema Measurements - multi extremities 0 []  - 0 Nutritional Assessment / Counseling / Intervention []  - 0 Lower Extremity Assessment (monofilament, tuning fork, pulses) []  - 0 Peripheral Arterial Disease Assessment (using hand held doppler) ASSESSMENTS - Ostomy and/or Continence Assessment and Care []  - Incontinence Assessment and Management 0 []  - 0 Ostomy Care Assessment and Management (repouching, etc.) PROCESS - Coordination of Care X - Simple Patient / Family Education for ongoing care 1 15 []  - 0 Complex (extensive) Patient / Family Education for ongoing care []  - 0 Staff obtains Programmer, systems, Records, Test Results / Process Orders []  - 0 Staff telephones HHA, Nursing Homes / Clarify orders / etc []  - 0 Routine Transfer to another Facility (non-emergent condition) []  - 0 Routine Hospital Admission (non-emergent condition) []  - 0 New Admissions / Biomedical engineer / Ordering NPWT, Apligraf, etc. []  - 0 Emergency Hospital Admission (emergent condition) X- 1 10 Simple Discharge Coordination []  - 0 Complex (extensive) Discharge Coordination PROCESS - Special Needs []  - Pediatric / Minor Patient Management 0 []  - 0 Isolation Patient Management []  - 0 Hearing / Language / Visual special needs []  - 0 Assessment of Community assistance (transportation, D/C planning, etc.) []  - 0 Additional assistance / Altered mentation []  - 0 Support Surface(s) Assessment (bed, cushion, seat, etc.) INTERVENTIONS - Wound Cleansing / Measurement ALONNA, BARTLING. ([id number]) X- 1 5 Simple Wound Cleansing - one wound []  - 0 Complex Wound Cleansing - multiple wounds X- 1 5 Wound Imaging (photographs - any number of wounds) []  - 0 Wound Tracing (instead of photographs) X- 1 5 Simple Wound Measurement - one wound []  - 0 Complex Wound Measurement -  multiple wounds INTERVENTIONS - Wound Dressings []  - Small Wound Dressing one or multiple wounds 0 X- 1 15 Medium Wound Dressing one or multiple wounds []  - 0 Large Wound Dressing  one or multiple wounds []  - 0 Application of Medications - topical []  - 0 Application of Medications - injection INTERVENTIONS - Miscellaneous []  - External ear exam 0 []  - 0 Specimen Collection (cultures, biopsies, blood, body fluids, etc.) []  - 0 Specimen(s) / Culture(s) sent or taken to Lab for analysis []  - 0 Patient Transfer (multiple staff / Civil Service fast streamer / Similar devices) []  - 0 Simple Staple / Suture removal (25 or less) []  - 0 Complex Staple / Suture removal (26 or more) []  - 0 Hypo / Hyperglycemic Management (close monitor of Blood Glucose) []  - 0 Ankle / Brachial Index (ABI) - do not check if billed separately X- 1 5 Vital Signs Has the patient been seen at the hospital within the last three years: Yes Total Score: 80 Level Of Care: New/Established - Level 3 Electronic Signature(s) Signed: 10/13/2021 4:04:33 PM By: Dolan Amen RN Entered By: Dolan Amen on 10/13/2021 14:47:24 [name] ([id number]) -------------------------------------------------------------------------------- Encounter Discharge Information Details Patient Name: [name]. Date of Service: 10/13/2021 2:45 PM Medical Record Number: [id number] Patient Account Number: [id number] Date of Birth/Sex: January 03, 1941 (80 y.o. F) Treating RN: Dolan Amen Primary Care Luba Matzen: Frazier Richards Other Clinician: Referring Tashina Credit: Frazier Richards Treating Jazzlin Clements/Extender: Skipper Cliche in Treatment: 3 Encounter Discharge Information Items Discharge Condition: Stable Ambulatory Status: Wheelchair Discharge Destination: Home Transportation: Private Auto Accompanied By: granddaughter Schedule Follow-up Appointment: Yes Clinical Summary of Care: Electronic Signature(s) Signed: 10/13/2021  4:02:51 PM By: Dolan Amen RN Entered By: Dolan Amen on 10/13/2021 16:02:51 [name] ([id number]) -------------------------------------------------------------------------------- Lower Extremity Assessment Details Patient Name: [name]. Date of Service: 10/13/2021 2:45 PM Medical Record Number: [id number] Patient Account Number: [id number] Date of Birth/Sex: [birth year] (80 y.o. F) Treating RN: Dolan Amen Primary Care Elvy Mclarty: Frazier Richards Other Clinician: Referring Leina Babe: Frazier Richards Treating Parilee Hally/Extender: Jeri Cos Weeks in Treatment: 3 Edema Assessment Assessed: [Left: No] [Right: Yes] Edema: [Left: N] [Right: o] Vascular Assessment Pulses: Dorsalis Pedis Palpable: [Right:Yes] Electronic Signature(s) Signed: 10/13/2021 4:04:33 PM By: Dolan Amen RN Entered By: Dolan Amen on 10/13/2021 14:38:40 [name] ([id number]) -------------------------------------------------------------------------------- Multi Wound Chart Details Patient Name: [name]. Date of Service: 10/13/2021 2:45 PM Medical Record Number: [id number] Patient Account Number: [id number] Date of Birth/Sex: May 02, 1941 (80 y.o. F) Treating RN: Dolan Amen Primary Care Khalfani Weideman: Frazier Richards Other Clinician: Referring Carmine Carrozza: Frazier Richards Treating Andrea Ferrer/Extender: Skipper Cliche in Treatment: 3 Vital Signs Height(in): 65 Pulse(bpm): 43 Weight(lbs): 28 Blood Pressure(mmHg): 101/62 Body Mass Index(BMI): 16 Temperature(F): 98.3 Respiratory Rate(breaths/min): 18 Photos: [N/A:N/A] Wound Location: Right, Lateral Malleolus N/A N/A Wounding Event: Gradually Appeared N/A N/A Primary Etiology: Diabetic Wound/Ulcer of the Lower N/A N/A Extremity Comorbid History: Chronic Obstructive Pulmonary N/A N/A Disease (COPD), Congestive Heart Failure, Type II Diabetes Date Acquired: 05/21/2021 N/A N/A Weeks of Treatment: 3 N/A  N/A Wound Status: Open N/A N/A Measurements L x W x D (cm) 0.5x0.5x0.2 N/A N/A Area (cm) : 0.196 N/A N/A Volume (cm) : 0.039 N/A N/A % Reduction in Area: -24.80% N/A N/A % Reduction in Volume: -143.70% N/A N/A Classification: Grade 1 N/A N/A Exudate Amount: Medium N/A N/A Exudate Type: Serosanguineous N/A N/A Exudate Color: red, brown N/A N/A Granulation Amount: None Present (0%) N/A N/A Necrotic Amount: Large (67-100%) N/A N/A Exposed Structures: Fat Layer (Subcutaneous Tissue): N/A N/A Yes Fascia: No Tendon: No Muscle: No Joint: No Bone: No Epithelialization: None N/A N/A Treatment Notes Electronic Signature(s) Signed: 10/13/2021 4:04:33 PM By: Dolan Amen RN Entered By:  Dolan Amen on 10/13/2021 14:44:59 HELGA, ASBURY ([id number]) -------------------------------------------------------------------------------- [place] Details Patient Name: LAURENASHLEY, VIAR. Date of Service: 10/13/2021 2:45 PM Medical Record Number: [id number] Patient Account Number: [id number] Date of Birth/Sex: 1941-09-03 (80 y.o. F) Treating RN: Dolan Amen Primary Care Rhen Dossantos: Frazier Richards Other Clinician: Referring Karys Meckley: Frazier Richards Treating Kaison Mcparland/Extender: Skipper Cliche in Treatment: 3 Active Inactive Necrotic Tissue Nursing Diagnoses: Impaired tissue integrity related to necrotic/devitalized tissue Goals: Necrotic/devitalized tissue will be minimized in the wound bed Date Initiated: 09/21/2021 Target Resolution Date: 09/21/2021 Goal Status: Active Patient/caregiver will verbalize understanding of reason and process for debridement of necrotic tissue Date Initiated: 09/21/2021 Target Resolution Date: 09/21/2021 Goal Status: Active Interventions: Assess patient pain level pre-, during and post procedure and prior to discharge Provide education on necrotic tissue and debridement process Treatment Activities: Apply topical anesthetic  as ordered : 09/21/2021 Biologic debridement : 09/21/2021 Enzymatic debridement : 09/21/2021 Excisional debridement : 09/21/2021 Notes: Wound/Skin Impairment Nursing Diagnoses: Impaired tissue integrity Goals: Patient/caregiver will verbalize understanding of skin care regimen Date Initiated: 09/21/2021 Target Resolution Date: 09/21/2021 Goal Status: Active Ulcer/skin breakdown will have a volume reduction of 30% by week 4 Date Initiated: 09/21/2021 Target Resolution Date: 10/21/2021 Goal Status: Active Ulcer/skin breakdown will have a volume reduction of 50% by week 8 Date Initiated: 09/21/2021 Target Resolution Date: 11/21/2021 Goal Status: Active Ulcer/skin breakdown will have a volume reduction of 80% by week 12 Date Initiated: 09/21/2021 Target Resolution Date: 12/21/2021 Goal Status: Active Ulcer/skin breakdown will heal within 14 weeks Date Initiated: 09/21/2021 Target Resolution Date: 01/21/2022 Goal Status: Active Interventions: Assess patient/caregiver ability to obtain necessary supplies Assess patient/caregiver ability to perform ulcer/skin care regimen upon admission and as needed Assess ulceration(s) every visit Provide education on ulcer and skin care LARYN, VENNING ([id number]) Treatment Activities: Referred to DME Silvio Sausedo for dressing supplies : 09/21/2021 Skin care regimen initiated : 09/21/2021 Notes: Electronic Signature(s) Signed: 10/13/2021 4:04:33 PM By: Dolan Amen RN Entered By: Dolan Amen on 10/13/2021 14:44:45 [name] ([id number]) -------------------------------------------------------------------------------- Pain Assessment Details Patient Name: [name]. Date of Service: 10/13/2021 2:45 PM Medical Record Number: [id number] Patient Account Number: [id number] Date of Birth/Sex: 1941/09/05 (80 y.o. F) Treating RN: Dolan Amen Primary Care Arora Coakley: Frazier Richards Other Clinician: Referring Carys Malina: Frazier Richards Treating Estaban [place]/Extender: Skipper Cliche in Treatment: 3 Active Problems Location of Pain Severity and Description of Pain Patient Has Paino No Site Locations Pain Management and Medication Current Pain Management: Electronic Signature(s) Signed: 10/13/2021 4:04:33 PM By: Dolan Amen RN Entered By: Dolan Amen on 10/13/2021 14:30:56 [name] ([id number]) -------------------------------------------------------------------------------- Patient/Caregiver Education Details Patient Name: [name]. Date of Service: 10/13/2021 2:45 PM Medical Record Number: [id number] Patient Account Number: [id number] Date of Birth/Gender: Jun 06, 1941 (80 y.o. F) Treating RN: Dolan Amen Primary Care Physician: Frazier Richards Other Clinician: Referring Physician: Frazier Richards Treating Physician/Extender: Skipper Cliche in Treatment: 3 Education Assessment Education Provided To: Patient Education Topics Provided Wound/Skin Impairment: Methods: Explain/Verbal Responses: State content correctly Electronic Signature(s) Signed: 10/13/2021 4:04:33 PM By: Dolan Amen RN Entered By: Dolan Amen on 10/13/2021 14:47:40 [name] ([id number]) -------------------------------------------------------------------------------- Wound Assessment Details Patient Name: [name]. Date of Service: 10/13/2021 2:45 PM Medical Record Number: [id number] Patient Account Number: [id number] Date of Birth/Sex: [birth year] (80 y.o. F) Treating RN: Dolan Amen Primary Care Cali Cuartas: Frazier Richards Other Clinician: Referring Kolton Kienle: Frazier Richards Treating Donis Pinder/Extender: Skipper Cliche in Treatment: 3 Wound Status Wound Number: 1 Primary Diabetic Wound/Ulcer of the  Lower Extremity Etiology: Wound Location: Right, Lateral Malleolus Wound Open Wounding Event: Gradually Appeared Status: Date Acquired: 05/21/2021 Comorbid Chronic  Obstructive Pulmonary Disease (COPD), Weeks Of Treatment: 3 History: Congestive Heart Failure, Type II Diabetes Clustered Wound: No Photos Wound Measurements Length: (cm) 0.5 Width: (cm) 0.5 Depth: (cm) 0.2 Area: (cm) 0.196 Volume: (cm) 0.039 % Reduction in Area: -24.8% % Reduction in Volume: -143.7% Epithelialization: None Tunneling: No Undermining: No Wound Description Classification: Grade 1 Exudate Amount: Medium Exudate Type: Serosanguineous Exudate Color: red, brown Foul Odor After Cleansing: No Slough/Fibrino No Wound Bed Granulation Amount: None Present (0%) Exposed Structure Necrotic Amount: Large (67-100%) Fascia Exposed: No Necrotic Quality: Adherent Slough Fat Layer (Subcutaneous Tissue) Exposed: Yes Tendon Exposed: No Muscle Exposed: No Joint Exposed: No Bone Exposed: No Treatment Notes Wound #1 (Malleolus) Wound Laterality: Right, Lateral Cleanser Soap and Water Discharge Instruction: Gently cleanse wound with antibacterial soap, rinse and pat dry prior to dressing wounds Wound Cleanser Discharge Instruction: Wash your hands with soap and water. Remove old dressing, discard into plastic bag and place into trash. Cleanse the wound with Wound Cleanser prior to applying a clean dressing using gauze sponges, not tissues or cotton balls. Do not MALLISSA, LORENZEN. ([id number]) scrub or use excessive force. Pat dry using gauze sponges, not tissue or cotton balls. Peri-Wound Care Topical Primary Dressing IODOFLEX 0.9% Cadexomer Iodine Pad Discharge Instruction: Apply Iodoflex to wound bed only as directed. Secondary Dressing Zetuvit Plus Silicone Border Dressing 4x4 (in/in) Discharge Instruction: Secure dressing Secured With Compression Wrap Compression Stockings Add-Ons Electronic Signature(s) Signed: 10/13/2021 4:04:33 PM By: Dolan Amen RN Entered By: Dolan Amen on 10/13/2021 14:38:25 [name]  ([id number]) -------------------------------------------------------------------------------- [place] Details Patient Name: [name]. Date of Service: 10/13/2021 2:45 PM Medical Record Number: [id number] Patient Account Number: [id number] Date of Birth/Sex: December 27, 1941 (80 y.o. F) Treating RN: Dolan Amen Primary Care Cash Duce: Frazier Richards Other Clinician: Referring Felissa Blouch: Frazier Richards Treating Frances Joynt/Extender: Skipper Cliche in Treatment: 3 Vital Signs Time Taken: 14:28 Temperature (F): 98.3 Height (in): 65 Pulse (bpm): 83 Weight (lbs): 98 Respiratory Rate (breaths/min): 18 Body Mass Index (BMI): 16.3 Blood Pressure (mmHg): 101/62 Reference Range: 80 - 120 mg / dl Airway Pulse Oximetry (%): 87 Inhaled Oxygen Concentration (%): 3 Electronic Signature(s) Signed: 10/13/2021 4:04:33 PM By: Dolan Amen RN Entered By: Dolan Amen on 10/13/2021 14:30:33

## 2021-10-20 ENCOUNTER — Encounter: Payer: PPO | Admitting: Physician Assistant

## 2021-10-20 ENCOUNTER — Other Ambulatory Visit: Payer: Self-pay

## 2021-10-20 DIAGNOSIS — E11622 Type 2 diabetes mellitus with other skin ulcer: Secondary | ICD-10-CM | POA: Diagnosis not present

## 2021-10-20 DIAGNOSIS — L98492 Non-pressure chronic ulcer of skin of other sites with fat layer exposed: Secondary | ICD-10-CM | POA: Diagnosis not present

## 2021-10-20 NOTE — Progress Notes (Addendum)
CALLIOPE, DELANGEL ([id number]) Visit Report for 10/20/2021 Chief Complaint Document Details Patient Name: [name], [name]. Date of Service: 10/20/2021 2:30 PM Medical Record Number: [id number] Patient Account Number: [id number] Date of Birth/Sex: 30-Jan-1941 (80 y.o. F) Treating RN: Cornell Barman Primary Care Provider: Frazier Richards Other Clinician: Referring Provider: Frazier Richards Treating Provider/Extender: Skipper Cliche in Treatment: 4 Information Obtained from: Patient Chief Complaint Right lateral ankle ulcer and left hand skin tear Electronic Signature(s) Signed: 10/20/2021 3:03:29 PM By: Worthy Keeler PA-C Previous Signature: 10/20/2021 2:41:08 PM Version By: Worthy Keeler PA-C Entered By: Worthy Keeler on 10/20/2021 15:03:29 [name] ([id number]) -------------------------------------------------------------------------------- Debridement Details Patient Name: [name]. Date of Service: 10/20/2021 2:30 PM Medical Record Number: [id number] Patient Account Number: [id number] Date of Birth/Sex: [birth year] (80 y.o. F) Treating RN: Cornell Barman Primary Care Provider: Frazier Richards Other Clinician: Referring Provider: Frazier Richards Treating Provider/Extender: Skipper Cliche in Treatment: 4 Debridement Performed for Wound #2 Left Hand - Dorsum Assessment: Performed By: Physician Tommie Sams., PA-C Debridement Type: Debridement Level of Consciousness (Pre- Awake and Alert procedure): Pre-procedure Verification/Time Out Yes - 15:00 Taken: Total Area Debrided (L x W): 1.5 (cm) x 3 (cm) = 4.5 (cm) Tissue and other material Viable, Non-Viable, Subcutaneous, Skin: Dermis , Skin: Epidermis debrided: Level: Skin/Subcutaneous Tissue Debridement Description: Excisional Instrument: Forceps Bleeding: Moderate Hemostasis Achieved: Pressure Response to Treatment: Procedure was tolerated well Level of Consciousness (Post- Awake and  Alert procedure): Post Debridement Measurements of Total Wound Length: (cm) 1.5 Width: (cm) 3 Depth: (cm) 2 Volume: (cm) 7.069 Character of Wound/Ulcer Post Debridement: Stable Post Procedure Diagnosis Same as Pre-procedure Electronic Signature(s) Signed: 10/27/2021 8:38:10 AM By: Gretta Cool, BSN, RN, CWS, Kim RN, BSN Signed: 10/30/2021 3:59:15 PM By: Worthy Keeler PA-C Previous Signature: 10/20/2021 4:28:58 PM Version By: Worthy Keeler PA-C Previous Signature: 10/23/2021 4:30:36 PM Version By: Gretta Cool, BSN, RN, CWS, Kim RN, BSN Entered By: Gretta Cool, BSN, RN, CWS, Kim on 10/27/2021 08:38:09 [name] ([id number]) -------------------------------------------------------------------------------- HPI Details Patient Name: [name], [name]. Date of Service: 10/20/2021 2:30 PM Medical Record Number: [id number] Patient Account Number: [id number] Date of Birth/Sex: 12-28-1941 (80 y.o. F) Treating RN: Cornell Barman Primary Care Provider: Frazier Richards Other Clinician: Referring Provider: Frazier Richards Treating Provider/Extender: Skipper Cliche in Treatment: 4 History of Present Illness HPI Description: 09/21/2021 upon evaluation today patient appears to be doing somewhat poorly in regard to her wound on her right lateral ankle. She has been tolerating the dressing changes without complication. Fortunately there does not not appear to be any signs of active infection systemically at this time which is great news. Likely also do not see any major issues which is good news. Nonetheless the biggest problem is that even with attempting to treat this and try to get things under control on her own she is really not been able to get this healed. She does tend to sleep on her right side exclusively due to the fact that she is not able to lay any other way due to pain. She has issues with her right hip. She is a diabetic her last hemoglobin A1c was 5.6 however this should not be affecting  her ability to heal she also had an MRI in July which was negative for osteomyelitis. Overall everything checks out okay but nonetheless she is still experiencing the open wound that she just cannot get closed. She does wear oxygen 24/7. She has a history of congestive heart failure, COPD, and  again the diabetes mellitus type 2. 09/29/2021 upon evaluation today patient appears to be doing roughly the same medial movement better as far as some of the necrotic tissue on the surface of the wound is concerned it is kind of gently coming off little by little. With that being said I do not see any signs of active infection systemically at this point which is good news. 10/06/2021 upon evaluation today patient appears to be doing excellent in regard to her wound from an infection standpoint I do not see any issues here currently. Unfortunately she continues develop slough over the surface of the wound where using collagen I am not certain that skin to be the best way to go going forward. I really think she may do well with Iodoflex. 10/13/2021 upon evaluation today patient appears to be doing well with regard to her wound. I actually feel like this is showing signs of improvement with regard to the overall appearance I do believe the Iodoflex has been helpful. 10/20/2021 upon evaluation today patient appears to be doing a little better in regard to her ankle ulcer. With that being said she has a new wound on her hand as well due to skin tear that occurred on the way home last week after I saw her. Fortunately there does not appear to be any evidence of active infection at this time. No fevers, chills, nausea, vomiting, or diarrhea. Electronic Signature(s) Signed: 10/20/2021 3:21:21 PM By: Worthy Keeler PA-C Entered By: Worthy Keeler on 10/20/2021 15:21:21 [name] ([id number]) -------------------------------------------------------------------------------- Physical Exam Details Patient Name:  [name]. Date of Service: 10/20/2021 2:30 PM Medical Record Number: [id number] Patient Account Number: [id number] Date of Birth/Sex: 15-Oct-1941 (80 y.o. F) Treating RN: Cornell Barman Primary Care Provider: Frazier Richards Other Clinician: Referring Provider: Frazier Richards Treating Provider/Extender: Skipper Cliche in Treatment: 4 Constitutional Well-nourished and well-hydrated in no acute distress. Respiratory normal breathing without difficulty. Psychiatric this patient is able to make decisions and demonstrates good insight into disease process. Alert and Oriented x 3. pleasant and cooperative. Notes Upon inspection patient's wound bed actually showed signs of good granulation and epithelization at this point. Fortunately there does not appear to be any evidence of systemic infection which is great news and overall I am extremely pleased with where we stand currently. No fevers, chills, nausea, vomiting, or diarrhea. Electronic Signature(s) Signed: 10/20/2021 3:21:38 PM By: Worthy Keeler PA-C Entered By: Worthy Keeler on 10/20/2021 15:21:38 [name] ([id number]) -------------------------------------------------------------------------------- Physician Orders Details Patient Name: [name]. Date of Service: 10/20/2021 2:30 PM Medical Record Number: [id number] Patient Account Number: [id number] Date of Birth/Sex: [birth year] (80 y.o. F) Treating RN: Cornell Barman Primary Care Provider: Frazier Richards Other Clinician: Referring Provider: Frazier Richards Treating Provider/Extender: Skipper Cliche in Treatment: 4 Verbal / Phone Orders: No Diagnosis Coding ICD-10 Coding Code Description E11.622 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 S61.412A Laceration without foreign body of left hand, initial encounter J44.9 Chronic obstructive pulmonary disease,  unspecified I50.42 Chronic combined systolic (congestive) and diastolic (congestive) heart failure Z99.81 Dependence on supplemental oxygen Follow-up Appointments o Return Appointment in 1 week. Bathing/ Shower/ Hygiene o May shower; gently cleanse wound with antibacterial soap, rinse and pat dry prior to dressing wounds Wound Treatment Wound #1 - Malleolus Wound Laterality: Right, Lateral Cleanser: Soap and Water 3 x Per Week/30 Days Discharge Instructions: Gently cleanse wound with antibacterial soap,  rinse and pat dry prior to dressing wounds Primary Dressing: Xeroform-HBD 2x2 (in/in) 3 x Per Week/30 Days Discharge Instructions: Apply Xeroform-HBD 2x2 (in/in) as directed Secondary Dressing: Non-Adherent Pad 3x8 (in/in) 3 x Per Week/30 Days Secured With: Conforming Stretch Gauze Bandage 4x75 (in/in) 3 x Per Week/30 Days Discharge Instructions: Apply as directed Wound #2 - Hand - Dorsum Wound Laterality: Left Cleanser: Soap and Water 3 x Per Week/30 Days Discharge Instructions: Gently cleanse wound with antibacterial soap, rinse and pat dry prior to dressing wounds Primary Dressing: Xeroform-HBD 2x2 (in/in) 3 x Per Week/30 Days Discharge Instructions: Apply Xeroform-HBD 2x2 (in/in) as directed Secondary Dressing: Non-Adherent Pad 3x8 (in/in) 3 x Per Week/30 Days Secured With: Conforming Stretch Gauze Bandage 4x75 (in/in) 3 x Per Week/30 Days Discharge Instructions: Apply as directed Electronic Signature(s) Signed: 10/20/2021 4:28:58 PM By: Worthy Keeler PA-C Signed: 10/23/2021 4:30:36 PM By: Gretta Cool, BSN, RN, CWS, Kim RN, BSN Entered By: Gretta Cool, BSN, RN, CWS, Kim on 10/20/2021 15:23:27 [name] ([id number]) -------------------------------------------------------------------------------- Problem List Details Patient Name: [name]. Date of Service: 10/20/2021 2:30 PM Medical Record Number: [id number] Patient Account Number: [id number] Date of Birth/Sex: July 30, 1941  (80 y.o. F) Treating RN: Cornell Barman Primary Care Provider: Frazier Richards Other Clinician: Referring Provider: Frazier Richards Treating Provider/Extender: Skipper Cliche in Treatment: 4 Active Problems ICD-10 Encounter Code Description Active Date MDM Diagnosis E11.622 Type 2 diabetes mellitus with other skin ulcer 09/21/2021 No Yes L97.312 Non-pressure chronic ulcer of right ankle with fat layer exposed 09/21/2021 No Yes S61.412A Laceration without foreign body of left hand, initial encounter 10/20/2021 No Yes J44.9 Chronic obstructive pulmonary disease, unspecified 09/21/2021 No Yes I50.42 Chronic combined systolic (congestive) and diastolic (congestive) heart 09/21/2021 No Yes failure Z99.81 Dependence on supplemental oxygen 09/21/2021 No Yes Inactive Problems Resolved Problems Electronic Signature(s) Signed: 10/20/2021 3:03:10 PM By: Worthy Keeler PA-C Previous Signature: 10/20/2021 2:41:03 PM Version By: Worthy Keeler PA-C Entered By: Worthy Keeler on 10/20/2021 15:03:10 [name] ([id number]) -------------------------------------------------------------------------------- Progress Note Details Patient Name: [name]. Date of Service: 10/20/2021 2:30 PM Medical Record Number: [id number] Patient Account Number: [id number] Date of Birth/Sex: 1941/04/27 (80 y.o. F) Treating RN: Cornell Barman Primary Care Provider: Frazier Richards Other Clinician: Referring Provider: Frazier Richards Treating Provider/Extender: Skipper Cliche in Treatment: 4 Subjective Chief Complaint Information obtained from Patient Right lateral ankle ulcer and left hand skin tear History of Present Illness (HPI) 09/21/2021 upon evaluation today patient appears to be doing somewhat poorly in regard to her wound on her right lateral ankle. She has been tolerating the dressing changes without complication. Fortunately there does not not appear to be any signs of active  infection systemically at this time which is great news. Likely also do not see any major issues which is good news. Nonetheless the biggest problem is that even with attempting to treat this and try to get things under control on her own she is really not been able to get this healed. She does tend to sleep on her right side exclusively due to the fact that she is not able to lay any other way due to pain. She has issues with her right hip. She is a diabetic her last hemoglobin A1c was 5.6 however this should not be affecting her ability to heal she also had an MRI in July which was negative for osteomyelitis. Overall everything checks out okay but nonetheless she is still experiencing the open wound that she just cannot get closed.  She does wear oxygen 24/7. She has a history of congestive heart failure, COPD, and again the diabetes mellitus type 2. 09/29/2021 upon evaluation today patient appears to be doing roughly the same medial movement better as far as some of the necrotic tissue on the surface of the wound is concerned it is kind of gently coming off little by little. With that being said I do not see any signs of active infection systemically at this point which is good news. 10/06/2021 upon evaluation today patient appears to be doing excellent in regard to her wound from an infection standpoint I do not see any issues here currently. Unfortunately she continues develop slough over the surface of the wound where using collagen I am not certain that skin to be the best way to go going forward. I really think she may do well with Iodoflex. 10/13/2021 upon evaluation today patient appears to be doing well with regard to her wound. I actually feel like this is showing signs of improvement with regard to the overall appearance I do believe the Iodoflex has been helpful. 10/20/2021 upon evaluation today patient appears to be doing a little better in regard to her ankle ulcer. With that being said  she has a new wound on her hand as well due to skin tear that occurred on the way home last week after I saw her. Fortunately there does not appear to be any evidence of active infection at this time. No fevers, chills, nausea, vomiting, or diarrhea. Objective Constitutional Well-nourished and well-hydrated in no acute distress. Vitals Time Taken: 2:47 PM, Height: 65 in, Weight: 98 lbs, BMI: 16.3, Temperature: 98.1 F, Pulse: 84 bpm, Respiratory Rate: 18 breaths/min, Blood Pressure: 108/64 mmHg, Pulse Oximetry: 89 %. Respiratory normal breathing without difficulty. Psychiatric this patient is able to make decisions and demonstrates good insight into disease process. Alert and Oriented x 3. pleasant and cooperative. General Notes: Upon inspection patient's wound bed actually showed signs of good granulation and epithelization at this point. Fortunately there does not appear to be any evidence of systemic infection which is great news and overall I am extremely pleased with where we stand currently. No fevers, chills, nausea, vomiting, or diarrhea. Integumentary (Hair, Skin) Wound #1 status is Open. Original cause of wound was Gradually Appeared. The date acquired was: 05/21/2021. The wound has been in treatment 4 weeks. The wound is located on the Right,Lateral Malleolus. The wound measures 0.4cm length x 0.4cm width x 0.2cm depth; 0.126cm^2 area and 0.025cm^3 volume. There is Fat Layer (Subcutaneous Tissue) exposed. There is a medium amount of serosanguineous drainage noted. There is no granulation within the wound bed. There is a large (67-100%) amount of necrotic tissue within the wound bed including Adherent Slough. Wound #2 status is Open. Original cause of wound was Trauma. The date acquired was: 10/13/2021. The wound is located on the Left Hand - MYLENA, SEDBERRY. ([id number]) Dorsum. The wound measures 1.5cm length x 3cm width x 0.1cm depth; 3.534cm^2 area and 0.353cm^3 volume. There is  Fat Layer (Subcutaneous Tissue) exposed. There is no tunneling or undermining noted. There is a medium amount of serous drainage noted. There is medium (34-66%) pink granulation within the wound bed. There is a small (1-33%) amount of necrotic tissue within the wound bed including Adherent Slough. 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 There was a Excisional Skin/Subcutaneous Tissue Debridement with a total area of 4.5 sq cm performed by Tommie Sams., PA-C. With the following instrument(s): Forceps to remove Viable and Non-Viable tissue/material. Material removed includes Subcutaneous Tissue, Skin: Dermis, and Skin: Epidermis. No specimens were taken. A time out was conducted at 15:00, prior to the start of the procedure. A Moderate amount of bleeding was controlled with Pressure. The procedure was tolerated well. Post Debridement Measurements: 1.5cm length x 3cm width x 2cm depth; 7.069cm^3 volume. Character of Wound/Ulcer Post Debridement is stable. Post procedure Diagnosis Wound #2: Same as Pre-Procedure Plan Follow-up Appointments: Return Appointment in 1 week. Bathing/ Shower/ Hygiene: May shower; gently cleanse wound with antibacterial soap, rinse and pat dry prior to dressing wounds WOUND #1: - Malleolus Wound Laterality: Right, Lateral Cleanser: Soap and Water 3 x Per Week/30 Days Discharge Instructions: Gently cleanse wound with antibacterial soap, rinse and pat dry prior to dressing wounds Primary Dressing: Xeroform-HBD 2x2 (in/in) 3 x  Per Week/30 Days Discharge Instructions: Apply Xeroform-HBD 2x2 (in/in) as directed Secondary Dressing: Non-Adherent Pad 3x8 (in/in) 3 x Per Week/30 Days Secured With: Conforming Stretch Gauze Bandage 4x75 (in/in) 3 x Per Week/30 Days Discharge Instructions: Apply as directed WOUND #2: - Hand - Dorsum Wound Laterality: Left Cleanser: Soap and Water 3 x Per Week/30 Days Discharge Instructions: Gently cleanse wound with antibacterial soap, rinse and pat dry prior to dressing wounds Primary Dressing: Xeroform-HBD 2x2 (in/in) 3 x Per Week/30 Days Discharge Instructions: Apply Xeroform-HBD 2x2 (in/in) as directed Secondary Dressing: Non-Adherent Pad 3x8 (in/in) 3 x Per Week/30 Days Secured With: Conforming Stretch Gauze Bandage 4x75 (in/in) 3 x Per Week/30 Days Discharge Instructions: Apply as directed 1. Would recommend that we continue the wound care measures as before and the patient is in agreement the plan this includes the use of the Xeroform gauze we been using this on the hand now going forward and I think this may be something to use for the wound on the ankle as well and see if this will #1 not hurt as bad a number to keep things little bit more moist so what dry out that seem to been an issue with the Iodoflex. The patient is in agreement with giving this a trial. 2. Also can recommend that we continue with ABD pad and roll gauze to secure in place using traction at to hold everything secure. We will see patient back for reevaluation in 1 week here in the clinic. If anything worsens or changes patient will contact our office for additional recommendations. [name], [name] ([id number]) Electronic Signature(s) Signed: 11/01/2021 8:10:57 AM By: Gretta Cool, BSN, RN, CWS, Kim RN, BSN Signed: 12/22/2021 12:11:21 PM By: Worthy Keeler PA-C Previous Signature: 10/25/2021 8:10:03 AM Version By: Gretta Cool BSN, RN, CWS, Kim RN, BSN Previous Signature: 10/25/2021 4:54:49 PM Version By: Worthy Keeler  PA-C Previous Signature: 10/20/2021 3:22:32 PM Version By: Worthy Keeler PA-C Entered By: Gretta Cool BSN, RN, CWS, Kim on 11/01/2021 08:10:57 [name] ([id number]) -------------------------------------------------------------------------------- SuperBill Details Patient Name: [name], [name]. Date of Service: 10/20/2021 Medical Record Number: [id number] Patient Account Number: [id number] Date of Birth/Sex: 11-09-1941 (80 y.o. 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.81 Dependence on supplemental oxygen Facility Procedures CPT4 Code: 58316742 Description: 55258 - DEB SUBQ TISSUE 20 SQ CM/< Modifier: Quantity: 1 CPT4 Code: Description: ICD-10 Diagnosis Description S61.412A Laceration without foreign body of left hand, initial encounter Modifier: Quantity: Physician Procedures CPT4 Code: [id number] Description: 99214 - WC PHYS LEVEL 4 - EST PT Modifier: 25 Quantity: 1 CPT4 Code: Description: ICD-10 Diagnosis Description E11.622 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 S61.412A Laceration without foreign body of left hand, initial encounter J44.9  Chronic obstructive pulmonary disease, unspecified Modifier: Quantity: CPT4 Code: [id number] Description: 11042 - WC PHYS SUBQ TISS 20 SQ CM Modifier: Quantity: 1 CPT4 Code: Description: ICD-10 Diagnosis Description  S61.412A Laceration without foreign body of left hand, initial encounter Modifier: Quantity: Electronic Signature(s) Signed: 10/20/2021 3:22:56 PM By: Worthy Keeler PA-C Entered By: Worthy Keeler on 10/20/2021 15:22:56

## 2021-10-20 NOTE — Progress Notes (Addendum)
KAYSEY, BERNDT ([id number]) Visit Report for 10/20/2021 Arrival Information Details Patient Name: [name], [name]. Date of Service: 10/20/2021 2:30 PM Medical Record Number: [id number] Patient Account Number: [id number] Date of Birth/Sex: 26-May-1941 (80 y.o. F) Treating RN: Cornell Barman Primary Care Ginia Rudell: Frazier Richards Other Clinician: Referring Abigaelle Verley: Frazier Richards Treating Clydine Parkison/Extender: Skipper Cliche in Treatment: 4 Visit Information History Since Last Visit Added or deleted any medications: No Patient Arrived: Ambulatory Has Dressing in Place as Prescribed: Yes Arrival Time: 14:42 Pain Present Now: No Accompanied By: self Transfer Assistance: Manual Patient Identification Verified: Yes Secondary Verification Process Completed: Yes Patient Requires Transmission-Based Precautions: No Patient Has Alerts: No Electronic Signature(s) Signed: 10/23/2021 4:30:36 PM By: Gretta Cool, BSN, RN, CWS, Kim RN, BSN Entered By: Gretta Cool, BSN, RN, CWS, Kim on 10/20/2021 14:44:59 [name] ([id number]) -------------------------------------------------------------------------------- Encounter Discharge Information Details Patient Name: [name]. Date of Service: 10/20/2021 2:30 PM Medical Record Number: [id number] Patient Account Number: [id number] Date of Birth/Sex: 09-14-1941 (80 y.o. F) Treating RN: Cornell Barman Primary Care Carren Blakley: Frazier Richards Other Clinician: Referring Irie Fiorello: Frazier Richards Treating Riniyah Speich/Extender: Skipper Cliche in Treatment: 4 Encounter Discharge Information Items Post Procedure Vitals Discharge Condition: Stable Unable to obtain vitals Reason: limited time Ambulatory Status: Wheelchair Discharge Destination: Home Transportation: Private Auto Accompanied By: daughter Schedule Follow-up Appointment: Yes Clinical Summary of Care: Electronic Signature(s) Signed: 10/23/2021 4:30:36 PM By: Gretta Cool, BSN, RN, CWS, Kim  RN, BSN Entered By: Gretta Cool, BSN, RN, CWS, Kim on 10/20/2021 15:24:31 [name] ([id number]) -------------------------------------------------------------------------------- Lower Extremity Assessment Details Patient Name: [name]. Date of Service: 10/20/2021 2:30 PM Medical Record Number: [id number] Patient Account Number: [id number] Date of Birth/Sex: Feb 19, 1941 (80 y.o. F) Treating RN: Cornell Barman Primary Care Athaliah Baumbach: Frazier Richards Other Clinician: Referring Jabier Deese: Frazier Richards Treating Evelisse Szalkowski/Extender: Skipper Cliche in Treatment: 4 Edema Assessment Assessed: [Left: No] [Right: No] Edema: [Left: No] [Right: No] Electronic Signature(s) Signed: 11/01/2021 8:09:51 AM By: Gretta Cool, BSN, RN, CWS, Kim RN, BSN Entered By: Gretta Cool, BSN, RN, CWS, Kim on 11/01/2021 08:09:50 [name] ([id number]) -------------------------------------------------------------------------------- Multi Wound Chart Details Patient Name: [name]. Date of Service: 10/20/2021 2:30 PM Medical Record Number: [id number] Patient Account Number: [id number] Date of Birth/Sex: [birth year] (80 y.o. 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, Other N/A Extremity Comorbid History: Chronic Obstructive Pulmonary Chronic Obstructive Pulmonary N/A Disease (COPD), Congestive Heart Disease (COPD), Congestive Heart Failure, Type II Diabetes Failure, Type II  Diabetes Date Acquired: 05/21/2021 10/13/2021 N/A Weeks of Treatment: 4 0 N/A Wound Status: Open Open N/A Measurements L x W x D (cm) 0.4x0.4x0.2 1.5x3x0.1 N/A Area (cm) : 0.126 3.534 N/A Volume (cm) : 0.025 0.353 N/A % Reduction in Area: 19.70% 0.00% N/A % Reduction in Volume: -56.20% 0.00% N/A Classification: Grade 1 Full Thickness Without Exposed N/A Support Structures Exudate Amount: Medium Medium N/A Exudate Type: Serosanguineous Serous N/A Exudate Color: red, brown amber N/A Granulation Amount: None Present (0%) Medium (34-66%) N/A Granulation Quality: N/A Pink N/A Necrotic Amount: Large (67-100%) Small (1-33%) N/A Exposed Structures: Fat Layer (Subcutaneous Tissue): Fat Layer (Subcutaneous Tissue): N/A Yes Yes Fascia: No Fascia:  No Tendon: No Tendon: No Muscle: No Muscle: No Joint: No Joint: No Bone: No Bone: No Epithelialization: None None N/A Treatment Notes Wound #1 (Malleolus) Wound Laterality: Right, Lateral Cleanser Soap and Water Discharge Instruction: Gently cleanse wound with antibacterial soap, rinse and pat dry prior to dressing wounds Wound Cleanser YUMALAY, CIRCLE ([id number]) Discharge Instruction: Wash your hands with soap and water. Remove old dressing, discard into plastic bag and place into trash. Cleanse the wound with Wound Cleanser prior to applying a clean dressing using gauze sponges, not tissues or cotton balls. Do not scrub or use excessive force. Pat dry using gauze sponges, not tissue or cotton balls. Peri-Wound Care Topical Primary Dressing IODOFLEX 0.9% Cadexomer Iodine Pad Discharge Instruction: Apply Iodoflex to wound bed only as directed. Secondary Dressing Zetuvit Plus Silicone Border Dressing 4x4 (in/in) Discharge Instruction: Secure dressing Secured With Compression Wrap Compression Stockings Add-Ons Wound #2 (Hand - Dorsum) Wound Laterality: Left Cleanser Peri-Wound Care Topical Primary Dressing Secondary  Dressing Secured With Compression Wrap Compression Stockings Add-Ons Electronic Signature(s) Signed: 10/23/2021 4:30:36 PM By: Gretta Cool, BSN, RN, CWS, Kim RN, BSN Entered By: Gretta Cool, BSN, RN, CWS, Kim on 10/20/2021 15:21:16 [name] ([id number]) -------------------------------------------------------------------------------- [place] Details Patient Name: [name]. Date of Service: 10/20/2021 2:30 PM Medical Record Number: [id number] Patient Account Number: [id number] Date of Birth/Sex: July 10, 1941 (80 y.o. F) Treating RN: Cornell Barman Primary Care Airabella Barley: Frazier Richards Other Clinician: Referring Imanie Darrow: Frazier Richards Treating Betta Balla/Extender: Skipper Cliche in Treatment: 4 Active Inactive Necrotic Tissue Nursing Diagnoses: Impaired tissue integrity related to necrotic/devitalized tissue Goals: Necrotic/devitalized tissue will be minimized in the wound bed Date Initiated: 09/21/2021 Target Resolution Date: 09/21/2021 Goal Status: Active Patient/caregiver will verbalize understanding of reason and process for debridement of necrotic tissue Date Initiated: 09/21/2021 Target Resolution Date: 09/21/2021 Goal Status: Active Interventions: Assess patient pain level pre-, during and post procedure and prior to discharge Provide education on necrotic tissue and debridement process Treatment Activities: Apply topical anesthetic as ordered : 09/21/2021 Biologic debridement : 09/21/2021 Enzymatic debridement : 09/21/2021 Excisional debridement : 09/21/2021 Notes: Wound/Skin Impairment Nursing Diagnoses: Impaired tissue integrity Goals: Patient/caregiver will verbalize understanding of skin care regimen Date Initiated: 09/21/2021 Target Resolution Date: 09/21/2021 Goal Status: Active Ulcer/skin breakdown will have a volume reduction of 30% by week 4 Date Initiated: 09/21/2021 Target Resolution Date: 10/21/2021 Goal Status:  Active Ulcer/skin breakdown will have a volume reduction of 50% by week 8 Date Initiated: 09/21/2021 Target Resolution Date: 11/21/2021 Goal Status: Active Ulcer/skin breakdown will have a volume reduction of 80% by week 12 Date Initiated: 09/21/2021 Target Resolution Date: 12/21/2021 Goal Status: Active Ulcer/skin breakdown will heal within 14 weeks Date Initiated: 09/21/2021 Target Resolution Date: 01/21/2022 Goal Status: Active Interventions: Assess patient/caregiver ability to obtain necessary supplies Assess patient/caregiver ability to perform ulcer/skin care regimen upon admission and as needed Assess ulceration(s) every visit Provide education on ulcer and skin care TAVI, GAUGHRAN ([id number]) Treatment Activities: Referred to DME Arrabella Westerman for dressing supplies : 09/21/2021 Skin care regimen initiated : 09/21/2021 Notes: Electronic Signature(s) Signed: 10/23/2021 4:30:36 PM By: Gretta Cool, BSN, RN, CWS, Kim RN, BSN Entered By: Gretta Cool, BSN, RN, CWS, Kim on 10/20/2021 15:21:08 [name] ([id number]) -------------------------------------------------------------------------------- Pain Assessment Details Patient Name: [name]. Date of Service: 10/20/2021 2:30 PM Medical Record Number: [id number] Patient Account Number: [id number] Date of Birth/Sex: January 07, 1941 (80 y.o. F) Treating RN: Cornell Barman Primary Care Palak Tercero: Frazier Richards Other Clinician: Referring Hale Chalfin: Frazier Richards Treating Alexza Norbeck/Extender: Jeri Cos  Weeks in Treatment: 4 Active Problems Location of Pain Severity and Description of Pain Patient Has Paino No Site Locations Pain Management and Medication Current Pain Management: Electronic Signature(s) Signed: 10/23/2021 4:30:36 PM By: Gretta Cool, BSN, RN, CWS, Kim RN, BSN Entered By: Gretta Cool, BSN, RN, CWS, Kim on 10/20/2021 14:47:40 [name]  ([id number]) -------------------------------------------------------------------------------- Patient/Caregiver Education Details Patient Name: [name]. Date of Service: 10/20/2021 2:30 PM Medical Record Number: [id number] Patient Account Number: [id number] Date of Birth/Gender: 1941-04-29 (80 y.o. F) Treating RN: Cornell Barman Primary Care Physician: Frazier Richards Other Clinician: Referring Physician: Frazier Richards Treating Physician/Extender: Skipper Cliche in Treatment: 4 Education Assessment Education Provided To: Patient Education Topics Provided Wound Debridement: Handouts: Wound Debridement Methods: Demonstration, Explain/Verbal Responses: State content correctly Electronic Signature(s) Signed: 10/23/2021 4:30:36 PM By: Gretta Cool, BSN, RN, CWS, Kim RN, BSN Entered By: Gretta Cool, BSN, RN, CWS, Kim on 10/20/2021 15:23:49 [name] ([id number]) -------------------------------------------------------------------------------- Wound Assessment Details Patient Name: [name]. Date of Service: 10/20/2021 2:30 PM Medical Record Number: [id number] Patient Account Number: [id number] Date of Birth/Sex: [birth year] (80 y.o. F) Treating RN: Cornell Barman Primary Care Nirav Sweda: Frazier Richards Other Clinician: Referring Phuoc Huy: Frazier Richards Treating Tytiana Coles/Extender: Skipper Cliche in Treatment: 4 Wound Status Wound Number: 1 Primary Diabetic Wound/Ulcer of the Lower Extremity Etiology: Wound Location: Right, Lateral Malleolus Wound Open Wounding Event: Gradually Appeared Status: Date Acquired: 05/21/2021 Comorbid Chronic Obstructive Pulmonary Disease (COPD), Weeks Of Treatment: 4 History: Congestive Heart Failure, Type II Diabetes Clustered Wound: No Wound Measurements Length: (cm) 0.4 Width: (cm) 0.4 Depth: (cm) 0.2 Area: (cm) 0.126 Volume: (cm) 0.025 % Reduction in Area: 19.7% % Reduction in Volume: -56.2% Epithelialization:  None Wound Description Classification: Grade 1 Exudate Amount: Medium Exudate Type: Serosanguineous Exudate Color: red, brown Foul Odor After Cleansing: No Slough/Fibrino No Wound Bed Granulation Amount: None Present (0%) Exposed Structure Necrotic Amount: Large (67-100%) Fascia Exposed: No Necrotic Quality: Adherent Slough Fat Layer (Subcutaneous Tissue) Exposed: Yes Tendon Exposed: No Muscle Exposed: No Joint Exposed: No Bone Exposed: No Electronic Signature(s) Signed: 10/23/2021 4:30:36 PM By: Gretta Cool, BSN, RN, CWS, Kim RN, BSN Entered By: Gretta Cool, BSN, RN, CWS, Kim on 10/20/2021 14:58:02 [name] ([id number]) -------------------------------------------------------------------------------- Wound Assessment Details Patient Name: [name]. Date of Service: 10/20/2021 2:30 PM Medical Record Number: [id number] Patient Account Number: [id number] Date of Birth/Sex: Mar 16, 1941 (80 y.o. F) Treating RN: Cornell Barman Primary Care Brance Dartt: Frazier Richards Other Clinician: Referring Stacia Feazell: Frazier Richards Treating Rya Rausch/Extender: Skipper Cliche in Treatment: 4 Wound Status Wound Number: 2 Primary Trauma, Other Etiology: Wound Location: Left Hand - Dorsum Wound Open Wounding Event: Trauma Status: Date Acquired: 10/13/2021 Comorbid Chronic Obstructive Pulmonary Disease (COPD), Weeks Of Treatment: 0 History: Congestive Heart Failure, Type II Diabetes Clustered Wound: No Photos Wound Measurements Length: (cm) 1.5 Width: (cm) 3 Depth: (cm) 0.1 Area: (cm) 3.534 Volume: (cm) 0.353 % Reduction in Area: 0% % Reduction in Volume: 0% Epithelialization: None Tunneling: No Undermining: No Wound Description Classification: Full Thickness Without Exposed Support Structu Exudate Amount: Medium Exudate Type: Serous Exudate Color: amber res Foul Odor After Cleansing: No Slough/Fibrino No Wound Bed Granulation Amount: Medium (34-66%) Exposed  Structure Granulation Quality: Pink Fascia Exposed: No Necrotic Amount: Small (1-33%) Fat Layer (Subcutaneous Tissue) Exposed: Yes Necrotic Quality: Adherent Slough Tendon Exposed: No Muscle Exposed: No Joint Exposed: No Bone Exposed: No Electronic Signature(s) Signed: 10/23/2021 4:30:36 PM By: Gretta Cool, BSN, RN, CWS, Kim RN, BSN Entered By: Gretta Cool, BSN, RN, CWS, Kim on 10/20/2021 14:59:12 Sherman,  Kelli Hope ([id number]) -------------------------------------------------------------------------------- [place] Details Patient Name: MACIL, CRADY. Date of Service: 10/20/2021 2:30 PM Medical Record Number: [id number] Patient Account Number: [id number] Date of Birth/Sex: 14-Feb-1941 (80 y.o. F) Treating RN: Cornell Barman Primary Care Lovis More: Frazier Richards Other Clinician: Referring Lendon George: Frazier Richards Treating Jennalyn Cawley/Extender: Skipper Cliche in Treatment: 4 Vital Signs Time Taken: 14:47 Temperature (F): 98.1 Height (in): 65 Pulse (bpm): 84 Weight (lbs): 98 Respiratory Rate (breaths/min): 18 Body Mass Index (BMI): 16.3 Blood Pressure (mmHg): 108/64 Reference Range: 80 - 120 mg / dl Airway Pulse Oximetry (%): 89 Inhaled Oxygen Concentration (%): 2 Electronic Signature(s) Signed: 10/23/2021 4:30:36 PM By: Gretta Cool, BSN, RN, CWS, Kim RN, BSN Entered By: Gretta Cool, BSN, RN, CWS, Kim on 10/20/2021 14:47:34

## 2021-10-24 DIAGNOSIS — E785 Hyperlipidemia, unspecified: Secondary | ICD-10-CM | POA: Diagnosis not present

## 2021-10-24 DIAGNOSIS — R0609 Other forms of dyspnea: Secondary | ICD-10-CM | POA: Diagnosis not present

## 2021-10-24 DIAGNOSIS — J42 Unspecified chronic bronchitis: Secondary | ICD-10-CM | POA: Diagnosis not present

## 2021-10-24 DIAGNOSIS — I10 Essential (primary) hypertension: Secondary | ICD-10-CM | POA: Diagnosis not present

## 2021-10-24 DIAGNOSIS — N1831 Chronic kidney disease, stage 3a: Secondary | ICD-10-CM | POA: Diagnosis not present

## 2021-10-24 DIAGNOSIS — I5032 Chronic diastolic (congestive) heart failure: Secondary | ICD-10-CM | POA: Diagnosis not present

## 2021-10-24 DIAGNOSIS — E1122 Type 2 diabetes mellitus with diabetic chronic kidney disease: Secondary | ICD-10-CM | POA: Diagnosis not present

## 2021-10-24 DIAGNOSIS — E1169 Type 2 diabetes mellitus with other specified complication: Secondary | ICD-10-CM | POA: Diagnosis not present

## 2021-10-27 ENCOUNTER — Other Ambulatory Visit: Payer: Self-pay

## 2021-10-27 ENCOUNTER — Encounter: Payer: PPO | Admitting: Physician Assistant

## 2021-10-27 DIAGNOSIS — L98492 Non-pressure chronic ulcer of skin of other sites with fat layer exposed: Secondary | ICD-10-CM | POA: Diagnosis not present

## 2021-10-27 DIAGNOSIS — E11622 Type 2 diabetes mellitus with other skin ulcer: Secondary | ICD-10-CM | POA: Diagnosis not present

## 2021-10-27 DIAGNOSIS — L97312 Non-pressure chronic ulcer of right ankle with fat layer exposed: Secondary | ICD-10-CM | POA: Diagnosis not present

## 2021-10-27 NOTE — Progress Notes (Addendum)
BYANCA, KASPER ([id number]) Visit Report for 10/27/2021 Chief Complaint Document Details Patient Name: [name], [name]. Date of Service: 10/27/2021 2:30 PM Medical Record Number: [id number] Patient Account Number: [id number] Date of Birth/Sex: Jun 01, 1941 (80 y.o. F) Treating RN: Donnamarie Poag Primary Care Provider: Frazier Richards Other Clinician: Referring Provider: Frazier Richards Treating Provider/Extender: Skipper Cliche in Treatment: 5 Information Obtained from: Patient Chief Complaint Right lateral ankle ulcer and left hand skin tear Electronic Signature(s) Signed: 10/27/2021 3:05:55 PM By: Worthy Keeler PA-C Entered By: Worthy Keeler on 10/27/2021 15:05:55 [name] ([id number]) -------------------------------------------------------------------------------- HPI Details Patient Name: [name]. Date of Service: 10/27/2021 2:30 PM Medical Record Number: [id number] Patient Account Number: [id number] Date of Birth/Sex: [birth year] (80 y.o. F) Treating RN: Donnamarie Poag Primary Care Provider: Frazier Richards Other Clinician: Referring Provider: Frazier Richards Treating Provider/Extender: Skipper Cliche in Treatment: 5 History of Present Illness HPI Description: 09/21/2021 upon evaluation today patient appears to be doing somewhat poorly in regard to her wound on her right lateral ankle. She has been tolerating the dressing changes without complication. Fortunately there does not not appear to be any signs of active infection systemically at this time which is great news. Likely also do not see any major issues which is good news. Nonetheless the biggest problem is that even with attempting to treat this and try to get things under control on her own she is really not been able to get this healed. She does tend to sleep on her right side exclusively due to the fact that she is not able to lay any other way due to pain. She has issues with her  right hip. She is a diabetic her last hemoglobin A1c was 5.6 however this should not be affecting her ability to heal she also had an MRI in July which was negative for osteomyelitis. Overall everything checks out okay but nonetheless she is still experiencing the open wound that she just cannot get closed. She does wear oxygen 24/7. She has a history of congestive heart failure, COPD, and again the diabetes mellitus type 2. 09/29/2021 upon evaluation today patient appears to be doing roughly the same medial movement better as far as some of the necrotic tissue on the surface of the wound is concerned it is kind of gently coming off little by little. With that being said I do not see any signs of active infection systemically at this point which is good news. 10/06/2021 upon evaluation today patient appears to be doing excellent in regard to her wound from an infection standpoint I do not see any issues here currently. Unfortunately she continues develop slough over the surface of the wound where using collagen I am not certain that skin to be the best way to go going forward. I really think she may do well with Iodoflex. 10/13/2021 upon evaluation today patient appears to be doing well with regard to her wound. I actually feel like this is showing signs of improvement with regard to the overall appearance I do believe the Iodoflex has been helpful. 10/20/2021 upon evaluation today patient appears to be doing a little better in regard to her ankle ulcer. With that being said she has a new wound on her hand as well due to skin tear that occurred on the way home last week after I saw her. Fortunately there does not appear to be any evidence of active infection at this time. No fevers, chills, nausea, vomiting, or diarrhea. 10/27/2021 upon evaluation today  10/27/2021 upon evaluation today patient appears to be doing better in regard to the wound on the hand as well as the ankle both are showing signs of  being smaller which is great news. I do not see any signs of infection which is also great news. In general I think that we are headed in the appropriate direction. Electronic Signature(s) Signed: 10/27/2021 6:22:45 PM By: Worthy Keeler PA-C Entered By: Worthy Keeler on 10/27/2021 18:22:45 [name] ([id number]) -------------------------------------------------------------------------------- Physical Exam Details Patient Name: [name]. Date of Service: 10/27/2021 2:30 PM Medical Record Number: [id number] Patient Account Number: [id number] Date of Birth/Sex: [birth year] (80 y.o. F) Treating RN: Donnamarie Poag Primary Care Provider: Frazier Richards Other Clinician: Referring Provider: Frazier Richards Treating Provider/Extender: Skipper Cliche in Treatment: 5 Constitutional Well-nourished and well-hydrated in no acute distress. Respiratory normal breathing without difficulty. Psychiatric this patient is able to make decisions and demonstrates good insight into disease process. Alert and Oriented x 3. pleasant and cooperative. Notes Upon inspection patient's wound bed actually showed signs of good granulation and epithelization at this point. Fortunately there does not appear to be any evidence of active infection I did have some slough I was able to remove with saline and gauze as well as a sterile Q-tip over the ankle region and this seems to be doing much better. Electronic Signature(s) Signed: 10/27/2021 6:23:03 PM By: Worthy Keeler PA-C Entered By: Worthy Keeler on 10/27/2021 18:23:03 [name] ([id number]) -------------------------------------------------------------------------------- Physician Orders Details Patient Name: [name]. Date of Service: 10/27/2021 2:30 PM Medical Record Number: [id number] Patient Account Number: [id number] Date of Birth/Sex: 06-17-1941 (80 y.o. F) Treating RN: Donnamarie Poag Primary Care Provider:  Frazier Richards Other Clinician: Referring Provider: Frazier Richards Treating Provider/Extender: Skipper Cliche in Treatment: 5 Verbal / Phone Orders: No Diagnosis Coding ICD-10 Coding Code Description E11.622 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 S61.412A Laceration without foreign body of left hand, initial encounter J44.9 Chronic obstructive pulmonary disease, unspecified I50.42 Chronic combined systolic (congestive) and diastolic (congestive) heart failure Z99.81 Dependence on supplemental oxygen Follow-up Appointments o Return Appointment in 1 week. Bathing/ Shower/ Hygiene o May shower; gently cleanse wound with antibacterial soap, rinse and pat dry prior to dressing wounds Edema Control - Lymphedema / Segmental Compressive Device / Other o Elevate leg(s) parallel to the floor when sitting. o DO YOUR BEST to sleep in the bed at night. DO NOT sleep in your recliner. Long hours of sitting in a recliner leads to swelling of the legs and/or potential wounds on your backside. Additional Orders / Instructions o Follow Nutritious Diet and Increase Protein Intake Oxygen Administration o Pulse Oxymetry measurement in clinic - PRN o While patient is in clinic, provide supplemental oxygen via nasal cannula Wound Treatment Wound #1 - Malleolus Wound Laterality: Right, Lateral Cleanser: Soap and Water 3 x Per Week/30 Days Discharge Instructions: Gently cleanse wound with antibacterial soap, rinse and pat dry prior to dressing wounds Primary Dressing: Xeroform-HBD 2x2 (in/in) 3 x Per Week/30 Days Discharge Instructions: Apply Xeroform-HBD 2x2 (in/in) as directed Secondary Dressing: Non-Adherent Pad 3x8 (in/in) (DME) (Generic) 3 x Per Week/30 Days Secured With: 55M Medipore H Soft Cloth Surgical Tape, 2x2 (in/yd) (DME) (Generic) 3 x Per Week/30 Days Secured With: [employer] Sterile or  Non-Sterile 6-ply 4.5x4 (yd/yd) (DME) (Generic) 3 x Per Week/30 Days Discharge Instructions: Apply Kerlix as directed Wound #2 -  Hand - Dorsum Wound Laterality: Left Cleanser: Soap and Water 3 x Per Week/30 Days Discharge Instructions: Gently cleanse wound with antibacterial soap, rinse and pat dry prior to dressing wounds Primary Dressing: Xeroform-HBD 2x2 (in/in) 3 x Per Week/30 Days Discharge Instructions: Apply Xeroform-HBD 2x2 (in/in) as directed Secondary Dressing: Non-Adherent Pad 3x8 (in/in) (Generic) 3 x Per Week/30 Days Secured With: Conforming Stretch Gauze Bandage 4x75 (in/in) (Generic) 3 x Per Week/30 Days JAYLEANA, COLBERG ([id number]) Discharge Instructions: Apply as directed Secured With: stretch net 3 x Per Week/30 Days Electronic Signature(s) Signed: 10/27/2021 4:20:45 PM By: Donnamarie Poag Signed: 10/27/2021 7:05:42 PM By: Worthy Keeler PA-C Entered By: Donnamarie Poag on 10/27/2021 15:12:27 [name] ([id number]) -------------------------------------------------------------------------------- Problem List Details Patient Name: [name]. Date of Service: 10/27/2021 2:30 PM Medical Record Number: [id number] Patient Account Number: [id number] Date of Birth/Sex: [birth year] (80 y.o. F) Treating RN: Donnamarie Poag Primary Care Provider: Frazier Richards Other Clinician: Referring Provider: Frazier Richards Treating Provider/Extender: Skipper Cliche in Treatment: 5 Active Problems ICD-10 Encounter Code Description Active Date MDM Diagnosis E11.622 Type 2 diabetes mellitus with other skin ulcer 09/21/2021 No Yes L97.312 Non-pressure chronic ulcer of right ankle with fat layer exposed 09/21/2021 No Yes S61.412A Laceration without foreign body of left hand, initial encounter 10/20/2021 No Yes J44.9 Chronic obstructive pulmonary disease, unspecified 09/21/2021 No Yes I50.42 Chronic combined systolic (congestive) and diastolic (congestive) heart 09/21/2021 No  Yes failure Z99.81 Dependence on supplemental oxygen 09/21/2021 No Yes Inactive Problems Resolved Problems Electronic Signature(s) Signed: 10/27/2021 3:05:48 PM By: Worthy Keeler PA-C Entered By: Worthy Keeler on 10/27/2021 15:05:48 [name] ([id number]) -------------------------------------------------------------------------------- Progress Note Details Patient Name: [name]. Date of Service: 10/27/2021 2:30 PM Medical Record Number: [id number] Patient Account Number: [id number] Date of Birth/Sex: 1941-12-25 (80 y.o. F) Treating RN: Donnamarie Poag Primary Care Provider: Frazier Richards Other Clinician: Referring Provider: Frazier Richards Treating Provider/Extender: Skipper Cliche in Treatment: 5 Subjective Chief Complaint Information obtained from Patient Right lateral ankle ulcer and left hand skin tear History of Present Illness (HPI) 09/21/2021 upon evaluation today patient appears to be doing somewhat poorly in regard to her wound on her right lateral ankle. She has been tolerating the dressing changes without complication. Fortunately there does not not appear to be any signs of active infection systemically at this time which is great news. Likely also do not see any major issues which is good news. Nonetheless the biggest problem is that even with attempting to treat this and try to get things under control on her own she is really not been able to get this healed. She does tend to sleep on her right side exclusively due to the fact that she is not able to lay any other way due to pain. She has issues with her right hip. She is a diabetic her last hemoglobin A1c was 5.6 however this should not be affecting her ability to heal she also had an MRI in July which was negative for osteomyelitis. Overall everything checks out okay but nonetheless she is still experiencing the open wound that she just cannot get closed. She does wear oxygen 24/7. She has a  history of congestive heart failure, COPD, and again the diabetes mellitus type 2. 09/29/2021 upon evaluation today patient appears to be doing roughly the same medial movement better as far as some of the necrotic tissue on the surface of the wound is concerned it is kind of gently coming off little by little.  With that being said I do not see any signs of active infection systemically at this point which is good news. 10/06/2021 upon evaluation today patient appears to be doing excellent in regard to her wound from an infection standpoint I do not see any issues here currently. Unfortunately she continues develop slough over the surface of the wound where using collagen I am not certain that skin to be the best way to go going forward. I really think she may do well with Iodoflex. 10/13/2021 upon evaluation today patient appears to be doing well with regard to her wound. I actually feel like this is showing signs of improvement with regard to the overall appearance I do believe the Iodoflex has been helpful. 10/20/2021 upon evaluation today patient appears to be doing a little better in regard to her ankle ulcer. With that being said she has a new wound on her hand as well due to skin tear that occurred on the way home last week after I saw her. Fortunately there does not appear to be any evidence of active infection at this time. No fevers, chills, nausea, vomiting, or diarrhea. 10/27/2021 upon evaluation today 10/27/2021 upon evaluation today patient appears to be doing better in regard to the wound on the hand as well as the ankle both are showing signs of being smaller which is great news. I do not see any signs of infection which is also great news. In general I think that we are headed in the appropriate direction. Objective Constitutional Well-nourished and well-hydrated in no acute distress. Vitals Time Taken: 2:50 PM, Height: 65 in, Weight: 98 lbs, BMI: 16.3, Temperature: 98.4 F, Pulse:  81 bpm, Respiratory Rate: 18 breaths/min, Blood Pressure: 88/48 mmHg. Respiratory normal breathing without difficulty. Psychiatric this patient is able to make decisions and demonstrates good insight into disease process. Alert and Oriented x 3. pleasant and cooperative. General Notes: Upon inspection patient's wound bed actually showed signs of good granulation and epithelization at this point. Fortunately there does not appear to be any evidence of active infection I did have some slough I was able to remove with saline and gauze as well as a sterile Q-tip over the ankle region and this seems to be doing much better. Integumentary (Hair, Skin) Wound #1 status is Open. Original cause of wound was Gradually Appeared. The date acquired was: 05/21/2021. The wound has been in treatment BERNARDA, ERCK. ([id number]) 5 weeks. The wound is located on the Right,Lateral Malleolus. The wound measures 0.3cm length x 0.3cm width x 0.1cm depth; 0.071cm^2 area and 0.007cm^3 volume. There is Fat Layer (Subcutaneous Tissue) exposed. There is no tunneling or undermining noted. There is a medium amount of serosanguineous drainage noted. There is no granulation within the wound bed. There is a large (67-100%) amount of necrotic tissue within the wound bed including Adherent Slough. Wound #2 status is Open. Original cause of wound was Trauma. The date acquired was: 10/13/2021. The wound has been in treatment 1 weeks. The wound is located on the Left Hand - Dorsum. The wound measures 0.7cm length x 1cm width x 0.1cm depth; 0.55cm^2 area and 0.055cm^3 volume. There is Fat Layer (Subcutaneous Tissue) exposed. There is no tunneling or undermining noted. There is a medium amount of serous drainage noted. There is medium (34-66%) pink granulation within the wound bed. There is a medium (34-66%) amount of necrotic tissue within the wound bed including Adherent Slough. 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 Bathing/ Shower/ Hygiene: May shower; gently cleanse wound with antibacterial soap, rinse and pat dry prior to dressing wounds Edema Control - Lymphedema / Segmental Compressive Device / Other: Elevate leg(s) parallel to the floor when sitting. DO YOUR BEST to sleep in the bed at night. DO NOT sleep in your recliner. Long hours of sitting in a recliner leads to swelling of the legs and/or potential wounds on your backside. Additional Orders / Instructions: Follow Nutritious Diet and Increase Protein Intake Oxygen Administration: Pulse Oxymetry measurement in clinic - PRN While patient is in clinic, provide supplemental oxygen via nasal cannula WOUND #1: - Malleolus Wound Laterality: Right, Lateral Cleanser: Soap and Water 3 x Per Week/30 Days Discharge Instructions: Gently cleanse wound with antibacterial soap, rinse and pat dry prior to dressing wounds Primary Dressing: Xeroform-HBD 2x2 (in/in) 3 x Per Week/30 Days Discharge Instructions: Apply Xeroform-HBD 2x2 (in/in) as directed Secondary Dressing: Non-Adherent Pad 3x8 (in/in) (DME) (Generic) 3 x Per Week/30 Days Secured With: 52M Medipore H Soft Cloth Surgical Tape, 2x2 (in/yd) (DME) (Generic) 3 x Per Week/30 Days Secured With: [employer] Sterile or Non-Sterile 6-ply 4.5x4 (yd/yd) (DME) (Generic) 3 x Per Week/30 Days Discharge Instructions: Apply Kerlix as directed WOUND #2: - Hand - Dorsum Wound Laterality: Left Cleanser: Soap and Water 3 x Per Week/30 Days Discharge Instructions: Gently cleanse wound with antibacterial soap, rinse and pat dry prior to  dressing wounds Primary Dressing: Xeroform-HBD 2x2 (in/in) 3 x Per Week/30 Days Discharge Instructions: Apply Xeroform-HBD 2x2 (in/in) as directed Secondary Dressing: Non-Adherent Pad 3x8 (in/in) (Generic) 3 x Per Week/30 Days Secured With: Conforming Stretch Gauze Bandage 4x75 (in/in) (Generic) 3 x Per Week/30 Days Discharge Instructions: Apply as directed Secured With: stretch net 3 x Per Week/30 Days 1. Would recommend currently that we going continue with the Xeroform gauze at both locations I think this is doing a good job. 2. I am also can recommend we have the patient continue to monitor for any signs of worsening right now things seem to be doing quite well. We will see patient back for reevaluation in 1 week here in the clinic. If anything worsens or changes patient will contact our office for additional recommendations. CARLISIA, GENO ([id number]) Electronic Signature(s) Signed: 10/27/2021 6:23:22 PM By: Worthy Keeler PA-C Entered By: Worthy Keeler on 10/27/2021 18:23:22 SHOMARI, MATUSIK ([id number]) -------------------------------------------------------------------------------- SuperBill Details Patient Name: [name]. Date of Service: 10/27/2021 Medical Record Number: [id number] Patient Account Number: [id number] Date of Birth/Sex: [birth year] (80 y.o. F) Treating RN: Donnamarie Poag Primary Care Provider: Frazier Richards Other Clinician: Referring Provider: Frazier Richards Treating Provider/Extender: Skipper Cliche in Treatment: 5 Diagnosis Coding ICD-10 Codes Code Description E11.622 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 S61.412A Laceration without foreign body of left hand, initial encounter J44.9 Chronic obstructive pulmonary disease, unspecified I50.42 Chronic combined systolic (congestive) and diastolic (congestive) heart failure Z99.81 Dependence on supplemental  oxygen Facility Procedures CPT4 Code: [id number] Description: 99213 - WOUND CARE VISIT-LEV 3 EST PT Modifier: Quantity: 1 Physician Procedures CPT4 Code: 3244010 Description: 99213 - WC PHYS LEVEL 3 - EST PT Modifier: Quantity: 1 CPT4 Code: Description: ICD-10 Diagnosis Description E11.622  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 S61.412A Laceration without foreign body of left hand, initial encounter J44.9  Chronic obstructive pulmonary disease, unspecified Modifier: Quantity: Electronic Signature(s) Signed: 10/27/2021 6:23:42 PM By: Worthy Keeler PA-C Previous Signature: 10/27/2021 4:20:45 PM Version By: Donnamarie Poag Entered By: Worthy Keeler on 10/27/2021 18:23:41

## 2021-10-30 NOTE — Progress Notes (Addendum)
[name], [name] ([id number]) Visit Report for 10/27/2021 Arrival Information Details Patient Name: [name], [name]. Date of Service: 10/27/2021 2:30 PM Medical Record Number: [id number] Patient Account Number: [id number] Date of Birth/Sex: Mar 14, 1941 (80 y.o. F) Treating RN: Donnamarie Poag Primary Care Ailish Prospero: Frazier Richards Other Clinician: Referring Khamarion Bjelland: Frazier Richards Treating Delford Wingert/Extender: Skipper Cliche in Treatment: 5 Visit Information History Since Last Visit Added or deleted any medications: No Patient Arrived: Wheel Chair Had a fall or experienced change in No Arrival Time: 14:43 activities of daily living that may affect Accompanied By: grand daughter risk of falls: Transfer Assistance: [employer] Patient Hospitalized since last visit: No Lift Has Dressing in Place as Prescribed: Yes Patient Identification Verified: Yes Pain Present Now: No Secondary Verification Process Completed: Yes Patient Requires Transmission-Based No Precautions: Patient Has Alerts: No Electronic Signature(s) Signed: 10/27/2021 4:20:45 PM By: Donnamarie Poag Previous Signature: 10/27/2021 3:02:59 PM Version By: Donnamarie Poag Entered By: Donnamarie Poag on 10/27/2021 15:08:05 [name] ([id number]) -------------------------------------------------------------------------------- Clinic Level of Care Assessment Details Patient Name: [name]. Date of Service: 10/27/2021 2:30 PM Medical Record Number: [id number] Patient Account Number: [id number] Date of Birth/Sex: January 10, 1941 (80 y.o. F) Treating RN: Donnamarie Poag Primary Care Robie Oats: Frazier Richards Other Clinician: Referring Anthany Thornhill: Frazier Richards Treating Airelle Everding/Extender: Skipper Cliche in Treatment: 5 Clinic Level of Care Assessment Items TOOL 4 Quantity Score _0  - Use when only an EandM is performed on FOLLOW-UP visit 0 ASSESSMENTS - Nursing Assessment / Reassessment _1  - Reassessment of  Co-morbidities (includes updates in patient status) 0 _2  - 0 Reassessment of Adherence to Treatment Plan ASSESSMENTS - Wound and Skin Assessment / Reassessment _3  - Simple Wound Assessment / Reassessment - one wound 0 X- 2 5 Complex Wound Assessment / Reassessment - multiple wounds _4  - 0 Dermatologic / Skin Assessment (not related to wound area) ASSESSMENTS - Focused Assessment _5  - Circumferential Edema Measurements - multi extremities 0 _6  - 0 Nutritional Assessment / Counseling / Intervention _7  - 0 Lower Extremity Assessment (monofilament, tuning fork, pulses) _8  - 0 Peripheral Arterial Disease Assessment (using hand held doppler) ASSESSMENTS - Ostomy and/or Continence Assessment and Care _9  - Incontinence Assessment and Management 0 _10  - 0 Ostomy Care Assessment and Management (repouching, etc.) PROCESS - Coordination of Care X - Simple Patient / Family Education for ongoing care 1 15 _11  - 0 Complex (extensive) Patient / Family Education for ongoing care _12  - 0 Staff obtains Programmer, systems, Records, Test Results / Process Orders _13  - 0 Staff telephones HHA, Nursing Homes / Clarify orders / etc _14  - 0 Routine Transfer to another Facility (non-emergent condition) _15  - 0 Routine Hospital Admission (non-emergent condition) _16  - 0 New Admissions / Biomedical engineer / Ordering NPWT, Apligraf, etc. _17  - 0 Emergency Hospital Admission (emergent condition) X- 1 10 Simple Discharge Coordination _18  - 0 Complex (extensive) Discharge Coordination PROCESS - Special Needs _19  - Pediatric / Minor Patient Management 0 _20  - 0 Isolation Patient Management _21  - 0 Hearing / Language / Visual special needs _22  - 0 Assessment of Community assistance (transportation, D/C planning, etc.) _23  - 0 Additional assistance / Altered mentation _24  - 0 Support Surface(s) Assessment (bed, cushion, seat, etc.) INTERVENTIONS - Wound Cleansing / Measurement [name], [name]. ([id number]) _25  -  0 Simple Wound Cleansing - one wound X- 2 5 Complex Wound Cleansing - multiple wounds X- 1 5 Wound Imaging (photographs - any number of wounds) _26  - 0 Wound Tracing (instead of photographs) _27  - 0 Simple  Wound Measurement - one wound X- 2 5 Complex Wound Measurement - multiple wounds INTERVENTIONS - Wound Dressings _0  - Small Wound Dressing one or multiple wounds 0 X- 2 15 Medium Wound Dressing one or multiple wounds _1  - 0 Large Wound Dressing one or multiple wounds X- 1 5 Application of Medications - topical <[id number]>_8<\/[id number]>_8  - 0 Application of Medications - injection INTERVENTIONS - Miscellaneous _3  - External ear exam 0 _4  - 0 Specimen Collection (cultures, biopsies, blood, body fluids, etc.) _5  - 0 Specimen(s) / Culture(s) sent or taken to Lab for analysis _6  - 0 Patient Transfer (multiple staff / Civil Service fast streamer / Similar devices) _7  - 0 Simple Staple / Suture removal (25 or less) _8  - 0 Complex Staple / Suture removal (26 or more) _9  - 0 Hypo / Hyperglycemic Management (close monitor of Blood Glucose) _10  - 0 Ankle / Brachial Index (ABI) - do not check if billed separately X- 1 5 Vital Signs Has the patient been seen at the hospital within the last three years: Yes Total Score: 100 Level Of Care: New/Established - Level 3 Electronic Signature(s) Signed: 10/27/2021 4:20:45 PM By: Donnamarie Poag Entered By: Donnamarie Poag on 10/27/2021 15:13:00 [name] ([id number]) -------------------------------------------------------------------------------- Encounter Discharge Information Details Patient Name: [name]. Date of Service: 10/27/2021 2:30 PM Medical Record Number: [id number] Patient Account Number: [id number] Date of Birth/Sex: 01-May-1941 (80 y.o. F) Treating RN: Donnamarie Poag Primary Care Keenan Trefry: Frazier Richards Other Clinician: Referring Vannia Pola: Frazier Richards Treating Raliyah Montella/Extender: Skipper Cliche in Treatment: 5 Encounter Discharge  Information Items Discharge Condition: Stable Ambulatory Status: Wheelchair Discharge Destination: Home Transportation: Private Auto Accompanied By: grand daughter Schedule Follow-up Appointment: Yes Clinical Summary of Care: Electronic Signature(s) Signed: 10/27/2021 4:20:45 PM By: Donnamarie Poag Entered By: Donnamarie Poag on 10/27/2021 15:21:44 [name] ([id number]) -------------------------------------------------------------------------------- Lower Extremity Assessment Details Patient Name: [name]. Date of Service: 10/27/2021 2:30 PM Medical Record Number: [id number] Patient Account Number: [id number] Date of Birth/Sex: 1941-10-01 (80 y.o. F) Treating RN: Donnamarie Poag Primary Care Mahasin Riviere: Frazier Richards Other Clinician: Referring Nyrie Sigal: Frazier Richards Treating Keyandre Pileggi/Extender: Jeri Cos Weeks in Treatment: 5 Edema Assessment Assessed: [Left: No] [Right: Yes] Edema: [Left: N] [Right: o] Vascular Assessment Pulses: Dorsalis Pedis Palpable: [Right:Yes] Electronic Signature(s) Signed: 10/27/2021 3:02:59 PM By: Donnamarie Poag Entered By: Donnamarie Poag on 10/27/2021 14:54:26 [name] ([id number]) -------------------------------------------------------------------------------- Multi Wound Chart Details Patient Name: [name]. Date of Service: 10/27/2021 2:30 PM Medical Record Number: [id number] Patient Account Number: [id number] Date of Birth/Sex: Jan 07, 1941 (80 y.o. 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, Other N/A Extremity Comorbid History: Chronic Obstructive Pulmonary Chronic Obstructive Pulmonary N/A Disease (COPD), Congestive Heart Disease (COPD), Congestive Heart Failure, Type II Diabetes Failure, Type II Diabetes Date Acquired: 05/21/2021 10/13/2021 N/A Weeks of Treatment: 5 1 N/A Wound Status: Open Open N/A Measurements L x W x D (cm) 0.3x0.3x0.1 0.7x1x0.1 N/A Area (cm) : 0.071 0.55 N/A Volume (cm) : 0.007 0.055 N/A % Reduction in Area: 54.80% 84.40% N/A % Reduction in Volume: 56.30% 84.40% N/A Classification: Grade 1 Full Thickness Without Exposed N/A Support Structures Exudate Amount: Medium Medium N/A  Exudate Type: Serosanguineous Serous N/A Exudate Color: red, brown amber N/A Granulation Amount: None Present (0%) Medium (34-66%) N/A Granulation Quality: N/A Pink N/A Necrotic Amount: Large (67-100%) Medium (34-66%) N/A Exposed Structures: Fat Layer (Subcutaneous Tissue): Fat Layer (Subcutaneous Tissue): N/A Yes Yes Fascia: No Fascia: No Tendon: No Tendon: No Muscle: No Muscle: No Joint: No Joint: No Bone: No Bone: No Epithelialization: None Medium (34-66%) N/A Treatment Notes Electronic Signature(s) Signed: 10/27/2021 4:20:45 PM By: Donnamarie Poag Entered By: Donnamarie Poag on 10/27/2021 15:07:18 [name] ([id number]) -------------------------------------------------------------------------------- [place] Details Patient Name: [name]. Date of Service: 10/27/2021 2:30 PM Medical Record Number: [id number] Patient Account Number: [id number] Date of Birth/Sex: [birth year] (80 y.o. F) Treating RN: Donnamarie Poag Primary Care Jerrika Ledlow: Frazier Richards Other Clinician: Referring Han Lysne: Frazier Richards Treating Jamonica Schoff/Extender: Skipper Cliche in Treatment: 5 Active Inactive Necrotic Tissue Nursing Diagnoses: Impaired tissue integrity  related to necrotic/devitalized tissue Goals: Necrotic/devitalized tissue will be minimized in the wound bed Date Initiated: 09/21/2021 Target Resolution Date: 09/21/2021 Goal Status: Active Patient/caregiver will verbalize understanding of reason and process for debridement of necrotic tissue Date Initiated: 09/21/2021 Date Inactivated: 10/27/2021 Target Resolution Date: 09/21/2021 Goal Status: Met Interventions: Assess patient pain level pre-, during and post procedure and prior to discharge Provide education on necrotic tissue and debridement process Treatment Activities: Apply topical anesthetic as ordered : 09/21/2021 Biologic debridement : 09/21/2021 Enzymatic debridement : 09/21/2021 Excisional debridement : 09/21/2021 Notes: Wound/Skin Impairment Nursing Diagnoses: Impaired tissue integrity Goals: Patient/caregiver will verbalize understanding of skin care regimen Date Initiated: 09/21/2021 Date Inactivated: 10/27/2021 Target Resolution Date: 09/21/2021 Goal Status: Met Ulcer/skin breakdown will have a volume reduction of 30% by week 4 Date Initiated: 09/21/2021 Date Inactivated: 10/27/2021 Target Resolution Date: 10/21/2021 Goal Status: Met Ulcer/skin breakdown will have a volume reduction of 50% by week 8 Date Initiated: 09/21/2021 Target Resolution Date: 11/21/2021 Goal Status: Active Ulcer/skin breakdown will have a volume reduction of 80% by week 12 Date Initiated: 09/21/2021 Target Resolution Date: 12/21/2021 Goal Status: Active Ulcer/skin breakdown will heal within 14 weeks Date Initiated: 09/21/2021 Target Resolution Date: 01/21/2022 Goal Status: Active Interventions: Assess patient/caregiver ability to obtain necessary supplies Assess patient/caregiver ability to perform ulcer/skin care regimen upon admission and as needed Assess ulceration(s) every visit Provide education on ulcer and skin care [name], [name] ([id number]) Treatment Activities: Referred to  DME Ramy Greth for dressing supplies : 09/21/2021 Skin care regimen initiated : 09/21/2021 Notes: Electronic Signature(s) Signed: 10/27/2021 3:02:59 PM By: Donnamarie Poag Entered By: Donnamarie Poag on 10/27/2021 14:54:45 [name] ([id number]) -------------------------------------------------------------------------------- Pain Assessment Details Patient Name: [name]. Date of Service: 10/27/2021 2:30 PM Medical Record Number: [id number] Patient Account Number: [id number] Date of Birth/Sex: 1941/02/26 (80 y.o. F) Treating RN: Donnamarie Poag Primary Care Tegh Franek: Frazier Richards Other Clinician: Referring Brinton Brandel: Frazier Richards Treating Logen Fowle/Extender: Skipper Cliche in Treatment: 5 Active Problems Location of Pain Severity and Description of Pain Patient Has Paino No Site Locations Rate the pain. Current Pain Level: 0 Pain Management and Medication Current Pain Management: Electronic Signature(s) Signed: 10/27/2021 3:02:59 PM By: Donnamarie Poag Entered By: Donnamarie Poag on 10/27/2021 14:51:45 [name] ([id number]) -------------------------------------------------------------------------------- Patient/Caregiver Education Details Patient Name: [name]. Date of Service: 10/27/2021 2:30 PM Medical Record Number: [id number] Patient Account Number: [id number] Date of Birth/Gender: 12-Feb-1941 (80 y.o. F) Treating RN: Donnamarie Poag Primary Care Physician: Frazier Richards Other Clinician: Referring Physician: Frazier Richards Treating Physician/Extender: Skipper Cliche in Treatment: 5 Education Assessment Education Provided To:  Patient and Caregiver Education Topics Provided Basic Hygiene: Wound Debridement: Wound/Skin Impairment: Electronic Signature(s) Signed: 10/27/2021 4:20:45 PM By: Donnamarie Poag Entered By: Donnamarie Poag on 10/27/2021 15:21:07 [name]  ([id number]) -------------------------------------------------------------------------------- Wound Assessment Details Patient Name: [name]. Date of Service: 10/27/2021 2:30 PM Medical Record Number: [id number] Patient Account Number: [id number] Date of Birth/Sex: 11/06/1941 (80 y.o. F) Treating RN: Donnamarie Poag Primary Care Larhonda Dettloff: Frazier Richards Other Clinician: Referring Jaysun Wessels: Frazier Richards Treating Harlyn Italiano/Extender: Skipper Cliche in Treatment: 5 Wound Status Wound Number: 1 Primary Diabetic Wound/Ulcer of the Lower Extremity Etiology: Wound Location: Right, Lateral Malleolus Wound Open Wounding Event: Gradually Appeared Status: Date Acquired: 05/21/2021 Comorbid Chronic Obstructive Pulmonary Disease (COPD), Weeks Of Treatment: 5 History: Congestive Heart Failure, Type II Diabetes Clustered Wound: No Photos Wound Measurements Length: (cm) 0.3 Width: (cm) 0.3 Depth: (cm) 0.1 Area: (cm) 0.071 Volume: (cm) 0.007 % Reduction in Area: 54.8% % Reduction in Volume: 56.3% Epithelialization: None Tunneling: No Undermining: No Wound Description Classification: Grade 1 Exudate Amount: Medium Exudate Type: Serosanguineous Exudate Color: red, brown Foul Odor After Cleansing: No Slough/Fibrino Yes Wound Bed Granulation Amount: None Present (0%) Exposed Structure Necrotic Amount: Large (67-100%) Fascia Exposed: No Necrotic Quality: Adherent Slough Fat Layer (Subcutaneous Tissue) Exposed: Yes Tendon Exposed: No Muscle Exposed: No Joint Exposed: No Bone Exposed: No Treatment Notes Wound #1 (Malleolus) Wound Laterality: Right, Lateral Cleanser Soap and Water Discharge Instruction: Gently cleanse wound with antibacterial soap, rinse and pat dry prior to dressing wounds Peri-Wound Care [name], [name] ([id number]) Topical Primary Dressing Xeroform-HBD 2x2 (in/in) Discharge Instruction: Apply Xeroform-HBD 2x2 (in/in) as directed Secondary  Dressing Non-Adherent Pad 3x8 (in/in) Secured With 39M Medipore H Soft Cloth Surgical Tape, 2x2 (in/yd) Kerlix Roll Sterile or Non-Sterile 6-ply 4.5x4 (yd/yd) Discharge Instruction: Apply Kerlix as directed Compression Wrap Compression Stockings Add-Ons Electronic Signature(s) Signed: 10/27/2021 3:02:59 PM By: Donnamarie Poag Entered By: Donnamarie Poag on 10/27/2021 14:53:14 [name] ([id number]) -------------------------------------------------------------------------------- Wound Assessment Details Patient Name: [name]. Date of Service: 10/27/2021 2:30 PM Medical Record Number: [id number] Patient Account Number: [id number] Date of Birth/Sex: 03-05-1941 (80 y.o. F) Treating RN: Donnamarie Poag Primary Care Reeda Soohoo: Frazier Richards Other Clinician: Referring Dasani Crear: Frazier Richards Treating Breely Panik/Extender: Skipper Cliche in Treatment: 5 Wound Status Wound Number: 2 Primary Trauma, Other Etiology: Wound Location: Left Hand - Dorsum Wound Open Wounding Event: Trauma Status: Date Acquired: 10/13/2021 Comorbid Chronic Obstructive Pulmonary Disease (COPD), Weeks Of Treatment: 1 History: Congestive Heart Failure, Type II Diabetes Clustered Wound: No Photos Wound Measurements Length: (cm) 0.7 Width: (cm) 1 Depth: (cm) 0.1 Area: (cm) 0.55 Volume: (cm) 0.055 % Reduction in Area: 84.4% % Reduction in Volume: 84.4% Epithelialization: Medium (34-66%) Tunneling: No Undermining: No Wound Description Classification: Full Thickness Without Exposed Support Structu Exudate Amount: Medium Exudate Type: Serous Exudate Color: amber res Foul Odor After Cleansing: No Slough/Fibrino No Wound Bed Granulation Amount: Medium (34-66%) Exposed Structure Granulation Quality: Pink Fascia Exposed: No Necrotic Amount: Medium (34-66%) Fat Layer (Subcutaneous Tissue) Exposed: Yes Necrotic Quality: Adherent Slough Tendon Exposed: No Muscle Exposed: No Joint Exposed:  No Bone Exposed: No Treatment Notes Wound #2 (Hand - Dorsum) Wound Laterality: Left Cleanser Soap and Water Discharge Instruction: Gently cleanse wound with antibacterial soap, rinse and pat dry prior to dressing wounds Peri-Wound Care [name], [name] ([id number]) Topical Primary Dressing Xeroform-HBD 2x2 (in/in) Discharge Instruction: Apply Xeroform-HBD 2x2 (in/in) as directed Secondary Dressing Non-Adherent Pad 3x8 (in/in) Secured With Conforming Stretch Gauze Bandage 4x75 (in/in) Discharge Instruction: Apply  as directed stretch net Compression Wrap Compression Stockings Add-Ons Electronic Signature(s) Signed: 10/27/2021 3:02:59 PM By: Donnamarie Poag Entered By: Donnamarie Poag on 10/27/2021 14:54:12 [name] ([id number]) -------------------------------------------------------------------------------- [place] Details Patient Name: [name]. Date of Service: 10/27/2021 2:30 PM Medical Record Number: [id number] Patient Account Number: [id number] Date of Birth/Sex: [birth year] (80 y.o. F) Treating RN: Donnamarie Poag Primary Care Kanon Novosel: Frazier Richards Other Clinician: Referring Evola Hollis: Frazier Richards Treating Debar Plate/Extender: Skipper Cliche in Treatment: 5 Vital Signs Time Taken: 14:50 Temperature (F): 98.4 Height (in): 65 Pulse (bpm): 81 Weight (lbs): 98 Respiratory Rate (breaths/min): 18 Body Mass Index (BMI): 16.3 Blood Pressure (mmHg): 88/48 Reference Range: 80 - 120 mg / dl Electronic Signature(s) Signed: 10/27/2021 3:02:59 PM By: Donnamarie Poag Entered ByDonnamarie Poag on 10/27/2021 14:51:38

## 2021-10-31 DIAGNOSIS — E11622 Type 2 diabetes mellitus with other skin ulcer: Secondary | ICD-10-CM | POA: Diagnosis not present

## 2021-11-03 ENCOUNTER — Other Ambulatory Visit: Payer: Self-pay

## 2021-11-03 ENCOUNTER — Encounter: Payer: PPO | Attending: Physician Assistant | Admitting: Physician Assistant

## 2021-11-03 DIAGNOSIS — X58XXXA Exposure to other specified factors, initial encounter: Secondary | ICD-10-CM | POA: Insufficient documentation

## 2021-11-03 DIAGNOSIS — E11622 Type 2 diabetes mellitus with other skin ulcer: Secondary | ICD-10-CM | POA: Diagnosis not present

## 2021-11-03 DIAGNOSIS — Z9981 Dependence on supplemental oxygen: Secondary | ICD-10-CM | POA: Insufficient documentation

## 2021-11-03 DIAGNOSIS — S61412A Laceration without foreign body of left hand, initial encounter: Secondary | ICD-10-CM | POA: Insufficient documentation

## 2021-11-03 DIAGNOSIS — J449 Chronic obstructive pulmonary disease, unspecified: Secondary | ICD-10-CM | POA: Diagnosis not present

## 2021-11-03 DIAGNOSIS — L97312 Non-pressure chronic ulcer of right ankle with fat layer exposed: Secondary | ICD-10-CM | POA: Diagnosis not present

## 2021-11-03 DIAGNOSIS — L98492 Non-pressure chronic ulcer of skin of other sites with fat layer exposed: Secondary | ICD-10-CM | POA: Diagnosis not present

## 2021-11-03 DIAGNOSIS — I5042 Chronic combined systolic (congestive) and diastolic (congestive) heart failure: Secondary | ICD-10-CM | POA: Diagnosis not present

## 2021-11-03 NOTE — Progress Notes (Addendum)
Jasmine, Montoya ([id number]) Visit Report for 11/03/2021 Chief Complaint Document Details Patient Name: [name], [name]. Date of Service: 11/03/2021 1:00 PM Medical Record Number: [id number] Patient Account Number: [id number] Date of Birth/Sex: 1941-07-28 (80 y.o. F) Treating RN: Donnamarie Poag Primary Care Provider: Frazier Richards Other Clinician: Referring Provider: Frazier Richards Treating Provider/Extender: Skipper Cliche in Treatment: 6 Information Obtained from: Patient Chief Complaint Right lateral ankle ulcer and left hand skin tear Electronic Signature(s) Signed: 11/03/2021 1:08:32 PM By: Worthy Keeler PA-C Entered By: Worthy Keeler on 11/03/2021 13:08:32 [name] ([id number]) -------------------------------------------------------------------------------- HPI Details Patient Name: [name]. Date of Service: 11/03/2021 1:00 PM Medical Record Number: [id number] Patient Account Number: [id number] Date of Birth/Sex: 1941-10-07 (80 y.o. F) Treating RN: Donnamarie Poag Primary Care Provider: Frazier Richards Other Clinician: Referring Provider: Frazier Richards Treating Provider/Extender: Skipper Cliche in Treatment: 6 History of Present Illness HPI Description: 09/21/2021 upon evaluation today patient appears to be doing somewhat poorly in regard to her wound on her right lateral ankle. She has been tolerating the dressing changes without complication. Fortunately there does not not appear to be any signs of active infection systemically at this time which is great news. Likely also do not see any major issues which is good news. Nonetheless the biggest problem is that even with attempting to treat this and try to get things under control on her own she is really not been able to get this healed. She does tend to sleep on her right side exclusively due to the fact that she is not able to lay any other way due to pain. She has issues with her  right hip. She is a diabetic her last hemoglobin A1c was 5.6 however this should not be affecting her ability to heal she also had an MRI in July which was negative for osteomyelitis. Overall everything checks out okay but nonetheless she is still experiencing the open wound that she just cannot get closed. She does wear oxygen 24/7. She has a history of congestive heart failure, COPD, and again the diabetes mellitus type 2. 09/29/2021 upon evaluation today patient appears to be doing roughly the same medial movement better as far as some of the necrotic tissue on the surface of the wound is concerned it is kind of gently coming off little by little. With that being said I do not see any signs of active infection systemically at this point which is good news. 10/06/2021 upon evaluation today patient appears to be doing excellent in regard to her wound from an infection standpoint I do not see any issues here currently. Unfortunately she continues develop slough over the surface of the wound where using collagen I am not certain that skin to be the best way to go going forward. I really think she may do well with Iodoflex. 10/13/2021 upon evaluation today patient appears to be doing well with regard to her wound. I actually feel like this is showing signs of improvement with regard to the overall appearance I do believe the Iodoflex has been helpful. 10/20/2021 upon evaluation today patient appears to be doing a little better in regard to her ankle ulcer. With that being said she has a new wound on her hand as well due to skin tear that occurred on the way home last week after I saw her. Fortunately there does not appear to be any evidence of active infection at this time. No fevers, chills, nausea, vomiting, or diarrhea. 10/27/2021 upon evaluation today  10/27/2021 upon evaluation today patient appears to be doing better in regard to the wound on the hand as well as the ankle both are showing signs of  being smaller which is great news. I do not see any signs of infection which is also great news. In general I think that we are headed in the appropriate direction. 11/03/21 upon evaluation today patient appears to be doing well with regard to her wounds. She has been tolerating the dressing changes without complication. Fortunately there does not appear to be any signs of active infection at this time which is great news. No fevers, chills, nausea, vomiting, or diarrhea. Electronic Signature(s) Signed: 11/03/2021 2:07:42 PM By: Worthy Keeler PA-C Entered By: Worthy Keeler on 11/03/2021 14:07:42 [name] ([id number]) -------------------------------------------------------------------------------- [name] TISS Details Patient Name: [name]. Date of Service: 11/03/2021 1:00 PM Medical Record Number: [id number] Patient Account Number: [id number] Date of Birth/Sex: [birth year] (80 y.o. F) Treating RN: Donnamarie Poag Primary Care Provider: Frazier Richards Other Clinician: Referring Provider: Frazier Richards Treating Provider/Extender: Skipper Cliche in Treatment: 6 Procedure Performed for: Wound #2 Left Hand - Dorsum Performed By: Physician Tommie Sams., PA-C Post Procedure Diagnosis Same as Pre-procedure Notes one stick silver nitrate used and tolerated well Electronic Signature(s) Signed: 11/03/2021 3:44:08 PM By: Donnamarie Poag Entered By: Donnamarie Poag on 11/03/2021 13:30:29 [name] ([id number]) -------------------------------------------------------------------------------- Physical Exam Details Patient Name: [name]. Date of Service: 11/03/2021 1:00 PM Medical Record Number: [id number] Patient Account Number: [id number] Date of Birth/Sex: 02/04/1941 (80 y.o. F) Treating RN: Donnamarie Poag Primary Care Provider: Frazier Richards Other Clinician: Referring Provider: Frazier Richards Treating Provider/Extender: Skipper Cliche  in Treatment: 6 Constitutional Well-nourished and well-hydrated in no acute distress. Respiratory normal breathing without difficulty. Psychiatric this patient is able to make decisions and demonstrates good insight into disease process. Alert and Oriented x 3. pleasant and cooperative. Notes Upon inspection patient's wound bed actually showed signs of good granulation and epithelization at this point. Fortunately there does not appear to be any signs of active infection systemically which is also good news. No fevers, chills, nausea, vomiting, or diarrhea. She does have some hypergranulation on the hand wound I am getting perform chemical cauterization with silver nitrate. Electronic Signature(s) Signed: 11/03/2021 2:08:05 PM By: Worthy Keeler PA-C Entered By: Worthy Keeler on 11/03/2021 14:08:05 [name] ([id number]) -------------------------------------------------------------------------------- Physician Orders Details Patient Name: [name]. Date of Service: 11/03/2021 1:00 PM Medical Record Number: [id number] Patient Account Number: [id number] Date of Birth/Sex: 1941-04-25 (80 y.o. F) Treating RN: Donnamarie Poag Primary Care Provider: Frazier Richards Other Clinician: Referring Provider: Frazier Richards Treating Provider/Extender: Skipper Cliche in Treatment: 6 Verbal / Phone Orders: No Diagnosis Coding ICD-10 Coding Code Description E11.622 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 S61.412A Laceration without foreign body of left hand, initial encounter J44.9 Chronic obstructive pulmonary disease, unspecified I50.42 Chronic combined systolic (congestive) and diastolic (congestive) heart failure Z99.81 Dependence on supplemental oxygen Follow-up Appointments o Return Appointment in 1 week. o Nurse Visit as needed Bathing/ Shower/ Hygiene o May shower; gently cleanse  wound with antibacterial soap, rinse and pat dry prior to dressing wounds Edema Control - Lymphedema / Segmental Compressive Device / Other o Elevate leg(s) parallel to the floor when sitting. o DO YOUR BEST to sleep in the bed at night. DO NOT sleep in your recliner. Long hours of  sitting in a recliner leads to swelling of the legs and/or potential wounds on your backside. Additional Orders / Instructions o Follow Nutritious Diet and Increase Protein Intake Oxygen Administration o Pulse Oxymetry measurement in clinic - PRN o While patient is in clinic, provide supplemental oxygen via nasal cannula Wound Treatment Wound #1 - Malleolus Wound Laterality: Right, Lateral Cleanser: Soap and Water 3 x Per Week/30 Days Discharge Instructions: Gently cleanse wound with antibacterial soap, rinse and pat dry prior to dressing wounds Primary Dressing: Hydrofera Blue Ready Transfer Foam, 2.5x2.5 (in/in) 3 x Per Week/30 Days Discharge Instructions: CUt to fit wound-Apply Hydrofera Blue Ready to wound bed as directed Secondary Dressing: Gauze 3 x Per Week/30 Days Discharge Instructions: to cover blue Secured With: 108M Medipore H Soft Cloth Surgical Tape, 2x2 (in/yd) (Generic) 3 x Per Week/30 Days Secured With: [employer] Sterile or Non-Sterile 6-ply 4.5x4 (yd/yd) (Generic) 3 x Per Week/30 Days Discharge Instructions: Apply Kerlix as directed to secure Wound #2 - Hand - Dorsum Wound Laterality: Left Cleanser: Soap and Water 3 x Per Week/30 Days Discharge Instructions: Gently cleanse wound with antibacterial soap, rinse and pat dry prior to dressing wounds Primary Dressing: Hydrofera Blue Ready Transfer Foam, 2.5x2.5 (in/in) 3 x Per Week/30 Days Discharge Instructions: 'cut to fit wound-Apply Hydrofera Blue Ready to wound bed as directed Secondary Dressing: Gauze 3 x Per Week/30 Days JONAY, HITCHCOCK ([id number]) Discharge Instructions: to cover blue Secured With: 108M Medipore H Soft Cloth  Surgical Tape, 2x2 (in/yd) 3 x Per Week/30 Days Secured With: Conforming Stretch Gauze Bandage 4x75 (in/in) (Generic) 3 x Per Week/30 Days Discharge Instructions: Apply as directed Electronic Signature(s) Signed: 11/03/2021 3:44:08 PM By: Donnamarie Poag Signed: 11/03/2021 4:56:27 PM By: Worthy Keeler PA-C Entered By: Donnamarie Poag on 11/03/2021 13:34:41 [name] ([id number]) -------------------------------------------------------------------------------- Problem List Details Patient Name: [name]. Date of Service: 11/03/2021 1:00 PM Medical Record Number: [id number] Patient Account Number: [id number] Date of Birth/Sex: March 26, 1941 (80 y.o. 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 Date of Service: 11/03/2021 1:00 PM Medical Record Number: [id number] Patient Account Number: [id number] Date of Birth/Sex: November 29, 1941 (80 y.o. F) Treating RN:  Donnamarie Poag Primary Care Provider: Frazier Richards Other Clinician: Referring Provider: Frazier Richards Treating Provider/Extender: Skipper Cliche in Treatment: 6 Subjective Chief Complaint Information obtained from Patient Right lateral ankle ulcer and left hand skin tear History of Present Illness (HPI) 09/21/2021 upon evaluation today patient appears to be doing somewhat poorly in regard to her wound on her right lateral ankle. She has been tolerating the dressing changes without complication. Fortunately there does not not appear to be any signs of active infection systemically at this time which is great news. Likely also do not  see any major issues which is good news. Nonetheless the biggest problem is that even with attempting to treat this and try to get things under control on her own she is really not been able to get this healed. She does tend to sleep on her right side exclusively due to the fact that she is not able to lay any other way due to pain. She has issues with her right hip. She is a diabetic her last hemoglobin A1c was 5.6 however this should not be affecting her ability to heal she also had an MRI in July which was negative for osteomyelitis. Overall everything checks out okay but nonetheless she is still experiencing the open wound that she just cannot get closed. She does wear oxygen 24/7. She has a history of congestive heart failure, COPD, and again the diabetes mellitus type 2. 09/29/2021 upon evaluation today patient appears to be doing roughly the same medial movement better as far as some of the necrotic tissue on the surface of the wound is concerned it is kind of gently coming off little by little. With that being said I do not see any signs of active infection systemically at this point which is good news. 10/06/2021 upon evaluation today patient appears to be doing excellent in regard to her wound from an infection standpoint I do not see any issues  here currently. Unfortunately she continues develop slough over the surface of the wound where using collagen I am not certain that skin to be the best way to go going forward. I really think she may do well with Iodoflex. 10/13/2021 upon evaluation today patient appears to be doing well with regard to her wound. I actually feel like this is showing signs of improvement with regard to the overall appearance I do believe the Iodoflex has been helpful. 10/20/2021 upon evaluation today patient appears to be doing a little better in regard to her ankle ulcer. With that being said she has a new wound on her hand as well due to skin tear that occurred on the way home last week after I saw her. Fortunately there does not appear to be any evidence of active infection at this time. No fevers, chills, nausea, vomiting, or diarrhea. 10/27/2021 upon evaluation today 10/27/2021 upon evaluation today patient appears to be doing better in regard to the wound on the hand as well as the ankle both are showing signs of being smaller which is great news. I do not see any signs of infection which is also great news. In general I think that we are headed in the appropriate direction. 11/03/21 upon evaluation today patient appears to be doing well with regard to her wounds. She has been tolerating the dressing changes without complication. Fortunately there does not appear to be any signs of active infection at this time which is great news. No fevers, chills, nausea, vomiting, or diarrhea. Objective Constitutional Well-nourished and well-hydrated in no acute distress. Vitals Time Taken: 3:02 PM, Height: 65 in, Weight: 98 lbs, BMI: 16.3, Temperature: 98.2 F, Pulse: 70 bpm, Respiratory Rate: 16 breaths/min, Blood Pressure: 97/45 mmHg. Respiratory normal breathing without difficulty. Psychiatric this patient is able to make decisions and demonstrates good insight into disease process. Alert and Oriented x 3. pleasant  and cooperative. General Notes: Upon inspection patient's wound bed actually showed signs of good granulation and epithelization at this point. Fortunately there does not appear to be any signs of active infection systemically which is also good news. No fevers,  chills, nausea, vomiting, or diarrhea. She does CEANA, FIALA. ([id number]) have some hypergranulation on the hand wound I am getting perform chemical cauterization with silver nitrate. Integumentary (Hair, Skin) Wound #1 status is Open. Original cause of wound was Gradually Appeared. The date acquired was: 05/21/2021. The wound has been in treatment 6 weeks. The wound is located on the Right,Lateral Malleolus. The wound measures 0.3cm length x 0.2cm width x 0.1cm depth; 0.047cm^2 area and 0.005cm^3 volume. There is Fat Layer (Subcutaneous Tissue) exposed. There is no tunneling or undermining noted. There is a medium amount of serosanguineous drainage noted. There is no granulation within the wound bed. There is a large (67-100%) amount of necrotic tissue within the wound bed including Adherent Slough. Wound #2 status is Open. Original cause of wound was Trauma. The date acquired was: 10/13/2021. The wound has been in treatment 2 weeks. The wound is located on the Left Hand - Dorsum. The wound measures 0.5cm length x 0.8cm width x 0.1cm depth; 0.314cm^2 area and 0.031cm^3 volume. There is Fat Layer (Subcutaneous Tissue) exposed. There is no tunneling or undermining noted. There is a medium amount of serous drainage noted. There is large (67-100%) red, pink granulation within the wound bed. There is no necrotic tissue within the wound bed. Assessment Active Problems ICD-10 Type 2 diabetes mellitus with other skin ulcer Non-pressure chronic ulcer of right ankle with fat layer exposed Laceration without foreign body of left hand, initial encounter Chronic obstructive pulmonary disease, unspecified Chronic combined systolic (congestive)  and diastolic (congestive) heart failure Dependence on supplemental oxygen Procedures Wound #2 Pre-procedure diagnosis of Wound #2 is a Trauma, Other located on the Left Hand - Dorsum . An CHEM CAUT GRANULATION TISS procedure was performed by Tommie Sams., PA-C. Post procedure Diagnosis Wound #2: Same as Pre-Procedure Notes: one stick silver nitrate used and tolerated well Plan Follow-up Appointments: Return Appointment in 1 week. Nurse Visit as needed Bathing/ Shower/ Hygiene: May shower; gently cleanse wound with antibacterial soap, rinse and pat dry prior to dressing wounds Edema Control - Lymphedema / Segmental Compressive Device / Other: Elevate leg(s) parallel to the floor when sitting. DO YOUR BEST to sleep in the bed at night. DO NOT sleep in your recliner. Long hours of sitting in a recliner leads to swelling of the legs and/or potential wounds on your backside. Additional Orders / Instructions: Follow Nutritious Diet and Increase Protein Intake Oxygen Administration: Pulse Oxymetry measurement in clinic - PRN While patient is in clinic, provide supplemental oxygen via nasal cannula WOUND #1: - Malleolus Wound Laterality: Right, Lateral Cleanser: Soap and Water 3 x Per Week/30 Days Discharge Instructions: Gently cleanse wound with antibacterial soap, rinse and pat dry prior to dressing wounds Primary Dressing: Hydrofera Blue Ready Transfer Foam, 2.5x2.5 (in/in) 3 x Per Week/30 Days Discharge Instructions: CUt to fit wound-Apply Hydrofera Blue Ready to wound bed as directed Secondary Dressing: Gauze 3 x Per Week/30 Days Discharge Instructions: to cover blue Secured With: 11M Medipore H Soft Cloth Surgical Tape, 2x2 (in/yd) (Generic) 3 x Per Week/30 Days Secured With: [employer] Sterile or Non-Sterile 6-ply 4.5x4 (yd/yd) (Generic) 3 x Per Week/30 Days Discharge Instructions: Apply Kerlix as directed to secure WOUND #2: - Hand - Dorsum Wound Laterality: Left Cleanser: Soap  and Water 3 x Per Week/30 Days Discharge Instructions: Gently cleanse wound with antibacterial soap, rinse and pat dry prior to dressing wounds LEIGHLA, CHESTNUTT ([id number]) Primary Dressing: Hydrofera Blue Ready Transfer Foam, 2.5x2.5 (in/in) 3 x Per  Week/30 Days Discharge Instructions: 'cut to fit wound-Apply Hydrofera Blue Ready to wound bed as directed Secondary Dressing: Gauze 3 x Per Week/30 Days Discharge Instructions: to cover blue Secured With: 43M Medipore H Soft Cloth Surgical Tape, 2x2 (in/yd) 3 x Per Week/30 Days Secured With: Conforming Stretch Gauze Bandage 4x75 (in/in) (Generic) 3 x Per Week/30 Days Discharge Instructions: Apply as directed 1. Would recommend currently that we going continue with wound care measures as before the patient is in agreement with plan this includes the use of the dressing changes every other day and would recommend however we switch to a Hydrofera Blue dressing. I think this may do better. Especially for the hand but I think the ankle 2. 2. I am also can recommend that we have the patient continue with roll gauze to secure in place in regard to the ankle region. Were also using distraction at 2 secure in place in regard to the hand. We will see patient back for reevaluation in 1 week here in the clinic. If anything worsens or changes patient will contact our office for additional recommendations. Electronic Signature(s) Signed: 11/03/2021 2:08:41 PM By: Worthy Keeler PA-C Entered By: Worthy Keeler on 11/03/2021 14:08:41 [name] ([id number]) -------------------------------------------------------------------------------- SuperBill Details Patient Name: [name]. Date of Service: 11/03/2021 Medical Record Number: [id number] Patient Account Number: [id number] Date of Birth/Sex: 15-Feb-1941 (80 y.o. F) Treating RN: Donnamarie Poag Primary Care Provider: Frazier Richards Other Clinician: Referring Provider: Frazier Richards Treating Provider/Extender: Skipper Cliche in Treatment: 6 Diagnosis Coding ICD-10 Codes Code Description E11.622 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 S61.412A Laceration without foreign body of left hand, initial encounter J44.9 Chronic obstructive pulmonary disease, unspecified I50.42 Chronic combined systolic (congestive) and diastolic (congestive) heart failure Z99.81 Dependence on supplemental oxygen Facility Procedures CPT4 Code: [id number] Description: [place] TISS Modifier: Quantity: 1 CPT4 Code: Description: ICD-10 Diagnosis Description S61.412A Laceration without foreign body of left hand, initial encounter Modifier: Quantity: Physician Procedures CPT4 Code: [id number] Description: 99214 - WC PHYS LEVEL 4 - EST PT Modifier: 25 Quantity: 1 CPT4 Code: Description: ICD-10 Diagnosis Description E11.622 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 S61.412A Laceration without foreign body of left hand, initial encounter J44.9  Chronic obstructive pulmonary disease, unspecified Modifier: Quantity: CPT4 Code: [id number] Description: 38756 - WC PHYS CHEM CAUT GRAN TISSUE Modifier: Quantity: 1 CPT4 Code: Description: ICD-10 Diagnosis Description S61.412A Laceration without foreign body of left hand, initial encounter Modifier: Quantity: Electronic Signature(s) Signed: 11/03/2021 2:09:15 PM By: Worthy Keeler PA-C Entered By: Worthy Keeler on 11/03/2021 14:09:14

## 2021-11-03 NOTE — Progress Notes (Signed)
KEMBA, HOPPES ([id number]) Visit Report for 11/03/2021 Arrival Information Details Patient Name: [name], [name]. Date of Service: 11/03/2021 1:00 PM Medical Record Number: [id number] Patient Account Number: [id number] Date of Birth/Sex: January 13, 1941 (80 y.o. F) Treating RN: Donnamarie Poag Primary Care Glenn Christo: Frazier Richards Other Clinician: Referring Soundra Lampley: Frazier Richards Treating Veralyn Lopp/Extender: Skipper Cliche in Treatment: 6 Visit Information History Since Last Visit Added or deleted any medications: No Patient Arrived: Wheel Chair Had a fall or experienced change in No Arrival Time: 13:00 activities of daily living that may affect Accompanied By: daughter risk of falls: Transfer Assistance: EasyP[employer] Patient Hospitalized since last visit: No Lift Has Dressing in Place as Prescribed: Yes Patient Requires Transmission-Based No Pain Present Now: Yes Precautions: Patient Has Alerts: No Electronic Signature(s) Signed: 11/03/2021 3:44:08 PM By: Donnamarie Poag Entered By: Donnamarie Poag on 11/03/2021 13:02:53 [name] ([id number]) -------------------------------------------------------------------------------- Clinic Level of Care Assessment Details Patient Name: [name]. Date of Service: 11/03/2021 1:00 PM Medical Record Number: [id number] Patient Account Number: [id number] Date of Birth/Sex: [birth year] (80 y.o. F) Treating RN: Donnamarie Poag Primary Care Jo Booze: Frazier Richards Other Clinician: Referring Gianny Killman: Frazier Richards Treating Dvid Pendry/Extender: Skipper Cliche in Treatment: 6 Clinic Level of Care Assessment Items TOOL 1 Quantity Score []  - Use when EandM and Procedure is performed on INITIAL visit 0 ASSESSMENTS - Nursing Assessment / Reassessment []  - General Physical Exam (combine w/ comprehensive assessment (listed just below) when performed on new 0 pt. evals) []  - 0 Comprehensive Assessment (HX, ROS, Risk Assessments,  Wounds Hx, etc.) ASSESSMENTS - Wound and Skin Assessment / Reassessment []  - Dermatologic / Skin Assessment (not related to wound area) 0 ASSESSMENTS - Ostomy and/or Continence Assessment and Care []  - Incontinence Assessment and Management 0 []  - 0 Ostomy Care Assessment and Management (repouching, etc.) PROCESS - Coordination of Care []  - Simple Patient / Family Education for ongoing care 0 []  - 0 Complex (extensive) Patient / Family Education for ongoing care []  - 0 Staff obtains Programmer, systems, Records, Test Results / Process Orders []  - 0 Staff telephones HHA, Nursing Homes / Clarify orders / etc []  - 0 Routine Transfer to another Facility (non-emergent condition) []  - 0 Routine Hospital Admission (non-emergent condition) []  - 0 New Admissions / Biomedical engineer / Ordering NPWT, Apligraf, etc. []  - 0 Emergency Hospital Admission (emergent condition) PROCESS - Special Needs []  - Pediatric / Minor Patient Management 0 []  - 0 Isolation Patient Management []  - 0 Hearing / Language / Visual special needs []  - 0 Assessment of Community assistance (transportation, D/C planning, etc.) []  - 0 Additional assistance / Altered mentation []  - 0 Support Surface(s) Assessment (bed, cushion, seat, etc.) INTERVENTIONS - Miscellaneous []  - External ear exam 0 []  - 0 Patient Transfer (multiple staff / Civil Service fast streamer / Similar devices) []  - 0 Simple Staple / Suture removal (25 or less) []  - 0 Complex Staple / Suture removal (26 or more) []  - 0 Hypo/Hyperglycemic Management (do not check if billed separately) []  - 0 Ankle / Brachial Index (ABI) - do not check if billed separately Has the patient been seen at the hospital within the last three years: Yes Total Score: 0 Level Of Care: ____ [name] ([id number]) Electronic Signature(s) Signed: 11/03/2021 3:44:08 PM By: Donnamarie Poag Entered By: Donnamarie Poag on 11/03/2021 13:34:51 [name]  ([id number]) -------------------------------------------------------------------------------- Encounter Discharge Information Details Patient Name: [name]. Date of Service: 11/03/2021 1:00 PM Medical Record Number: [id number] Patient Account Number:  [id number] Date of Birth/Sex: January 23, 1941 (80 y.o. F) Treating RN: Donnamarie Poag Primary Care Hien Perreira: Frazier Richards Other Clinician: Referring Faylynn Stamos: Frazier Richards Treating Raegyn Renda/Extender: Skipper Cliche in Treatment: 6 Encounter Discharge Information Items Discharge Condition: Stable Ambulatory Status: Wheelchair Discharge Destination: Home Transportation: Private Auto Accompanied By: grand daughter Schedule Follow-up Appointment: Yes Clinical Summary of Care: Electronic Signature(s) Signed: 11/03/2021 3:44:08 PM By: Donnamarie Poag Entered By: Donnamarie Poag on 11/03/2021 13:44:53 [name] ([id number]) -------------------------------------------------------------------------------- Lower Extremity Assessment Details Patient Name: [name]. Date of Service: 11/03/2021 1:00 PM Medical Record Number: [id number] Patient Account Number: [id number] Date of Birth/Sex: [birth year] (80 y.o. F) Treating RN: Donnamarie Poag Primary Care Tvisha Schwoerer: Frazier Richards Other Clinician: Referring Elmond Poehlman: Frazier Richards Treating Vinson Tietze/Extender: Jeri Cos Weeks in Treatment: 6 Edema Assessment Assessed: [Left: No] [Right: Yes] Edema: [Left: N] [Right: o] Vascular Assessment Pulses: Dorsalis Pedis Palpable: [Right:Yes] Electronic Signature(s) Signed: 11/03/2021 3:44:08 PM By: Donnamarie Poag Entered By: Donnamarie Poag on 11/03/2021 13:11:23 [name] ([id number]) -------------------------------------------------------------------------------- Multi Wound Chart Details Patient Name: [name]. Date of Service: 11/03/2021 1:00 PM Medical Record Number: [id number] Patient Account Number:  [id number] Date of Birth/Sex: [birth year] (80 y.o. 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, Other N/A Extremity Comorbid History: Chronic Obstructive Pulmonary Chronic Obstructive Pulmonary N/A Disease (COPD), Congestive Heart Disease (COPD), Congestive Heart Failure, Type II Diabetes Failure, Type II Diabetes Date Acquired: 05/21/2021 10/13/2021 N/A Weeks of Treatment: 6 2 N/A Wound Status: Open Open N/A Measurements L x W x D (cm) 0.3x0.2x0.1 0.5x0.8x0.1 N/A Area (cm) : 0.047 0.314 N/A Volume (cm) : 0.005 0.031 N/A % Reduction in Area: 70.10% 91.10% N/A % Reduction in Volume: 68.80% 91.20% N/A Classification: Grade 1 Full Thickness Without Exposed N/A Support Structures Exudate Amount: Medium Medium N/A Exudate Type: Serosanguineous Serous N/A Exudate Color: red, brown amber N/A Granulation Amount: None Present (0%) Large (67-100%) N/A Granulation Quality: N/A Red, Pink N/A Necrotic Amount: Large (67-100%) None Present (0%) N/A Exposed Structures: Fat Layer (Subcutaneous Tissue): Fat Layer (Subcutaneous Tissue): N/A Yes Yes Fascia: No Fascia: No Tendon: No Tendon: No Muscle: No Muscle: No Joint: No Joint: No Bone: No Bone: No Epithelialization: None Medium (34-66%) N/A Treatment Notes Electronic Signature(s) Signed: 11/03/2021 3:44:08 PM By: Donnamarie Poag Entered By: Donnamarie Poag on 11/03/2021 13:28:07 [name]  ([id number]) -------------------------------------------------------------------------------- [place] Details Patient Name: [name]. Date of Service: 11/03/2021 1:00 PM Medical Record Number: [id number] Patient Account Number: [id number] Date of Birth/Sex: Apr 11, 1941 (80 y.o. F) Treating RN: Donnamarie Poag Primary Care Arlis Yale: Frazier Richards Other Clinician: Referring Debra Colon: Frazier Richards Treating Skyeler Scalese/Extender: Skipper Cliche in Treatment: 6 Active Inactive Necrotic Tissue Nursing Diagnoses: Impaired tissue integrity related to necrotic/devitalized tissue Goals: Necrotic/devitalized tissue will be minimized in the wound bed Date Initiated: 09/21/2021 Target Resolution Date: 09/21/2021 Goal Status: Active Patient/caregiver will verbalize understanding of reason and process for debridement of necrotic tissue Date Initiated: 09/21/2021 Date Inactivated: 10/27/2021 Target Resolution Date: 09/21/2021 Goal Status: Met Interventions: Assess patient pain level pre-, during and post procedure and prior to discharge Provide education on necrotic tissue and debridement process Treatment Activities: Apply  topical anesthetic as ordered : 09/21/2021 Biologic debridement : 09/21/2021 Enzymatic debridement : 09/21/2021 Excisional debridement : 09/21/2021 Notes: Wound/Skin Impairment Nursing Diagnoses: Impaired tissue integrity Goals: Patient/caregiver will verbalize understanding of skin care regimen Date Initiated: 09/21/2021 Date Inactivated: 10/27/2021 Target Resolution Date: 09/21/2021 Goal Status: Met Ulcer/skin breakdown will have a volume reduction of 30% by week 4 Date Initiated: 09/21/2021 Date Inactivated: 10/27/2021 Target Resolution Date: 10/21/2021 Goal Status: Met Ulcer/skin breakdown will have a volume reduction of 50% by week 8 Date Initiated: 09/21/2021 Target Resolution Date: 11/21/2021 Goal Status: Active Ulcer/skin breakdown  will have a volume reduction of 80% by week 12 Date Initiated: 09/21/2021 Target Resolution Date: 12/21/2021 Goal Status: Active Ulcer/skin breakdown will heal within 14 weeks Date Initiated: 09/21/2021 Target Resolution Date: 01/21/2022 Goal Status: Active Interventions: Assess patient/caregiver ability to obtain necessary supplies Assess patient/caregiver ability to perform ulcer/skin care regimen upon admission and as needed Assess ulceration(s) every visit Provide education on ulcer and skin care BRYNLIE, DAZA ([id number]) Treatment Activities: Referred to DME Exander Shaul for dressing supplies : 09/21/2021 Skin care regimen initiated : 09/21/2021 Notes: Electronic Signature(s) Signed: 11/03/2021 3:44:08 PM By: Donnamarie Poag Entered By: Donnamarie Poag on 11/03/2021 13:12:35 [name] ([id number]) -------------------------------------------------------------------------------- Pain Assessment Details Patient Name: [name]. Date of Service: 11/03/2021 1:00 PM Medical Record Number: [id number] Patient Account Number: [id number] Date of Birth/Sex: Jan 30, 1941 (80 y.o. F) Treating RN: Donnamarie Poag Primary Care Ladavia Lindenbaum: Frazier Richards Other Clinician: Referring Jewel Mcafee: Frazier Richards Treating Brielyn Bosak/Extender: Skipper Cliche in Treatment: 6 Active Problems Location of Pain Severity and Description of Pain Patient Has Paino No Site Locations Rate the pain. Current Pain Level: 0 Pain Management and Medication Current Pain Management: Notes none at this time Electronic Signature(s) Signed: 11/03/2021 3:44:08 PM By: Donnamarie Poag Entered By: Donnamarie Poag on 11/03/2021 13:05:21 [name] ([id number]) -------------------------------------------------------------------------------- Patient/Caregiver Education Details Patient Name: [name]. Date of Service: 11/03/2021 1:00 PM Medical Record Number: [id number] Patient Account Number:  [id number] Date of Birth/Gender: Jun 01, 1941 (80 y.o. F) Treating RN: Donnamarie Poag Primary Care Physician: Frazier Richards Other Clinician: Referring Physician: Frazier Richards Treating Physician/Extender: Skipper Cliche in Treatment: 6 Education Assessment Education Provided To: Patient Education Topics Provided Basic Hygiene: Nutrition: Wound Debridement: Wound/Skin Impairment: Electronic Signature(s) Signed: 11/03/2021 3:44:08 PM By: Donnamarie Poag Entered By: Donnamarie Poag on 11/03/2021 13:35:15 [name] ([id number]) -------------------------------------------------------------------------------- Wound Assessment Details Patient Name: [name]. Date of Service: 11/03/2021 1:00 PM Medical Record Number: [id number] Patient Account Number: [id number] Date of Birth/Sex: 1941-08-26 (80 y.o. F) Treating RN: Donnamarie Poag Primary Care Kaiyan Luczak: Frazier Richards Other Clinician: Referring Tray Klayman: Frazier Richards Treating Satya Buttram/Extender: Skipper Cliche in Treatment: 6 Wound Status Wound Number: 1 Primary Diabetic Wound/Ulcer of the Lower Extremity Etiology: Wound Location: Right, Lateral Malleolus Wound Open Wounding Event: Gradually Appeared Status: Date Acquired: 05/21/2021 Comorbid Chronic Obstructive Pulmonary Disease (COPD), Weeks Of Treatment: 6 History: Congestive Heart Failure, Type II Diabetes Clustered Wound: No Photos Wound Measurements Length: (cm) 0.3 Width: (cm) 0.2 Depth: (cm) 0.1 Area: (cm) 0.047 Volume: (cm) 0.005 % Reduction in Area: 70.1% % Reduction in Volume: 68.8% Epithelialization: None Tunneling: No Undermining: No Wound Description Classification: Grade 1 Exudate Amount: Medium Exudate Type: Serosanguineous Exudate Color: red, brown Foul Odor After Cleansing: No Slough/Fibrino Yes Wound Bed Granulation Amount: None Present (0%) Exposed Structure Necrotic Amount: Large (67-100%) Fascia Exposed: No Necrotic  Quality: Adherent Slough Fat Layer (Subcutaneous Tissue) Exposed: Yes Tendon Exposed: No Muscle Exposed: No Joint Exposed: No  Bone Exposed: No Treatment Notes Wound #1 (Malleolus) Wound Laterality: Right, Lateral Cleanser Soap and Water Discharge Instruction: Gently cleanse wound with antibacterial soap, rinse and pat dry prior to dressing wounds Peri-Wound Care MACKINZEE, ROSZAK ([id number]) Topical Primary Dressing Hydrofera Blue Ready Transfer Foam, 2.5x2.5 (in/in) Discharge Instruction: CUt to fit wound-Apply Hydrofera Blue Ready to wound bed as directed Secondary Dressing Gauze Discharge Instruction: to cover blue Secured With 86M Medipore H Soft Cloth Surgical Tape, 2x2 (in/yd) Kerlix Roll Sterile or Non-Sterile 6-ply 4.5x4 (yd/yd) Discharge Instruction: Apply Kerlix as directed to secure Compression Wrap Compression Stockings Add-Ons Electronic Signature(s) Signed: 11/03/2021 3:44:08 PM By: Donnamarie Poag Entered By: Donnamarie Poag on 11/03/2021 13:10:04 [name] ([id number]) -------------------------------------------------------------------------------- Wound Assessment Details Patient Name: [name]. Date of Service: 11/03/2021 1:00 PM Medical Record Number: [id number] Patient Account Number: [id number] Date of Birth/Sex: 08-07-1941 (80 y.o. F) Treating RN: Donnamarie Poag Primary Care Sidda Humm: Frazier Richards Other Clinician: Referring Kekoa Fyock: Frazier Richards Treating Iona Stay/Extender: Skipper Cliche in Treatment: 6 Wound Status Wound Number: 2 Primary Trauma, Other Etiology: Wound Location: Left Hand - Dorsum Wound Open Wounding Event: Trauma Status: Date Acquired: 10/13/2021 Comorbid Chronic Obstructive Pulmonary Disease (COPD), Weeks Of Treatment: 2 History: Congestive Heart Failure, Type II Diabetes Clustered Wound: No Photos Wound Measurements Length: (cm) 0.5 Width: (cm) 0.8 Depth: (cm) 0.1 Area: (cm) 0.314 Volume: (cm)  0.031 % Reduction in Area: 91.1% % Reduction in Volume: 91.2% Epithelialization: Medium (34-66%) Tunneling: No Undermining: No Wound Description Classification: Full Thickness Without Exposed Support Structu Exudate Amount: Medium Exudate Type: Serous Exudate Color: amber res Foul Odor After Cleansing: No Slough/Fibrino No Wound Bed Granulation Amount: Large (67-100%) Exposed Structure Granulation Quality: Red, Pink Fascia Exposed: No Necrotic Amount: None Present (0%) Fat Layer (Subcutaneous Tissue) Exposed: Yes Tendon Exposed: No Muscle Exposed: No Joint Exposed: No Bone Exposed: No Treatment Notes Wound #2 (Hand - Dorsum) Wound Laterality: Left Cleanser Soap and Water Discharge Instruction: Gently cleanse wound with antibacterial soap, rinse and pat dry prior to dressing wounds Peri-Wound Care SERAPHINE, GUDIEL ([id number]) Topical Primary Dressing Hydrofera Blue Ready Transfer Foam, 2.5x2.5 (in/in) Discharge Instruction: 'cut to fit wound-Apply Hydrofera Blue Ready to wound bed as directed Secondary Dressing Gauze Discharge Instruction: to cover blue Secured With 86M Medipore H Soft Cloth Surgical Tape, 2x2 (in/yd) Conforming Stretch Gauze Bandage 4x75 (in/in) Discharge Instruction: Apply as directed Compression Wrap Compression Stockings Add-Ons Electronic Signature(s) Signed: 11/03/2021 3:44:08 PM By: Donnamarie Poag Entered By: Donnamarie Poag on 11/03/2021 13:11:05 [name] ([id number]) -------------------------------------------------------------------------------- [place] Details Patient Name: [name]. Date of Service: 11/03/2021 1:00 PM Medical Record Number: [id number] Patient Account Number: [id number] Date of Birth/Sex: 1941/04/17 (80 y.o. F) Treating RN: Donnamarie Poag Primary Care Allison Deshotels: Frazier Richards Other Clinician: Referring Jacinto Keil: Frazier Richards Treating Lynel Forester/Extender: Skipper Cliche in Treatment: 6 Vital  Signs Time Taken: 15:02 Temperature (F): 98.2 Height (in): 65 Pulse (bpm): 70 Weight (lbs): 98 Respiratory Rate (breaths/min): 16 Body Mass Index (BMI): 16.3 Blood Pressure (mmHg): 97/45 Reference Range: 80 - 120 mg / dl Airway Inhaled Oxygen Concentration (%): 2 Electronic Signature(s) Signed: 11/03/2021 3:44:08 PM By: Donnamarie Poag Entered ByDonnamarie Poag on 11/03/2021 13:05:08

## 2021-11-13 DIAGNOSIS — J9610 Chronic respiratory failure, unspecified whether with hypoxia or hypercapnia: Secondary | ICD-10-CM | POA: Diagnosis not present

## 2021-11-13 DIAGNOSIS — J449 Chronic obstructive pulmonary disease, unspecified: Secondary | ICD-10-CM | POA: Diagnosis not present

## 2021-11-13 DIAGNOSIS — I509 Heart failure, unspecified: Secondary | ICD-10-CM | POA: Diagnosis not present

## 2021-11-13 DIAGNOSIS — Z9981 Dependence on supplemental oxygen: Secondary | ICD-10-CM | POA: Diagnosis not present

## 2021-11-13 DIAGNOSIS — Z87891 Personal history of nicotine dependence: Secondary | ICD-10-CM | POA: Diagnosis not present

## 2021-11-20 ENCOUNTER — Encounter: Payer: PPO | Admitting: Physician Assistant

## 2021-11-20 ENCOUNTER — Other Ambulatory Visit: Payer: Self-pay

## 2021-11-20 DIAGNOSIS — E11622 Type 2 diabetes mellitus with other skin ulcer: Secondary | ICD-10-CM | POA: Diagnosis not present

## 2021-11-20 DIAGNOSIS — L97312 Non-pressure chronic ulcer of right ankle with fat layer exposed: Secondary | ICD-10-CM | POA: Diagnosis not present

## 2021-11-20 DIAGNOSIS — J449 Chronic obstructive pulmonary disease, unspecified: Secondary | ICD-10-CM | POA: Diagnosis not present

## 2021-11-20 DIAGNOSIS — I5042 Chronic combined systolic (congestive) and diastolic (congestive) heart failure: Secondary | ICD-10-CM | POA: Diagnosis not present

## 2021-11-20 NOTE — Progress Notes (Addendum)
[name], [name] ([id number]) Visit Report for 11/20/2021 Chief Complaint Document Details Patient Name: [name], [name]. Date of Service: 11/20/2021 3:15 PM Medical Record Number: [id number] Patient Account Number: [id number] Date of Birth/Sex: [birth year] (80 y.o. F) Treating RN: Donnamarie Poag Primary Care Provider: Frazier Richards Other Clinician: Referring Provider: Frazier Richards Treating Provider/Extender: Skipper Cliche in Treatment: 8 Information Obtained from: Patient Chief Complaint Right lateral ankle ulcer and left hand skin tear Electronic Signature(s) Signed: 11/20/2021 3:52:01 PM By: Worthy Keeler PA-C Entered By: Worthy Keeler on 11/20/2021 15:52:00 [name] ([id number]) -------------------------------------------------------------------------------- Debridement Details Patient Name: [name]. Date of Service: 11/20/2021 3:15 PM Medical Record Number: [id number] Patient Account Number: [id number] Date of Birth/Sex: 1941/07/03 (80 y.o. F) Treating RN: Donnamarie Poag Primary Care Provider: Frazier Richards Other Clinician: Referring Provider: Frazier Richards Treating Provider/Extender: Skipper Cliche in Treatment: 8 Debridement Performed for Wound #1 Right,Lateral Malleolus Assessment: Performed By: Physician Tommie Sams., PA-C Debridement Type: Debridement Severity of Tissue Pre Debridement: Fat layer exposed Level of Consciousness (Pre- Awake and Alert procedure): Pre-procedure Verification/Time Out Yes - 16:18 Taken: Start Time: 16:18 Pain Control: Lidocaine Total Area Debrided (L x W): 0.2 (cm) x 0.3 (cm) = 0.06 (cm) Tissue and other material Viable, Non-Viable, Slough, Subcutaneous, Slough debrided: Level: Skin/Subcutaneous Tissue Debridement Description: Excisional Instrument: Curette Bleeding: Minimum Hemostasis Achieved: Pressure Response to Treatment: Procedure was tolerated well Level of Consciousness  (Post- Awake and Alert procedure): Post Debridement Measurements of Total Wound Length: (cm) 0.2 Width: (cm) 0.3 Depth: (cm) 0.1 Volume: (cm) 0.005 Character of Wound/Ulcer Post Debridement: Improved Severity of Tissue Post Debridement: Fat layer exposed Post Procedure Diagnosis Same as Pre-procedure Electronic Signature(s) Signed: 11/20/2021 4:42:40 PM By: Donnamarie Poag Signed: 11/20/2021 5:26:28 PM By: Worthy Keeler PA-C Entered By: Donnamarie Poag on 11/20/2021 16:21:20 [name] ([id number]) -------------------------------------------------------------------------------- HPI Details Patient Name: [name]. Date of Service: 11/20/2021 3:15 PM Medical Record Number: [id number] Patient Account Number: [id number] Date of Birth/Sex: 1941/06/15 (80 y.o. F) Treating RN: Donnamarie Poag Primary Care Provider: Frazier Richards Other Clinician: Referring Provider: Frazier Richards Treating Provider/Extender: Skipper Cliche in Treatment: 8 History of Present Illness HPI Description: 09/21/2021 upon evaluation today patient appears to be doing somewhat poorly in regard to her wound on her right lateral ankle. She has been tolerating the dressing changes without complication. Fortunately there does not not appear to be any signs of active infection systemically at this time which is great news. Likely also do not see any major issues which is good news. Nonetheless the biggest problem is that even with attempting to treat this and try to get things under control on her own she is really not been able to get this healed. She does tend to sleep on her right side exclusively due to the fact that she is not able to lay any other way due to pain. She has issues with her right hip. She is a diabetic her last hemoglobin A1c was 5.6 however this should not be affecting her ability to heal she also had an MRI in July which was negative for osteomyelitis. Overall everything checks out  okay but nonetheless she is still experiencing the open wound that she just cannot get closed. She does wear oxygen 24/7. She has a history of congestive heart failure, COPD, and again the diabetes mellitus type 2. 09/29/2021 upon evaluation today patient appears to be doing roughly the same medial movement better as far as some of the  necrotic tissue on the surface of the wound is concerned it is kind of gently coming off little by little. With that being said I do not see any signs of active infection systemically at this point which is good news. 10/06/2021 upon evaluation today patient appears to be doing excellent in regard to her wound from an infection standpoint I do not see any issues here currently. Unfortunately she continues develop slough over the surface of the wound where using collagen I am not certain that skin to be the best way to go going forward. I really think she may do well with Iodoflex. 10/13/2021 upon evaluation today patient appears to be doing well with regard to her wound. I actually feel like this is showing signs of improvement with regard to the overall appearance I do believe the Iodoflex has been helpful. 10/20/2021 upon evaluation today patient appears to be doing a little better in regard to her ankle ulcer. With that being said she has a new wound on her hand as well due to skin tear that occurred on the way home last week after I saw her. Fortunately there does not appear to be any evidence of active infection at this time. No fevers, chills, nausea, vomiting, or diarrhea. 10/27/2021 upon evaluation today 10/27/2021 upon evaluation today patient appears to be doing better in regard to the wound on the hand as well as the ankle both are showing signs of being smaller which is great news. I do not see any signs of infection which is also great news. In general I think that we are headed in the appropriate direction. 11/03/21 upon evaluation today patient appears to  be doing well with regard to her wounds. She has been tolerating the dressing changes without complication. Fortunately there does not appear to be any signs of active infection at this time which is great news. No fevers, chills, nausea, vomiting, or diarrhea. 11/20/2021 upon evaluation today patient appears to be doing decently well in regard to her wounds. This is good news. Fortunately the hand is almost completely healed and the foot is smaller although this week it is about the same compared to what we saw previous. I do not see any evidence of infection there is little bit of dry skin we will continue to remove this is still taking a little bit longer than what I would expect to heal this makes me concerned about the fact of possibly needing to look into a more formal arterial study with regard to her arterial flow to see what they stand. Electronic Signature(s) Signed: 11/20/2021 5:10:58 PM By: Worthy Keeler PA-C Entered By: Worthy Keeler on 11/20/2021 17:10:56 [name] ([id number]) -------------------------------------------------------------------------------- Physical Exam Details Patient Name: [name]. Date of Service: 11/20/2021 3:15 PM Medical Record Number: [id number] Patient Account Number: [id number] Date of Birth/Sex: 01-14-1941 (80 y.o. F) Treating RN: Donnamarie Poag Primary Care Provider: Frazier Richards Other Clinician: Referring Provider: Frazier Richards Treating Provider/Extender: Skipper Cliche in Treatment: 8 Constitutional Well-nourished and well-hydrated in no acute distress. Respiratory normal breathing without difficulty. Psychiatric this patient is able to make decisions and demonstrates good insight into disease process. Alert and Oriented x 3. pleasant and cooperative. Notes Patient's wound bed currently showed signs of okay granulation epithelization in regard to the hand in regard to the ankle region this actually showed  signs of some slough and biofilm on the surface of the wound as well as some dry drainage. Subsequently I did actually [employer]  perform debridement to clear some way of the necrotic debris today she tolerated that without complication. Post debridement this appears better although even with this debridement there is still has not a great wound surface. This reinforces the idea I think that we may need to check for arterial flow. Electronic Signature(s) Signed: 11/20/2021 5:11:57 PM By: Worthy Keeler PA-C Entered By: Worthy Keeler on 11/20/2021 17:11:57 [name] ([id number]) -------------------------------------------------------------------------------- Physician Orders Details Patient Name: [name]. Date of Service: 11/20/2021 3:15 PM Medical Record Number: [id number] Patient Account Number: [id number] Date of Birth/Sex: 1941-12-21 (80 y.o. F) Treating RN: Donnamarie Poag Primary Care Provider: Frazier Richards Other Clinician: Referring Provider: Frazier Richards Treating Provider/Extender: Skipper Cliche in Treatment: 8 Verbal / Phone Orders: No Diagnosis Coding ICD-10 Coding Code Description E11.622 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 S61.412A Laceration without foreign body of left hand, initial encounter J44.9 Chronic obstructive pulmonary disease, unspecified I50.42 Chronic combined systolic (congestive) and diastolic (congestive) heart failure Z99.81 Dependence on supplemental oxygen Follow-up Appointments o Return Appointment in 1 week. o Nurse Visit as needed Bathing/ Shower/ Hygiene o May shower; gently cleanse wound with antibacterial soap, rinse and pat dry prior to dressing wounds o No tub bath. Edema Control - Lymphedema / Segmental Compressive Device / Other o Elevate leg(s) parallel to the floor when sitting. o DO YOUR BEST to sleep in the bed at  night. DO NOT sleep in your recliner. Long hours of sitting in a recliner leads to swelling of the legs and/or potential wounds on your backside. Additional Orders / Instructions o Follow Nutritious Diet and Increase Protein Intake o Other: - Drink more non caffeine beverages to keep Blood pressure normal Oxygen Administration o Pulse Oxymetry measurement in clinic - PRN o While patient is in clinic, provide supplemental oxygen via nasal cannula Wound Treatment Wound #1 - Malleolus Wound Laterality: Right, Lateral Cleanser: Soap and Water 3 x Per Week/30 Days Discharge Instructions: Gently cleanse wound with antibacterial soap, rinse and pat dry prior to dressing wounds Primary Dressing: Hydrofera Blue Ready Transfer Foam, 2.5x2.5 (in/in) 3 x Per Week/30 Days Discharge Instructions: CUt to fit wound-Apply Hydrofera Blue Ready to wound bed as directed Secondary Dressing: Gauze 3 x Per Week/30 Days Discharge Instructions: to cover blue Secured With: 21M Medipore H Soft Cloth Surgical Tape, 2x2 (in/yd) (Generic) 3 x Per Week/30 Days Secured With: [employer] Sterile or Non-Sterile 6-ply 4.5x4 (yd/yd) (Generic) 3 x Per Week/30 Days Discharge Instructions: Apply Kerlix as directed to secure Wound #2 - Hand - Dorsum Wound Laterality: Left Cleanser: Soap and Water 3 x Per Week/30 Days Discharge Instructions: Gently cleanse wound with antibacterial soap, rinse and pat dry prior to dressing wounds Primary Dressing: oil emulsion 3 x Per Week/30 Days Discharge Instructions: cover wound [name], [name] ([id number]) Secondary Dressing: Coverlet Latex-Free Fabric Adhesive Dressings 3 x Per Week/30 Days Discharge Instructions: 1.5 x 2 Consults o Vascular - [facility] will do a blood flow study-if they don't call you, then call them for appt by next week-bilateral ABI/TBI - (ICD10 L97.312 - Non-pressure chronic ulcer of right ankle with fat layer exposed) Electronic  Signature(s) Signed: 11/20/2021 4:42:40 PM By: Donnamarie Poag Signed: 11/20/2021 5:26:28 PM By: Worthy Keeler PA-C Entered By: Donnamarie Poag on 11/20/2021 16:31:32 [name] ([id number]) -------------------------------------------------------------------------------- Problem List Details Patient Name: [name]. Date of Service: 11/20/2021 3:15 PM Medical  Record Number: [id number] Patient Account Number: [id number] Date of Birth/Sex: 01-02-1941 (80 y.o. F) Treating RN: Donnamarie Poag Primary Care Provider: Frazier Richards Other Clinician: Referring Provider: Frazier Richards Treating Provider/Extender: Skipper Cliche in Treatment: 8 Active Problems ICD-10 Encounter Code Description Active Date MDM Diagnosis E11.622 Type 2 diabetes mellitus with other skin ulcer 09/21/2021 No Yes L97.312 Non-pressure chronic ulcer of right ankle with fat layer exposed 09/21/2021 No Yes S61.412A Laceration without foreign body of left hand, initial encounter 10/20/2021 No Yes J44.9 Chronic obstructive pulmonary disease, unspecified 09/21/2021 No Yes I50.42 Chronic combined systolic (congestive) and diastolic (congestive) heart 09/21/2021 No Yes failure Z99.81 Dependence on supplemental oxygen 09/21/2021 No Yes Inactive Problems Resolved Problems Electronic Signature(s) Signed: 11/20/2021 3:51:53 PM By: Worthy Keeler PA-C Entered By: Worthy Keeler on 11/20/2021 15:51:53 [name] ([id number]) -------------------------------------------------------------------------------- Progress Note Details Patient Name: [name]. Date of Service: 11/20/2021 3:15 PM Medical Record Number: [id number] Patient Account Number: [id number] Date of Birth/Sex: [birth year] (80 y.o. F) Treating RN: Donnamarie Poag Primary Care Provider: Frazier Richards Other Clinician: Referring Provider: Frazier Richards Treating Provider/Extender: Skipper Cliche in Treatment: 8 Subjective Chief  Complaint Information obtained from Patient Right lateral ankle ulcer and left hand skin tear History of Present Illness (HPI) 09/21/2021 upon evaluation today patient appears to be doing somewhat poorly in regard to her wound on her right lateral ankle. She has been tolerating the dressing changes without complication. Fortunately there does not not appear to be any signs of active infection systemically at this time which is great news. Likely also do not see any major issues which is good news. Nonetheless the biggest problem is that even with attempting to treat this and try to get things under control on her own she is really not been able to get this healed. She does tend to sleep on her right side exclusively due to the fact that she is not able to lay any other way due to pain. She has issues with her right hip. She is a diabetic her last hemoglobin A1c was 5.6 however this should not be affecting her ability to heal she also had an MRI in July which was negative for osteomyelitis. Overall everything checks out okay but nonetheless she is still experiencing the open wound that she just cannot get closed. She does wear oxygen 24/7. She has a history of congestive heart failure, COPD, and again the diabetes mellitus type 2. 09/29/2021 upon evaluation today patient appears to be doing roughly the same medial movement better as far as some of the necrotic tissue on the surface of the wound is concerned it is kind of gently coming off little by little. With that being said I do not see any signs of active infection systemically at this point which is good news. 10/06/2021 upon evaluation today patient appears to be doing excellent in regard to her wound from an infection standpoint I do not see any issues here currently. Unfortunately she continues develop slough over the surface of the wound where using collagen I am not certain that skin to be the best way to go going forward. I really think she  may do well with Iodoflex. 10/13/2021 upon evaluation today patient appears to be doing well with regard to her wound. I actually feel like this is showing signs of improvement with regard to the overall appearance I do believe the Iodoflex has been helpful. 10/20/2021 upon evaluation today patient appears to be doing a little better in  regard to her ankle ulcer. With that being said she has a new wound on her hand as well due to skin tear that occurred on the way home last week after I saw her. Fortunately there does not appear to be any evidence of active infection at this time. No fevers, chills, nausea, vomiting, or diarrhea. 10/27/2021 upon evaluation today 10/27/2021 upon evaluation today patient appears to be doing better in regard to the wound on the hand as well as the ankle both are showing signs of being smaller which is great news. I do not see any signs of infection which is also great news. In general I think that we are headed in the appropriate direction. 11/03/21 upon evaluation today patient appears to be doing well with regard to her wounds. She has been tolerating the dressing changes without complication. Fortunately there does not appear to be any signs of active infection at this time which is great news. No fevers, chills, nausea, vomiting, or diarrhea. 11/20/2021 upon evaluation today patient appears to be doing decently well in regard to her wounds. This is good news. Fortunately the hand is almost completely healed and the foot is smaller although this week it is about the same compared to what we saw previous. I do not see any evidence of infection there is little bit of dry skin we will continue to remove this is still taking a little bit longer than what I would expect to heal this makes me concerned about the fact of possibly needing to look into a more formal arterial study with regard to her arterial flow to see what they  stand. Objective Constitutional Well-nourished and well-hydrated in no acute distress. Vitals Time Taken: 4:03 PM, Height: 65 in, Weight: 98 lbs, BMI: 16.3, Temperature: 98.3 F, Pulse: 70 bpm, Respiratory Rate: 16 breaths/min, Blood Pressure: 101/37 mmHg. General Notes: PO non caffeine fluid encouraged; stated only coffee and tea+16 oz all day Respiratory normal breathing without difficulty. [name], [name] ([id number]) Psychiatric this patient is able to make decisions and demonstrates good insight into disease process. Alert and Oriented x 3. pleasant and cooperative. General Notes: Patient's wound bed currently showed signs of okay granulation epithelization in regard to the hand in regard to the ankle region this actually showed signs of some slough and biofilm on the surface of the wound as well as some dry drainage. Subsequently I did actually [employer] perform debridement to clear some way of the necrotic debris today she tolerated that without complication. Post debridement this appears better although even with this debridement there is still has not a great wound surface. This reinforces the idea I think that we may need to check for arterial flow. Integumentary (Hair, Skin) Wound #1 status is Open. Original cause of wound was Gradually Appeared. The date acquired was: 05/21/2021. The wound has been in treatment 8 weeks. The wound is located on the Right,Lateral Malleolus. The wound measures 0.2cm length x 0.3cm width x 0.1cm depth; 0.047cm^2 area and 0.005cm^3 volume. There is Fat Layer (Subcutaneous Tissue) exposed. There is no tunneling or undermining noted. There is a medium amount of serosanguineous drainage noted. There is no granulation within the wound bed. There is a large (67-100%) amount of necrotic tissue within the wound bed including Adherent Slough. Wound #2 status is Open. Original cause of wound was Trauma. The date acquired was: 10/13/2021. The wound has been in  treatment 4 weeks. The wound is located on the Left Hand - Dorsum. The wound measures  0.3cm length x 0.2cm width x 0.1cm depth; 0.047cm^2 area and 0.005cm^3 volume. There is Fat Layer (Subcutaneous Tissue) exposed. There is no tunneling or undermining noted. There is a medium amount of serosanguineous drainage noted. There is large (67-100%) red, pink granulation within the wound bed. There is a small (1-33%) amount of necrotic tissue within the wound bed including Adherent Slough. 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 There was a Excisional Skin/Subcutaneous Tissue Debridement with a total area of 0.06 sq cm performed by Tommie Sams., PA-C. With the following instrument(s): Curette to remove Viable and Non-Viable tissue/material. Material removed includes Subcutaneous Tissue and Slough and after achieving pain control using Lidocaine. A time out was conducted at 16:18, prior to the start of the procedure. A Minimum amount of bleeding was controlled with Pressure. The procedure was tolerated well. Post Debridement Measurements: 0.2cm length x 0.3cm width x 0.1cm depth; 0.005cm^3 volume. Character of Wound/Ulcer Post Debridement is improved. Severity of Tissue Post Debridement is: Fat layer exposed. Post procedure Diagnosis Wound #1: Same as Pre-Procedure Plan Follow-up Appointments: Return Appointment in 1 week. Nurse Visit as needed Bathing/ Shower/  Hygiene: May shower; gently cleanse wound with antibacterial soap, rinse and pat dry prior to dressing wounds No tub bath. Edema Control - Lymphedema / Segmental Compressive Device / Other: Elevate leg(s) parallel to the floor when sitting. DO YOUR BEST to sleep in the bed at night. DO NOT sleep in your recliner. Long hours of sitting in a recliner leads to swelling of the legs and/or potential wounds on your backside. Additional Orders / Instructions: Follow Nutritious Diet and Increase Protein Intake [name], [name] ([id number]) Other: - Drink more non caffeine beverages to keep Blood pressure normal Oxygen Administration: Pulse Oxymetry measurement in clinic - PRN While patient is in clinic, provide supplemental oxygen via nasal cannula Consults ordered were: Vascular - [facility] will do a blood flow study-if they don't call you, then call them for appt by next week-bilateral ABI/TBI WOUND #1: - Malleolus Wound Laterality: Right, Lateral Cleanser: Soap and Water 3 x Per Week/30 Days Discharge Instructions: Gently cleanse wound with antibacterial soap, rinse and pat dry prior to dressing wounds Primary Dressing: Hydrofera Blue Ready Transfer Foam, 2.5x2.5 (in/in) 3 x Per Week/30 Days Discharge Instructions: CUt to fit wound-Apply Hydrofera Blue Ready to wound bed as directed Secondary Dressing: Gauze 3 x Per Week/30 Days Discharge Instructions: to cover blue Secured With: 54M Medipore H Soft Cloth Surgical Tape, 2x2 (in/yd) (Generic) 3 x Per Week/30 Days Secured With: [employer] Sterile or Non-Sterile 6-ply 4.5x4 (yd/yd) (Generic) 3 x Per Week/30 Days Discharge Instructions: Apply Kerlix as directed to secure WOUND #2: - Hand - Dorsum Wound Laterality: Left Cleanser: Soap and Water 3 x Per Week/30 Days Discharge Instructions: Gently cleanse wound with antibacterial soap, rinse and pat dry prior to dressing wounds Primary Dressing: oil emulsion 3 x Per Week/30  Days Discharge Instructions: cover wound Secondary Dressing: Coverlet Latex-Free Fabric Adhesive Dressings 3 x Per Week/30 Days Discharge Instructions: 1.5 x 2 1. Would recommend him  going to continue with wound care measures as before and the patient is in agreement with plan. This includes the use of the [facility] which I think is doing a decent job in regard to the ankle region. 2. I am also can recommend that we use just an oil emulsion dressing over the hand I think this will help this heal up and hopefully should be healed by next week. 3. I am also can recommend the patient continue to monitor for any signs of worsening or infection and can to send her for vascular evaluation for a formal arterial study with ABIs and TBI's. We will see patient back for reevaluation in 1 week here in the clinic. If anything worsens or changes patient will contact our office for additional recommendations. Electronic Signature(s) Signed: 11/20/2021 5:13:50 PM By: Worthy Keeler PA-C Entered By: Worthy Keeler on 11/20/2021 17:13:50 [name] ([id number]) -------------------------------------------------------------------------------- SuperBill Details Patient Name: [name]. Date of Service: 11/20/2021 Medical Record Number: [id number] Patient Account Number: [id number] Date of Birth/Sex: August 05, 1941 (80 y.o. 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 S61.412A Laceration without foreign body of left hand, initial encounter J44.9 Chronic obstructive pulmonary disease, unspecified I50.42 Chronic combined systolic (congestive) and diastolic (congestive) heart  failure Z99.81 Dependence on supplemental oxygen Facility Procedures CPT4 Code: 17408144 Description: 81856 - DEB SUBQ TISSUE 20 SQ CM/< Modifier: Quantity: 1 CPT4 Code: Description: ICD-10 Diagnosis Description L97.312 Non-pressure chronic ulcer of right ankle with fat layer exposed Modifier: Quantity: Physician Procedures CPT4 Code: 3149702 Description: 99214 - WC PHYS LEVEL 4 - EST PT Modifier: Quantity: 1 CPT4 Code: Description: ICD-10 Diagnosis Description 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

## 2021-11-20 NOTE — Progress Notes (Addendum)
VALARI, TAYLOR ([id number]) Visit Report for 11/20/2021 Arrival Information Details Patient Name: [name], [name]. Date of Service: 11/20/2021 3:15 PM Medical Record Number: [id number] Patient Account Number: [id number] Date of Birth/Sex: September 22, 1941 (80 y.o. F) Treating RN: Donnamarie Poag Primary Care Shadell Brenn: Frazier Richards Other Clinician: Referring Shericka Johnstone: Frazier Richards Treating Arzell Mcgeehan/Extender: Skipper Cliche in Treatment: 8 Visit Information History Since Last Visit Added or deleted any medications: No Patient Arrived: Wheel Chair Had a fall or experienced change in No Arrival Time: 16:02 activities of daily living that may affect Accompanied By: grand daughter risk of falls: Transfer Assistance: [employer] Patient Hospitalized since last visit: No Lift Has Dressing in Place as Prescribed: Yes Patient Identification Verified: Yes Pain Present Now: No Secondary Verification Process Completed: Yes Patient Requires Transmission-Based No Precautions: Patient Has Alerts: No Electronic Signature(s) Signed: 11/20/2021 4:42:40 PM By: Donnamarie Poag Entered By: Donnamarie Poag on 11/20/2021 16:04:10 [name] ([id number]) -------------------------------------------------------------------------------- Clinic Level of Care Assessment Details Patient Name: [name]. Date of Service: 11/20/2021 3:15 PM Medical Record Number: [id number] Patient Account Number: [id number] Date of Birth/Sex: 16-Feb-1941 (80 y.o. F) Treating RN: Donnamarie Poag Primary Care Toniette Devera: Frazier Richards Other Clinician: Referring Waylyn Tenbrink: Frazier Richards Treating Lynnel Zanetti/Extender: Skipper Cliche in Treatment: 8 Clinic Level of Care Assessment Items TOOL 1 Quantity Score _0  - Use when EandM and Procedure is performed on INITIAL visit 0 ASSESSMENTS - Nursing Assessment / Reassessment _1  - General Physical Exam (combine w/ comprehensive assessment (listed just below) when  performed on new 0 pt. evals) _2  - 0 Comprehensive Assessment (HX, ROS, Risk Assessments, Wounds Hx, etc.) ASSESSMENTS - Wound and Skin Assessment / Reassessment _3  - Dermatologic / Skin Assessment (not related to wound area) 0 ASSESSMENTS - Ostomy and/or Continence Assessment and Care _4  - Incontinence Assessment and Management 0 _5  - 0 Ostomy Care Assessment and Management (repouching, etc.) PROCESS - Coordination of Care _6  - Simple Patient / Family Education for ongoing care 0 _7  - 0 Complex (extensive) Patient / Family Education for ongoing care _8  - 0 Staff obtains Programmer, systems, Records, Test Results / Process Orders _9  - 0 Staff telephones HHA, Nursing Homes / Clarify orders / etc _10  - 0 Routine Transfer to another Facility (non-emergent condition) _11  - 0 Routine Hospital Admission (non-emergent condition) _12  - 0 New Admissions / Biomedical engineer / Ordering NPWT, Apligraf, etc. _13  - 0 Emergency Hospital Admission (emergent condition) PROCESS - Special Needs _14  - Pediatric / Minor Patient Management 0 _15  - 0 Isolation Patient Management _16  - 0 Hearing / Language / Visual special needs _17  - 0 Assessment of Community assistance (transportation, D/C planning, etc.) _18  - 0 Additional assistance / Altered mentation _19  - 0 Support Surface(s) Assessment (bed, cushion, seat, etc.) INTERVENTIONS - Miscellaneous _20  - External ear exam 0 _21  - 0 Patient Transfer (multiple staff / Civil Service fast streamer / Similar devices) _22  - 0 Simple Staple / Suture removal (25 or less) _23  - 0 Complex Staple / Suture removal (26 or more) _24  - 0 Hypo/Hyperglycemic Management (do not check if billed separately) _25  - 0 Ankle / Brachial Index (ABI) - do not check if billed separately Has the patient been seen at the hospital within the last three years: Yes Total Score: 0 Level Of Care: ____ [name] ([id number]) Electronic Signature(s) Signed: 11/20/2021 4:42:40 PM By: Donnamarie Poag Entered By: Donnamarie Poag on 11/20/2021 16:32:41 [name] ([id number]) -------------------------------------------------------------------------------- Encounter Discharge Information Details Patient Name: [name]. Date of Service:  11/20/2021 3:15 PM Medical Record Number: [id number] Patient Account Number: [id number] Date of Birth/Sex: 1941/03/02 (80 y.o. F) Treating RN: Donnamarie Poag Primary Care Josiel Gahm: Frazier Richards Other Clinician: Referring Karion Cudd: Frazier Richards Treating Taraann Olthoff/Extender: Skipper Cliche in Treatment: 8 Encounter Discharge Information Items Post Procedure Vitals Discharge Condition: Stable Temperature (F): 98.3 Ambulatory Status: Wheelchair Pulse (bpm): 70 Discharge Destination: Home Respiratory Rate (breaths/min): 16 Transportation: Private Auto Blood Pressure (mmHg): 101/37 Accompanied By: Teddy Spike Schedule Follow-up Appointment: Yes Clinical Summary of Care: Electronic Signature(s) Signed: 11/20/2021 4:42:40 PM By: Donnamarie Poag Entered By: Donnamarie Poag on 11/20/2021 16:34:07 [name] ([id number]) -------------------------------------------------------------------------------- Lower Extremity Assessment Details Patient Name: [name]. Date of Service: 11/20/2021 3:15 PM Medical Record Number: [id number] Patient Account Number: [id number] Date of Birth/Sex: March 03, 1941 (80 y.o. F) Treating RN: Donnamarie Poag Primary Care Viviano Bir: Frazier Richards Other Clinician: Referring Coral Soler: Frazier Richards Treating Keyuna Cuthrell/Extender: Jeri Cos Weeks in Treatment: 8 Edema Assessment Assessed: [Left: No] [Right: Yes] Edema: [Left: N] [Right: o] Vascular Assessment Pulses: Dorsalis Pedis Palpable: [Right:Yes] Electronic Signature(s) Signed: 11/20/2021 4:42:40 PM By: Donnamarie Poag Entered By: Donnamarie Poag on 11/20/2021 16:11:24 [name]  ([id number]) -------------------------------------------------------------------------------- Multi Wound Chart Details Patient Name: [name]. Date of Service: 11/20/2021 3:15 PM Medical Record Number: [id number] Patient Account Number: [id number] Date of Birth/Sex: 11-Jan-1941 (80 y.o. 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, Other N/A Extremity Comorbid History: Chronic Obstructive Pulmonary Chronic Obstructive Pulmonary N/A Disease (COPD), Congestive Heart Disease (COPD), Congestive Heart Failure, Type II Diabetes Failure, Type II Diabetes Date Acquired: 05/21/2021 10/13/2021 N/A Weeks of Treatment: 8 4 N/A Wound Status: Open Open N/A Measurements L x W x D (cm) 0.2x0.3x0.1 0.3x0.2x0.1 N/A Area (cm) : 0.047 0.047 N/A Volume (cm) : 0.005 0.005 N/A % Reduction in Area: 70.10% 98.70% N/A % Reduction in Volume: 68.80% 98.60% N/A Classification: Grade 1 Full Thickness Without Exposed N/A Support Structures Exudate Amount: Medium Medium N/A Exudate Type: Serosanguineous Serosanguineous N/A Exudate Color: red, brown red, brown N/A Granulation Amount: None Present (0%) Large (67-100%) N/A Granulation Quality: N/A Red, Pink N/A Necrotic Amount: Large (67-100%) Small (1-33%) N/A Exposed Structures: Fat Layer (Subcutaneous Tissue): Fat Layer (Subcutaneous Tissue): N/A Yes Yes Fascia: No Fascia: No Tendon: No Tendon: No Muscle: No Muscle: No Joint: No Joint: No Bone:  No Bone: No Epithelialization: None Medium (34-66%) N/A Treatment Notes Electronic Signature(s) Signed: 11/20/2021 4:42:40 PM By: Donnamarie Poag Entered By: Donnamarie Poag on 11/20/2021 16:15:48 [name] ([id number]) -------------------------------------------------------------------------------- [place] Details Patient Name: [name]. Date of Service: 11/20/2021 3:15 PM Medical Record Number: [id number] Patient Account Number: [id number] Date of Birth/Sex: 07-22-1941 (80 y.o. F) Treating RN: Donnamarie Poag Primary Care Amarisa Wilinski: Frazier Richards Other Clinician: Referring Benjamyn Hestand: Frazier Richards Treating Cyrah Mclamb/Extender: Skipper Cliche in Treatment: 8 Active Inactive Necrotic Tissue Nursing Diagnoses: Impaired tissue integrity related to necrotic/devitalized tissue Goals: Necrotic/devitalized tissue will be minimized in the wound bed Date Initiated: 09/21/2021 Target Resolution Date: 09/21/2021 Goal Status: Active Patient/caregiver will verbalize understanding of reason and process for debridement of necrotic tissue Date Initiated: 09/21/2021 Date Inactivated: 10/27/2021 Target Resolution Date: 09/21/2021 Goal Status:  Met Interventions: Assess patient pain level pre-, during and post procedure and prior to discharge Provide education on necrotic tissue and debridement process Treatment Activities: Apply topical anesthetic as ordered : 09/21/2021 Biologic debridement : 09/21/2021 Enzymatic debridement : 09/21/2021 Excisional debridement : 09/21/2021 Notes: Wound/Skin Impairment Nursing Diagnoses: Impaired tissue integrity Goals: Patient/caregiver will verbalize understanding of skin care regimen Date Initiated: 09/21/2021 Date Inactivated: 10/27/2021 Target Resolution Date: 09/21/2021 Goal Status: Met Ulcer/skin breakdown will have a volume reduction of 30% by week 4 Date Initiated: 09/21/2021 Date Inactivated: 10/27/2021 Target  Resolution Date: 10/21/2021 Goal Status: Met Ulcer/skin breakdown will have a volume reduction of 50% by week 8 Date Initiated: 09/21/2021 Target Resolution Date: 11/21/2021 Goal Status: Active Ulcer/skin breakdown will have a volume reduction of 80% by week 12 Date Initiated: 09/21/2021 Target Resolution Date: 12/21/2021 Goal Status: Active Ulcer/skin breakdown will heal within 14 weeks Date Initiated: 09/21/2021 Target Resolution Date: 01/21/2022 Goal Status: Active Interventions: Assess patient/caregiver ability to obtain necessary supplies Assess patient/caregiver ability to perform ulcer/skin care regimen upon admission and as needed Assess ulceration(s) every visit Provide education on ulcer and skin care [name], [name] ([id number]) Treatment Activities: Referred to DME Dynisha Due for dressing supplies : 09/21/2021 Skin care regimen initiated : 09/21/2021 Notes: Electronic Signature(s) Signed: 11/20/2021 4:42:40 PM By: Donnamarie Poag Entered By: Donnamarie Poag on 11/20/2021 16:11:40 [name] ([id number]) -------------------------------------------------------------------------------- Pain Assessment Details Patient Name: [name]. Date of Service: 11/20/2021 3:15 PM Medical Record Number: [id number] Patient Account Number: [id number] Date of Birth/Sex: June 19, 1941 (80 y.o. F) Treating RN: Donnamarie Poag Primary Care Mickenzie Stolar: Frazier Richards Other Clinician: Referring Kingson Lohmeyer: Frazier Richards Treating Ananya Mccleese/Extender: Skipper Cliche in Treatment: 8 Active Problems Location of Pain Severity and Description of Pain Patient Has Paino No Site Locations Rate the pain. Current Pain Level: 0 Pain Management and Medication Current Pain Management: Electronic Signature(s) Signed: 11/20/2021 4:42:40 PM By: Donnamarie Poag Entered By: Donnamarie Poag on 11/20/2021 16:08:15 [name]  ([id number]) -------------------------------------------------------------------------------- Patient/Caregiver Education Details Patient Name: [name]. Date of Service: 11/20/2021 3:15 PM Medical Record Number: [id number] Patient Account Number: [id number] Date of Birth/Gender: [birth year] (80 y.o. F) Treating RN: Donnamarie Poag Primary Care Physician: Frazier Richards Other Clinician: Referring Physician: Frazier Richards Treating Physician/Extender: Skipper Cliche in Treatment: 8 Education Assessment Education Provided To: Patient Education Topics Provided Basic Hygiene: Nutrition: Wound Debridement: Wound/Skin Impairment: Electronic Signature(s) Signed: 11/20/2021 4:42:40 PM By: Donnamarie Poag Entered By: Donnamarie Poag on 11/20/2021 16:33:07 [name] ([id number]) -------------------------------------------------------------------------------- Wound Assessment Details Patient Name: [name]. Date of Service: 11/20/2021 3:15 PM Medical Record Number: [id number] Patient Account Number: [id number] Date of Birth/Sex: 05/11/1941 (80 y.o. F) Treating RN: Donnamarie Poag Primary Care Orva Gwaltney: Frazier Richards Other Clinician: Referring Lukis Bunt: Frazier Richards Treating Diann Bangerter/Extender: Skipper Cliche in Treatment: 8 Wound Status Wound Number: 1 Primary Diabetic Wound/Ulcer of the Lower Extremity Etiology: Wound Location: Right, Lateral Malleolus Wound Open Wounding Event: Gradually Appeared Status: Date Acquired: 05/21/2021 Comorbid Chronic Obstructive Pulmonary Disease (COPD), Weeks Of Treatment: 8 History: Congestive Heart Failure, Type II Diabetes Clustered Wound: No Photos Wound Measurements Length: (cm) 0.2 Width: (cm) 0.3 Depth: (cm) 0.1 Area: (cm) 0.047 Volume: (cm) 0.005 % Reduction in Area: 70.1% % Reduction in Volume: 68.8% Epithelialization: None Tunneling: No Undermining: No Wound Description Classification: Grade  1 Exudate Amount: Medium Exudate Type: Serosanguineous Exudate Color: red, brown Foul Odor After Cleansing: No Slough/Fibrino Yes Wound Bed Granulation Amount: None Present (0%) Exposed Structure Necrotic Amount: Large (67-100%) Fascia  Exposed: No Necrotic Quality: Adherent Slough Fat Layer (Subcutaneous Tissue) Exposed: Yes Tendon Exposed: No Muscle Exposed: No Joint Exposed: No Bone Exposed: No Treatment Notes Wound #1 (Malleolus) Wound Laterality: Right, Lateral Cleanser Soap and Water Discharge Instruction: Gently cleanse wound with antibacterial soap, rinse and pat dry prior to dressing wounds Peri-Wound Care [name], [name] ([id number]) Topical Primary Dressing Hydrofera Blue Ready Transfer Foam, 2.5x2.5 (in/in) Discharge Instruction: CUt to fit wound-Apply Hydrofera Blue Ready to wound bed as directed Secondary Dressing Gauze Discharge Instruction: to cover blue Secured With 48M [place] Surgical Tape, 2x2 (in/yd) Kerlix Roll Sterile or Non-Sterile 6-ply 4.5x4 (yd/yd) Discharge Instruction: Apply Kerlix as directed to secure Compression Wrap Compression Stockings Add-Ons Electronic Signature(s) Signed: 11/20/2021 4:42:40 PM By: Donnamarie Poag Entered By: Donnamarie Poag on 11/20/2021 16:10:26 [name] ([id number]) -------------------------------------------------------------------------------- Wound Assessment Details Patient Name: [name]. Date of Service: 11/20/2021 3:15 PM Medical Record Number: [id number] Patient Account Number: [id number] Date of Birth/Sex: 05-05-1941 (80 y.o. F) Treating RN: Donnamarie Poag Primary Care Adonai Selsor: Frazier Richards Other Clinician: Referring Shigeko Manard: Frazier Richards Treating Makina Skow/Extender: Skipper Cliche in Treatment: 8 Wound Status Wound Number: 2 Primary Trauma, Other Etiology: Wound Location: Left Hand - Dorsum Wound Open Wounding Event: Trauma Status: Date Acquired:  10/13/2021 Comorbid Chronic Obstructive Pulmonary Disease (COPD), Weeks Of Treatment: 4 History: Congestive Heart Failure, Type II Diabetes Clustered Wound: No Photos Wound Measurements Length: (cm) 0.3 Width: (cm) 0.2 Depth: (cm) 0.1 Area: (cm) 0.047 Volume: (cm) 0.005 % Reduction in Area: 98.7% % Reduction in Volume: 98.6% Epithelialization: Medium (34-66%) Tunneling: No Undermining: No Wound Description Classification: Full Thickness Without Exposed Support Structures Exudate Amount: Medium Exudate Type: Serosanguineous Exudate Color: red, brown Foul Odor After Cleansing: No Slough/Fibrino Yes Wound Bed Granulation Amount: Large (67-100%) Exposed Structure Granulation Quality: Red, Pink Fascia Exposed: No Necrotic Amount: Small (1-33%) Fat Layer (Subcutaneous Tissue) Exposed: Yes Necrotic Quality: Adherent Slough Tendon Exposed: No Muscle Exposed: No Joint Exposed: No Bone Exposed: No Treatment Notes Wound #2 (Hand - Dorsum) Wound Laterality: Left Cleanser Soap and Water Discharge Instruction: Gently cleanse wound with antibacterial soap, rinse and pat dry prior to dressing wounds Peri-Wound Care [name], [name] ([id number]) Topical Primary Dressing oil emulsion Discharge Instruction: cover wound Secondary Dressing Coverlet Latex-Free Fabric Adhesive Dressings Discharge Instruction: 1.5 x 2 Secured With Compression Wrap Compression Stockings Add-Ons Electronic Signature(s) Signed: 11/20/2021 4:42:40 PM By: Donnamarie Poag Entered By: Donnamarie Poag on 11/20/2021 16:11:02 [name] ([id number]) -------------------------------------------------------------------------------- [place] Details Patient Name: [name]. Date of Service: 11/20/2021 3:15 PM Medical Record Number: [id number] Patient Account Number: [id number] Date of Birth/Sex: Jul 24, 1941 (80 y.o. F) Treating RN: Donnamarie Poag Primary Care Jesalyn Finazzo: Frazier Richards Other  Clinician: Referring Kyona Chauncey: Frazier Richards Treating Balian Schaller/Extender: Skipper Cliche in Treatment: 8 Vital Signs Time Taken: 16:03 Temperature (F): 98.3 Height (in): 65 Pulse (bpm): 70 Weight (lbs): 98 Respiratory Rate (breaths/min): 16 Body Mass Index (BMI): 16.3 Blood Pressure (mmHg): 101/37 Reference Range: 80 - 120 mg / dl Notes PO non caffeine fluid encouraged; stated only coffee and tea+16 oz all day Electronic Signature(s) Signed: 11/20/2021 4:42:40 PM By: Donnamarie Poag Entered ByDonnamarie Poag on 11/20/2021 16:04:54

## 2021-11-27 ENCOUNTER — Encounter: Payer: PPO | Admitting: Physician Assistant

## 2021-11-27 ENCOUNTER — Other Ambulatory Visit: Payer: Self-pay

## 2021-11-27 DIAGNOSIS — E11622 Type 2 diabetes mellitus with other skin ulcer: Secondary | ICD-10-CM | POA: Diagnosis not present

## 2021-11-27 DIAGNOSIS — L97312 Non-pressure chronic ulcer of right ankle with fat layer exposed: Secondary | ICD-10-CM | POA: Diagnosis not present

## 2021-11-27 NOTE — Progress Notes (Addendum)
[name], [name] ([id number]) Visit Report for 11/27/2021 Chief Complaint Document Details Patient Name: [name], [name]. Date of Service: 11/27/2021 3:45 PM Medical Record Number: [id number] Patient Account Number: [id number] Date of Birth/Sex: 1941-01-12 (80 y.o. F) Treating RN: Cornell Barman Primary Care Provider: Frazier Richards Other Clinician: Referring Provider: Frazier Richards Treating Provider/Extender: Skipper Cliche in Treatment: 9 Information Obtained from: Patient Chief Complaint Right lateral ankle ulcer and left hand skin tear Electronic Signature(s) Signed: 11/27/2021 4:16:37 PM By: Worthy Keeler PA-C Entered By: Worthy Keeler on 11/27/2021 16:16:37 [name] ([id number]) -------------------------------------------------------------------------------- Debridement Details Patient Name: [name]. Date of Service: 11/27/2021 3:45 PM Medical Record Number: [id number] Patient Account Number: [id number] Date of Birth/Sex: 1941-10-14 (80 y.o. F) Treating RN: Cornell Barman Primary Care Provider: Frazier Richards Other Clinician: Referring Provider: Frazier Richards Treating Provider/Extender: Skipper Cliche in Treatment: 9 Debridement Performed for Wound #1 Right,Lateral Malleolus Assessment: Performed By: Physician Tommie Sams., PA-C Debridement Type: Debridement Severity of Tissue Pre Debridement: Fat layer exposed Level of Consciousness (Pre- Awake and Alert procedure): Pre-procedure Verification/Time Out Yes - 16:20 Taken: Total Area Debrided (L x W): 0.2 (cm) x 0.3 (cm) = 0.06 (cm) Tissue and other material Non-Viable, Slough, Subcutaneous, Slough debrided: Level: Skin/Subcutaneous Tissue Debridement Description: Excisional Instrument: Curette Bleeding: Minimum Hemostasis Achieved: Pressure Response to Treatment: Procedure was tolerated well Level of Consciousness (Post- Awake and Alert procedure): Post Debridement  Measurements of Total Wound Length: (cm) 0.2 Width: (cm) 0.3 Depth: (cm) 0.1 Volume: (cm) 0.005 Character of Wound/Ulcer Post Debridement: Stable Severity of Tissue Post Debridement: Fat layer exposed Post Procedure Diagnosis Same as Pre-procedure Electronic Signature(s) Signed: 11/27/2021 5:14:38 PM By: Worthy Keeler PA-C Signed: 11/28/2021 9:26:19 AM By: Gretta Cool, BSN, RN, CWS, Kim RN, BSN Entered By: Gretta Cool, BSN, RN, CWS, Kim on 11/27/2021 16:21:31 [name] ([id number]) -------------------------------------------------------------------------------- HPI Details Patient Name: [name]. Date of Service: 11/27/2021 3:45 PM Medical Record Number: [id number] Patient Account Number: [id number] Date of Birth/Sex: May 27, 1941 (80 y.o. F) Treating RN: Cornell Barman Primary Care Provider: Frazier Richards Other Clinician: Referring Provider: Frazier Richards Treating Provider/Extender: Skipper Cliche in Treatment: 9 History of Present Illness HPI Description: 09/21/2021 upon evaluation today patient appears to be doing somewhat poorly in regard to her wound on her right lateral ankle. She has been tolerating the dressing changes without complication. Fortunately there does not not appear to be any signs of active infection systemically at this time which is great news. Likely also do not see any major issues which is good news. Nonetheless the biggest problem is that even with attempting to treat this and try to get things under control on her own she is really not been able to get this healed. She does tend to sleep on her right side exclusively due to the fact that she is not able to lay any other way due to pain. She has issues with her right hip. She is a diabetic her last hemoglobin A1c was 5.6 however this should not be affecting her ability to heal she also had an MRI in July which was negative for osteomyelitis. Overall everything checks out okay but nonetheless she  is still experiencing the open wound that she just cannot get closed. She does wear oxygen 24/7. She has a history of congestive heart failure, COPD, and again the diabetes mellitus type 2. 09/29/2021 upon evaluation today patient appears to be doing roughly the same medial movement better as far as some of  the necrotic tissue on the surface of the wound is concerned it is kind of gently coming off little by little. With that being said I do not see any signs of active infection systemically at this point which is good news. 10/06/2021 upon evaluation today patient appears to be doing excellent in regard to her wound from an infection standpoint I do not see any issues here currently. Unfortunately she continues develop slough over the surface of the wound where using collagen I am not certain that skin to be the best way to go going forward. I really think she may do well with Iodoflex. 10/13/2021 upon evaluation today patient appears to be doing well with regard to her wound. I actually feel like this is showing signs of improvement with regard to the overall appearance I do believe the Iodoflex has been helpful. 10/20/2021 upon evaluation today patient appears to be doing a little better in regard to her ankle ulcer. With that being said she has a new wound on her hand as well due to skin tear that occurred on the way home last week after I saw her. Fortunately there does not appear to be any evidence of active infection at this time. No fevers, chills, nausea, vomiting, or diarrhea. 10/27/2021 upon evaluation today 10/27/2021 upon evaluation today patient appears to be doing better in regard to the wound on the hand as well as the ankle both are showing signs of being smaller which is great news. I do not see any signs of infection which is also great news. In general I think that we are headed in the appropriate direction. 11/03/21 upon evaluation today patient appears to be doing well with regard  to her wounds. She has been tolerating the dressing changes without complication. Fortunately there does not appear to be any signs of active infection at this time which is great news. No fevers, chills, nausea, vomiting, or diarrhea. 11/20/2021 upon evaluation today patient appears to be doing decently well in regard to her wounds. This is good news. Fortunately the hand is almost completely healed and the foot is smaller although this week it is about the same compared to what we saw previous. I do not see any evidence of infection there is little bit of dry skin we will continue to remove this is still taking a little bit longer than what I would expect to heal this makes me concerned about the fact of possibly needing to look into a more formal arterial study with regard to her arterial flow to see what they stand. 11/27/2021 upon evaluation today patient appears to be doing well with regard to her wound although is not significantly smaller is also not any bigger which is good news. Electronic Signature(s) Signed: 11/27/2021 4:36:06 PM By: Worthy Keeler PA-C Entered By: Worthy Keeler on 11/27/2021 16:36:06 [name] ([id number]) -------------------------------------------------------------------------------- Physical Exam Details Patient Name: [name]. Date of Service: 11/27/2021 3:45 PM Medical Record Number: [id number] Patient Account Number: [id number] Date of Birth/Sex: 05-19-1941 (80 y.o. F) Treating RN: Cornell Barman Primary Care Provider: Frazier Richards Other Clinician: Referring Provider: Frazier Richards Treating Provider/Extender: Skipper Cliche in Treatment: 9 Constitutional Well-nourished and well-hydrated in no acute distress. Respiratory normal breathing without difficulty. Psychiatric this patient is able to make decisions and demonstrates good insight into disease process. Alert and Oriented x 3. pleasant and cooperative. Notes Upon  inspection patient's wound bed actually showed signs of good granulation and epithelization at this point. Fortunately  I do not see any evidence of active infection locally nor systemically at this point which is great news. With that being said I did have to perform a little bit of debridement to clear away some of the eschar over the surface of the wound. She was doing better prior to the switch to [facility] in some ways I think that we will go back to the Xeroform this was a suggestion of her family member as well. Electronic Signature(s) Signed: 11/27/2021 4:37:02 PM By: Worthy Keeler PA-C Entered By: Worthy Keeler on 11/27/2021 16:37:02 [name] ([id number]) -------------------------------------------------------------------------------- Physician Orders Details Patient Name: [name]. Date of Service: 11/27/2021 3:45 PM Medical Record Number: [id number] Patient Account Number: [id number] Date of Birth/Sex: 1941/08/28 (80 y.o. F) Treating RN: Cornell Barman Primary Care Provider: Frazier Richards Other Clinician: Referring Provider: Frazier Richards Treating Provider/Extender: Skipper Cliche in Treatment: 9 Verbal / Phone Orders: No Diagnosis Coding ICD-10 Coding Code Description E11.622 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 S61.412A Laceration without foreign body of left hand, initial encounter J44.9 Chronic obstructive pulmonary disease, unspecified I50.42 Chronic combined systolic (congestive) and diastolic (congestive) heart failure Z99.81 Dependence on supplemental oxygen Follow-up Appointments o Return Appointment in 1 week. o Nurse Visit as needed Bathing/ Shower/ Hygiene o May shower; gently cleanse wound with antibacterial soap, rinse and pat dry prior to dressing wounds o No tub bath. Edema Control - Lymphedema / Segmental Compressive Device /  Other o Elevate leg(s) parallel to the floor when sitting. Additional Orders / Instructions o Follow Nutritious Diet and Increase Protein Intake o Other: - Drink more non caffeine beverages to keep Blood pressure normal Oxygen Administration o Pulse Oxymetry measurement in clinic - PRN o While patient is in clinic, provide supplemental oxygen via nasal cannula Wound Treatment Wound #1 - Malleolus Wound Laterality: Right, Lateral Cleanser: Soap and Water 1 x Per Day/30 Days Discharge Instructions: Gently cleanse wound with antibacterial soap, rinse and pat dry prior to dressing wounds Primary Dressing: Xeroform-HBD 2x2 (in/in) (DME) (Generic) 1 x Per Day/30 Days Discharge Instructions: Apply Xeroform-HBD 2x2 (in/in) as directed Secondary Dressing: Zetuvit Plus Silicone Border Dressing 4x4 (in/in) (DME) (Generic) 1 x Per Day/30 Days Electronic Signature(s) Signed: 11/27/2021 5:14:38 PM By: Worthy Keeler PA-C Signed: 11/28/2021 9:26:19 AM By: Gretta Cool, BSN, RN, CWS, Kim RN, BSN Entered By: Gretta Cool, BSN, RN, CWS, Kim on 11/27/2021 16:29:12 [name] ([id number]) -------------------------------------------------------------------------------- Problem List Details Patient Name: [name]. Date of Service: 11/27/2021 3:45 PM Medical Record Number: [id number] Patient Account Number: [id number] Date of Birth/Sex: April 30, 1941 (80 y.o. F) Treating RN: Cornell Barman Primary Care Provider: Frazier Richards Other Clinician: Referring Provider: Frazier Richards Treating Provider/Extender: Skipper Cliche in Treatment: 9 Active Problems ICD-10 Encounter Code Description Active Date MDM Diagnosis E11.622 Type 2 diabetes mellitus with other skin ulcer 09/21/2021 No Yes L97.312 Non-pressure chronic ulcer of right ankle with fat layer exposed 09/21/2021 No Yes S61.412A Laceration without foreign body of left hand, initial encounter 10/20/2021 No Yes J44.9 Chronic obstructive  pulmonary disease, unspecified 09/21/2021 No Yes I50.42 Chronic combined systolic (congestive) and diastolic (congestive) heart 09/21/2021 No Yes failure Z99.81 Dependence on supplemental oxygen 09/21/2021 No Yes Inactive Problems Resolved Problems Electronic Signature(s) Signed: 11/27/2021 4:16:31 PM By: Worthy Keeler PA-C Entered By: Worthy Keeler on 11/27/2021 16:16:30 [name] ([id number]) -------------------------------------------------------------------------------- Progress Note Details Patient Name: [name]. Date  of Service: 11/27/2021 3:45 PM Medical Record Number: [id number] Patient Account Number: [id number] Date of Birth/Sex: [birth year] (80 y.o. F) Treating RN: Cornell Barman Primary Care Provider: Frazier Richards Other Clinician: Referring Provider: Frazier Richards Treating Provider/Extender: Skipper Cliche in Treatment: 9 Subjective Chief Complaint Information obtained from Patient Right lateral ankle ulcer and left hand skin tear History of Present Illness (HPI) 09/21/2021 upon evaluation today patient appears to be doing somewhat poorly in regard to her wound on her right lateral ankle. She has been tolerating the dressing changes without complication. Fortunately there does not not appear to be any signs of active infection systemically at this time which is great news. Likely also do not see any major issues which is good news. Nonetheless the biggest problem is that even with attempting to treat this and try to get things under control on her own she is really not been able to get this healed. She does tend to sleep on her right side exclusively due to the fact that she is not able to lay any other way due to pain. She has issues with her right hip. She is a diabetic her last hemoglobin A1c was 5.6 however this should not be affecting her ability to heal she also had an MRI in July which was negative for osteomyelitis. Overall everything  checks out okay but nonetheless she is still experiencing the open wound that she just cannot get closed. She does wear oxygen 24/7. She has a history of congestive heart failure, COPD, and again the diabetes mellitus type 2. 09/29/2021 upon evaluation today patient appears to be doing roughly the same medial movement better as far as some of the necrotic tissue on the surface of the wound is concerned it is kind of gently coming off little by little. With that being said I do not see any signs of active infection systemically at this point which is good news. 10/06/2021 upon evaluation today patient appears to be doing excellent in regard to her wound from an infection standpoint I do not see any issues here currently. Unfortunately she continues develop slough over the surface of the wound where using collagen I am not certain that skin to be the best way to go going forward. I really think she may do well with Iodoflex. 10/13/2021 upon evaluation today patient appears to be doing well with regard to her wound. I actually feel like this is showing signs of improvement with regard to the overall appearance I do believe the Iodoflex has been helpful. 10/20/2021 upon evaluation today patient appears to be doing a little better in regard to her ankle ulcer. With that being said she has a new wound on her hand as well due to skin tear that occurred on the way home last week after I saw her. Fortunately there does not appear to be any evidence of active infection at this time. No fevers, chills, nausea, vomiting, or diarrhea. 10/27/2021 upon evaluation today 10/27/2021 upon evaluation today patient appears to be doing better in regard to the wound on the hand as well as the ankle both are showing signs of being smaller which is great news. I do not see any signs of infection which is also great news. In general I think that we are headed in the appropriate direction. 11/03/21 upon evaluation today patient  appears to be doing well with regard to her wounds. She has been tolerating the dressing changes without complication. Fortunately there does not appear to be any signs  of active infection at this time which is great news. No fevers, chills, nausea, vomiting, or diarrhea. 11/20/2021 upon evaluation today patient appears to be doing decently well in regard to her wounds. This is good news. Fortunately the hand is almost completely healed and the foot is smaller although this week it is about the same compared to what we saw previous. I do not see any evidence of infection there is little bit of dry skin we will continue to remove this is still taking a little bit longer than what I would expect to heal this makes me concerned about the fact of possibly needing to look into a more formal arterial study with regard to her arterial flow to see what they stand. 11/27/2021 upon evaluation today patient appears to be doing well with regard to her wound although is not significantly smaller is also not any bigger which is good news. Objective Constitutional Well-nourished and well-hydrated in no acute distress. Vitals Time Taken: 3:53 PM, Height: 65 in, Weight: 98 lbs, BMI: 16.3, Temperature: 98.2 F, Pulse: 78 bpm, Respiratory Rate: 16 breaths/min, Blood Pressure: 114/55 mmHg. [name], [name] ([id number]) Respiratory normal breathing without difficulty. Psychiatric this patient is able to make decisions and demonstrates good insight into disease process. Alert and Oriented x 3. pleasant and cooperative. General Notes: Upon inspection patient's wound bed actually showed signs of good granulation and epithelization at this point. Fortunately I do not see any evidence of active infection locally nor systemically at this point which is great news. With that being said I did have to perform a little bit of debridement to clear away some of the eschar over the surface of the wound. She was doing better  prior to the switch to [facility] in some ways I think that we will go back to the Xeroform this was a suggestion of her family member as well. Integumentary (Hair, Skin) Wound #1 status is Open. Original cause of wound was Gradually Appeared. The date acquired was: 05/21/2021. The wound has been in treatment 9 weeks. The wound is located on the Right,Lateral Malleolus. The wound measures 0.2cm length x 0.3cm width x 0.1cm depth; 0.047cm^2 area and 0.005cm^3 volume. There is Fat Layer (Subcutaneous Tissue) exposed. There is no tunneling or undermining noted. There is a medium amount of serosanguineous drainage noted. There is no granulation within the wound bed. There is a large (67-100%) amount of necrotic tissue within the wound bed including Adherent Slough. Wound #2 status is Healed - Epithelialized. Original cause of wound was Trauma. The date acquired was: 10/13/2021. The wound has been in treatment 5 weeks. The wound is located on the Left Hand - Dorsum. The wound measures 0cm length x 0cm width x 0cm depth; 0cm^2 area and 0cm^3 volume. There is a medium amount of serosanguineous drainage noted. 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 There was a Excisional Skin/Subcutaneous Tissue Debridement with a total area of 0.06 sq cm performed by Tommie Sams., PA-C. With the following instrument(s): Curette to remove Non-Viable tissue/material. Material removed  includes Subcutaneous Tissue and  Slough and. No specimens were taken. A time out was conducted at 16:20, prior to the start of the procedure. A Minimum amount of bleeding was controlled with Pressure. The procedure was tolerated well. Post Debridement Measurements: 0.2cm length x 0.3cm width x 0.1cm depth; 0.005cm^3 volume. Character of Wound/Ulcer Post Debridement is stable. Severity of Tissue Post Debridement is: Fat layer exposed. Post procedure Diagnosis Wound #1: Same as Pre-Procedure Plan Follow-up Appointments: Return Appointment in 1 week. Nurse Visit as needed Bathing/ Shower/ Hygiene: May shower; gently cleanse wound with antibacterial soap, rinse and pat dry prior to dressing wounds No tub bath. Edema Control - Lymphedema / Segmental Compressive Device / Other: Elevate leg(s) parallel to the floor when sitting. Additional Orders / Instructions: Follow Nutritious Diet and Increase Protein Intake Other: - Drink more non caffeine beverages to keep Blood pressure normal Oxygen Administration: Pulse Oxymetry measurement in clinic - PRN [name], [name] ([id number]) While patient is in clinic, provide supplemental oxygen via nasal cannula WOUND #1: - Malleolus Wound Laterality: Right, Lateral Cleanser: Soap and Water 1 x Per Day/30 Days Discharge Instructions: Gently cleanse wound with antibacterial soap, rinse and pat dry prior to dressing wounds Primary Dressing: Xeroform-HBD 2x2 (in/in) (DME) (Generic) 1 x Per Day/30 Days Discharge Instructions: Apply Xeroform-HBD 2x2 (in/in) as directed Secondary Dressing: Zetuvit Plus Silicone Border Dressing 4x4 (in/in) (DME) (Generic) 1 x Per Day/30 Days 1. Would recommend currently that we going continue with the wound care measures as before we will going get patient set up for the switch back to the Xeroform which did do well for her in the past. 2. I am also can recommend that we continue with the border foam dressing to cover. 3. I  am also can recommend the patient continue to monitor for any signs of worsening or infection if anything changes she should let me know soon as possible. We will see patient back for reevaluation in 1 week here in the clinic. If anything worsens or changes patient will contact our office for additional recommendations. Electronic Signature(s) Signed: 11/27/2021 4:37:33 PM By: Worthy Keeler PA-C Entered By: Worthy Keeler on 11/27/2021 16:37:32 [name] ([id number]) -------------------------------------------------------------------------------- SuperBill Details Patient Name: [name]. Date of Service: 11/27/2021 Medical Record Number: [id number] Patient Account Number: [id number] Date of Birth/Sex: 05/21/1941 (80 y.o. F) Treating RN: Cornell Barman Primary Care Provider: Frazier Richards Other Clinician: Referring Provider: Frazier Richards Treating Provider/Extender: Skipper Cliche in Treatment: 9 Diagnosis Coding ICD-10 Codes Code Description E11.622 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 S61.412A Laceration without foreign body of left hand, initial encounter J44.9 Chronic obstructive pulmonary disease, unspecified I50.42 Chronic combined systolic (congestive) and diastolic (congestive) heart failure Z99.81 Dependence on supplemental oxygen Facility Procedures CPT4 Code: [id number] Description: 22633 - DEB SUBQ TISSUE 20 SQ CM/< Modifier: Quantity: 1 CPT4 Code: Description: ICD-10 Diagnosis Description L97.312 Non-pressure chronic ulcer of right ankle with fat layer exposed Modifier: Quantity: Physician Procedures CPT4 Code: 3545625 Description: 11042 - WC PHYS SUBQ TISS 20 SQ CM Modifier: Quantity: 1 CPT4 Code: Description: ICD-10 Diagnosis Description W38.937 Non-pressure chronic ulcer of right ankle with fat layer exposed Modifier: Quantity: Electronic  Signature(s) Signed: 11/27/2021 4:37:45 PM By: Worthy Keeler PA-C Entered By: Worthy Keeler on 11/27/2021 16:37:44

## 2021-11-28 NOTE — Progress Notes (Signed)
Jasmine, Montoya ([id number]) Visit Report for 11/27/2021 Arrival Information Details Patient Name: [name], [name]. Date of Service: 11/27/2021 3:45 PM Medical Record Number: [id number] Patient Account Number: [id number] Date of Birth/Sex: 1941/03/11 (80 y.o. F) Treating RN: Cornell Barman Primary Care Helayne Metsker: Frazier Richards Other Clinician: Referring Terez Freimark: Frazier Richards Treating Tiaunna Buford/Extender: Skipper Cliche in Treatment: 9 Visit Information History Since Last Visit Added or deleted any medications: No Patient Arrived: Wheel Chair Has Dressing in Place as Prescribed: Yes Arrival Time: 15:53 Pain Present Now: Yes Accompanied By: grandaughter Transfer Assistance: Manual Patient Identification Verified: Yes Secondary Verification Process Completed: Yes Patient Requires Transmission-Based Precautions: No Patient Has Alerts: No Electronic Signature(s) Signed: 11/28/2021 9:26:19 AM By: Gretta Cool, BSN, RN, CWS, Kim RN, BSN Entered By: Gretta Cool, BSN, RN, CWS, Kim on 11/27/2021 15:53:49 [name] ([id number]) -------------------------------------------------------------------------------- Encounter Discharge Information Details Patient Name: [name]. Date of Service: 11/27/2021 3:45 PM Medical Record Number: [id number] Patient Account Number: [id number] Date of Birth/Sex: 1941/01/27 (80 y.o. F) Treating RN: Cornell Barman Primary Care Kylieann Eagles: Frazier Richards Other Clinician: Referring Miyeko Mahlum: Frazier Richards Treating Etha Stambaugh/Extender: Skipper Cliche in Treatment: 9 Encounter Discharge Information Items Post Procedure Vitals Discharge Condition: Stable Temperature (F): 98.1 Ambulatory Status: Ambulatory Pulse (bpm): 78 Discharge Destination: Home Respiratory Rate (breaths/min): 16 Transportation: Private Auto Blood Pressure (mmHg): 114/55 Accompanied By: grandaughter Schedule Follow-up Appointment: Yes Clinical Summary of  Care: Electronic Signature(s) Signed: 11/28/2021 9:26:19 AM By: Gretta Cool, BSN, RN, CWS, Kim RN, BSN Entered By: Gretta Cool, BSN, RN, CWS, Kim on 11/27/2021 16:30:52 [name] ([id number]) -------------------------------------------------------------------------------- Lower Extremity Assessment Details Patient Name: [name]. Date of Service: 11/27/2021 3:45 PM Medical Record Number: [id number] Patient Account Number: [id number] Date of Birth/Sex: 26-Sep-1941 (80 y.o. F) Treating RN: Cornell Barman Primary Care Shi Blankenship: Frazier Richards Other Clinician: Referring Riccardo Holeman: Frazier Richards Treating Camar Guyton/Extender: Skipper Cliche in Treatment: 9 Edema Assessment Assessed: [Left: No] [Right: Yes] Edema: [Left: N] [Right: o] Vascular Assessment Pulses: Dorsalis Pedis Palpable: [Right:Yes] Electronic Signature(s) Signed: 11/28/2021 9:26:19 AM By: Gretta Cool, BSN, RN, CWS, Kim RN, BSN Entered By: Gretta Cool, BSN, RN, CWS, Kim on 11/27/2021 16:02:16 [name] ([id number]) -------------------------------------------------------------------------------- Multi Wound Chart Details Patient Name: [name]. Date of Service: 11/27/2021 3:45 PM Medical Record Number: [id number] Patient Account Number: [id number] Date of Birth/Sex: Sep 25, 1941 (80 y.o. 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, Other N/A Extremity Comorbid History: Chronic Obstructive  Pulmonary N/A N/A Disease (COPD), Congestive Heart Failure, Type II Diabetes Date Acquired: 05/21/2021 10/13/2021 N/A Weeks of Treatment: 9 5 N/A Wound Status: Open Healed - Epithelialized N/A Measurements L x W x D (cm) 0.2x0.3x0.1 0x0x0 N/A Area (cm) : 0.047 0 N/A Volume (cm) : 0.005 0 N/A % Reduction in Area: 70.10% 100.00% N/A % Reduction in Volume: 68.80% 100.00% N/A Classification: Grade 1 Full Thickness Without Exposed N/A Support Structures Exudate Amount: Medium Medium N/A Exudate Type: Serosanguineous Serosanguineous N/A Exudate Color: red, brown red, brown N/A Granulation Amount: None Present (0%) N/A N/A Necrotic Amount: Large (67-100%) N/A N/A Exposed Structures: Fat Layer (Subcutaneous Tissue): N/A N/A Yes Fascia: No Tendon: No Muscle: No  Joint: No Bone: No Epithelialization: None N/A N/A Treatment Notes Electronic Signature(s) Signed: 11/28/2021 9:26:19 AM By: Gretta Cool, BSN, RN, CWS, Kim RN, BSN Entered By: Gretta Cool, BSN, RN, CWS, Kim on 11/27/2021 16:02:44 [name] ([id number]) -------------------------------------------------------------------------------- [place] Details Patient Name: [name]. Date of Service: 11/27/2021 3:45 PM Medical Record Number: [id number] Patient Account Number: [id number] Date of Birth/Sex: 1941/12/29 (80 y.o. F) Treating RN: Cornell Barman Primary Care Tabathia Knoche: Frazier Richards Other Clinician: Referring Bowen Kia: Frazier Richards Treating Kennadee Walthour/Extender: Skipper Cliche in Treatment: 9 Active Inactive Necrotic Tissue Nursing Diagnoses: Impaired tissue integrity related to necrotic/devitalized tissue Goals: Necrotic/devitalized tissue will be minimized in the wound bed Date Initiated: 09/21/2021 Target Resolution Date: 09/21/2021 Goal Status: Active Patient/caregiver will verbalize understanding of reason and process for debridement of necrotic tissue Date Initiated: 09/21/2021 Date  Inactivated: 10/27/2021 Target Resolution Date: 09/21/2021 Goal Status: Met Interventions: Assess patient pain level pre-, during and post procedure and prior to discharge Provide education on necrotic tissue and debridement process Treatment Activities: Apply topical anesthetic as ordered : 09/21/2021 Biologic debridement : 09/21/2021 Enzymatic debridement : 09/21/2021 Excisional debridement : 09/21/2021 Notes: Wound/Skin Impairment Nursing Diagnoses: Impaired tissue integrity Goals: Patient/caregiver will verbalize understanding of skin care regimen Date Initiated: 09/21/2021 Date Inactivated: 10/27/2021 Target Resolution Date: 09/21/2021 Goal Status: Met Ulcer/skin breakdown will have a volume reduction of 30% by week 4 Date Initiated: 09/21/2021 Date Inactivated: 10/27/2021 Target Resolution Date: 10/21/2021 Goal Status: Met Ulcer/skin breakdown will have a volume reduction of 50% by week 8 Date Initiated: 09/21/2021 Target Resolution Date: 11/21/2021 Goal Status: Active Ulcer/skin breakdown will have a volume reduction of 80% by week 12 Date Initiated: 09/21/2021 Target Resolution Date: 12/21/2021 Goal Status: Active Ulcer/skin breakdown will heal within 14 weeks Date Initiated: 09/21/2021 Target Resolution Date: 01/21/2022 Goal Status: Active Interventions: Assess patient/caregiver ability to obtain necessary supplies Assess patient/caregiver ability to perform ulcer/skin care regimen upon admission and as needed Assess ulceration(s) every visit Provide education on ulcer and skin care KINDRA, BICKHAM ([id number]) Treatment Activities: Referred to DME Everline Mahaffy for dressing supplies : 09/21/2021 Skin care regimen initiated : 09/21/2021 Notes: Electronic Signature(s) Signed: 11/28/2021 9:26:19 AM By: Gretta Cool, BSN, RN, CWS, Kim RN, BSN Entered By: Gretta Cool, BSN, RN, CWS, Kim on 11/27/2021 16:29:18 [name]  ([id number]) -------------------------------------------------------------------------------- Pain Assessment Details Patient Name: [name]. Date of Service: 11/27/2021 3:45 PM Medical Record Number: [id number] Patient Account Number: [id number] Date of Birth/Sex: 1941/10/27 (80 y.o. F) Treating RN: Cornell Barman Primary Care Raegan Sipp: Frazier Richards Other Clinician: Referring Greidy Sherard: Frazier Richards Treating Cortland Crehan/Extender: Skipper Cliche in Treatment: 9 Active Problems Location of Pain Severity and Description of Pain Patient Has Paino No Site Locations Pain Management and Medication Current Pain Management: Notes Patient denies pain at this time. Electronic Signature(s) Signed: 11/28/2021 9:26:19 AM By: Gretta Cool, BSN, RN, CWS, Kim RN, BSN Entered By: Gretta Cool, BSN, RN, CWS, Kim on 11/27/2021 15:56:25 [name] ([id number]) -------------------------------------------------------------------------------- Patient/Caregiver Education Details Patient Name: [name]. Date of Service: 11/27/2021 3:45 PM Medical Record Number: [id number] Patient Account Number: [id number] Date of Birth/Gender: Feb 08, 1941 (80 y.o. F) Treating RN: Cornell Barman Primary Care Physician: Frazier Richards Other Clinician: Referring Physician: Frazier Richards Treating Physician/Extender: Skipper Cliche in Treatment: 9 Education Assessment Education Provided To: Patient Education Topics Provided Wound Debridement: Handouts: Wound Debridement Methods: Demonstration, Explain/Verbal Responses: State content correctly Electronic Signature(s) Signed: 11/28/2021 9:26:19 AM By: Gretta Cool, BSN, RN, CWS, Kim RN, BSN Entered By: Gretta Cool, BSN, RN, CWS, Kim  on 11/27/2021 16:29:46 MYLISSA, LAMBE ([id number]) -------------------------------------------------------------------------------- Wound Assessment Details Patient Name: KEORA, ECCLESTON. Date of Service: 11/27/2021 3:45  PM Medical Record Number: [id number] Patient Account Number: [id number] Date of Birth/Sex: 11-14-1941 (80 y.o. F) Treating RN: Cornell Barman Primary Care Abdel Effinger: Frazier Richards Other Clinician: Referring Jerick Khachatryan: Frazier Richards Treating Feleica Fulmore/Extender: Skipper Cliche in Treatment: 9 Wound Status Wound Number: 1 Primary Diabetic Wound/Ulcer of the Lower Extremity Etiology: Wound Location: Right, Lateral Malleolus Wound Open Wounding Event: Gradually Appeared Status: Date Acquired: 05/21/2021 Comorbid Chronic Obstructive Pulmonary Disease (COPD), Weeks Of Treatment: 9 History: Congestive Heart Failure, Type II Diabetes Clustered Wound: No Photos Wound Measurements Length: (cm) 0.2 Width: (cm) 0.3 Depth: (cm) 0.1 Area: (cm) 0.047 Volume: (cm) 0.005 % Reduction in Area: 70.1% % Reduction in Volume: 68.8% Epithelialization: None Tunneling: No Undermining: No Wound Description Classification: Grade 1 Exudate Amount: Medium Exudate Type: Serosanguineous Exudate Color: red, brown Foul Odor After Cleansing: No Slough/Fibrino Yes Wound Bed Granulation Amount: None Present (0%) Exposed Structure Necrotic Amount: Large (67-100%) Fascia Exposed: No Necrotic Quality: Adherent Slough Fat Layer (Subcutaneous Tissue) Exposed: Yes Tendon Exposed: No Muscle Exposed: No Joint Exposed: No Bone Exposed: No Treatment Notes Wound #1 (Malleolus) Wound Laterality: Right, Lateral Cleanser Soap and Water Discharge Instruction: Gently cleanse wound with antibacterial soap, rinse and pat dry prior to dressing wounds Peri-Wound Care KEYASHA, MIAH ([id number]) Topical Primary Dressing Xeroform-HBD 2x2 (in/in) Discharge Instruction: Apply Xeroform-HBD 2x2 (in/in) as directed Secondary Dressing Zetuvit Plus Silicone Border Dressing 4x4 (in/in) Secured With Compression Wrap Compression Stockings Environmental education officer) Signed: 11/28/2021 9:26:19 AM By: Gretta Cool,  BSN, RN, CWS, Kim RN, BSN Entered By: Gretta Cool, BSN, RN, CWS, Kim on 11/27/2021 04:88:89 [name] ([id number]) -------------------------------------------------------------------------------- Wound Assessment Details Patient Name: [name]. Date of Service: 11/27/2021 3:45 PM Medical Record Number: [id number] Patient Account Number: [id number] Date of Birth/Sex: [birth year] (80 y.o. F) Treating RN: Cornell Barman Primary Care Armando Bukhari: Frazier Richards Other Clinician: Referring Carleen Rhue: Frazier Richards Treating Bryann Mcnealy/Extender: Skipper Cliche in Treatment: 9 Wound Status Wound Number: 2 Primary Etiology: Trauma, Other Wound Location: Left Hand - Dorsum Wound Status: Healed - Epithelialized Wounding Event: Trauma Date Acquired: 10/13/2021 Weeks Of Treatment: 5 Clustered Wound: No Wound Measurements Length: (cm) 0 Width: (cm) 0 Depth: (cm) 0 Area: (cm) Volume: (cm) % Reduction in Area: 100% % Reduction in Volume: 100% 0 0 Wound Description Classification: Full Thickness Without Exposed Support Structu Exudate Amount: Medium Exudate Type: Serosanguineous Exudate Color: red, brown res Treatment Notes Wound #2 (Hand - Dorsum) Wound Laterality: Left Cleanser Peri-Wound Care Topical Primary Dressing Secondary Dressing Secured With Compression Wrap Compression Stockings Add-Ons Electronic Signature(s) Signed: 11/28/2021 9:26:19 AM By: Gretta Cool, BSN, RN, CWS, Kim RN, BSN Entered By: Gretta Cool, BSN, RN, CWS, Kim on 11/27/2021 16:01:51 [name] ([id number]) -------------------------------------------------------------------------------- [place] Details Patient Name: [name]. Date of Service: 11/27/2021 3:45 PM Medical Record Number: [id number] Patient Account Number: [id number] Date of Birth/Sex: July 22, 1941 (80 y.o. F) Treating RN: Cornell Barman Primary Care Eytan Carrigan: Frazier Richards Other Clinician: Referring Jerni Selmer: Frazier Richards Treating Ronnesha Mester/Extender: Skipper Cliche in Treatment: 9 Vital Signs Time Taken: 15:53 Temperature (F): 98.2 Height (in): 65 Pulse (bpm): 78 Weight (lbs): 98 Respiratory Rate (breaths/min): 16 Body Mass Index (BMI): 16.3 Blood Pressure (mmHg): 114/55 Reference Range: 80 - 120 mg / dl Electronic Signature(s) Signed: 11/28/2021 9:26:19 AM By: Gretta Cool, BSN, RN, CWS, Kim RN, BSN Entered By: Gretta Cool, BSN, RN, CWS, Kim on 11/27/2021 15:55:55

## 2021-11-29 ENCOUNTER — Other Ambulatory Visit (INDEPENDENT_AMBULATORY_CARE_PROVIDER_SITE_OTHER): Payer: Self-pay | Admitting: Physician Assistant

## 2021-11-29 DIAGNOSIS — L97312 Non-pressure chronic ulcer of right ankle with fat layer exposed: Secondary | ICD-10-CM

## 2021-11-30 ENCOUNTER — Other Ambulatory Visit: Payer: Self-pay

## 2021-11-30 ENCOUNTER — Ambulatory Visit (INDEPENDENT_AMBULATORY_CARE_PROVIDER_SITE_OTHER): Payer: PPO

## 2021-11-30 DIAGNOSIS — L97312 Non-pressure chronic ulcer of right ankle with fat layer exposed: Secondary | ICD-10-CM | POA: Diagnosis not present

## 2021-12-01 DIAGNOSIS — E11622 Type 2 diabetes mellitus with other skin ulcer: Secondary | ICD-10-CM | POA: Diagnosis not present

## 2021-12-05 ENCOUNTER — Encounter: Payer: PPO | Attending: Physician Assistant | Admitting: Physician Assistant

## 2021-12-05 ENCOUNTER — Other Ambulatory Visit: Payer: Self-pay

## 2021-12-05 DIAGNOSIS — I5042 Chronic combined systolic (congestive) and diastolic (congestive) heart failure: Secondary | ICD-10-CM | POA: Insufficient documentation

## 2021-12-05 DIAGNOSIS — L97312 Non-pressure chronic ulcer of right ankle with fat layer exposed: Secondary | ICD-10-CM | POA: Diagnosis not present

## 2021-12-05 DIAGNOSIS — Z9981 Dependence on supplemental oxygen: Secondary | ICD-10-CM | POA: Insufficient documentation

## 2021-12-05 DIAGNOSIS — E1151 Type 2 diabetes mellitus with diabetic peripheral angiopathy without gangrene: Secondary | ICD-10-CM | POA: Diagnosis not present

## 2021-12-05 DIAGNOSIS — E11622 Type 2 diabetes mellitus with other skin ulcer: Secondary | ICD-10-CM | POA: Diagnosis not present

## 2021-12-05 DIAGNOSIS — J449 Chronic obstructive pulmonary disease, unspecified: Secondary | ICD-10-CM | POA: Insufficient documentation

## 2021-12-05 DIAGNOSIS — E119 Type 2 diabetes mellitus without complications: Secondary | ICD-10-CM | POA: Diagnosis not present

## 2021-12-05 DIAGNOSIS — X58XXXA Exposure to other specified factors, initial encounter: Secondary | ICD-10-CM | POA: Insufficient documentation

## 2021-12-05 DIAGNOSIS — S61412A Laceration without foreign body of left hand, initial encounter: Secondary | ICD-10-CM | POA: Diagnosis not present

## 2021-12-05 NOTE — Progress Notes (Addendum)
[name], [name] ([id number]) Visit Report for 12/05/2021 Chief Complaint Document Details Patient Name: [name], [name]. Date of Service: 12/05/2021 2:45 PM Medical Record Number: [id number] Patient Account Number: [id number] Date of Birth/Sex: [birth year] (80 y.o. F) Treating RN: Carlene Coria Primary Care Provider: Frazier Richards Other Clinician: Referring Provider: Frazier Richards Treating Provider/Extender: Skipper Cliche in Treatment: 10 Information Obtained from: Patient Chief Complaint Right lateral ankle ulcer and left hand skin tear Electronic Signature(s) Signed: 12/05/2021 3:03:40 PM By: Worthy Keeler PA-C Entered By: Worthy Keeler on 12/05/2021 15:03:40 [name] ([id number]) -------------------------------------------------------------------------------- HPI Details Patient Name: [name]. Date of Service: 12/05/2021 2:45 PM Medical Record Number: [id number] Patient Account Number: [id number] Date of Birth/Sex: [birth year] (80 y.o. F) Treating RN: Carlene Coria Primary Care Provider: Frazier Richards Other Clinician: Referring Provider: Frazier Richards Treating Provider/Extender: Skipper Cliche in Treatment: 10 History of Present Illness HPI Description: 09/21/2021 upon evaluation today patient appears to be doing somewhat poorly in regard to her wound on her right lateral ankle. She has been tolerating the dressing changes without complication. Fortunately there does not not appear to be any signs of active infection systemically at this time which is great news. Likely also do not see any major issues which is good news. Nonetheless the biggest problem is that even with attempting to treat this and try to get things under control on her own she is really not been able to get this healed. She does tend to sleep on her right side exclusively due to the fact that she is not able to lay any other way due to pain. She has issues with her  right hip. She is a diabetic her last hemoglobin A1c was 5.6 however this should not be affecting her ability to heal she also had an MRI in July which was negative for osteomyelitis. Overall everything checks out okay but nonetheless she is still experiencing the open wound that she just cannot get closed. She does wear oxygen 24/7. She has a history of congestive heart failure, COPD, and again the diabetes mellitus type 2. 09/29/2021 upon evaluation today patient appears to be doing roughly the same medial movement better as far as some of the necrotic tissue on the surface of the wound is concerned it is kind of gently coming off little by little. With that being said I do not see any signs of active infection systemically at this point which is good news. 10/06/2021 upon evaluation today patient appears to be doing excellent in regard to her wound from an infection standpoint I do not see any issues here currently. Unfortunately she continues develop slough over the surface of the wound where using collagen I am not certain that skin to be the best way to go going forward. I really think she may do well with Iodoflex. 10/13/2021 upon evaluation today patient appears to be doing well with regard to her wound. I actually feel like this is showing signs of improvement with regard to the overall appearance I do believe the Iodoflex has been helpful. 10/20/2021 upon evaluation today patient appears to be doing a little better in regard to her ankle ulcer. With that being said she has a new wound on her hand as well due to skin tear that occurred on the way home last week after I saw her. Fortunately there does not appear to be any evidence of active infection at this time. No fevers, chills, nausea, vomiting, or diarrhea. 10/27/2021 upon evaluation today  10/27/2021 upon evaluation today patient appears to be doing better in regard to the wound on the hand as well as the ankle both are showing signs of  being smaller which is great news. I do not see any signs of infection which is also great news. In general I think that we are headed in the appropriate direction. 11/03/21 upon evaluation today patient appears to be doing well with regard to her wounds. She has been tolerating the dressing changes without complication. Fortunately there does not appear to be any signs of active infection at this time which is great news. No fevers, chills, nausea, vomiting, or diarrhea. 11/20/2021 upon evaluation today patient appears to be doing decently well in regard to her wounds. This is good news. Fortunately the hand is almost completely healed and the foot is smaller although this week it is about the same compared to what we saw previous. I do not see any evidence of infection there is little bit of dry skin we will continue to remove this is still taking a little bit longer than what I would expect to heal this makes me concerned about the fact of possibly needing to look into a more formal arterial study with regard to her arterial flow to see what they stand. 11/27/2021 upon evaluation today patient appears to be doing well with regard to her wound although is not significantly smaller is also not any bigger which is good news. 12/05/2021 upon evaluation today patient's wound is actually showing signs of improvement and looks to be doing quite well at about half the size for at least 60% of the size that it was last week. Overall I am extremely pleased with that improvement. Electronic Signature(s) Signed: 12/05/2021 3:14:56 PM By: Worthy Keeler PA-C Entered By: Worthy Keeler on 12/05/2021 15:14:56 [name] ([id number]) -------------------------------------------------------------------------------- Physical Exam Details Patient Name: [name]. Date of Service: 12/05/2021 2:45 PM Medical Record Number: [id number] Patient Account Number: [id number] Date of Birth/Sex: 06-30-1941 (80  y.o. F) Treating RN: Carlene Coria Primary Care Provider: Frazier Richards Other Clinician: Referring Provider: Frazier Richards Treating Provider/Extender: Skipper Cliche in Treatment: 25 Constitutional Well-nourished and well-hydrated in no acute distress. Respiratory normal breathing without difficulty. Psychiatric this patient is able to make decisions and demonstrates good insight into disease process. Alert and Oriented x 3. pleasant and cooperative. Notes Upon inspection patient's wound bed actually showed signs of good granulation and epithelization at this point. Fortunately I do not see any signs of active infection which is great news and overall I feel like that the patient is doing quite well. Electronic Signature(s) Signed: 12/05/2021 3:16:55 PM By: Worthy Keeler PA-C Entered By: Worthy Keeler on 12/05/2021 15:16:55 [name] ([id number]) -------------------------------------------------------------------------------- Physician Orders Details Patient Name: [name]. Date of Service: 12/05/2021 2:45 PM Medical Record Number: [id number] Patient Account Number: [id number] Date of Birth/Sex: 04/13/1941 (80 y.o. F) Treating RN: Carlene Coria Primary Care Provider: Frazier Richards Other Clinician: Referring Provider: Frazier Richards Treating Provider/Extender: Skipper Cliche in Treatment: 10 Verbal / Phone Orders: No Diagnosis Coding ICD-10 Coding Code Description E11.622 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 S61.412A Laceration without foreign body of left hand, initial encounter J44.9 Chronic obstructive pulmonary disease, unspecified I50.42 Chronic combined systolic (congestive) and diastolic (congestive) heart failure Z99.81 Dependence on supplemental oxygen Follow-up Appointments o Return Appointment in 2 weeks. o Nurse Visit as needed  Bathing/  Shower/ Hygiene o May shower; gently cleanse wound with antibacterial soap, rinse and pat dry prior to dressing wounds o No tub bath. Anesthetic (Use 'Patient Medications' Section for Anesthetic Order Entry) o Lidocaine applied to wound bed Edema Control - Lymphedema / Segmental Compressive Device / Other o Elevate leg(s) parallel to the floor when sitting. Additional Orders / Instructions o Follow Nutritious Diet and Increase Protein Intake Oxygen Administration o Pulse Oxymetry measurement in clinic - PRN o While patient is in clinic, provide supplemental oxygen via nasal cannula Wound Treatment Wound #1 - Malleolus Wound Laterality: Right, Lateral Cleanser: Soap and Water 1 x Per Day/30 Days Discharge Instructions: Gently cleanse wound with antibacterial soap, rinse and pat dry prior to dressing wounds Primary Dressing: Xeroform-HBD 2x2 (in/in) (Generic) 1 x Per Day/30 Days Discharge Instructions: Apply Xeroform-HBD 2x2 (in/in) as directed Secondary Dressing: Zetuvit Plus Silicone Border Dressing 4x4 (in/in) (Generic) 1 x Per Day/30 Days Electronic Signature(s) Signed: 12/05/2021 3:44:10 PM By: Carlene Coria RN Signed: 12/05/2021 4:19:41 PM By: Worthy Keeler PA-C Entered By: Carlene Coria on 12/05/2021 15:10:13 [name] ([id number]) -------------------------------------------------------------------------------- Problem List Details Patient Name: [name]. Date of Service: 12/05/2021 2:45 PM Medical Record Number: [id number] Patient Account Number: [id number] Date of Birth/Sex: 08/12/1941 (80 y.o. F) Treating RN: Carlene Coria Primary Care Provider: Frazier Richards Other Clinician: Referring Provider: Frazier Richards Treating Provider/Extender: Skipper Cliche in Treatment: 10 Active Problems ICD-10 Encounter Code Description Active Date MDM Diagnosis E11.622 Type 2 diabetes mellitus with other skin ulcer 09/21/2021 No Yes L97.312  Non-pressure chronic ulcer of right ankle with fat layer exposed 09/21/2021 No Yes S61.412A Laceration without foreign body of left hand, initial encounter 10/20/2021 No Yes J44.9 Chronic obstructive pulmonary disease, unspecified 09/21/2021 No Yes I50.42 Chronic combined systolic (congestive) and diastolic (congestive) heart 09/21/2021 No Yes failure Z99.81 Dependence on supplemental oxygen 09/21/2021 No Yes Inactive Problems Resolved Problems Electronic Signature(s) Signed: 12/05/2021 3:03:37 PM By: Worthy Keeler PA-C Entered By: Worthy Keeler on 12/05/2021 15:03:36 [name] ([id number]) -------------------------------------------------------------------------------- Progress Note Details Patient Name: [name]. Date of Service: 12/05/2021 2:45 PM Medical Record Number: [id number] Patient Account Number: [id number] Date of Birth/Sex: 1941/07/24 (80 y.o. F) Treating RN: Carlene Coria Primary Care Provider: Frazier Richards Other Clinician: Referring Provider: Frazier Richards Treating Provider/Extender: Skipper Cliche in Treatment: 10 Subjective Chief Complaint Information obtained from Patient Right lateral ankle ulcer and left hand skin tear History of Present Illness (HPI) 09/21/2021 upon evaluation today patient appears to be doing somewhat poorly in regard to her wound on her right lateral ankle. She has been tolerating the dressing changes without complication. Fortunately there does not not appear to be any signs of active infection systemically at this time which is great news. Likely also do not see any major issues which is good news. Nonetheless the biggest problem is that even with attempting to treat this and try to get things under control on her own she is really not been able to get this healed. She does tend to sleep on her right side exclusively due to the fact that she is not able to lay any other way due to pain. She has issues with her right  hip. She is a diabetic her last hemoglobin A1c was 5.6 however this should not be affecting her ability to heal she also had an MRI in July which was negative for osteomyelitis. Overall everything checks out okay but nonetheless she is still experiencing the  open wound that she just cannot get closed. She does wear oxygen 24/7. She has a history of congestive heart failure, COPD, and again the diabetes mellitus type 2. 09/29/2021 upon evaluation today patient appears to be doing roughly the same medial movement better as far as some of the necrotic tissue on the surface of the wound is concerned it is kind of gently coming off little by little. With that being said I do not see any signs of active infection systemically at this point which is good news. 10/06/2021 upon evaluation today patient appears to be doing excellent in regard to her wound from an infection standpoint I do not see any issues here currently. Unfortunately she continues develop slough over the surface of the wound where using collagen I am not certain that skin to be the best way to go going forward. I really think she may do well with Iodoflex. 10/13/2021 upon evaluation today patient appears to be doing well with regard to her wound. I actually feel like this is showing signs of improvement with regard to the overall appearance I do believe the Iodoflex has been helpful. 10/20/2021 upon evaluation today patient appears to be doing a little better in regard to her ankle ulcer. With that being said she has a new wound on her hand as well due to skin tear that occurred on the way home last week after I saw her. Fortunately there does not appear to be any evidence of active infection at this time. No fevers, chills, nausea, vomiting, or diarrhea. 10/27/2021 upon evaluation today 10/27/2021 upon evaluation today patient appears to be doing better in regard to the wound on the hand as well as the ankle both are showing signs of being  smaller which is great news. I do not see any signs of infection which is also great news. In general I think that we are headed in the appropriate direction. 11/03/21 upon evaluation today patient appears to be doing well with regard to her wounds. She has been tolerating the dressing changes without complication. Fortunately there does not appear to be any signs of active infection at this time which is great news. No fevers, chills, nausea, vomiting, or diarrhea. 11/20/2021 upon evaluation today patient appears to be doing decently well in regard to her wounds. This is good news. Fortunately the hand is almost completely healed and the foot is smaller although this week it is about the same compared to what we saw previous. I do not see any evidence of infection there is little bit of dry skin we will continue to remove this is still taking a little bit longer than what I would expect to heal this makes me concerned about the fact of possibly needing to look into a more formal arterial study with regard to her arterial flow to see what they stand. 11/27/2021 upon evaluation today patient appears to be doing well with regard to her wound although is not significantly smaller is also not any bigger which is good news. 12/05/2021 upon evaluation today patient's wound is actually showing signs of improvement and looks to be doing quite well at about half the size for at least 60% of the size that it was last week. Overall I am extremely pleased with that improvement. Objective Constitutional Well-nourished and well-hydrated in no acute distress. Vitals Time Taken: 2:50 PM, Height: 65 in, Weight: 98 lbs, BMI: 16.3, Temperature: 98.2 F, Pulse: 74 bpm, Respiratory Rate: 18 breaths/min, [name], Jasmine M. ([id number]) Blood Pressure:  108/62 mmHg. Respiratory normal breathing without difficulty. Psychiatric this patient is able to make decisions and demonstrates good insight into disease process.  Alert and Oriented x 3. pleasant and cooperative. General Notes: Upon inspection patient's wound bed actually showed signs of good granulation and epithelization at this point. Fortunately I do not see any signs of active infection which is great news and overall I feel like that the patient is doing quite well. Integumentary (Hair, Skin) Wound #1 status is Open. Original cause of wound was Gradually Appeared. The date acquired was: 05/21/2021. The wound has been in treatment 10 weeks. The wound is located on the Right,Lateral Malleolus. The wound measures 0.2cm length x 0.2cm width x 0.1cm depth; 0.031cm^2 area and 0.003cm^3 volume. There is no tunneling or undermining noted. There is a small amount of serosanguineous drainage noted. There is no granulation within the wound bed. There is a large (67-100%) amount of necrotic tissue within the wound bed including Adherent Slough. 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 Nurse Visit as needed Bathing/ Shower/ Hygiene: May shower; gently cleanse wound with antibacterial soap, rinse and pat dry prior to dressing wounds No tub bath. Anesthetic (Use 'Patient Medications' Section for Anesthetic Order Entry): Lidocaine applied to wound bed Edema Control - Lymphedema / Segmental Compressive Device / Other: Elevate leg(s) parallel to the floor when sitting. Additional Orders / Instructions: Follow Nutritious Diet and Increase Protein Intake Oxygen Administration: Pulse Oxymetry measurement in clinic - PRN While patient is in clinic, provide supplemental  oxygen via nasal cannula WOUND #1: - Malleolus Wound Laterality: Right, Lateral Cleanser: Soap and Water 1 x Per Day/30 Days Discharge Instructions: Gently cleanse wound with antibacterial soap, rinse and pat dry prior to dressing wounds Primary Dressing: Xeroform-HBD 2x2 (in/in) (Generic) 1 x Per Day/30 Days Discharge Instructions: Apply Xeroform-HBD 2x2 (in/in) as directed Secondary Dressing: Zetuvit Plus Silicone Border Dressing 4x4 (in/in) (Generic) 1 x Per Day/30 Days 1. She is doing so well and would recommend that we actually continue with the wound care measures as before specifically with regard to the Xeroform gauze dressing which I think is probably can to be the best way to go. 2. I am also can recommend that we have the patient continue to monitor for any signs of worsening or infection if anything changes she should let me know. 3. She is concerned about the cost of coming to see [place] I completely understand. For that reason I Georgina Peer start spreading things out a little bit here again I think it may take a little bit of time for [place] to get her completely closed I think we are on the right track. We will see patient back for reevaluation in 2 weeks here in the clinic. If anything worsens or changes patient will contact our office for additional recommendations. [name], [name] ([id number]) Electronic Signature(s) Signed: 12/05/2021 3:17:58 PM By: Worthy Keeler PA-C Entered By: Worthy Keeler on 12/05/2021 15:17:57 [name] ([id number]) -------------------------------------------------------------------------------- SuperBill Details Patient Name: [name]. Date of Service: 12/05/2021 Medical Record Number: [id number] Patient Account Number: [id number] Date of Birth/Sex: [birth year] (80 y.o. F) Treating RN: Carlene Coria Primary Care Provider: Frazier Richards Other Clinician: Referring Provider: Ouida Sills,  Ruthann Cancer Treating Provider/Extender: Jeri Cos Weeks  in Treatment: 10 Diagnosis Coding ICD-10 Codes Code Description E11.622 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 S61.412A Laceration without foreign body of left hand, initial encounter J44.9 Chronic obstructive pulmonary disease, unspecified I50.42 Chronic combined systolic (congestive) and diastolic (congestive) heart failure Z99.81 Dependence on supplemental oxygen Facility Procedures CPT4 Code: [id number] Description: 99213 - WOUND CARE VISIT-LEV 3 EST PT Modifier: Quantity: 1 Physician Procedures CPT4 Code: [id number] Description: 99213 - WC PHYS LEVEL 3 - EST PT Modifier: Quantity: 1 CPT4 Code: Description: ICD-10 Diagnosis Description E11.622 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 S61.412A Laceration without foreign body of left hand, initial encounter J44.9  Chronic obstructive pulmonary disease, unspecified Modifier: Quantity: Electronic Signature(s) Signed: 12/05/2021 3:18:31 PM By: Worthy Keeler PA-C Entered By: Worthy Keeler on 12/05/2021 15:18:30

## 2021-12-05 NOTE — Progress Notes (Signed)
NYKERIA, MEALING ([id number]) Visit Report for 12/05/2021 Arrival Information Details Patient Name: [name], [name]. Date of Service: 12/05/2021 2:45 PM Medical Record Number: [id number] Patient Account Number: [id number] Date of Birth/Sex: 1941/12/02 (80 y.o. F) Treating RN: Carlene Coria Primary Care Deetya Drouillard: Frazier Richards Other Clinician: Referring Osaze Hubbert: Frazier Richards Treating Mcclellan Demarais/Extender: Skipper Cliche in Treatment: 10 Visit Information History Since Last Visit All ordered tests and consults were completed: No Patient Arrived: Wheel Chair Added or deleted any medications: No Arrival Time: 14:44 Any new allergies or adverse reactions: No Accompanied By: grandaughter Had a fall or experienced change in No Transfer Assistance: None activities of daily living that may affect Patient Identification Verified: Yes risk of falls: Secondary Verification Process Completed: Yes Signs or symptoms of abuse/neglect since last visito No Patient Requires Transmission-Based Precautions: No Hospitalized since last visit: No Patient Has Alerts: No Implantable device outside of the clinic excluding No cellular tissue based products placed in the center since last visit: Has Dressing in Place as Prescribed: Yes Pain Present Now: No Electronic Signature(s) Signed: 12/05/2021 3:44:10 PM By: Carlene Coria RN Entered By: Carlene Coria on 12/05/2021 14:50:25 [name] ([id number]) -------------------------------------------------------------------------------- Clinic Level of Care Assessment Details Patient Name: [name]. Date of Service: 12/05/2021 2:45 PM Medical Record Number: [id number] Patient Account Number: [id number] Date of Birth/Sex: [birth year] (80 y.o. F) Treating RN: Carlene Coria Primary Care Ceyda Peterka: Frazier Richards Other Clinician: Referring Dmitry Macomber: Frazier Richards Treating Blessyn [place]/Extender: Skipper Cliche in Treatment:  10 Clinic Level of Care Assessment Items TOOL 4 Quantity Score X - Use when only an EandM is performed on FOLLOW-UP visit 1 0 ASSESSMENTS - Nursing Assessment / Reassessment X - Reassessment of Co-morbidities (includes updates in patient status) 1 10 X- 1 5 Reassessment of Adherence to Treatment Plan ASSESSMENTS - Wound and Skin Assessment / Reassessment X - Simple Wound Assessment / Reassessment - one wound 1 5 []  - 0 Complex Wound Assessment / Reassessment - multiple wounds []  - 0 Dermatologic / Skin Assessment (not related to wound area) ASSESSMENTS - Focused Assessment []  - Circumferential Edema Measurements - multi extremities 0 []  - 0 Nutritional Assessment / Counseling / Intervention []  - 0 Lower Extremity Assessment (monofilament, tuning fork, pulses) []  - 0 Peripheral Arterial Disease Assessment (using hand held doppler) ASSESSMENTS - Ostomy and/or Continence Assessment and Care []  - Incontinence Assessment and Management 0 []  - 0 Ostomy Care Assessment and Management (repouching, etc.) PROCESS - Coordination of Care X - Simple Patient / Family Education for ongoing care 1 15 []  - 0 Complex (extensive) Patient / Family Education for ongoing care X- 1 10 Staff obtains Programmer, systems, Records, Test Results / Process Orders []  - 0 Staff telephones HHA, Nursing Homes / Clarify orders / etc []  - 0 Routine Transfer to another Facility (non-emergent condition) []  - 0 Routine Hospital Admission (non-emergent condition) []  - 0 New Admissions / Biomedical engineer / Ordering NPWT, Apligraf, etc. []  - 0 Emergency Hospital Admission (emergent condition) X- 1 10 Simple Discharge Coordination []  - 0 Complex (extensive) Discharge Coordination PROCESS - Special Needs []  - Pediatric / Minor Patient Management 0 []  - 0 Isolation Patient Management []  - 0 Hearing / Language / Visual special needs []  - 0 Assessment of Community assistance (transportation, D/C planning,  etc.) []  - 0 Additional assistance / Altered mentation []  - 0 Support Surface(s) Assessment (bed, cushion, seat, etc.) INTERVENTIONS - Wound Cleansing / Measurement [name], [name]. ([id number]) X- 1 5 Simple Wound Cleansing -  one wound []  - 0 Complex Wound Cleansing - multiple wounds X- 1 5 Wound Imaging (photographs - any number of wounds) []  - 0 Wound Tracing (instead of photographs) X- 1 5 Simple Wound Measurement - one wound []  - 0 Complex Wound Measurement - multiple wounds INTERVENTIONS - Wound Dressings X - Small Wound Dressing one or multiple wounds 1 10 []  - 0 Medium Wound Dressing one or multiple wounds []  - 0 Large Wound Dressing one or multiple wounds X- 1 5 Application of Medications - topical []  - 0 Application of Medications - injection INTERVENTIONS - Miscellaneous []  - External ear exam 0 []  - 0 Specimen Collection (cultures, biopsies, blood, body fluids, etc.) []  - 0 Specimen(s) / Culture(s) sent or taken to Lab for analysis []  - 0 Patient Transfer (multiple staff / Civil Service fast streamer / Similar devices) []  - 0 Simple Staple / Suture removal (25 or less) []  - 0 Complex Staple / Suture removal (26 or more) []  - 0 Hypo / Hyperglycemic Management (close monitor of Blood Glucose) []  - 0 Ankle / Brachial Index (ABI) - do not check if billed separately X- 1 5 Vital Signs Has the patient been seen at the hospital within the last three years: Yes Total Score: 90 Level Of Care: New/Established - Level 3 Electronic Signature(s) Signed: 12/05/2021 3:44:10 PM By: Carlene Coria RN Entered By: Carlene Coria on 12/05/2021 15:15:27 [name] ([id number]) -------------------------------------------------------------------------------- Encounter Discharge Information Details Patient Name: [name]. Date of Service: 12/05/2021 2:45 PM Medical Record Number: [id number] Patient Account Number: [id number] Date of Birth/Sex: [birth year] (80 y.o. F) Treating  RN: Carlene Coria Primary Care Edman Lipsey: Frazier Richards Other Clinician: Referring Jamilee Lafosse: Frazier Richards Treating Camila [place]/Extender: Skipper Cliche in Treatment: 10 Encounter Discharge Information Items Discharge Condition: Stable Ambulatory Status: Wheelchair Discharge Destination: Home Transportation: Private Auto Accompanied By: self Schedule Follow-up Appointment: Yes Clinical Summary of Care: Patient Declined Electronic Signature(s) Signed: 12/05/2021 3:44:10 PM By: Carlene Coria RN Entered By: Carlene Coria on 12/05/2021 15:16:27 [name] ([id number]) -------------------------------------------------------------------------------- Lower Extremity Assessment Details Patient Name: [name]. Date of Service: 12/05/2021 2:45 PM Medical Record Number: [id number] Patient Account Number: [id number] Date of Birth/Sex: 1941-01-14 (80 y.o. F) Treating RN: Carlene Coria Primary Care Bora Broner: Frazier Richards Other Clinician: Referring Londan Coplen: Frazier Richards Treating Brance Dartt/Extender: Jeri Cos Weeks in Treatment: 10 Vascular Assessment Pulses: Dorsalis Pedis Palpable: [Right:Yes] Electronic Signature(s) Signed: 12/05/2021 3:44:10 PM By: Carlene Coria RN Entered By: Carlene Coria on 12/05/2021 14:57:41 [name] ([id number]) -------------------------------------------------------------------------------- Multi Wound Chart Details Patient Name: [name]. Date of Service: 12/05/2021 2:45 PM Medical Record Number: [id number] Patient Account Number: [id number] Date of Birth/Sex: 1941-01-31 (80 y.o. F) Treating RN: Carlene Coria Primary Care Taim Wurm: Frazier Richards Other Clinician: Referring Aarib Pulido: Frazier Richards Treating Nathali Vent/Extender: Skipper Cliche in Treatment: 10 Vital Signs Height(in): 65 Pulse(bpm): 61 Weight(lbs): 66 Blood Pressure(mmHg): 108/62 Body Mass Index(BMI): 16 Temperature(F):  98.2 Respiratory Rate(breaths/min): 18 Photos: [N/A:N/A] Wound Location: Right, Lateral Malleolus N/A N/A Wounding Event: Gradually Appeared N/A N/A Primary Etiology: Diabetic Wound/Ulcer of the Lower N/A N/A Extremity Comorbid History: Chronic Obstructive Pulmonary N/A N/A Disease (COPD), Congestive Heart Failure, Type II Diabetes Date Acquired: 05/21/2021 N/A N/A Weeks of Treatment: 10 N/A N/A Wound Status: Open N/A N/A Measurements L x W x D (cm) 0.2x0.2x0.1 N/A N/A Area (cm) : 0.031 N/A N/A Volume (cm) : 0.003 N/A N/A % Reduction in Area: 80.30% N/A N/A % Reduction in Volume: 81.30% N/A N/A Classification: Grade  1 N/A N/A Exudate Amount: Small N/A N/A Exudate Type: Serosanguineous N/A N/A Exudate Color: red, brown N/A N/A Granulation Amount: None Present (0%) N/A N/A Necrotic Amount: Large (67-100%) N/A N/A Exposed Structures: Fascia: No N/A N/A Fat Layer (Subcutaneous Tissue): No Tendon: No Muscle: No Joint: No Bone: No Epithelialization: None N/A N/A Treatment Notes Electronic Signature(s) Signed: 12/05/2021 3:44:10 PM By: Carlene Coria RN Entered By: Carlene Coria on 12/05/2021 15:08:18 [name] ([id number]) -------------------------------------------------------------------------------- [facility] Details Patient Name: [name]. Date of Service: 12/05/2021 2:45 PM Medical Record Number: [id number] Patient Account Number: [id number] Date of Birth/Sex: 05/13/1941 (80 y.o. F) Treating RN: Carlene Coria Primary Care Sherlon Nied: Frazier Richards Other Clinician: Referring Adalynne Steffensmeier: Frazier Richards Treating Mubashir Mallek/Extender: Skipper Cliche in Treatment: 10 Active Inactive Wound/Skin Impairment Nursing Diagnoses: Impaired tissue integrity Goals: Patient/caregiver will verbalize understanding of skin care regimen Date Initiated: 09/21/2021 Date Inactivated: 10/27/2021 Target Resolution Date: 09/21/2021 Goal Status:  Met Ulcer/skin breakdown will have a volume reduction of 30% by week 4 Date Initiated: 09/21/2021 Date Inactivated: 10/27/2021 Target Resolution Date: 10/21/2021 Goal Status: Met Ulcer/skin breakdown will have a volume reduction of 50% by week 8 Date Initiated: 09/21/2021 Date Inactivated: 12/05/2021 Target Resolution Date: 11/21/2021 Goal Status: Met Ulcer/skin breakdown will have a volume reduction of 80% by week 12 Date Initiated: 09/21/2021 Target Resolution Date: 12/21/2021 Goal Status: Active Ulcer/skin breakdown will heal within 14 weeks Date Initiated: 09/21/2021 Target Resolution Date: 01/21/2022 Goal Status: Active Interventions: Assess patient/caregiver ability to obtain necessary supplies Assess patient/caregiver ability to perform ulcer/skin care regimen upon admission and as needed Assess ulceration(s) every visit Provide education on ulcer and skin care Treatment Activities: Referred to DME Madai Nuccio for dressing supplies : 09/21/2021 Skin care regimen initiated : 09/21/2021 Notes: Electronic Signature(s) Signed: 12/05/2021 3:44:10 PM By: Carlene Coria RN Entered By: Carlene Coria on 12/05/2021 15:07:56 [name] ([id number]) -------------------------------------------------------------------------------- Pain Assessment Details Patient Name: [name]. Date of Service: 12/05/2021 2:45 PM Medical Record Number: [id number] Patient Account Number: [id number] Date of Birth/Sex: 13-Sep-1941 (80 y.o. F) Treating RN: Carlene Coria Primary Care Damonica Chopra: Frazier Richards Other Clinician: Referring Dorie Ohms: Frazier Richards Treating Malavika Lira/Extender: Skipper Cliche in Treatment: 10 Active Problems Location of Pain Severity and Description of Pain Patient Has Paino No Site Locations Pain Management and Medication Current Pain Management: Electronic Signature(s) Signed: 12/05/2021 3:44:10 PM By: Carlene Coria RN Entered By: Carlene Coria on 12/05/2021  14:50:48 [name] ([id number]) -------------------------------------------------------------------------------- Patient/Caregiver Education Details Patient Name: [name]. Date of Service: 12/05/2021 2:45 PM Medical Record Number: [id number] Patient Account Number: [id number] Date of Birth/Gender: [birth year] (80 y.o. F) Treating RN: Carlene Coria Primary Care Physician: Frazier Richards Other Clinician: Referring Physician: Frazier Richards Treating Physician/Extender: Skipper Cliche in Treatment: 10 Education Assessment Education Provided To: Patient Education Topics Provided Wound/Skin Impairment: Methods: Explain/Verbal Responses: State content correctly Electronic Signature(s) Signed: 12/05/2021 3:44:10 PM By: Carlene Coria RN Entered By: Carlene Coria on 12/05/2021 15:15:46 [name] ([id number]) -------------------------------------------------------------------------------- Wound Assessment Details Patient Name: [name]. Date of Service: 12/05/2021 2:45 PM Medical Record Number: [id number] Patient Account Number: [id number] Date of Birth/Sex: 14-Sep-1941 (80 y.o. F) Treating RN: Carlene Coria Primary Care Dann Galicia: Frazier Richards Other Clinician: Referring Jaquasia Doscher: Frazier Richards Treating Saif Peter/Extender: Skipper Cliche in Treatment: 10 Wound Status Wound Number: 1 Primary Diabetic Wound/Ulcer of the Lower Extremity Etiology: Wound Location: Right, Lateral Malleolus Wound Open Wounding Event: Gradually Appeared Status: Date Acquired: 05/21/2021 Comorbid Chronic Obstructive Pulmonary Disease (COPD),  Weeks Of Treatment: 10 History: Congestive Heart Failure, Type II Diabetes Clustered Wound: No Photos Wound Measurements Length: (cm) 0.2 Width: (cm) 0.2 Depth: (cm) 0.1 Area: (cm) 0.031 Volume: (cm) 0.003 % Reduction in Area: 80.3% % Reduction in Volume: 81.3% Epithelialization: None Tunneling: No Undermining:  No Wound Description Classification: Grade 1 Exudate Amount: Small Exudate Type: Serosanguineous Exudate Color: red, brown Foul Odor After Cleansing: No Slough/Fibrino Yes Wound Bed Granulation Amount: None Present (0%) Exposed Structure Necrotic Amount: Large (67-100%) Fascia Exposed: No Necrotic Quality: Adherent Slough Fat Layer (Subcutaneous Tissue) Exposed: No Tendon Exposed: No Muscle Exposed: No Joint Exposed: No Bone Exposed: No Treatment Notes Wound #1 (Malleolus) Wound Laterality: Right, Lateral Cleanser Soap and Water Discharge Instruction: Gently cleanse wound with antibacterial soap, rinse and pat dry prior to dressing wounds Peri-Wound Care [name], [name] ([id number]) Topical Primary Dressing Xeroform-HBD 2x2 (in/in) Discharge Instruction: Apply Xeroform-HBD 2x2 (in/in) as directed Secondary Dressing Zetuvit Plus Silicone Border Dressing 4x4 (in/in) Secured With Compression Wrap Compression Stockings Add-Ons Electronic Signature(s) Signed: 12/05/2021 3:44:10 PM By: Carlene Coria RN Entered By: Carlene Coria on 12/05/2021 14:57:09 [name] ([id number]) -------------------------------------------------------------------------------- [facility] Details Patient Name: [name]. Date of Service: 12/05/2021 2:45 PM Medical Record Number: [id number] Patient Account Number: [id number] Date of Birth/Sex: December 10, 1941 (80 y.o. F) Treating RN: Carlene Coria Primary Care Senta Kantor: Frazier Richards Other Clinician: Referring Rheana Casebolt: Frazier Richards Treating Mavryk Pino/Extender: Skipper Cliche in Treatment: 10 Vital Signs Time Taken: 14:50 Temperature (F): 98.2 Height (in): 65 Pulse (bpm): 74 Weight (lbs): 98 Respiratory Rate (breaths/min): 18 Body Mass Index (BMI): 16.3 Blood Pressure (mmHg): 108/62 Reference Range: 80 - 120 mg / dl Electronic Signature(s) Signed: 12/05/2021 3:44:10 PM By: Carlene Coria RN Entered By: Carlene Coria on  12/05/2021 14:50:41

## 2021-12-12 ENCOUNTER — Encounter: Payer: PPO | Admitting: Physician Assistant

## 2021-12-19 ENCOUNTER — Encounter: Payer: PPO | Admitting: Physician Assistant

## 2021-12-19 ENCOUNTER — Other Ambulatory Visit: Payer: Self-pay

## 2021-12-19 DIAGNOSIS — E1151 Type 2 diabetes mellitus with diabetic peripheral angiopathy without gangrene: Secondary | ICD-10-CM | POA: Diagnosis not present

## 2021-12-19 DIAGNOSIS — E11622 Type 2 diabetes mellitus with other skin ulcer: Secondary | ICD-10-CM | POA: Diagnosis not present

## 2021-12-19 DIAGNOSIS — L97312 Non-pressure chronic ulcer of right ankle with fat layer exposed: Secondary | ICD-10-CM | POA: Diagnosis not present

## 2021-12-19 DIAGNOSIS — J449 Chronic obstructive pulmonary disease, unspecified: Secondary | ICD-10-CM | POA: Diagnosis not present

## 2021-12-19 DIAGNOSIS — L98499 Non-pressure chronic ulcer of skin of other sites with unspecified severity: Secondary | ICD-10-CM | POA: Diagnosis not present

## 2021-12-19 NOTE — Progress Notes (Addendum)
SIMI, BRIEL ([id number]) Visit Report for 12/19/2021 Arrival Information Details Patient Name: [name], [name]. Date of Service: 12/19/2021 3:00 PM Medical Record Number: [id number] Patient Account Number: [id number] Date of Birth/Sex: September 18, 1941 (80 y.o. F) Treating RN: Levora Dredge Primary Care Jaidynn Balster: Frazier Richards Other Clinician: Referring Kailan Carmen: Frazier Richards Treating Oshea Percival/Extender: Skipper Cliche in Treatment: 12 Visit Information History Since Last Visit Added or deleted any medications: No Patient Arrived: Wheel Chair Any new allergies or adverse reactions: No Arrival Time: 15:07 Had a fall or experienced change in No Accompanied By: granddaughter activities of daily living that may affect Transfer Assistance: EasyPivot Patient risk of falls: Lift Hospitalized since last visit: No Patient Identification Verified: Yes Has Dressing in Place as Prescribed: Yes Secondary Verification Process Completed: Yes Pain Present Now: No Patient Requires Transmission-Based No Precautions: Patient Has Alerts: No Electronic Signature(s) Signed: 12/20/2021 12:56:33 PM By: Levora Dredge Entered By: Levora Dredge on 12/19/2021 15:14:03 [name] ([id number]) -------------------------------------------------------------------------------- Clinic Level of Care Assessment Details Patient Name: [name]. Date of Service: 12/19/2021 3:00 PM Medical Record Number: [id number] Patient Account Number: [id number] Date of Birth/Sex: [birth year] (80 y.o. F) Treating RN: Levora Dredge Primary Care Naol Ontiveros: Frazier Richards Other Clinician: Referring Vennie Salsbury: Frazier Richards Treating Tomia Enlow/Extender: Skipper Cliche in Treatment: 12 Clinic Level of Care Assessment Items TOOL 4 Quantity Score X - Use when only an EandM is performed on FOLLOW-UP visit 1 0 ASSESSMENTS - Nursing Assessment / Reassessment []  - Reassessment of  Co-morbidities (includes updates in patient status) 0 []  - 0 Reassessment of Adherence to Treatment Plan ASSESSMENTS - Wound and Skin Assessment / Reassessment X - Simple Wound Assessment / Reassessment - one wound 1 5 []  - 0 Complex Wound Assessment / Reassessment - multiple wounds []  - 0 Dermatologic / Skin Assessment (not related to wound area) ASSESSMENTS - Focused Assessment []  - Circumferential Edema Measurements - multi extremities 0 []  - 0 Nutritional Assessment / Counseling / Intervention []  - 0 Lower Extremity Assessment (monofilament, tuning fork, pulses) []  - 0 Peripheral Arterial Disease Assessment (using hand held doppler) ASSESSMENTS - Ostomy and/or Continence Assessment and Care []  - Incontinence Assessment and Management 0 []  - 0 Ostomy Care Assessment and Management (repouching, etc.) PROCESS - Coordination of Care X - Simple Patient / Family Education for ongoing care 1 15 []  - 0 Complex (extensive) Patient / Family Education for ongoing care []  - 0 Staff obtains Programmer, systems, Records, Test Results / Process Orders []  - 0 Staff telephones HHA, Nursing Homes / Clarify orders / etc []  - 0 Routine Transfer to another Facility (non-emergent condition) []  - 0 Routine Hospital Admission (non-emergent condition) []  - 0 New Admissions / Biomedical engineer / Ordering NPWT, Apligraf, etc. []  - 0 Emergency Hospital Admission (emergent condition) X- 1 10 Simple Discharge Coordination []  - 0 Complex (extensive) Discharge Coordination PROCESS - Special Needs []  - Pediatric / Minor Patient Management 0 []  - 0 Isolation Patient Management []  - 0 Hearing / Language / Visual special needs []  - 0 Assessment of Community assistance (transportation, D/C planning, etc.) []  - 0 Additional assistance / Altered mentation []  - 0 Support Surface(s) Assessment (bed, cushion, seat, etc.) INTERVENTIONS - Wound Cleansing / Measurement CHRISS, REDEL. ([id number]) X- 1  5 Simple Wound Cleansing - one wound []  - 0 Complex Wound Cleansing - multiple wounds X- 1 5 Wound Imaging (photographs - any number of wounds) []  - 0 Wound Tracing (instead of photographs) X- 1 5 Simple Wound  Measurement - one wound []  - 0 Complex Wound Measurement - multiple wounds INTERVENTIONS - Wound Dressings []  - Small Wound Dressing one or multiple wounds 0 []  - 0 Medium Wound Dressing one or multiple wounds []  - 0 Large Wound Dressing one or multiple wounds []  - 0 Application of Medications - topical []  - 0 Application of Medications - injection INTERVENTIONS - Miscellaneous []  - External ear exam 0 []  - 0 Specimen Collection (cultures, biopsies, blood, body fluids, etc.) []  - 0 Specimen(s) / Culture(s) sent or taken to Lab for analysis []  - 0 Patient Transfer (multiple staff / Civil Service fast streamer / Similar devices) []  - 0 Simple Staple / Suture removal (25 or less) []  - 0 Complex Staple / Suture removal (26 or more) []  - 0 Hypo / Hyperglycemic Management (close monitor of Blood Glucose) []  - 0 Ankle / Brachial Index (ABI) - do not check if billed separately X- 1 5 Vital Signs Has the patient been seen at the hospital within the last three years: Yes Total Score: 50 Level Of Care: New/Established - Level 2 Electronic Signature(s) Signed: 12/20/2021 12:56:33 PM By: Levora Dredge Entered By: Levora Dredge on 12/19/2021 15:35:41 [name] ([id number]) -------------------------------------------------------------------------------- Encounter Discharge Information Details Patient Name: [name]. Date of Service: 12/19/2021 3:00 PM Medical Record Number: [id number] Patient Account Number: [id number] Date of Birth/Sex: Nov 21, 1941 (80 y.o. F) Treating RN: Levora Dredge Primary Care Dalina Samara: Frazier Richards Other Clinician: Referring Herrick Hartog: Frazier Richards Treating Richardson Dubree/Extender: Skipper Cliche in Treatment: 12 Encounter  Discharge Information Items Discharge Condition: Stable Ambulatory Status: Wheelchair Discharge Destination: Home Transportation: Private Auto Accompanied By: grand daughter Schedule Follow-up Appointment: Yes Clinical Summary of Care: Electronic Signature(s) Signed: 12/20/2021 12:56:33 PM By: Levora Dredge Entered By: Levora Dredge on 12/19/2021 15:37:25 [name] ([id number]) -------------------------------------------------------------------------------- Lower Extremity Assessment Details Patient Name: [name]. Date of Service: 12/19/2021 3:00 PM Medical Record Number: [id number] Patient Account Number: [id number] Date of Birth/Sex: [birth year] (80 y.o. F) Treating RN: Levora Dredge Primary Care Margarie Mcguirt: Frazier Richards Other Clinician: Referring Tomekia Helton: Frazier Richards Treating Kashay Cavenaugh/Extender: Jeri Cos Weeks in Treatment: 12 Edema Assessment Assessed: [Left: No] [Right: No] Edema: [Left: N] [Right: o] Vascular Assessment Pulses: Dorsalis Pedis Palpable: [Right:Yes] Electronic Signature(s) Signed: 12/20/2021 12:56:33 PM By: Levora Dredge Entered By: Levora Dredge on 12/19/2021 15:24:56 [name] ([id number]) -------------------------------------------------------------------------------- Multi Wound Chart Details Patient Name: [name]. Date of Service: 12/19/2021 3:00 PM Medical Record Number: [id number] Patient Account Number: [id number] Date of Birth/Sex: March 04, 1941 (80 y.o. F) Treating RN: Levora Dredge Primary Care Evanee Lubrano: Frazier Richards Other Clinician: Referring Kyreese Chio: Frazier Richards Treating Andree Heeg/Extender: Skipper Cliche in Treatment: 12 Vital Signs Height(in): 65 Pulse(bpm): 70 Weight(lbs): 98 Blood Pressure(mmHg): 110/56 Body Mass Index(BMI): 16 Temperature(F): 97.4 Respiratory Rate(breaths/min): 18 Photos: [N/A:N/A] Wound Location: Right, Lateral Malleolus N/A  N/A Wounding Event: Gradually Appeared N/A N/A Primary Etiology: Diabetic Wound/Ulcer of the Lower N/A N/A Extremity Comorbid History: Chronic Obstructive Pulmonary N/A N/A Disease (COPD), Congestive Heart Failure, Type II Diabetes Date Acquired: 05/21/2021 N/A N/A Weeks of Treatment: 12 N/A N/A Wound Status: Healed - Epithelialized N/A N/A Measurements L x W x D (cm) 0x0x0 N/A N/A Area (cm) : 0 N/A N/A Volume (cm) : 0 N/A N/A % Reduction in Area: 100.00% N/A N/A % Reduction in Volume: 100.00% N/A N/A Classification: Grade 1 N/A N/A Exudate Amount: Small N/A N/A Exudate Type: Serosanguineous N/A N/A Exudate Color: red, brown N/A N/A Granulation Amount: None Present (0%) N/A N/A  Necrotic Amount: Large (67-100%) N/A N/A Exposed Structures: Fascia: No N/A N/A Fat Layer (Subcutaneous Tissue): No Tendon: No Muscle: No Joint: No Bone: No Epithelialization: None N/A N/A Treatment Notes Electronic Signature(s) Signed: 12/20/2021 12:56:33 PM By: Levora Dredge Entered By: Levora Dredge on 12/19/2021 15:30:35 [name] ([id number]) -------------------------------------------------------------------------------- [place] Details Patient Name: [name]. Date of Service: 12/19/2021 3:00 PM Medical Record Number: [id number] Patient Account Number: [id number] Date of Birth/Sex: 1941/05/06 (80 y.o. F) Treating RN: Levora Dredge Primary Care Esthela Brandner: Frazier Richards Other Clinician: Referring Brenton Joines: Frazier Richards Treating Desare Duddy/Extender: Skipper Cliche in Treatment: 12 Active Inactive Electronic Signature(s) Signed: 12/20/2021 12:56:33 PM By: Levora Dredge Entered By: Levora Dredge on 12/19/2021 15:30:14 [name] ([id number]) -------------------------------------------------------------------------------- Pain Assessment Details Patient Name: [name]. Date of Service: 12/19/2021 3:00 PM Medical  Record Number: [id number] Patient Account Number: [id number] Date of Birth/Sex: [birth year] (80 y.o. F) Treating RN: Levora Dredge Primary Care Odilon Cass: Frazier Richards Other Clinician: Referring Stavroula Rohde: Frazier Richards Treating Witt Plitt/Extender: Skipper Cliche in Treatment: 12 Active Problems Location of Pain Severity and Description of Pain Patient Has Paino No Site Locations Rate the pain. Current Pain Level: 0 Pain Management and Medication Current Pain Management: Electronic Signature(s) Signed: 12/20/2021 12:56:33 PM By: Levora Dredge Entered By: Levora Dredge on 12/19/2021 15:17:03 [name] ([id number]) -------------------------------------------------------------------------------- Patient/Caregiver Education Details Patient Name: [name]. Date of Service: 12/19/2021 3:00 PM Medical Record Number: [id number] Patient Account Number: [id number] Date of Birth/Gender: Dec 29, 1941 (80 y.o. F) Treating RN: Levora Dredge Primary Care Physician: Frazier Richards Other Clinician: Referring Physician: Frazier Richards Treating Physician/Extender: Skipper Cliche in Treatment: 12 Education Assessment Education Provided To: Patient and Caregiver Education Topics Provided Safety: Handouts: Safety, Other: protecting healed wound Methods: Explain/Verbal Responses: State content correctly Electronic Signature(s) Signed: 12/20/2021 12:56:33 PM By: Levora Dredge Entered By: Levora Dredge on 12/19/2021 15:36:33 [name] ([id number]) -------------------------------------------------------------------------------- Wound Assessment Details Patient Name: [name]. Date of Service: 12/19/2021 3:00 PM Medical Record Number: [id number] Patient Account Number: [id number] Date of Birth/Sex: [birth year] (80 y.o. F) Treating RN: Levora Dredge Primary Care Parlee Amescua: Frazier Richards Other Clinician: Referring Chrishawn Kring:  Frazier Richards Treating Filicia Scogin/Extender: Skipper Cliche in Treatment: 12 Wound Status Wound Number: 1 Primary Diabetic Wound/Ulcer of the Lower Extremity Etiology: Wound Location: Right, Lateral Malleolus Wound Healed - Epithelialized Wounding Event: Gradually Appeared Status: Date Acquired: 05/21/2021 Comorbid Chronic Obstructive Pulmonary Disease (COPD), Weeks Of Treatment: 12 History: Congestive Heart Failure, Type II Diabetes Clustered Wound: No Photos Wound Measurements Length: (cm) 0 % Re Width: (cm) 0 % Re Depth: (cm) 0 Epit Area: (cm) 0 Volume: (cm) 0 duction in Area: 100% duction in Volume: 100% helialization: None Wound Description Classification: Grade 1 Fou Exudate Amount: Small Slo Exudate Type: Serosanguineous Exudate Color: red, brown l Odor After Cleansing: No ugh/Fibrino Yes Wound Bed Granulation Amount: None Present (0%) Exposed Structure Necrotic Amount: Large (67-100%) Fascia Exposed: No Necrotic Quality: Adherent Slough Fat Layer (Subcutaneous Tissue) Exposed: No Tendon Exposed: No Muscle Exposed: No Joint Exposed: No Bone Exposed: No Treatment Notes Wound #1 (Malleolus) Wound Laterality: Right, Lateral Cleanser Peri-Wound Care Topical Primary Dressing AMORINA, DOERR ([id number]) Secondary Dressing Secured With Compression Wrap Compression Stockings Add-Ons Electronic Signature(s) Signed: 12/20/2021 12:56:33 PM By: Levora Dredge Entered By: Levora Dredge on 12/19/2021 15:28:03 [name] ([id number]) -------------------------------------------------------------------------------- Vitals Details Patient Name: [name]. Date of Service: 12/19/2021 3:00 PM Medical Record Number: [id number] Patient Account Number: [id number] Date of Birth/Sex:  Nov 30, 1941 (80 y.o. F) Treating RN: Levora Dredge Primary Care Ammon Muscatello: Frazier Richards Other Clinician: Referring Winnifred Dufford: Frazier Richards Treating  Amaura Authier/Extender: Skipper Cliche in Treatment: 12 Vital Signs Time Taken: 15:14 Temperature (F): 97.4 Height (in): 65 Pulse (bpm): 70 Weight (lbs): 98 Respiratory Rate (breaths/min): 18 Body Mass Index (BMI): 16.3 Blood Pressure (mmHg): 110/56 Reference Range: 80 - 120 mg / dl Electronic Signature(s) Signed: 12/20/2021 12:56:33 PM By: Levora Dredge Entered By: Levora Dredge on 12/19/2021 15:16:50

## 2021-12-19 NOTE — Progress Notes (Addendum)
CHERESE, LOZANO ([id number]) Visit Report for 12/19/2021 Chief Complaint Document Details Patient Name: [name], [name]. Date of Service: 12/19/2021 3:00 PM Medical Record Number: [id number] Patient Account Number: [id number] Date of Birth/Sex: Nov 17, 1941 (80 y.o. F) Treating RN: Levora Dredge Primary Care Provider: Frazier Richards Other Clinician: Referring Provider: Frazier Richards Treating Provider/Extender: Skipper Cliche in Treatment: 12 Information Obtained from: Patient Chief Complaint Right lateral ankle ulcer and left hand skin tear Electronic Signature(s) Signed: 12/19/2021 3:05:01 PM By: Worthy Keeler PA-C Entered By: Worthy Keeler on 12/19/2021 15:05:00 [name] ([id number]) -------------------------------------------------------------------------------- HPI Details Patient Name: [name]. Date of Service: 12/19/2021 3:00 PM Medical Record Number: [id number] Patient Account Number: [id number] Date of Birth/Sex: [birth year] (80 y.o. F) Treating RN: Levora Dredge Primary Care Provider: Frazier Richards Other Clinician: Referring Provider: Frazier Richards Treating Provider/Extender: Skipper Cliche in Treatment: 12 History of Present Illness HPI Description: 09/21/2021 upon evaluation today patient appears to be doing somewhat poorly in regard to her wound on her right lateral ankle. She has been tolerating the dressing changes without complication. Fortunately there does not not appear to be any signs of active infection systemically at this time which is great news. Likely also do not see any major issues which is good news. Nonetheless the biggest problem is that even with attempting to treat this and try to get things under control on her own she is really not been able to get this healed. She does tend to sleep on her right side exclusively due to the fact that she is not able to lay any other way due to pain. She has issues  with her right hip. She is a diabetic her last hemoglobin A1c was 5.6 however this should not be affecting her ability to heal she also had an MRI in July which was negative for osteomyelitis. Overall everything checks out okay but nonetheless she is still experiencing the open wound that she just cannot get closed. She does wear oxygen 24/7. She has a history of congestive heart failure, COPD, and again the diabetes mellitus type 2. 09/29/2021 upon evaluation today patient appears to be doing roughly the same medial movement better as far as some of the necrotic tissue on the surface of the wound is concerned it is kind of gently coming off little by little. With that being said I do not see any signs of active infection systemically at this point which is good news. 10/06/2021 upon evaluation today patient appears to be doing excellent in regard to her wound from an infection standpoint I do not see any issues here currently. Unfortunately she continues develop slough over the surface of the wound where using collagen I am not certain that skin to be the best way to go going forward. I really think she may do well with Iodoflex. 10/13/2021 upon evaluation today patient appears to be doing well with regard to her wound. I actually feel like this is showing signs of improvement with regard to the overall appearance I do believe the Iodoflex has been helpful. 10/20/2021 upon evaluation today patient appears to be doing a little better in regard to her ankle ulcer. With that being said she has a new wound on her hand as well due to skin tear that occurred on the way home last week after I saw her. Fortunately there does not appear to be any evidence of active infection at this time. No fevers, chills, nausea, vomiting, or diarrhea. 10/27/2021 upon evaluation today  10/27/2021 upon evaluation today patient appears to be doing better in regard to the wound on the hand as well as the ankle both are showing  signs of being smaller which is great news. I do not see any signs of infection which is also great news. In general I think that we are headed in the appropriate direction. 11/03/21 upon evaluation today patient appears to be doing well with regard to her wounds. She has been tolerating the dressing changes without complication. Fortunately there does not appear to be any signs of active infection at this time which is great news. No fevers, chills, nausea, vomiting, or diarrhea. 11/20/2021 upon evaluation today patient appears to be doing decently well in regard to her wounds. This is good news. Fortunately the hand is almost completely healed and the foot is smaller although this week it is about the same compared to what we saw previous. I do not see any evidence of infection there is little bit of dry skin we will continue to remove this is still taking a little bit longer than what I would expect to heal this makes me concerned about the fact of possibly needing to look into a more formal arterial study with regard to her arterial flow to see what they stand. 11/27/2021 upon evaluation today patient appears to be doing well with regard to her wound although is not significantly smaller is also not any bigger which is good news. 12/05/2021 upon evaluation today patient's wound is actually showing signs of improvement and looks to be doing quite well at about half the size for at least 60% of the size that it was last week. Overall I am extremely pleased with that improvement. 12/19/2021 upon evaluation today patient actually appears to be healed based on what we are seeing I do not see any signs of active infection at this time. No fevers, chills, nausea, vomiting, or diarrhea. Electronic Signature(s) Signed: 12/19/2021 3:30:11 PM By: Worthy Keeler PA-C Entered By: Worthy Keeler on 12/19/2021 15:30:11 [name]  ([id number]) -------------------------------------------------------------------------------- Physical Exam Details Patient Name: [name]. Date of Service: 12/19/2021 3:00 PM Medical Record Number: [id number] Patient Account Number: [id number] Date of Birth/Sex: May 13, 1941 (80 y.o. F) Treating RN: Levora Dredge Primary Care Provider: Frazier Richards Other Clinician: Referring Provider: Frazier Richards Treating Provider/Extender: Skipper Cliche in Treatment: 6 Constitutional Well-nourished and well-hydrated in no acute distress. Respiratory normal breathing without difficulty. Psychiatric this patient is able to make decisions and demonstrates good insight into disease process. Alert and Oriented x 3. pleasant and cooperative. Notes Upon inspection patient's wound bed actually showed signs of good granulation and epithelization at this point. Overall I do not see any signs that anything is worsening and I am extremely pleased with where we stand today. Electronic Signature(s) Signed: 12/19/2021 3:30:27 PM By: Worthy Keeler PA-C Entered By: Worthy Keeler on 12/19/2021 15:30:26 [name] ([id number]) -------------------------------------------------------------------------------- Physician Orders Details Patient Name: [name]. Date of Service: 12/19/2021 3:00 PM Medical Record Number: [id number] Patient Account Number: [id number] Date of Birth/Sex: 04/04/1941 (80 y.o. F) Treating RN: Levora Dredge Primary Care Provider: Frazier Richards Other Clinician: Referring Provider: Frazier Richards Treating Provider/Extender: Skipper Cliche in Treatment: 12 Verbal / Phone Orders: No Diagnosis Coding ICD-10 Coding Code Description E11.622 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 S61.412A Laceration without foreign body of left hand, initial encounter J44.9  Chronic obstructive  pulmonary disease, unspecified I50.42 Chronic combined systolic (congestive) and diastolic (congestive) heart failure Z99.81 Dependence on supplemental oxygen Follow-up Appointments o Nurse Visit as needed Bathing/ Shower/ Hygiene o Wash wounds with antibacterial soap and water. Edema Control - Lymphedema / Segmental Compressive Device / Other o Elevate leg(s) parallel to the floor when sitting. o Other: - cover healed wound with protective dressing during the day for 1-2 weeks for protection, take off at night Additional Orders / Instructions o Follow Nutritious Diet and Increase Protein Intake Oxygen Administration o Pulse Oxymetry measurement in clinic - PRN Electronic Signature(s) Signed: 12/19/2021 4:40:54 PM By: Worthy Keeler PA-C Signed: 12/20/2021 12:56:33 PM By: Levora Dredge Entered By: Levora Dredge on 12/19/2021 15:35:13 [name] ([id number]) -------------------------------------------------------------------------------- Problem List Details Patient Name: [name]. Date of Service: 12/19/2021 3:00 PM Medical Record Number: [id number] Patient Account Number: [id number] Date of Birth/Sex: 1941-10-25 (80 y.o. F) Treating RN: Levora Dredge Primary Care Provider: Frazier Richards Other Clinician: Referring Provider: Frazier Richards Treating Provider/Extender: Skipper Cliche in Treatment: 12 Active Problems ICD-10 Encounter Code Description Active Date MDM Diagnosis E11.622 Type 2 diabetes mellitus with other skin ulcer 09/21/2021 No Yes L97.312 Non-pressure chronic ulcer of right ankle with fat layer exposed 09/21/2021 No Yes S61.412A Laceration without foreign body of left hand, initial encounter 10/20/2021 No Yes J44.9 Chronic obstructive pulmonary disease, unspecified 09/21/2021 No Yes I50.42 Chronic combined systolic (congestive) and diastolic (congestive) heart 09/21/2021 No Yes failure Z99.81 Dependence  on supplemental oxygen 09/21/2021 No Yes Inactive Problems Resolved Problems Electronic Signature(s) Signed: 12/19/2021 3:04:55 PM By: Worthy Keeler PA-C Entered By: Worthy Keeler on 12/19/2021 15:04:54 [name] ([id number]) -------------------------------------------------------------------------------- Progress Note Details Patient Name: [name]. Date of Service: 12/19/2021 3:00 PM Medical Record Number: [id number] Patient Account Number: [id number] Date of Birth/Sex: 1941-05-11 (80 y.o. F) Treating RN: Levora Dredge Primary Care Provider: Frazier Richards Other Clinician: Referring Provider: Frazier Richards Treating Provider/Extender: Skipper Cliche in Treatment: 12 Subjective Chief Complaint Information obtained from Patient Right lateral ankle ulcer and left hand skin tear History of Present Illness (HPI) 09/21/2021 upon evaluation today patient appears to be doing somewhat poorly in regard to her wound on her right lateral ankle. She has been tolerating the dressing changes without complication. Fortunately there does not not appear to be any signs of active infection systemically at this time which is great news. Likely also do not see any major issues which is good news. Nonetheless the biggest problem is that even with attempting to treat this and try to get things under control on her own she is really not been able to get this healed. She does tend to sleep on her right side exclusively due to the fact that she is not able to lay any other way due to pain. She has issues with her right hip. She is a diabetic her last hemoglobin A1c was 5.6 however this should not be affecting her ability to heal she also had an MRI in July which was negative for osteomyelitis. Overall everything checks out okay but nonetheless she is still experiencing the open wound that she just cannot get closed. She does wear oxygen 24/7. She has a history of congestive heart  failure, COPD, and again the diabetes mellitus type 2. 09/29/2021 upon evaluation today patient appears to be doing roughly the same medial movement better as far as some of the necrotic tissue on the surface of the wound is concerned it is kind of gently  coming off little by little. With that being said I do not see any signs of active infection systemically at this point which is good news. 10/06/2021 upon evaluation today patient appears to be doing excellent in regard to her wound from an infection standpoint I do not see any issues here currently. Unfortunately she continues develop slough over the surface of the wound where using collagen I am not certain that skin to be the best way to go going forward. I really think she may do well with Iodoflex. 10/13/2021 upon evaluation today patient appears to be doing well with regard to her wound. I actually feel like this is showing signs of improvement with regard to the overall appearance I do believe the Iodoflex has been helpful. 10/20/2021 upon evaluation today patient appears to be doing a little better in regard to her ankle ulcer. With that being said she has a new wound on her hand as well due to skin tear that occurred on the way home last week after I saw her. Fortunately there does not appear to be any evidence of active infection at this time. No fevers, chills, nausea, vomiting, or diarrhea. 10/27/2021 upon evaluation today 10/27/2021 upon evaluation today patient appears to be doing better in regard to the wound on the hand as well as the ankle both are showing signs of being smaller which is great news. I do not see any signs of infection which is also great news. In general I think that we are headed in the appropriate direction. 11/03/21 upon evaluation today patient appears to be doing well with regard to her wounds. She has been tolerating the dressing changes without complication. Fortunately there does not appear to be any signs of  active infection at this time which is great news. No fevers, chills, nausea, vomiting, or diarrhea. 11/20/2021 upon evaluation today patient appears to be doing decently well in regard to her wounds. This is good news. Fortunately the hand is almost completely healed and the foot is smaller although this week it is about the same compared to what we saw previous. I do not see any evidence of infection there is little bit of dry skin we will continue to remove this is still taking a little bit longer than what I would expect to heal this makes me concerned about the fact of possibly needing to look into a more formal arterial study with regard to her arterial flow to see what they stand. 11/27/2021 upon evaluation today patient appears to be doing well with regard to her wound although is not significantly smaller is also not any bigger which is good news. 12/05/2021 upon evaluation today patient's wound is actually showing signs of improvement and looks to be doing quite well at about half the size for at least 60% of the size that it was last week. Overall I am extremely pleased with that improvement. 12/19/2021 upon evaluation today patient actually appears to be healed based on what we are seeing I do not see any signs of active infection at this time. No fevers, chills, nausea, vomiting, or diarrhea. Objective Constitutional [name], [name] ([id number]) Well-nourished and well-hydrated in no acute distress. Vitals Time Taken: 3:14 PM, Height: 65 in, Weight: 98 lbs, BMI: 16.3, Temperature: 97.4 F, Pulse: 70 bpm, Respiratory Rate: 18 breaths/min, Blood Pressure: 110/56 mmHg. Respiratory normal breathing without difficulty. Psychiatric this patient is able to make decisions and demonstrates good insight into disease process. Alert and Oriented x 3. pleasant and cooperative.  General Notes: Upon inspection patient's wound bed actually showed signs of good granulation and epithelization at  this point. Overall I do not see any signs that anything is worsening and I am extremely pleased with where we stand today. Integumentary (Hair, Skin) Wound #1 status is Healed - Epithelialized. Original cause of wound was Gradually Appeared. The date acquired was: 05/21/2021. The wound has been in treatment 12 weeks. The wound is located on the Right,Lateral Malleolus. The wound measures 0cm length x 0cm width x 0cm depth; 0cm^2 area and 0cm^3 volume. There is a small amount of serosanguineous drainage noted. There is no granulation within the wound bed. There is a large (67-100%) amount of necrotic tissue within the wound bed including Adherent Slough. 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 Would recommend that we discontinue wound care services at this point patient is in agreement with plan. We no longer have to use any of the Xeroform currently. 2. I am going to suggest that the patient go ahead and continue with the protective Band-Aid over the area just to keep it from breaking down and protected over the next 1-2 weeks after that she can just leave it open to air. She should be taking this off at bedtime despite and on first thing in the morning. We will see her back for a follow-up visit as needed. Electronic Signature(s) Signed: 12/19/2021 3:31:12 PM By: Worthy Keeler PA-C Entered By: Worthy Keeler on 12/19/2021 15:31:12 [name] ([id number]) -------------------------------------------------------------------------------- SuperBill Details Patient Name: [name]. Date of Service: 12/19/2021 Medical Record Number:  [id number] Patient Account Number: [id number] Date of Birth/Sex: [birth year] (80 y.o. F) Treating RN: Levora Dredge Primary Care Provider: Frazier Richards Other Clinician: Referring Provider: Frazier Richards Treating Provider/Extender: Skipper Cliche in Treatment: 12 Diagnosis Coding ICD-10 Codes Code Description E11.622 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 S61.412A Laceration without foreign body of left hand, initial encounter J44.9 Chronic obstructive pulmonary disease, unspecified I50.42 Chronic combined systolic (congestive) and diastolic (congestive) heart failure Z99.81 Dependence on supplemental oxygen Facility Procedures CPT4 Code: [id number] Description: [id number] - WOUND CARE VISIT-LEV 2 EST PT Modifier: Quantity: 1 Physician Procedures CPT4 Code: [id number] Description: 99213 - WC PHYS LEVEL 3 - EST PT Modifier: Quantity: 1 CPT4 Code: Description: ICD-10 Diagnosis Description E11.622 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 S61.412A Laceration without foreign body of left hand, initial encounter J44.9  Chronic obstructive pulmonary disease, unspecified Modifier: Quantity: Electronic Signature(s) Signed: 12/20/2021 9:59:39 AM By: Gretta Cool, BSN, RN, CWS, Kim RN, BSN Previous Signature: 12/19/2021 4:40:54 PM Version By: Worthy Keeler PA-C Previous Signature: 12/19/2021 3:31:48 PM Version By: Worthy Keeler PA-C Entered By: Gretta Cool, BSN, RN, CWS, Kim on 12/20/2021 09:59:39

## 2021-12-26 ENCOUNTER — Encounter: Payer: PPO | Admitting: Internal Medicine

## 2022-01-02 DIAGNOSIS — I509 Heart failure, unspecified: Secondary | ICD-10-CM | POA: Diagnosis not present

## 2022-01-02 DIAGNOSIS — Z681 Body mass index (BMI) 19 or less, adult: Secondary | ICD-10-CM | POA: Diagnosis not present

## 2022-01-02 DIAGNOSIS — Z9981 Dependence on supplemental oxygen: Secondary | ICD-10-CM | POA: Diagnosis not present

## 2022-01-02 DIAGNOSIS — Z87891 Personal history of nicotine dependence: Secondary | ICD-10-CM | POA: Diagnosis not present

## 2022-01-02 DIAGNOSIS — E46 Unspecified protein-calorie malnutrition: Secondary | ICD-10-CM | POA: Diagnosis not present

## 2022-01-02 DIAGNOSIS — N183 Chronic kidney disease, stage 3 unspecified: Secondary | ICD-10-CM | POA: Diagnosis not present

## 2022-01-02 DIAGNOSIS — J449 Chronic obstructive pulmonary disease, unspecified: Secondary | ICD-10-CM | POA: Diagnosis not present

## 2022-01-02 DIAGNOSIS — Z872 Personal history of diseases of the skin and subcutaneous tissue: Secondary | ICD-10-CM | POA: Diagnosis not present

## 2022-02-22 DIAGNOSIS — I5032 Chronic diastolic (congestive) heart failure: Secondary | ICD-10-CM | POA: Diagnosis not present

## 2022-02-22 DIAGNOSIS — E038 Other specified hypothyroidism: Secondary | ICD-10-CM | POA: Diagnosis not present

## 2022-02-22 DIAGNOSIS — E785 Hyperlipidemia, unspecified: Secondary | ICD-10-CM | POA: Diagnosis not present

## 2022-02-22 DIAGNOSIS — F325 Major depressive disorder, single episode, in full remission: Secondary | ICD-10-CM | POA: Diagnosis not present

## 2022-02-22 DIAGNOSIS — E1169 Type 2 diabetes mellitus with other specified complication: Secondary | ICD-10-CM | POA: Diagnosis not present

## 2022-02-22 DIAGNOSIS — N1831 Chronic kidney disease, stage 3a: Secondary | ICD-10-CM | POA: Diagnosis not present

## 2022-02-22 DIAGNOSIS — E43 Unspecified severe protein-calorie malnutrition: Secondary | ICD-10-CM | POA: Diagnosis not present

## 2022-02-22 DIAGNOSIS — I10 Essential (primary) hypertension: Secondary | ICD-10-CM | POA: Diagnosis not present

## 2022-02-22 DIAGNOSIS — M25551 Pain in right hip: Secondary | ICD-10-CM | POA: Diagnosis not present

## 2022-02-22 DIAGNOSIS — J9611 Chronic respiratory failure with hypoxia: Secondary | ICD-10-CM | POA: Diagnosis not present

## 2022-02-22 DIAGNOSIS — E1122 Type 2 diabetes mellitus with diabetic chronic kidney disease: Secondary | ICD-10-CM | POA: Diagnosis not present

## 2022-02-22 DIAGNOSIS — J42 Unspecified chronic bronchitis: Secondary | ICD-10-CM | POA: Diagnosis not present

## 2022-03-01 DIAGNOSIS — M47816 Spondylosis without myelopathy or radiculopathy, lumbar region: Secondary | ICD-10-CM | POA: Diagnosis not present

## 2022-03-01 DIAGNOSIS — M869 Osteomyelitis, unspecified: Secondary | ICD-10-CM | POA: Diagnosis not present

## 2022-03-01 DIAGNOSIS — M1611 Unilateral primary osteoarthritis, right hip: Secondary | ICD-10-CM | POA: Diagnosis not present

## 2022-03-01 DIAGNOSIS — M25551 Pain in right hip: Secondary | ICD-10-CM | POA: Diagnosis not present

## 2022-03-01 DIAGNOSIS — L97311 Non-pressure chronic ulcer of right ankle limited to breakdown of skin: Secondary | ICD-10-CM | POA: Diagnosis not present

## 2022-03-08 DIAGNOSIS — Z87891 Personal history of nicotine dependence: Secondary | ICD-10-CM | POA: Diagnosis not present

## 2022-03-08 DIAGNOSIS — Z9981 Dependence on supplemental oxygen: Secondary | ICD-10-CM | POA: Diagnosis not present

## 2022-03-08 DIAGNOSIS — I509 Heart failure, unspecified: Secondary | ICD-10-CM | POA: Diagnosis not present

## 2022-03-08 DIAGNOSIS — J449 Chronic obstructive pulmonary disease, unspecified: Secondary | ICD-10-CM | POA: Diagnosis not present

## 2022-04-04 ENCOUNTER — Ambulatory Visit: Payer: PPO | Admitting: Internal Medicine

## 2022-04-04 ENCOUNTER — Encounter: Payer: Self-pay | Admitting: Internal Medicine

## 2022-04-04 VITALS — BP 124/60 | HR 62 | Temp 97.7°F | Ht 60.0 in | Wt 106.0 lb

## 2022-04-04 DIAGNOSIS — R0689 Other abnormalities of breathing: Secondary | ICD-10-CM

## 2022-04-04 DIAGNOSIS — R0602 Shortness of breath: Secondary | ICD-10-CM | POA: Diagnosis not present

## 2022-04-04 DIAGNOSIS — J449 Chronic obstructive pulmonary disease, unspecified: Secondary | ICD-10-CM

## 2022-04-04 NOTE — Patient Instructions (Addendum)
Continue inhalers as prescribed ?Continue oxygen as prescribed ?AVOID SECOND/THIRD HAND SMOKING ? ?

## 2022-04-04 NOTE — Progress Notes (Signed)
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 (24/7) ?Mild obesity ? ? ?DATA: ?Spirometry 2014: FEV1 0.78 liters (38% pred)  ?CT chest 07/06/13: mod - severe emphysema. Chronic RLL scarring ?CXR 05/12/15: Advanced chronic obstructive pulmonary disease with similar right basilar scarring. No acute findings ?CXR 04/04/17: No acute findings ? ? ?INTERVAL HISTORY: ?Last visit 02/26/19.  No major pulmonary events since that time. ? ? ?CC ?Follow-up COPD ?Follow-up chronic hypoxic respiratory failure ?Follow-up respiratory insufficiency ? ? ?HPI ? ?Patient continues to take Advair and Spiriva as prescribed  ?seems to be responding well to this regimen  ?Patient with multiple COPD exacerbations in the past ?End-stage COPD ?End-stage respiratory failure with hypoxia  ? ? ?No exacerbation at this time ?No evidence of heart failure at this time ?No evidence or signs of infection at this time ?No respiratory distress ?No fevers, chills, nausea, vomiting, diarrhea ?No evidence of lower extremity edema ?No evidence hemoptysis ? ? ? 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 ? ? ?Patient with  high risk for pulmonary complications for any type of procedure moving forward ? ?Patient with a history of acute diastolic heart failure with pulmonary edema and CHF exacerbation in the past ?Sees Dr. Ubaldo Glassing with cardiology ? ? ? ?Review of Systems: ? ?Gen:  Denies  fever, sweats, chills weight loss  ?HEENT: Denies blurred vision, double vision, ear pain, eye pain, hearing loss, nose bleeds, sore throat ?Cardiac:  No dizziness, chest pain or heaviness, chest tightness,edema, No JVD ?Resp:   No cough, -sputum production, +shortness of breath,-wheezing, -hemoptysis,  ? ?Other:  All other systems negative ? ? ? ? ?BP 124/60 (BP Location: Left Arm, Cuff Size: Normal)   Pulse 62   Temp 97.7 ?F (36.5  ?C) (Temporal)   Ht 5' (1.524 m)   Wt 106 lb (48.1 kg)   SpO2 91%   BMI 20.70 kg/m?  ? ? ?Physical Examination:  ? ?General Appearance: No distress  ?EYES PERRLA, EOM intact.   ?NECK Supple, No JVD ?Pulmonary: normal breath sounds, No wheezing.  ?CardiovascularNormal S1,S2.  No m/r/g.   ?ALL OTHER ROS ARE NEGATIVE ? ? ? ?ALL OTHER ROS ARE NEGATIVE ? ? ? ? ? ?Current Outpatient Medications:  ?  acetaminophen (TYLENOL) 500 MG tablet, Take 500 mg by mouth every 6 (six) hours as needed. 2 in am and 2 at night, Disp: , Rfl:  ?  albuterol (PROAIR HFA) 108 (90 Base) MCG/ACT inhaler, Inhale 2 puffs into the lungs every 6 (six) hours as needed for wheezing or shortness of breath., Disp: 54 g, Rfl: 1 ?  atorvastatin (LIPITOR) 20 MG tablet, Take 20 mg by mouth daily., Disp: , Rfl:  ?  carvedilol (COREG) 3.125 MG tablet, Take 2 tablets (6.25 mg total) by mouth 2 (two) times daily with a meal., Disp: 30 tablet, Rfl: 1 ?  docusate sodium (COLACE) 100 MG capsule, Take 1 capsule (100 mg total) by mouth every other day., Disp: 10 capsule, Rfl: 0 ?  ferrous sulfate 325 (65 FE) MG tablet, Take 1 tablet (325 mg total) by mouth every other day., Disp: 30 tablet, Rfl: 3 ?  Fluticasone-Salmeterol (ADVAIR DISKUS) 250-50 MCG/DOSE AEPB, INHALE 1 PUFF BY MOUTH INTO THE LUNGS TWICE DAILY, Disp: 180 each, Rfl: 3 ?  mirtazapine (REMERON) 7.5 MG tablet, Take 7.5 mg by mouth at bedtime., Disp: , Rfl:  ?  pantoprazole (PROTONIX) 40 MG tablet, Take 1 tablet (40 mg total) by mouth 2 (two) times daily before a meal., Disp: 60 tablet, Rfl: 1 ?  potassium chloride (MICRO-K) 10 MEQ CR capsule, Take 1 capsule (10 mEq total) by mouth daily as needed (take potasium if you take torsemide.)., Disp: 10 capsule, Rfl: 0 ?  Tiotropium Bromide Monohydrate (SPIRIVA RESPIMAT) 2.5 MCG/ACT AERS, INHALE 2 PUFFS INTO THE LUNGS DAILY, Disp: 12 g, Rfl: 3 ?  torsemide (DEMADEX) 20 MG tablet, Take 20 mg by mouth daily as needed., Disp: , Rfl:  ?  vitamin B-12 1000 MCG  tablet, Take 1 tablet (1,000 mcg total) by mouth daily., Disp: 30 tablet, Rfl: 2 ?  sertraline (ZOLOFT) 50 MG tablet, Take by mouth., Disp: , Rfl:  ?No current facility-administered medications for this visit. ? ?Facility-Administered Medications Ordered in Other Visits:  ?  albuterol (PROVENTIL) (2.5 MG/3ML) 0.083% nebulizer solution 2.5 mg, 2.5 mg, Nebulization, Once, Wilhelmina Mcardle, MD ? ? ? ? ?IMPRESSION: ? ?81 year old pleasant white female thin and frail with severe end-stage COPD with FEV1 of 30% 6 years ago Gold stage D with severe respiratory insufficiency chronic hypoxic respiratory failure from COPD and chronic bronchitis and emphysema  ? ? ?Severe end-stage COPD ?Gold stage D ?No exacerbation ?Patient has emphysema and chronic bronchitis ?+respiratory insufficiency ?Continue Advair and Spiriva as prescribed ?Albuterol as needed ?Avoid secondhand thirdhand smoking ? ?Chronic bronchitis and emphysema ?Pulmonary Hygiene continue flutter valve 10-15 times per day ? 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 ?diastolic heart failure with pulm edema and CHF exacerbation ?Follow-up with Dr. Billie Ruddy cardiology ? ?MEDICATION ADJUSTMENTS/LABS AND TESTS ORDERED: ?Continue inhalers as prescribed ?Continue oxygen as prescribed ?AVOID SECOND/THIRD HAND SMOKING ? ?CURRENT MEDICATIONS REVIEWED AT LENGTH WITH PATIENT TODAY ? ? ?Patient satisfied with Plan of action and management. All questions answered ? ?Follow-up in 6 months ? ?Total time spent 22 minutes  ? ? ?Surgical preop assessment ?Patient is a moderate to high risk for postop and Intra-Op complications due to her Lung disease ? ?I have discussed that there is always a increased risk Pulmonary Infection, increased chance of Respiratory Failure and Cardiac Arrest, increased chance of pneumothorax and collapsed lung, as well as increased Stroke  and Death. ?I have explained Risks to patient ?At this time, Patient is at optimal medical management. ? ? ?Patient has VERY HIGH  risk for postop complications ?Patient is optimized with her medical therapy ? ?Preoperative Pulmonary Risk Assessment ?Definite risk factors for post-operative pulmonary complications include age >49, COPD, CHF, ASA class >2, functional dependence, OSA, pulmonary HTN, baseline hypoxia, poor nutritional status, surgery >3 hours, emergency surgery and high-risk surgical sites (AAA, upper abdomen, thoracic, head/neck). ?- ABG is unlikely to aid in pre-op evaluation ? ?General Risk Reduction Strategies: ?- All patients warrant post-operative incentive spirometry. For those with obstruction, also consider flutter valve. ?- Early ambulation, PT/OT ?- DVT prophylaxis where appropriate ?- Adequate pain control without oversedation ? ? ? ? ? ?Corrin Parker, M.D.  ?[facility]  ?Medical Director [place] ?Medical Director [facility] Cardio-Pulmonary Department  ? ? ? ? ? ? ? ?

## 2022-04-20 ENCOUNTER — Other Ambulatory Visit: Payer: Self-pay

## 2022-04-20 MED ORDER — SPIRIVA RESPIMAT 2.5 MCG/ACT IN AERS: INHALATION_SPRAY | RESPIRATORY_TRACT | 3 refills | Status: DC

## 2022-04-24 DIAGNOSIS — Z01818 Encounter for other preprocedural examination: Secondary | ICD-10-CM | POA: Diagnosis not present

## 2022-04-24 DIAGNOSIS — R0602 Shortness of breath: Secondary | ICD-10-CM | POA: Diagnosis not present

## 2022-04-24 DIAGNOSIS — J9611 Chronic respiratory failure with hypoxia: Secondary | ICD-10-CM | POA: Diagnosis not present

## 2022-04-24 DIAGNOSIS — I10 Essential (primary) hypertension: Secondary | ICD-10-CM | POA: Diagnosis not present

## 2022-04-24 DIAGNOSIS — I5030 Unspecified diastolic (congestive) heart failure: Secondary | ICD-10-CM | POA: Diagnosis not present

## 2022-05-01 DIAGNOSIS — Z87891 Personal history of nicotine dependence: Secondary | ICD-10-CM | POA: Diagnosis not present

## 2022-05-01 DIAGNOSIS — J449 Chronic obstructive pulmonary disease, unspecified: Secondary | ICD-10-CM | POA: Diagnosis not present

## 2022-05-01 DIAGNOSIS — Z9981 Dependence on supplemental oxygen: Secondary | ICD-10-CM | POA: Diagnosis not present

## 2022-05-01 DIAGNOSIS — J9610 Chronic respiratory failure, unspecified whether with hypoxia or hypercapnia: Secondary | ICD-10-CM | POA: Diagnosis not present

## 2022-05-01 DIAGNOSIS — M1611 Unilateral primary osteoarthritis, right hip: Secondary | ICD-10-CM | POA: Diagnosis not present

## 2022-05-24 DIAGNOSIS — I10 Essential (primary) hypertension: Secondary | ICD-10-CM | POA: Diagnosis not present

## 2022-05-24 DIAGNOSIS — E1122 Type 2 diabetes mellitus with diabetic chronic kidney disease: Secondary | ICD-10-CM | POA: Diagnosis not present

## 2022-05-24 DIAGNOSIS — E785 Hyperlipidemia, unspecified: Secondary | ICD-10-CM | POA: Diagnosis not present

## 2022-05-24 DIAGNOSIS — I5032 Chronic diastolic (congestive) heart failure: Secondary | ICD-10-CM | POA: Diagnosis not present

## 2022-05-24 DIAGNOSIS — E1169 Type 2 diabetes mellitus with other specified complication: Secondary | ICD-10-CM | POA: Diagnosis not present

## 2022-05-24 DIAGNOSIS — N1831 Chronic kidney disease, stage 3a: Secondary | ICD-10-CM | POA: Diagnosis not present

## 2022-05-24 DIAGNOSIS — F325 Major depressive disorder, single episode, in full remission: Secondary | ICD-10-CM | POA: Diagnosis not present

## 2022-05-29 ENCOUNTER — Encounter: Payer: PPO | Attending: Physician Assistant | Admitting: Physician Assistant

## 2022-05-29 DIAGNOSIS — I110 Hypertensive heart disease with heart failure: Secondary | ICD-10-CM | POA: Diagnosis not present

## 2022-05-29 DIAGNOSIS — L97822 Non-pressure chronic ulcer of other part of left lower leg with fat layer exposed: Secondary | ICD-10-CM | POA: Insufficient documentation

## 2022-05-29 DIAGNOSIS — I5042 Chronic combined systolic (congestive) and diastolic (congestive) heart failure: Secondary | ICD-10-CM | POA: Diagnosis not present

## 2022-05-29 DIAGNOSIS — Z9981 Dependence on supplemental oxygen: Secondary | ICD-10-CM | POA: Diagnosis not present

## 2022-05-29 DIAGNOSIS — E11622 Type 2 diabetes mellitus with other skin ulcer: Secondary | ICD-10-CM | POA: Insufficient documentation

## 2022-05-29 DIAGNOSIS — J449 Chronic obstructive pulmonary disease, unspecified: Secondary | ICD-10-CM | POA: Diagnosis not present

## 2022-05-29 NOTE — Progress Notes (Signed)
[name], [name] ([id number]) Visit Report for 05/29/2022 Allergy List Details Patient Name: [name], MISCH. Date of Service: 05/29/2022 10:00 AM Medical Record Number: [id number] Patient Account Number: [id number] Date of Birth/Sex: September 05, 1941 (81 y.o. F) Treating RN: Levora Dredge Primary Care Provider: Frazier Richards Other Clinician: Referring Provider: Frazier Richards Treating Provider/Extender: Jeri Cos Weeks in Treatment: 0 Allergies Active Allergies Mucinex D Allergy Notes Electronic Signature(s) Signed: 05/29/2022 4:32:40 PM By: Levora Dredge Entered By: Levora Dredge on 05/29/2022 10:11:56 [name] ([id number]) -------------------------------------------------------------------------------- Arrival Information Details Patient Name: [name]. Date of Service: 05/29/2022 10:00 AM Medical Record Number: [id number] Patient Account Number: [id number] Date of Birth/Sex: Dec 28, 1941 (81 y.o. F) Treating RN: Levora Dredge Primary Care Provider: Frazier Richards Other Clinician: Referring Provider: Frazier Richards Treating Provider/Extender: Skipper Cliche in Treatment: 0 Visit Information Patient Arrived: Wheel Chair Arrival Time: 10:01 Accompanied By: grand daughter Transfer Assistance: EasyPivot Patient Lift Patient Identification Verified: Yes Secondary Verification Process Completed: Yes Patient Has Alerts: Yes Patient Alerts: ABI 11/30/21 R 1.13 L 1.38 History Since Last Visit Added or deleted any medications: No Any new allergies or adverse reactions: No Had a fall or experienced change in activities of daily living that may affect risk of falls: No Hospitalized since last visit: No Has Dressing in Place as Prescribed: Yes Electronic Signature(s) Signed: 05/29/2022 10:36:04 AM By: Levora Dredge Entered By: Levora Dredge on 05/29/2022 10:36:04 [name]  ([id number]) -------------------------------------------------------------------------------- Clinic Level of Care Assessment Details Patient Name: [name]. Date of Service: 05/29/2022 10:00 AM Medical Record Number: [id number] Patient Account Number: [id number] Date of Birth/Sex: 09-May-1941 (81 y.o. F) Treating RN: Levora Dredge Primary Care Provider: Frazier Richards Other Clinician: Referring Provider: Frazier Richards Treating Provider/Extender: Skipper Cliche in Treatment: 0 Clinic Level of Care Assessment Items TOOL 2 Quantity Score [] - Use when only an EandM is performed on the INITIAL visit 0 ASSESSMENTS - Nursing Assessment / Reassessment X - General Physical Exam (combine w/ comprehensive assessment (listed just below) when performed on new 1 20 pt. evals) X- 1 25 Comprehensive Assessment (HX, ROS, Risk Assessments, Wounds Hx, etc.) ASSESSMENTS - Wound and Skin Assessment / Reassessment X - Simple Wound Assessment / Reassessment - one wound 1 5 [] - 0 Complex Wound Assessment / Reassessment - multiple wounds [] - 0 Dermatologic / Skin Assessment (not related to wound area) ASSESSMENTS - Ostomy and/or Continence Assessment and Care [] - Incontinence Assessment and Management 0 [] - 0 Ostomy Care Assessment and Management (repouching, etc.) PROCESS - Coordination of Care X - Simple Patient / Family Education for ongoing care 1 15 [] - 0 Complex (extensive) Patient / Family Education for ongoing care X- 1 10 Staff obtains Programmer, systems, Records, Test Results / Process Orders [] - 0 Staff telephones HHA, Nursing Homes / Clarify orders / etc [] - 0 Routine Transfer to another Facility (non-emergent condition) [] - 0 Routine Hospital Admission (non-emergent condition) X- 1 15 New Admissions / Biomedical engineer / Ordering NPWT, Apligraf, etc. [] - 0 Emergency Hospital Admission (emergent condition) X- 1 10 Simple Discharge Coordination [] -  0 Complex (extensive) Discharge Coordination PROCESS - Special Needs [] - Pediatric / Minor Patient Management 0 [] - 0 Isolation Patient Management [] - 0 Hearing / Language / Visual special needs [] - 0 Assessment of Community assistance (transportation, D/C planning, etc.) [] - 0 Additional assistance / Altered mentation [] - 0 Support Surface(s) Assessment (bed, cushion, seat, etc.) INTERVENTIONS - Wound  Cleansing / Measurement X - Wound Imaging (photographs - any number of wounds) 1 5 [] - 0 Wound Tracing (instead of photographs) X- 1 5 Simple Wound Measurement - one wound [] - 0 Complex Wound Measurement - multiple wounds EMONNIE, CANNADY. ([id number]) X- 1 5 Simple Wound Cleansing - one wound [] - 0 Complex Wound Cleansing - multiple wounds INTERVENTIONS - Wound Dressings X - Small Wound Dressing one or multiple wounds 1 10 [] - 0 Medium Wound Dressing one or multiple wounds [] - 0 Large Wound Dressing one or multiple wounds [] - 0 Application of Medications - injection INTERVENTIONS - Miscellaneous [] - External ear exam 0 [] - 0 Specimen Collection (cultures, biopsies, blood, body fluids, etc.) [] - 0 Specimen(s) / Culture(s) sent or taken to Lab for analysis [] - 0 Patient Transfer (multiple staff / Civil Service fast streamer / Similar devices) [] - 0 Simple Staple / Suture removal (25 or less) [] - 0 Complex Staple / Suture removal (26 or more) [] - 0 Hypo / Hyperglycemic Management (close monitor of Blood Glucose) [] - 0 Ankle / Brachial Index (ABI) - do not check if billed separately Has the patient been seen at the hospital within the last three years: Yes Total Score: 125 Level Of Care: New/Established - Level 4 Electronic Signature(s) Signed: 05/29/2022 4:32:40 PM By: Levora Dredge Entered By: Levora Dredge on 05/29/2022 10:59:13 [name] ([id number]) -------------------------------------------------------------------------------- Encounter  Discharge Information Details Patient Name: [name]. Date of Service: 05/29/2022 10:00 AM Medical Record Number: [id number] Patient Account Number: [id number] Date of Birth/Sex: [birth year] (81 y.o. F) Treating RN: Levora Dredge Primary Care Samayah Novinger: Frazier Richards Other Clinician: Referring Krystle Oberman: Frazier Richards Treating Jarrin Staley/Extender: Skipper Cliche in Treatment: 0 Encounter Discharge Information Items Post Procedure Vitals Discharge Condition: Stable Temperature (F): 98.3 Ambulatory Status: Wheelchair Pulse (bpm): 61 Discharge Destination: Home Respiratory Rate (breaths/min): 18 Transportation: Private Auto Blood Pressure (mmHg): 107/79 Accompanied By: grand daughter Schedule Follow-up Appointment: Yes Clinical Summary of Care: Electronic Signature(s) Signed: 05/29/2022 4:32:40 PM By: Levora Dredge Entered By: Levora Dredge on 05/29/2022 11:00:34 [name] ([id number]) -------------------------------------------------------------------------------- Lower Extremity Assessment Details Patient Name: [name]. Date of Service: 05/29/2022 10:00 AM Medical Record Number: [id number] Patient Account Number: [id number] Date of Birth/Sex: 03-Nov-1941 (81 y.o. F) Treating RN: Levora Dredge Primary Care Marshal Schrecengost: Frazier Richards Other Clinician: Referring Koni Kannan: Frazier Richards Treating Felice Deem/Extender: Jeri Cos Weeks in Treatment: 0 Edema Assessment Assessed: [Left: No] [Right: No] Edema: [Left: N] [Right: o] Calf Left: Right: Point of Measurement: 31 cm From Medial Instep 30.4 cm Ankle Left: Right: Point of Measurement: 9 cm From Medial Instep 20 cm Vascular Assessment Pulses: Dorsalis Pedis Palpable: [Left:Yes] Electronic Signature(s) Signed: 05/29/2022 4:32:40 PM By: Levora Dredge Entered By: Levora Dredge on 05/29/2022 10:28:17 [name]  ([id number]) -------------------------------------------------------------------------------- Multi Wound Chart Details Patient Name: [name]. Date of Service: 05/29/2022 10:00 AM Medical Record Number: [id number] Patient Account Number: [id number] Date of Birth/Sex: 1941/10/30 (81 y.o. F) Treating RN: Levora Dredge Primary Care Junell Cullifer: Frazier Richards Other Clinician: Referring Dyneshia Baccam: Frazier Richards Treating Brilyn Tuller/Extender: Skipper Cliche in Treatment: 0 Vital Signs Height(in): 62 Pulse(bpm): 19 Weight(lbs): 104 Blood Pressure(mmHg): 107/79 Body Mass Index(BMI): 19 Temperature(F): 98.3 Respiratory Rate(breaths/min): 18 Photos: [N/A:N/A] Wound Location: Left, Proximal Lower Leg N/A N/A Wounding Event: Trauma N/A N/A Primary Etiology: Trauma, Other N/A N/A Comorbid History: Anemia, Chronic Obstructive N/A N/A Pulmonary Disease (COPD), Congestive Heart Failure, Type II Diabetes Date Acquired: 05/17/2022 N/A  N/A Weeks of Treatment: 0 N/A N/A Wound Status: Open N/A N/A Wound Recurrence: No N/A N/A Measurements L x W x D (cm) 3.8x1.7x0.1 N/A N/A Area (cm) : 5.074 N/A N/A Volume (cm) : 0.507 N/A N/A Classification: Full Thickness Without Exposed N/A N/A Support Structures Exudate Amount: Medium N/A N/A Exudate Type: Serosanguineous N/A N/A Exudate Color: red, brown N/A N/A Granulation Amount: Medium (34-66%) N/A N/A Granulation Quality: Red N/A N/A Necrotic Amount: Medium (34-66%) N/A N/A Exposed Structures: Fat Layer (Subcutaneous Tissue): N/A N/A Yes Epithelialization: Small (1-33%) N/A N/A Treatment Notes Electronic Signature(s) Signed: 05/29/2022 10:33:14 AM By: Levora Dredge Entered By: Levora Dredge on 05/29/2022 10:33:14 [name] ([id number]) -------------------------------------------------------------------------------- [place] Details Patient Name: [name]. Date of Service: 05/29/2022  10:00 AM Medical Record Number: [id number] Patient Account Number: [id number] Date of Birth/Sex: Jul 07, 1941 (81 y.o. F) Treating RN: Levora Dredge Primary Care Provider: Frazier Richards Other Clinician: Referring Provider: Frazier Richards Treating Provider/Extender: Skipper Cliche in Treatment: 0 Active Inactive Orientation to the Wound Care Program Nursing Diagnoses: Knowledge deficit related to the wound healing center program Goals: Patient/caregiver will verbalize understanding of the [facility] Program Date Initiated: 05/29/2022 Target Resolution Date: 06/05/2022 Goal Status: Active Interventions: Provide education on orientation to the wound center Notes: Wound/Skin Impairment Nursing Diagnoses: Impaired tissue integrity Knowledge deficit related to ulceration/compromised skin integrity Goals: Ulcer/skin breakdown will have a volume reduction of 30% by week 4 Date Initiated: 05/29/2022 Target Resolution Date: 06/26/2022 Goal Status: Active Ulcer/skin breakdown will have a volume reduction of 50% by week 8 Date Initiated: 05/29/2022 Target Resolution Date: 07/24/2022 Goal Status: Active Ulcer/skin breakdown will have a volume reduction of 80% by week 12 Date Initiated: 05/29/2022 Target Resolution Date: 08/21/2022 Goal Status: Active Ulcer/skin breakdown will heal within 14 weeks Date Initiated: 05/29/2022 Target Resolution Date: 09/04/2022 Goal Status: Active Interventions: Assess patient/caregiver ability to obtain necessary supplies Assess patient/caregiver ability to perform ulcer/skin care regimen upon admission and as needed Assess ulceration(s) every visit Provide education on ulcer and skin care Notes: Electronic Signature(s) Signed: 05/29/2022 10:33:04 AM By: Levora Dredge Entered By: Levora Dredge on 05/29/2022 10:33:04 [name] ([id number]) -------------------------------------------------------------------------------- Pain  Assessment Details Patient Name: [name]. Date of Service: 05/29/2022 10:00 AM Medical Record Number: [id number] Patient Account Number: [id number] Date of Birth/Sex: 08-27-1941 (81 y.o. F) Treating RN: Levora Dredge Primary Care Provider: Frazier Richards Other Clinician: Referring Provider: Frazier Richards Treating Provider/Extender: Skipper Cliche in Treatment: 0 Active Problems Location of Pain Severity and Description of Pain Patient Has Paino No Site Locations Rate the pain. Current Pain Level: 0 Pain Management and Medication Current Pain Management: Electronic Signature(s) Signed: 05/29/2022 4:32:40 PM By: Levora Dredge Entered By: Levora Dredge on 05/29/2022 10:10:12 [name] ([id number]) -------------------------------------------------------------------------------- Patient/Caregiver Education Details Patient Name: [name]. Date of Service: 05/29/2022 10:00 AM Medical Record Number: [id number] Patient Account Number: [id number] Date of Birth/Gender: 1941-06-08 (81 y.o. F) Treating RN: Levora Dredge Primary Care Physician: Frazier Richards Other Clinician: Referring Physician: Frazier Richards Treating Physician/Extender: Skipper Cliche in Treatment: 0 Education Assessment Education Provided To: Patient and Caregiver Education Topics Provided Welcome To [facility]: Handouts: Welcome To [facility] Methods: Explain/Verbal Responses: State content correctly Wound/Skin Impairment: Handouts: Caring for Your Ulcer Methods: Explain/Verbal Responses: State content correctly Electronic Signature(s) Signed: 05/29/2022 4:32:40 PM By: Levora Dredge Entered By: Levora Dredge on 05/29/2022 10:59:49 [name] ([id number]) -------------------------------------------------------------------------------- Wound Assessment Details Patient Name: [name],  Jasmine M. Date of Service: 05/29/2022 10:00  AM Medical Record Number: [id number] Patient Account Number: [id number] Date of Birth/Sex: 06/26/1941 (81 y.o. F) Treating RN: Levora Dredge Primary Care Joclyn Alsobrook: Frazier Richards Other Clinician: Referring Sansa Alkema: Frazier Richards Treating Chalisa Kobler/Extender: Skipper Cliche in Treatment: 0 Wound Status Wound Number: 3 Primary Trauma, Other Etiology: Wound Location: Left, Proximal Lower Leg Wound Open Wounding Event: Trauma Status: Date Acquired: 05/17/2022 Comorbid Anemia, Chronic Obstructive Pulmonary Disease (COPD), Weeks Of Treatment: 0 History: Congestive Heart Failure, Type II Diabetes Clustered Wound: No Photos Wound Measurements Length: (cm) 3.8 Width: (cm) 1.7 Depth: (cm) 0.1 Area: (cm) 5.074 Volume: (cm) 0.507 % Reduction in Area: % Reduction in Volume: Epithelialization: Small (1-33%) Tunneling: No Undermining: No Wound Description Classification: Full Thickness Without Exposed Support Structu Exudate Amount: Medium Exudate Type: Serosanguineous Exudate Color: red, brown res Foul Odor After Cleansing: No Slough/Fibrino Yes Wound Bed Granulation Amount: Medium (34-66%) Exposed Structure Granulation Quality: Red Fat Layer (Subcutaneous Tissue) Exposed: Yes Necrotic Amount: Medium (34-66%) Necrotic Quality: Adherent Slough Treatment Notes Wound #3 (Lower Leg) Wound Laterality: Left, Proximal Cleanser [place] Ancillary Kit - 15 Day Supply Discharge Instruction: Use supplies as instructed; Kit contains: (15) Saline Bullets; (15) 3x3 Gauze; 15 pr Gloves Soap and Water Discharge Instruction: Gently cleanse wound with antibacterial soap, rinse and pat dry prior to dressing wounds Peri-Wound Care Topical [name], [name] ([id number]) Primary Dressing Xeroform 5x9-HBD (in/in) Discharge Instruction: Apply Xeroform 5x9-HBD (in/in) as directed Secondary Dressing (SILCONE BORDER) Zetuvit Plus SILICONE BORDER Dressing 5x5 (in/in) Discharge Instruction:  Please do not put silicone bordered dressings under wraps. Use non-bordered dressing only. Secured With Compression Wrap Compression Stockings Add-Ons Electronic Signature(s) Signed: 05/29/2022 4:32:40 PM By: Levora Dredge Entered By: Levora Dredge on 05/29/2022 10:29:19 [name] ([id number]) -------------------------------------------------------------------------------- Vitals Details Patient Name: [name]. Date of Service: 05/29/2022 10:00 AM Medical Record Number: [id number] Patient Account Number: [id number] Date of Birth/Sex: [birth year] (81 y.o. F) Treating RN: Levora Dredge Primary Care Julena Barbour: Frazier Richards Other Clinician: Referring Zymarion Favorite: Frazier Richards Treating Harun Brumley/Extender: Skipper Cliche in Treatment: 0 Vital Signs Time Taken: 10:10 Temperature (F): 98.3 Height (in): 62 Pulse (bpm): 61 Source: Stated Respiratory Rate (breaths/min): 18 Weight (lbs): 104 Blood Pressure (mmHg): 107/79 Source: Stated Reference Range: 80 - 120 mg / dl Body Mass Index (BMI): 19 Electronic Signature(s) Signed: 05/29/2022 4:32:40 PM By: Levora Dredge Entered By: Levora Dredge on 05/29/2022 10:10:52

## 2022-05-29 NOTE — Progress Notes (Signed)
LILAH, MIJANGOS ([id number]) Visit Report for 05/29/2022 Abuse Risk Screen Details Patient Name: [name], [name]. Date of Service: 05/29/2022 10:00 AM Medical Record Number: [id number] Patient Account Number: [id number] Date of Birth/Sex: June 30, 1941 (81 y.o. F) Treating RN: Levora Dredge Primary Care Taji Barretto: Frazier Richards Other Clinician: Referring Deshonda Cryderman: Frazier Richards Treating Holton Sidman/Extender: Skipper Cliche in Treatment: 0 Abuse Risk Screen Items Answer ABUSE RISK SCREEN: Has anyone close to you tried to hurt or harm you recentlyo No Do you feel uncomfortable with anyone in your familyo No Has anyone forced you do things that you didnot want to doo No Electronic Signature(s) Signed: 05/29/2022 4:32:40 PM By: Levora Dredge Entered By: Levora Dredge on 05/29/2022 10:15:41 [name] ([id number]) -------------------------------------------------------------------------------- Activities of Daily Living Details Patient Name: [name]. Date of Service: 05/29/2022 10:00 AM Medical Record Number: [id number] Patient Account Number: [id number] Date of Birth/Sex: February 16, 1941 (81 y.o. F) Treating RN: Levora Dredge Primary Care Dyanara Cozza: Frazier Richards Other Clinician: Referring Starlynn Klinkner: Frazier Richards Treating Javani Spratt/Extender: Skipper Cliche in Treatment: 0 Activities of Daily Living Items Answer Activities of Daily Living (Please select one for each item) Drive Automobile Not Able Take Medications Completely Able Use Telephone Completely Able Care for Appearance Completely Able Use Toilet Completely Able Bath / Shower Completely Able Dress Self Completely Able Feed Self Completely Able Walk Need Assistance Get In / Out Bed Need Assistance Housework Need Assistance Prepare Meals Completely [place] for Self Need Assistance Electronic Signature(s) Signed: 05/29/2022 4:32:40 PM By: Levora Dredge Entered By: Levora Dredge on 05/29/2022 10:16:37 [name] ([id number]) -------------------------------------------------------------------------------- Education Screening Details Patient Name: [name]. Date of Service: 05/29/2022 10:00 AM Medical Record Number: [id number] Patient Account Number: [id number] Date of Birth/Sex: [birth year] (81 y.o. F) Treating RN: Levora Dredge Primary Care Maan Zarcone: Frazier Richards Other Clinician: Referring Zadia Uhde: Frazier Richards Treating Tamiko Leopard/Extender: Skipper Cliche in Treatment: 0 Learning Preferences/Education Level/Primary Language Learning Preference: Explanation, Demonstration, Video, Communication Board, Printed Material Preferred Language: English Cognitive Barrier Language Barrier: No Translator Needed: No Memory Deficit: No Emotional Barrier: No Cultural/Religious Beliefs Affecting Medical Care: No Physical Barrier Impaired Vision: No Impaired Hearing: No Decreased Hand dexterity: No Knowledge/Comprehension Knowledge Level: High Comprehension Level: High Ability to understand written instructions: High Ability to understand verbal instructions: High Motivation Anxiety Level: Calm Cooperation: Cooperative Education Importance: Acknowledges Need Interest in Health Problems: Asks Questions Perception: Coherent Willingness to Engage in Self-Management High Activities: Readiness to Engage in Self-Management High Activities: Electronic Signature(s) Signed: 05/29/2022 4:32:40 PM By: Levora Dredge Entered By: Levora Dredge on 05/29/2022 10:16:56 [name] ([id number]) -------------------------------------------------------------------------------- Fall Risk Assessment Details Patient Name: [name]. Date of Service: 05/29/2022 10:00 AM Medical Record Number: [id number] Patient Account Number: [id number] Date of Birth/Sex: [birth year] (81 y.o. F) Treating RN: Levora Dredge Primary Care Mckensey Berghuis: Frazier Richards Other Clinician: Referring Kaliq Lege: Frazier Richards Treating Duilio Heritage/Extender: Skipper Cliche in Treatment: 0 Fall Risk Assessment Items Have you had 2 or more falls in the last 12 monthso 0 No Have you had any fall that resulted in injury in the last 12 monthso 0 No FALLS RISK SCREEN History of falling - immediate or within 3 months 25 Yes Secondary diagnosis (Do you have 2 or more medical diagnoseso) 0 No Ambulatory aid None/bed rest/wheelchair/nurse 0 No Crutches/cane/walker 15 Yes Furniture 0 No Intravenous therapy Access/Saline/Heparin Lock 0 No Gait/Transferring Normal/ bed rest/ wheelchair 0 No Weak (short steps with or without shuffle, stooped but able to  lift head while walking, may 10 Yes seek support from furniture) Impaired (short steps with shuffle, may have difficulty arising from chair, head down, impaired 0 No balance) Mental Status Oriented to own ability 0 Yes Electronic Signature(s) Signed: 05/29/2022 4:32:40 PM By: Levora Dredge Entered By: Levora Dredge on 05/29/2022 10:17:35 [name] ([id number]) -------------------------------------------------------------------------------- Foot Assessment Details Patient Name: [name]. Date of Service: 05/29/2022 10:00 AM Medical Record Number: [id number] Patient Account Number: [id number] Date of Birth/Sex: 15-Feb-1941 (81 y.o. F) Treating RN: Levora Dredge Primary Care Shireen Rayburn: Frazier Richards Other Clinician: Referring Taleigh Gero: Frazier Richards Treating Colin Ellers/Extender: Skipper Cliche in Treatment: 0 Foot Assessment Items Site Locations + = Sensation present, - = Sensation absent, C = Callus, U = Ulcer R = Redness, W = Warmth, M = Maceration, PU = Pre-ulcerative lesion F = Fissure, S = Swelling, D = Dryness Assessment Right: Left: Other Deformity: No No Prior Foot Ulcer: No No Prior Amputation: No No Charcot Joint:  No No Ambulatory Status: Ambulatory With Help Assistance Device: Walker Gait: Steady Electronic Signature(s) Signed: 05/29/2022 4:32:40 PM By: Levora Dredge Entered By: Levora Dredge on 05/29/2022 10:27:50 [name] ([id number]) -------------------------------------------------------------------------------- Nutrition Risk Screening Details Patient Name: [name]. Date of Service: 05/29/2022 10:00 AM Medical Record Number: [id number] Patient Account Number: [id number] Date of Birth/Sex: 1941/04/12 (81 y.o. F) Treating RN: Levora Dredge Primary Care Jillyn Stacey: Frazier Richards Other Clinician: Referring Aleeta Schmaltz: Frazier Richards Treating Turkessa Ostrom/Extender: Skipper Cliche in Treatment: 0 Height (in): 62 Weight (lbs): 104 Body Mass Index (BMI): 19 Nutrition Risk Screening Items Score Screening NUTRITION RISK SCREEN: I have an illness or condition that made me change the kind and/or amount of food I eat 0 No I eat fewer than two meals per day 0 No I eat few fruits and vegetables, or milk products 0 No I have three or more drinks of beer, liquor or wine almost every day 0 No I have tooth or mouth problems that make it hard for me to eat 0 No I don't always have enough money to buy the food I need 0 No I eat alone most of the time 0 No I take three or more different prescribed or over-the-counter drugs a day 0 No Without wanting to, I have lost or gained 10 pounds in the last six months 0 No I am not always physically able to shop, cook and/or feed myself 0 No Nutrition Protocols Good Risk Protocol 0 No interventions needed Moderate Risk Protocol High Risk Proctocol Risk Level: Good Risk Score: 0 Electronic Signature(s) Signed: 05/29/2022 4:32:40 PM By: Levora Dredge Entered By: Levora Dredge on 05/29/2022 10:17:44

## 2022-05-29 NOTE — Progress Notes (Signed)
ROCSI, HAZELBAKER ([id number]) Visit Report for 05/29/2022 Chief Complaint Document Details Patient Name: [name], [name]. Date of Service: 05/29/2022 10:00 AM Medical Record Number: [id number] Patient Account Number: [id number] Date of Birth/Sex: July 03, 1941 (81 y.o. F) Treating RN: Levora Dredge Primary Care Provider: Frazier Richards Other Clinician: Referring Provider: Frazier Richards Treating Provider/Extender: Skipper Cliche in Treatment: 0 Information Obtained from: Patient Chief Complaint Left LE Ulcer Electronic Signature(s) Signed: 05/29/2022 10:45:35 AM By: Worthy Keeler PA-C Entered By: Worthy Keeler on 05/29/2022 10:45:34 [name] ([id number]) -------------------------------------------------------------------------------- Debridement Details Patient Name: [name]. Date of Service: 05/29/2022 10:00 AM Medical Record Number: [id number] Patient Account Number: [id number] Date of Birth/Sex: 04-Oct-1941 (81 y.o. F) Treating RN: Levora Dredge Primary Care Provider: Frazier Richards Other Clinician: Referring Provider: Frazier Richards Treating Provider/Extender: Skipper Cliche in Treatment: 0 Debridement Performed for Wound #3 Left,Proximal Lower Leg Assessment: Performed By: Physician Tommie Sams., PA-C Debridement Type: Chemical/Enzymatic/Mechanical Agent Used: Gauze and saline Level of Consciousness (Pre- Awake and Alert procedure): Pre-procedure Verification/Time Out Yes - 10:55 Taken: Pain Control: Lidocaine 4% Topical Solution Instrument: Other : saline gauze Bleeding: None Response to Treatment: Procedure was tolerated well Level of Consciousness (Post- Awake and Alert procedure): Post Debridement Measurements of Total Wound Length: (cm) 3.8 Width: (cm) 1.7 Depth: (cm) 0.1 Volume: (cm) 0.507 Character of Wound/Ulcer Post Debridement: Stable Post Procedure Diagnosis Same as Pre-procedure Electronic  Signature(s) Signed: 05/29/2022 12:09:48 PM By: Worthy Keeler PA-C Signed: 05/29/2022 4:32:40 PM By: Levora Dredge Entered By: Levora Dredge on 05/29/2022 10:58:19 [name] ([id number]) -------------------------------------------------------------------------------- HPI Details Patient Name: [name]. Date of Service: 05/29/2022 10:00 AM Medical Record Number: [id number] Patient Account Number: [id number] Date of Birth/Sex: 09/25/1941 (81 y.o. F) Treating RN: Levora Dredge Primary Care Provider: Frazier Richards Other Clinician: Referring Provider: Frazier Richards Treating Provider/Extender: Skipper Cliche in Treatment: 0 History of Present Illness HPI Description: 09/21/2021 upon evaluation today patient appears to be doing somewhat poorly in regard to her wound on her right lateral ankle. She has been tolerating the dressing changes without complication. Fortunately there does not not appear to be any signs of active infection systemically at this time which is great news. Likely also do not see any major issues which is good news. Nonetheless the biggest problem is that even with attempting to treat this and try to get things under control on her own she is really not been able to get this healed. She does tend to sleep on her right side exclusively due to the fact that she is not able to lay any other way due to pain. She has issues with her right hip. She is a diabetic her last hemoglobin A1c was 5.6 however this should not be affecting her ability to heal she also had an MRI in July which was negative for osteomyelitis. Overall everything checks out okay but nonetheless she is still experiencing the open wound that she just cannot get closed. She does wear oxygen 24/7. She has a history of congestive heart failure, COPD, and again the diabetes mellitus type 2. 09/29/2021 upon evaluation today patient appears to be doing roughly the same medial movement better  as far as some of the necrotic tissue on the surface of the wound is concerned it is kind of gently coming off little by little. With that being said I do not see any signs of active infection systemically at this point which is good news. 10/06/2021 upon evaluation today patient  appears to be doing excellent in regard to her wound from an infection standpoint I do not see any issues here currently. Unfortunately she continues develop slough over the surface of the wound where using collagen I am not certain that skin to be the best way to go going forward. I really think she may do well with Iodoflex. 10/13/2021 upon evaluation today patient appears to be doing well with regard to her wound. I actually feel like this is showing signs of improvement with regard to the overall appearance I do believe the Iodoflex has been helpful. 10/20/2021 upon evaluation today patient appears to be doing a little better in regard to her ankle ulcer. With that being said she has a new wound on her hand as well due to skin tear that occurred on the way home last week after I saw her. Fortunately there does not appear to be any evidence of active infection at this time. No fevers, chills, nausea, vomiting, or diarrhea. 10/27/2021 upon evaluation today 10/27/2021 upon evaluation today patient appears to be doing better in regard to the wound on the hand as well as the ankle both are showing signs of being smaller which is great news. I do not see any signs of infection which is also great news. In general I think that we are headed in the appropriate direction. 11/03/21 upon evaluation today patient appears to be doing well with regard to her wounds. She has been tolerating the dressing changes without complication. Fortunately there does not appear to be any signs of active infection at this time which is great news. No fevers, chills, nausea, vomiting, or diarrhea. 11/20/2021 upon evaluation today patient appears to  be doing decently well in regard to her wounds. This is good news. Fortunately the hand is almost completely healed and the foot is smaller although this week it is about the same compared to what we saw previous. I do not see any evidence of infection there is little bit of dry skin we will continue to remove this is still taking a little bit longer than what I would expect to heal this makes me concerned about the fact of possibly needing to look into a more formal arterial study with regard to her arterial flow to see what they stand. 11/27/2021 upon evaluation today patient appears to be doing well with regard to her wound although is not significantly smaller is also not any bigger which is good news. 12/05/2021 upon evaluation today patient's wound is actually showing signs of improvement and looks to be doing quite well at about half the size for at least 60% of the size that it was last week. Overall I am extremely pleased with that improvement. 12/19/2021 upon evaluation today patient actually appears to be healed based on what we are seeing I do not see any signs of active infection at this time. No fevers, chills, nausea, vomiting, or diarrhea. Readmission: 05-29-2022 upon evaluation today patient appears to be doing well with regard to her wound. This is actually a new injury that she sustained when she had her shin hit on a metal chair that was beside her bed. Subsequently the patient tells me that this happened around May 18. They have been putting Vaseline on this and using the bordered foam dressings which has done decently well for her up to this point. With that being said she tells me she is having some discomfort but not nearly as bad as what she previously noted with her  hand. That is what I was seeing her previous. Patient's past medical history really has not changed everything remains essentially the same. She does have a history of congestive heart failure, COPD, and diabetes  mellitus type 2. She also does have oxygen 24/7. Her most recent hemoglobin A1c was on March 22 and was 6.3. Electronic Signature(s) Signed: 05/29/2022 11:12:15 AM By: Worthy Keeler PA-C Entered By: Worthy Keeler on 05/29/2022 11:12:15 [name] ([id number]) [name], [name] ([id number]) -------------------------------------------------------------------------------- Physical Exam Details Patient Name: [name]. Date of Service: 05/29/2022 10:00 AM Medical Record Number: [id number] Patient Account Number: [id number] Date of Birth/Sex: 02-01-1941 (81 y.o. F) Treating RN: Levora Dredge Primary Care Provider: Frazier Richards Other Clinician: Referring Provider: Frazier Richards Treating Provider/Extender: Skipper Cliche in Treatment: 0 Constitutional sitting or standing blood pressure is within target range for patient.. pulse regular and within target range for patient.Marland Kitchen respirations regular, non- labored and within target range for patient.Marland Kitchen temperature within target range for patient.. Well-nourished and well-hydrated in no acute distress. Eyes conjunctiva clear no eyelid edema noted. pupils equal round and reactive to light and accommodation. Ears, Nose, Mouth, and Throat no gross abnormality of ear auricles or external auditory canals. normal hearing noted during conversation. mucus membranes moist. Respiratory normal breathing without difficulty. Cardiovascular 2+ dorsalis pedis/posterior tibialis pulses. no clubbing, cyanosis, significant edema, <3 sec cap refill. Musculoskeletal normal gait and posture. no significant deformity or arthritic changes, no loss or range of motion, no clubbing. Psychiatric this patient is able to make decisions and demonstrates good insight into disease process. Alert and Oriented x 3. pleasant and cooperative. Notes Upon inspection patient's wound bed actually did not require any sharp debridement and seems to be healing  quite nicely. I do not see any evidence of active infection locally or systemically which is great news and overall I am extremely pleased with where we stand currently. Electronic Signature(s) Signed: 05/29/2022 11:12:37 AM By: Worthy Keeler PA-C Entered By: Worthy Keeler on 05/29/2022 11:12:37 [name] ([id number]) -------------------------------------------------------------------------------- Physician Orders Details Patient Name: [name]. Date of Service: 05/29/2022 10:00 AM Medical Record Number: [id number] Patient Account Number: [id number] Date of Birth/Sex: April 12, 1941 (81 y.o. F) Treating RN: Levora Dredge Primary Care Provider: Frazier Richards Other Clinician: Referring Provider: Frazier Richards Treating Provider/Extender: Skipper Cliche in Treatment: 0 Verbal / Phone Orders: No Diagnosis Coding ICD-10 Coding Code Description E11.622 Type 2 diabetes mellitus with other skin ulcer S81.812A Laceration without foreign body, left lower leg, initial encounter L97.822 Non-pressure chronic ulcer of other part of left lower leg with fat layer exposed J44.9 Chronic obstructive pulmonary disease, unspecified I50.42 Chronic combined systolic (congestive) and diastolic (congestive) heart failure Z99.81 Dependence on supplemental oxygen Follow-up Appointments o Return Appointment in 2 weeks. o Nurse Visit as needed Bathing/ Shower/ Hygiene o May shower; gently cleanse wound with antibacterial soap, rinse and pat dry prior to dressing wounds o No tub bath. Edema Control - Lymphedema / Segmental Compressive Device / Other o Elevate, Exercise Daily and Avoid Standing for Long Periods of Time. o Elevate leg(s) parallel to the floor when sitting. o DO YOUR BEST to sleep in the bed at night. DO NOT sleep in your recliner. Long hours of sitting in a recliner leads to swelling of the legs and/or potential wounds on your backside. Wound  Treatment Wound #3 - Lower Leg Wound Laterality: Left, Proximal Cleanser: [employer] Ancillary Kit - 15 Day Supply (DME) (Generic) 3 x Per Week/30  Days Discharge Instructions: Use supplies as instructed; Kit contains: (15) Saline Bullets; (15) 3x3 Gauze; 15 pr Gloves Cleanser: Soap and Water 3 x Per Week/30 Days Discharge Instructions: Gently cleanse wound with antibacterial soap, rinse and pat dry prior to dressing wounds Primary Dressing: Xeroform 5x9-HBD (in/in) (DME) (Generic) 3 x Per Week/30 Days Discharge Instructions: Apply Xeroform 5x9-HBD (in/in) as directed Secondary Dressing: (SILCONE BORDER) Zetuvit Plus SILICONE BORDER Dressing 5x5 (in/in) (DME) (Generic) 3 x Per Week/30 Days Discharge Instructions: Please do not put silicone bordered dressings under wraps. Use non-bordered dressing only. Electronic Signature(s) Signed: 05/29/2022 12:09:48 PM By: Worthy Keeler PA-C Signed: 05/29/2022 4:32:40 PM By: Levora Dredge Entered By: Levora Dredge on 05/29/2022 10:58:44 [name] ([id number]) -------------------------------------------------------------------------------- Problem List Details Patient Name: [name]. Date of Service: 05/29/2022 10:00 AM Medical Record Number: [id number] Patient Account Number: [id number] Date of Birth/Sex: 1941-08-05 (81 y.o. F) Treating RN: Levora Dredge Primary Care Provider: Frazier Richards Other Clinician: Referring Provider: Frazier Richards Treating Provider/Extender: Skipper Cliche in Treatment: 0 Active Problems ICD-10 Encounter Code Description Active Date MDM Diagnosis E11.622 Type 2 diabetes mellitus with other skin ulcer 05/29/2022 No Yes S81.812A Laceration without foreign body, left lower leg, initial encounter 05/29/2022 No Yes L97.822 Non-pressure chronic ulcer of other part of left lower leg with fat layer 05/29/2022 No Yes exposed J44.9 Chronic obstructive pulmonary disease, unspecified 05/29/2022 No  Yes I50.42 Chronic combined systolic (congestive) and diastolic (congestive) heart 05/29/2022 No Yes failure Z99.81 Dependence on supplemental oxygen 05/29/2022 No Yes Inactive Problems Resolved Problems Electronic Signature(s) Signed: 05/29/2022 10:45:02 AM By: Worthy Keeler PA-C Entered By: Worthy Keeler on 05/29/2022 10:45:01 [name] ([id number]) -------------------------------------------------------------------------------- Progress Note Details Patient Name: [name]. Date of Service: 05/29/2022 10:00 AM Medical Record Number: [id number] Patient Account Number: [id number] Date of Birth/Sex: 1941-10-18 (81 y.o. F) Treating RN: Levora Dredge Primary Care Provider: Frazier Richards Other Clinician: Referring Provider: Frazier Richards Treating Provider/Extender: Skipper Cliche in Treatment: 0 Subjective Chief Complaint Information obtained from Patient Left LE Ulcer History of Present Illness (HPI) 09/21/2021 upon evaluation today patient appears to be doing somewhat poorly in regard to her wound on her right lateral ankle. She has been tolerating the dressing changes without complication. Fortunately there does not not appear to be any signs of active infection systemically at this time which is great news. Likely also do not see any major issues which is good news. Nonetheless the biggest problem is that even with attempting to treat this and try to get things under control on her own she is really not been able to get this healed. She does tend to sleep on her right side exclusively due to the fact that she is not able to lay any other way due to pain. She has issues with her right hip. She is a diabetic her last hemoglobin A1c was 5.6 however this should not be affecting her ability to heal she also had an MRI in July which was negative for osteomyelitis. Overall everything checks out okay but nonetheless she is still experiencing the open wound that  she just cannot get closed. She does wear oxygen 24/7. She has a history of congestive heart failure, COPD, and again the diabetes mellitus type 2. 09/29/2021 upon evaluation today patient appears to be doing roughly the same medial movement better as far as some of the necrotic tissue on the surface of the wound is concerned it is kind of gently coming off little by  little. With that being said I do not see any signs of active infection systemically at this point which is good news. 10/06/2021 upon evaluation today patient appears to be doing excellent in regard to her wound from an infection standpoint I do not see any issues here currently. Unfortunately she continues develop slough over the surface of the wound where using collagen I am not certain that skin to be the best way to go going forward. I really think she may do well with Iodoflex. 10/13/2021 upon evaluation today patient appears to be doing well with regard to her wound. I actually feel like this is showing signs of improvement with regard to the overall appearance I do believe the Iodoflex has been helpful. 10/20/2021 upon evaluation today patient appears to be doing a little better in regard to her ankle ulcer. With that being said she has a new wound on her hand as well due to skin tear that occurred on the way home last week after I saw her. Fortunately there does not appear to be any evidence of active infection at this time. No fevers, chills, nausea, vomiting, or diarrhea. 10/27/2021 upon evaluation today 10/27/2021 upon evaluation today patient appears to be doing better in regard to the wound on the hand as well as the ankle both are showing signs of being smaller which is great news. I do not see any signs of infection which is also great news. In general I think that we are headed in the appropriate direction. 11/03/21 upon evaluation today patient appears to be doing well with regard to her wounds. She has been tolerating the  dressing changes without complication. Fortunately there does not appear to be any signs of active infection at this time which is great news. No fevers, chills, nausea, vomiting, or diarrhea. 11/20/2021 upon evaluation today patient appears to be doing decently well in regard to her wounds. This is good news. Fortunately the hand is almost completely healed and the foot is smaller although this week it is about the same compared to what we saw previous. I do not see any evidence of infection there is little bit of dry skin we will continue to remove this is still taking a little bit longer than what I would expect to heal this makes me concerned about the fact of possibly needing to look into a more formal arterial study with regard to her arterial flow to see what they stand. 11/27/2021 upon evaluation today patient appears to be doing well with regard to her wound although is not significantly smaller is also not any bigger which is good news. 12/05/2021 upon evaluation today patient's wound is actually showing signs of improvement and looks to be doing quite well at about half the size for at least 60% of the size that it was last week. Overall I am extremely pleased with that improvement. 12/19/2021 upon evaluation today patient actually appears to be healed based on what we are seeing I do not see any signs of active infection at this time. No fevers, chills, nausea, vomiting, or diarrhea. Readmission: 05-29-2022 upon evaluation today patient appears to be doing well with regard to her wound. This is actually a new injury that she sustained when she had her shin hit on a metal chair that was beside her bed. Subsequently the patient tells me that this happened around May 18. They have been putting Vaseline on this and using the bordered foam dressings which has done decently well for her up  to this point. With that being said she tells me she is having some discomfort but not nearly as bad as  what she previously noted with her hand. That is what I was seeing her previous. Patient's past medical history really has not changed everything remains essentially the same. She does have a history of congestive heart failure, COPD, and diabetes mellitus type 2. She also does have oxygen 24/7. Her most recent hemoglobin A1c was on March 22 and was 6.3. Patient History [name], [name] ([id number]) Information obtained from Patient. Allergies Mucinex D Social History Former smoker - 2014 quit, Alcohol Use - Never, Drug Use - No History, Caffeine Use - Daily - coffee. Medical History Hematologic/Lymphatic Patient has history of Anemia Respiratory Patient has history of Chronic Obstructive Pulmonary Disease (COPD) Cardiovascular Patient has history of Congestive Heart Failure, Hypertension Endocrine Patient has history of Type II Diabetes Denies history of Type I Diabetes Neurologic Denies history of Seizure Disorder Patient is treated with Controlled Diet. Blood sugar is not tested. Medical And Surgical History Notes Endocrine pt states never diagnosed states takes no medication Review of Systems (ROS) Constitutional Symptoms (General Health) Denies complaints or symptoms of Fatigue, Fever, Chills, Marked Weight Change. Eyes Denies complaints or symptoms of Dry Eyes, Vision Changes, Glasses / Contacts. Ear/Nose/Mouth/Throat Denies complaints or symptoms of Difficult clearing ears, Sinusitis. Respiratory Complains or has symptoms of Shortness of Breath. Cardiovascular Complains or has symptoms of LE edema. Gastrointestinal Denies complaints or symptoms of Frequent diarrhea, Nausea, Vomiting. Genitourinary acute kidney injury Immunological Denies complaints or symptoms of Hives, Itching. Integumentary (Skin) Complains or has symptoms of Wounds. Musculoskeletal Denies complaints or symptoms of Muscle Pain, Muscle Weakness. Neurologic Denies complaints or symptoms of  Numbness/parasthesias, Focal/Weakness. Psychiatric Denies complaints or symptoms of Anxiety, Claustrophobia. Objective Constitutional sitting or standing blood pressure is within target range for patient.. pulse regular and within target range for patient.Marland Kitchen respirations regular, non- labored and within target range for patient.Marland Kitchen temperature within target range for patient.. Well-nourished and well-hydrated in no acute distress. Vitals Time Taken: 10:10 AM, Height: 62 in, Source: Stated, Weight: 104 lbs, Source: Stated, BMI: 19, Temperature: 98.3 F, Pulse: 61 bpm, Respiratory Rate: 18 breaths/min, Blood Pressure: 107/79 mmHg. Eyes conjunctiva clear no eyelid edema noted. pupils equal round and reactive to light and accommodation. Ears, Nose, Mouth, and Throat no gross abnormality of ear auricles or external auditory canals. normal hearing noted during conversation. mucus membranes moist. [name], [name]. ([id number]) Respiratory normal breathing without difficulty. Cardiovascular 2+ dorsalis pedis/posterior tibialis pulses. no clubbing, cyanosis, significant edema, Musculoskeletal normal gait and posture. no significant deformity or arthritic changes, no loss or range of motion, no clubbing. Psychiatric this patient is able to make decisions and demonstrates good insight into disease process. Alert and Oriented x 3. pleasant and cooperative. General Notes: Upon inspection patient's wound bed actually did not require any sharp debridement and seems to be healing quite nicely. I do not see any evidence of active infection locally or systemically which is great news and overall I am extremely pleased with where we stand currently. Integumentary (Hair, Skin) Wound #3 status is Open. Original cause of wound was Trauma. The date acquired was: 05/17/2022. The wound is located on the Left,Proximal Lower Leg. The wound measures 3.8cm length x 1.7cm width x 0.1cm depth; 5.074cm^2 area and 0.507cm^3  volume. There is Fat Layer (Subcutaneous Tissue) exposed. There is no tunneling or undermining noted. There is a medium amount of serosanguineous drainage noted. There is  medium (34-66%) red granulation within the wound bed. There is a medium (34-66%) amount of necrotic tissue within the wound bed including Adherent Slough. Assessment Active Problems ICD-10 Type 2 diabetes mellitus with other skin ulcer Laceration without foreign body, left lower leg, initial encounter Non-pressure chronic ulcer of other part of left lower leg with fat layer exposed Chronic obstructive pulmonary disease, unspecified Chronic combined systolic (congestive) and diastolic (congestive) heart failure Dependence on supplemental oxygen Procedures Wound #3 Pre-procedure diagnosis of Wound #3 is a Trauma, Other located on the Left,Proximal Lower Leg . There was a Chemical/Enzymatic/Mechanical debridement performed by Tommie Sams., PA-C. With the following instrument(s): saline gauze after achieving pain control using Lidocaine 4% Topical Solution. Other agent used was Gauze and saline. A time out was conducted at 10:55, prior to the start of the procedure. There was no bleeding. The procedure was tolerated well. Post Debridement Measurements: 3.8cm length x 1.7cm width x 0.1cm depth; 0.507cm^3 volume. Character of Wound/Ulcer Post Debridement is stable. Post procedure Diagnosis Wound #3: Same as Pre-Procedure Plan Follow-up Appointments: Return Appointment in 2 weeks. Nurse Visit as needed Bathing/ Shower/ Hygiene: May shower; gently cleanse wound with antibacterial soap, rinse and pat dry prior to dressing wounds No tub bath. Edema Control - Lymphedema / Segmental Compressive Device / Other: Elevate, Exercise Daily and Avoid Standing for Long Periods of Time. Elevate leg(s) parallel to the floor when sitting. DO YOUR BEST to sleep in the bed at night. DO NOT sleep in your recliner. Long hours of sitting in a  recliner leads to swelling of the legs and/or potential wounds on your backside. WOUND #3: - Lower Leg Wound Laterality: Left, Proximal Cleanser: [employer] Ancillary Kit - 15 Day Supply (DME) (Generic) 3 x Per Week/30 Days Discharge Instructions: Use supplies as instructed; Kit contains: (15) Saline Bullets; (15) 3x3 Gauze; 15 pr Gloves Cleanser: Soap and Water 3 x Per Week/30 Days Discharge Instructions: Gently cleanse wound with antibacterial soap, rinse and pat dry prior to dressing wounds [name], [name] ([id number]) Primary Dressing: Xeroform 5x9-HBD (in/in) (DME) (Generic) 3 x Per Week/30 Days Discharge Instructions: Apply Xeroform 5x9-HBD (in/in) as directed Secondary Dressing: (SILCONE BORDER) Zetuvit Plus SILICONE BORDER Dressing 5x5 (in/in) (DME) (Generic) 3 x Per Week/30 Days Discharge Instructions: Please do not put silicone bordered dressings under wraps. Use non-bordered dressing only. 1. I am going to suggest that we going continue with the wound care measures as before and the patient is in agreement with plan. This includes the use of the Xeroform gauze dressing which I do believe is good to do a good job here. She has done well with this in the past. 2. I am also can recommend that we use a bordered foam dressing to cover which I think will provide protection as well as appropriate wound environment for this to heal. 3. We will plan to see her back for reevaluation in 2 weeks time and see where things stand at that point. We will see patient back for reevaluation in 2 weeks here in the clinic. If anything worsens or changes patient will contact our office for additional recommendations. Electronic Signature(s) Signed: 05/29/2022 11:13:11 AM By: Worthy Keeler PA-C Entered By: Worthy Keeler on 05/29/2022 11:13:11 [name] ([id number]) -------------------------------------------------------------------------------- ROS/PFSH Details Patient Name: [name]. Date of Service: 05/29/2022 10:00 AM Medical Record Number: [id number] Patient Account Number: [id number] Date of Birth/Sex: [birth year] (81 y.o. F) Treating RN: Levora Dredge Primary Care Provider: Frazier Richards Other  Clinician: Referring Provider: Frazier Richards Treating Provider/Extender: Skipper Cliche in Treatment: 0 Information Obtained From Patient Constitutional Symptoms (General Health) Complaints and Symptoms: Negative for: Fatigue; Fever; Chills; Marked Weight Change Eyes Complaints and Symptoms: Negative for: Dry Eyes; Vision Changes; Glasses / Contacts Ear/Nose/Mouth/Throat Complaints and Symptoms: Negative for: Difficult clearing ears; Sinusitis Respiratory Complaints and Symptoms: Positive for: Shortness of Breath Medical History: Positive for: Chronic Obstructive Pulmonary Disease (COPD) Cardiovascular Complaints and Symptoms: Positive for: LE edema Medical History: Positive for: Congestive Heart Failure; Hypertension Gastrointestinal Complaints and Symptoms: Negative for: Frequent diarrhea; Nausea; Vomiting Immunological Complaints and Symptoms: Negative for: Hives; Itching Integumentary (Skin) Complaints and Symptoms: Positive for: Wounds Musculoskeletal Complaints and Symptoms: Negative for: Muscle Pain; Muscle Weakness Neurologic Complaints and Symptoms: Negative for: Numbness/parasthesias; Focal/Weakness Medical History: [name], [name] ([id number]) Negative for: Seizure Disorder Psychiatric Complaints and Symptoms: Negative for: Anxiety; Claustrophobia Hematologic/Lymphatic Medical History: Positive for: Anemia Endocrine Medical History: Positive for: Type II Diabetes Negative for: Type I Diabetes Past Medical History Notes: pt states never diagnosed states takes no medication Treated with: Diet Blood sugar tested every day: No Genitourinary Complaints and Symptoms: Review of System Notes: acute kidney  injury Oncologic Immunizations Pneumococcal Vaccine: Received Pneumococcal Vaccination: Yes Received Pneumococcal Vaccination On or After 60th Birthday: Yes Implantable Devices None Family and Social History Former smoker - 2014 quit; Alcohol Use: Never; Drug Use: No History; Caffeine Use: Daily - coffee Electronic Signature(s) Signed: 05/29/2022 12:09:48 PM By: Worthy Keeler PA-C Signed: 05/29/2022 4:32:40 PM By: Levora Dredge Entered By: Levora Dredge on 05/29/2022 10:47:29 [name] ([id number]) -------------------------------------------------------------------------------- SuperBill Details Patient Name: [name]. Date of Service: 05/29/2022 Medical Record Number: [id number] Patient Account Number: [id number] Date of Birth/Sex: Jan 21, 1941 (81 y.o. F) Treating RN: Levora Dredge Primary Care Provider: Frazier Richards Other Clinician: Referring Provider: Frazier Richards Treating Provider/Extender: Skipper Cliche in Treatment: 0 Diagnosis Coding ICD-10 Codes Code Description E11.622 Type 2 diabetes mellitus with other skin ulcer S81.812A Laceration without foreign body, left lower leg, initial encounter L97.822 Non-pressure chronic ulcer of other part of left lower leg with fat layer exposed J44.9 Chronic obstructive pulmonary disease, unspecified I50.42 Chronic combined systolic (congestive) and diastolic (congestive) heart failure Z99.81 Dependence on supplemental oxygen Facility Procedures CPT4 Code: 38182993 Description: 99214 - WOUND CARE VISIT-LEV 4 EST PT Modifier: Quantity: 1 Physician Procedures CPT4 Code: 7169678 Description: 99214 - WC PHYS LEVEL 4 - EST PT Modifier: Quantity: 1 CPT4 Code: Description: ICD-10 Diagnosis Description E11.622 Type 2 diabetes mellitus with other skin ulcer S81.812A Laceration without foreign body, left lower leg, initial encounter L97.822 Non-pressure chronic ulcer of other part of left lower leg with  fat lay  J44.9 Chronic obstructive pulmonary disease, unspecified Modifier: er exposed Quantity: Electronic Signature(s) Signed: 05/29/2022 11:13:30 AM By: Worthy Keeler PA-C Entered By: Worthy Keeler on 05/29/2022 11:13:30

## 2022-05-31 DIAGNOSIS — E11622 Type 2 diabetes mellitus with other skin ulcer: Secondary | ICD-10-CM | POA: Diagnosis not present

## 2022-06-12 ENCOUNTER — Encounter: Payer: PPO | Attending: Physician Assistant | Admitting: Internal Medicine

## 2022-06-12 DIAGNOSIS — E11622 Type 2 diabetes mellitus with other skin ulcer: Secondary | ICD-10-CM | POA: Diagnosis not present

## 2022-06-12 DIAGNOSIS — W2203XA Walked into furniture, initial encounter: Secondary | ICD-10-CM | POA: Insufficient documentation

## 2022-06-12 DIAGNOSIS — S81812A Laceration without foreign body, left lower leg, initial encounter: Secondary | ICD-10-CM | POA: Insufficient documentation

## 2022-06-12 DIAGNOSIS — L97822 Non-pressure chronic ulcer of other part of left lower leg with fat layer exposed: Secondary | ICD-10-CM | POA: Diagnosis not present

## 2022-06-12 DIAGNOSIS — I5042 Chronic combined systolic (congestive) and diastolic (congestive) heart failure: Secondary | ICD-10-CM | POA: Diagnosis not present

## 2022-06-12 DIAGNOSIS — J449 Chronic obstructive pulmonary disease, unspecified: Secondary | ICD-10-CM | POA: Diagnosis not present

## 2022-06-12 DIAGNOSIS — Z9981 Dependence on supplemental oxygen: Secondary | ICD-10-CM | POA: Diagnosis not present

## 2022-06-13 NOTE — Progress Notes (Signed)
SEAIRRA, OTANI ([id number]) Visit Report for 06/12/2022 HPI Details Patient Name: [name], [name]. Date of Service: 06/12/2022 11:30 AM Medical Record Number: [id number] Patient Account Number: [id number] Date of Birth/Sex: [birth year] (81 y.o. F) Treating RN: Levora Dredge Primary Care Provider: Frazier Richards Other Clinician: Referring Provider: Frazier Richards Treating Provider/Extender: Tito Dine in Treatment: 2 History of Present Illness HPI Description: 09/21/2021 upon evaluation today patient appears to be doing somewhat poorly in regard to her wound on her right lateral ankle. She has been tolerating the dressing changes without complication. Fortunately there does not not appear to be any signs of active infection systemically at this time which is great news. Likely also do not see any major issues which is good news. Nonetheless the biggest problem is that even with attempting to treat this and try to get things under control on her own she is really not been able to get this healed. She does tend to sleep on her right side exclusively due to the fact that she is not able to lay any other way due to pain. She has issues with her right hip. She is a diabetic her last hemoglobin A1c was 5.6 however this should not be affecting her ability to heal she also had an MRI in July which was negative for osteomyelitis. Overall everything checks out okay but nonetheless she is still experiencing the open wound that she just cannot get closed. She does wear oxygen 24/7. She has a history of congestive heart failure, COPD, and again the diabetes mellitus type 2. 09/29/2021 upon evaluation today patient appears to be doing roughly the same medial movement better as far as some of the necrotic tissue on the surface of the wound is concerned it is kind of gently coming off little by little. With that being said I do not see any signs of active infection systemically at this  point which is good news. 10/06/2021 upon evaluation today patient appears to be doing excellent in regard to her wound from an infection standpoint I do not see any issues here currently. Unfortunately she continues develop slough over the surface of the wound where using collagen I am not certain that skin to be the best way to go going forward. I really think she may do well with Iodoflex. 10/13/2021 upon evaluation today patient appears to be doing well with regard to her wound. I actually feel like this is showing signs of improvement with regard to the overall appearance I do believe the Iodoflex has been helpful. 10/20/2021 upon evaluation today patient appears to be doing a little better in regard to her ankle ulcer. With that being said she has a new wound on her hand as well due to skin tear that occurred on the way home last week after I saw her. Fortunately there does not appear to be any evidence of active infection at this time. No fevers, chills, nausea, vomiting, or diarrhea. 10/27/2021 upon evaluation today 10/27/2021 upon evaluation today patient appears to be doing better in regard to the wound on the hand as well as the ankle both are showing signs of being smaller which is great news. I do not see any signs of infection which is also great news. In general I think that we are headed in the appropriate direction. 11/03/21 upon evaluation today patient appears to be doing well with regard to her wounds. She has been tolerating the dressing changes without complication. Fortunately there does not appear to be any  signs of active infection at this time which is great news. No fevers, chills, nausea, vomiting, or diarrhea. 11/20/2021 upon evaluation today patient appears to be doing decently well in regard to her wounds. This is good news. Fortunately the hand is almost completely healed and the foot is smaller although this week it is about the same compared to what we saw previous. I  do not see any evidence of infection there is little bit of dry skin we will continue to remove this is still taking a little bit longer than what I would expect to heal this makes me concerned about the fact of possibly needing to look into a more formal arterial study with regard to her arterial flow to see what they stand. 11/27/2021 upon evaluation today patient appears to be doing well with regard to her wound although is not significantly smaller is also not any bigger which is good news. 12/05/2021 upon evaluation today patient's wound is actually showing signs of improvement and looks to be doing quite well at about half the size for at least 60% of the size that it was last week. Overall I am extremely pleased with that improvement. 12/19/2021 upon evaluation today patient actually appears to be healed based on what we are seeing I do not see any signs of active infection at this time. No fevers, chills, nausea, vomiting, or diarrhea. Readmission: 05-29-2022 upon evaluation today patient appears to be doing well with regard to her wound. This is actually a new injury that she sustained when she had her shin hit on a metal chair that was beside her bed. Subsequently the patient tells me that this happened around May 18. They have been putting Vaseline on this and using the bordered foam dressings which has done decently well for her up to this point. With that being said she tells me she is having some discomfort but not nearly as bad as what she previously noted with her hand. That is what I was seeing her previous. Patient's past medical history really has not changed everything remains essentially the same. She does have a history of congestive heart failure, COPD, and diabetes mellitus type 2. She also does have oxygen 24/7. Her most recent hemoglobin A1c was on March 22 and was 6.3. 6/13; initially traumatic wound on her left upper lower leg. She has been using Xeroform covered with a  border foam dressing. This is nicely epithelializing REBIE, PEALE ([id number]) Electronic Signature(s) Signed: 06/13/2022 11:05:19 AM By: Linton Ham MD Entered By: Linton Ham on 06/12/2022 12:11:59 [name] ([id number]) -------------------------------------------------------------------------------- Physical Exam Details Patient Name: [name]. Date of Service: 06/12/2022 11:30 AM Medical Record Number: [id number] Patient Account Number: [id number] Date of Birth/Sex: 11-28-1941 (81 y.o. F) Treating RN: Levora Dredge Primary Care Provider: Frazier Richards Other Clinician: Referring Provider: Frazier Richards Treating Provider/Extender: Tito Dine in Treatment: 2 Constitutional Patient is hypotensive.However she appears well. Chronic oxygen O2 sat at 88%. Temperature is normal and within the target range for the patient.Marland Kitchen appears in no distress. Notes Wound exam; left lower leg. Nice clean granulated surface. Epithelializing nicely no debridement was necessary. There is no edema Electronic Signature(s) Signed: 06/13/2022 11:05:19 AM By: Linton Ham MD Entered By: Linton Ham on 06/12/2022 12:12:56 [name] ([id number]) -------------------------------------------------------------------------------- Physician Orders Details Patient Name: [name]. Date of Service: 06/12/2022 11:30 AM Medical Record Number: [id number] Patient Account Number: [id number] Date of Birth/Sex: [birth year] (81 y.o. F) Treating RN: Levora Dredge Primary  Care Provider: Frazier Richards Other Clinician: Referring Provider: Frazier Richards Treating Provider/Extender: Tito Dine in Treatment: 2 Verbal / Phone Orders: No Diagnosis Coding Follow-up Appointments o Return Appointment in 2 weeks. o Nurse Visit as needed Bathing/ Shower/ Hygiene o May shower; gently cleanse wound with antibacterial soap, rinse and  pat dry prior to dressing wounds o No tub bath. Edema Control - Lymphedema / Segmental Compressive Device / Other o Elevate, Exercise Daily and Avoid Standing for Long Periods of Time. o Elevate leg(s) parallel to the floor when sitting. o DO YOUR BEST to sleep in the bed at night. DO NOT sleep in your recliner. Long hours of sitting in a recliner leads to swelling of the legs and/or potential wounds on your backside. Wound Treatment Wound #3 - Lower Leg Wound Laterality: Left, Proximal Cleanser: Byram Ancillary Kit - 15 Day Supply (Generic) 3 x Per Week/30 Days Discharge Instructions: Use supplies as instructed; Kit contains: (15) Saline Bullets; (15) 3x3 Gauze; 15 pr Gloves Cleanser: Soap and Water 3 x Per Week/30 Days Discharge Instructions: Gently cleanse wound with antibacterial soap, rinse and pat dry prior to dressing wounds Primary Dressing: Xeroform 5x9-HBD (in/in) (Generic) 3 x Per Week/30 Days Discharge Instructions: Apply Xeroform 5x9-HBD (in/in) as directed Secondary Dressing: (SILCONE BORDER) Zetuvit Plus SILICONE BORDER Dressing 5x5 (in/in) (Generic) 3 x Per Week/30 Days Discharge Instructions: Please do not put silicone bordered dressings under wraps. Use non-bordered dressing only. Electronic Signature(s) Signed: 06/12/2022 4:10:46 PM By: Levora Dredge Signed: 06/13/2022 11:05:19 AM By: Linton Ham MD Entered By: Levora Dredge on 06/12/2022 12:11:34 [name] ([id number]) -------------------------------------------------------------------------------- Problem List Details Patient Name: MIRIELLE, BYRUM. Date of Service: 06/12/2022 11:30 AM Medical Record Number: [id number] Patient Account Number: [id number] Date of Birth/Sex: Jun 20, 1941 (81 y.o. F) Treating RN: Levora Dredge Primary Care Provider: Frazier Richards Other Clinician: Referring Provider: Frazier Richards Treating Provider/Extender: Tito Dine in Treatment: 2 Active  Problems ICD-10 Encounter Code Description Active Date MDM Diagnosis E11.622 Type 2 diabetes mellitus with other skin ulcer 05/29/2022 No Yes S81.812A Laceration without foreign body, left lower leg, initial encounter 05/29/2022 No Yes L97.822 Non-pressure chronic ulcer of other part of left lower leg with fat layer 05/29/2022 No Yes exposed J44.9 Chronic obstructive pulmonary disease, unspecified 05/29/2022 No Yes 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 Date of Service: 06/12/2022 11:30 AM Medical Record Number: [id number] Patient Account Number: [id number] Date of Birth/Sex: 1941/05/27 (81 y.o. F) Treating RN: Levora Dredge Primary Care Provider: Frazier Richards Other Clinician: Referring Provider: Frazier Richards Treating Provider/Extender: Tito Dine in Treatment: 2 Subjective History of Present Illness (HPI) 09/21/2021 upon evaluation today patient appears to be doing somewhat poorly in regard to her wound on her right lateral ankle. She has been tolerating the dressing changes without complication. Fortunately there does not not appear to be any signs of active infection systemically at this time which is great news. Likely also do not see any major issues which is good news. Nonetheless the biggest problem is that even with attempting to treat this and try to get things under control on her own she is really not been able to get this healed. She does tend to sleep  on her right side exclusively due to the fact that  she is not able to lay any other way due to pain. She has issues with her right hip. She is a diabetic her last hemoglobin A1c was 5.6 however this should not be affecting her ability to heal she also had an MRI in July which was negative for osteomyelitis. Overall everything checks out okay but nonetheless she is still experiencing the open wound that she just cannot get closed. She does wear oxygen 24/7. She has a history of congestive heart failure, COPD, and again the diabetes mellitus type 2. 09/29/2021 upon evaluation today patient appears to be doing roughly the same medial movement better as far as some of the necrotic tissue on the surface of the wound is concerned it is kind of gently coming off little by little. With that being said I do not see any signs of active infection systemically at this point which is good news. 10/06/2021 upon evaluation today patient appears to be doing excellent in regard to her wound from an infection standpoint I do not see any issues here currently. Unfortunately she continues develop slough over the surface of the wound where using collagen I am not certain that skin to be the best way to go going forward. I really think she may do well with Iodoflex. 10/13/2021 upon evaluation today patient appears to be doing well with regard to her wound. I actually feel like this is showing signs of improvement with regard to the overall appearance I do believe the Iodoflex has been helpful. 10/20/2021 upon evaluation today patient appears to be doing a little better in regard to her ankle ulcer. With that being said she has a new wound on her hand as well due to skin tear that occurred on the way home last week after I saw her. Fortunately there does not appear to be any evidence of active infection at this time. No fevers, chills, nausea, vomiting, or diarrhea. 10/27/2021 upon evaluation today 10/27/2021 upon evaluation today patient appears to be doing better in regard  to the wound on the hand as well as the ankle both are showing signs of being smaller which is great news. I do not see any signs of infection which is also great news. In general I think that we are headed in the appropriate direction. 11/03/21 upon evaluation today patient appears to be doing well with regard to her wounds. She has been tolerating the dressing changes without complication. Fortunately there does not appear to be any signs of active infection at this time which is great news. No fevers, chills, nausea, vomiting, or diarrhea. 11/20/2021 upon evaluation today patient appears to be doing decently well in regard to her wounds. This is good news. Fortunately the hand is almost completely healed and the foot is smaller although this week it is about the same compared to what we saw previous. I do not see any evidence of infection there is little bit of dry skin we will continue to remove this is still taking a little bit longer than what I would expect to heal this makes me concerned about the fact of possibly needing to look into a more formal arterial study with regard to her arterial flow to see what they stand. 11/27/2021 upon evaluation today patient appears to be doing well with regard to her wound although is not significantly smaller is also not any bigger which is good news. 12/05/2021 upon evaluation today patient's wound is actually  showing signs of improvement and looks to be doing quite well at about half the size for at least 60% of the size that it was last week. Overall I am extremely pleased with that improvement. 12/19/2021 upon evaluation today patient actually appears to be healed based on what we are seeing I do not see any signs of active infection at this time. No fevers, chills, nausea, vomiting, or diarrhea. Readmission: 05-29-2022 upon evaluation today patient appears to be doing well with regard to her wound. This is actually a new injury that she sustained  when she had her shin hit on a metal chair that was beside her bed. Subsequently the patient tells me that this happened around May 18. They have been putting Vaseline on this and using the bordered foam dressings which has done decently well for her up to this point. With that being said she tells me she is having some discomfort but not nearly as bad as what she previously noted with her hand. That is what I was seeing her previous. Patient's past medical history really has not changed everything remains essentially the same. She does have a history of congestive heart failure, COPD, and diabetes mellitus type 2. She also does have oxygen 24/7. Her most recent hemoglobin A1c was on March 22 and was 6.3. 6/13; initially traumatic wound on her left upper lower leg. She has been using Xeroform covered with a border foam dressing. This is nicely epithelializing SHATIMA, ZALAR ([id number]) Objective Constitutional Patient is hypotensive.However she appears well. Chronic oxygen O2 sat at 88%. Temperature is normal and within the target range for the patient.Marland Kitchen appears in no distress. Vitals Time Taken: 11:51 AM, Height: 62 in, Weight: 104 lbs, BMI: 19, Temperature: 97.8 F, Pulse: 63 bpm, Respiratory Rate: 20 breaths/min, Blood Pressure: 97/48 mmHg, Pulse Oximetry: 88 %. General Notes: Wound exam; left lower leg. Nice clean granulated surface. Epithelializing nicely no debridement was necessary. There is no edema Integumentary (Hair, Skin) Wound #3 status is Open. Original cause of wound was Trauma. The date acquired was: 05/17/2022. The wound has been in treatment 2 weeks. The wound is located on the Left,Proximal Lower Leg. The wound measures 1.3cm length x 0.3cm width x 0.1cm depth; 0.306cm^2 area and 0.031cm^3 volume. There is Fat Layer (Subcutaneous Tissue) exposed. There is no tunneling or undermining noted. There is a medium amount of serosanguineous drainage noted. There is large  (67-100%) red granulation within the wound bed. There is a small (1-33%) amount of necrotic tissue within the wound bed including Adherent Slough. Assessment Active Problems ICD-10 Type 2 diabetes mellitus with other skin ulcer Laceration without foreign body, left lower leg, initial encounter Non-pressure chronic ulcer of other part of left lower leg with fat layer exposed Chronic obstructive pulmonary disease, unspecified Chronic combined systolic (congestive) and diastolic (congestive) heart failure Dependence on supplemental oxygen Plan Follow-up Appointments: Return Appointment in 2 weeks. Nurse Visit as needed Bathing/ Shower/ Hygiene: May shower; gently cleanse wound with antibacterial soap, rinse and pat dry prior to dressing wounds No tub bath. Edema Control - Lymphedema / Segmental Compressive Device / Other: Elevate, Exercise Daily and Avoid Standing for Long Periods of Time. Elevate leg(s) parallel to the floor when sitting. DO YOUR BEST to sleep in the bed at night. DO NOT sleep in your recliner. Long hours of sitting in a recliner leads to swelling of the legs and/or potential wounds on your backside. WOUND #3: - Lower Leg Wound Laterality: Left, Proximal Cleanser: Byram  Ancillary Kit - 15 Day Supply (Generic) 3 x Per Week/30 Days Discharge Instructions: Use supplies as instructed; Kit contains: (15) Saline Bullets; (15) 3x3 Gauze; 15 pr Gloves Cleanser: Soap and Water 3 x Per Week/30 Days Discharge Instructions: Gently cleanse wound with antibacterial soap, rinse and pat dry prior to dressing wounds Primary Dressing: Xeroform 5x9-HBD (in/in) (Generic) 3 x Per Week/30 Days Discharge Instructions: Apply Xeroform 5x9-HBD (in/in) as directed Secondary Dressing: (SILCONE BORDER) Zetuvit Plus SILICONE BORDER Dressing 5x5 (in/in) (Generic) 3 x Per Week/30 Days Discharge Instructions: Please do not put silicone bordered dressings under wraps. Use non-bordered dressing only. 1.  At this point I think everything is clean here. No need is a reason to change the dressing healthy wound surface with epithelialization. Could be closed in the next time she is here in 2 weeks KORENA, NASS ([id number]) Electronic Signature(s) Signed: 06/13/2022 11:05:19 AM By: Linton Ham MD Entered By: Linton Ham on 06/12/2022 12:13:22 [name] ([id number]) -------------------------------------------------------------------------------- SuperBill Details Patient Name: [name]. Date of Service: 06/12/2022 Medical Record Number: [id number] Patient Account Number: [id number] Date of Birth/Sex: 1941-05-23 (81 y.o. F) Treating RN: Levora Dredge Primary Care Provider: Frazier Richards Other Clinician: Referring Provider: Frazier Richards Treating Provider/Extender: Tito Dine in Treatment: 2 Diagnosis Coding ICD-10 Codes Code Description E11.622 Type 2 diabetes mellitus with other skin ulcer S81.812A Laceration without foreign body, left lower leg, initial encounter L97.822 Non-pressure chronic ulcer of other part of left lower leg with fat layer exposed J44.9 Chronic obstructive pulmonary disease, unspecified I50.42 Chronic combined systolic (congestive) and diastolic (congestive) heart failure Z99.81 Dependence on supplemental oxygen Facility Procedures CPT4 Code: 44619012 Description: [place] VISIT-LEV 3 EST PT Modifier: Quantity: 1 Physician Procedures CPT4 Code: 2241146 Description: 99213 - WC PHYS LEVEL 3 - EST PT Modifier: Quantity: 1 CPT4 Code: Description: ICD-10 Diagnosis Description S81.812A Laceration without foreign body, left lower leg, initial encounter L97.822 Non-pressure chronic ulcer of other part of left lower leg with fat lay Modifier: er exposed Quantity: Electronic Signature(s) Signed: 06/12/2022 3:46:13 PM By: Levora Dredge Signed: 06/13/2022 11:05:19 AM By: Linton Ham MD Entered By: Levora Dredge on 06/12/2022 15:46:13

## 2022-06-13 NOTE — Progress Notes (Signed)
DEVOIRY, CORRIHER ([id number]) Visit Report for 06/12/2022 Arrival Information Details Patient Name: [name], [name]. Date of Service: 06/12/2022 11:30 AM Medical Record Number: [id number] Patient Account Number: [id number] Date of Birth/Sex: 02/15/1941 (81 y.o. F) Treating RN: Levora Dredge Primary Care Gwendy Boeder: Frazier Richards Other Clinician: Referring Jahmari Esbenshade: Frazier Richards Treating Katelinn Justice/Extender: Tito Dine in Treatment: 2 Visit Information History Since Last Visit Added or deleted any medications: No Patient Arrived: Wheel Chair Any new allergies or adverse reactions: No Arrival Time: 11:51 Had a fall or experienced change in No Accompanied By: family activities of daily living that may affect Transfer Assistance: EasyPivot Patient Lift risk of falls: Patient Identification Verified: Yes Hospitalized since last visit: No Secondary Verification Process Completed: Yes Has Dressing in Place as Prescribed: Yes Patient Has Alerts: Yes Pain Present Now: No Patient Alerts: ABI 11/30/21 R 1.13 L 1.38 Electronic Signature(s) Signed: 06/12/2022 4:10:46 PM By: Levora Dredge Entered By: Levora Dredge on 06/12/2022 11:51:25 [name] ([id number]) -------------------------------------------------------------------------------- Clinic Level of Care Assessment Details Patient Name: [name]. Date of Service: 06/12/2022 11:30 AM Medical Record Number: [id number] Patient Account Number: [id number] Date of Birth/Sex: 11/21/1941 (81 y.o. F) Treating RN: Levora Dredge Primary Care Chonda Baney: Frazier Richards Other Clinician: Referring Jalayah Gutridge: Frazier Richards Treating Dalen Hennessee/Extender: Tito Dine in Treatment: 2 Clinic Level of Care Assessment Items TOOL 4 Quantity Score [] - Use when only an EandM is performed on FOLLOW-UP visit 0 ASSESSMENTS - Nursing Assessment / Reassessment X - Reassessment of Co-morbidities  (includes updates in patient status) 1 10 X- 1 5 Reassessment of Adherence to Treatment Plan ASSESSMENTS - Wound and Skin Assessment / Reassessment X - Simple Wound Assessment / Reassessment - one wound 1 5 [] - 0 Complex Wound Assessment / Reassessment - multiple wounds [] - 0 Dermatologic / Skin Assessment (not related to wound area) ASSESSMENTS - Focused Assessment X - Circumferential Edema Measurements - multi extremities 1 5 [] - 0 Nutritional Assessment / Counseling / Intervention [] - 0 Lower Extremity Assessment (monofilament, tuning fork, pulses) [] - 0 Peripheral Arterial Disease Assessment (using hand held doppler) ASSESSMENTS - Ostomy and/or Continence Assessment and Care [] - Incontinence Assessment and Management 0 [] - 0 Ostomy Care Assessment and Management (repouching, etc.) PROCESS - Coordination of Care X - Simple Patient / Family Education for ongoing care 1 15 [] - 0 Complex (extensive) Patient / Family Education for ongoing care [] - 0 Staff obtains Programmer, systems, Records, Test Results / Process Orders [] - 0 Staff telephones HHA, Nursing Homes / Clarify orders / etc [] - 0 Routine Transfer to another Facility (non-emergent condition) [] - 0 Routine Hospital Admission (non-emergent condition) [] - 0 New Admissions / Biomedical engineer / Ordering NPWT, Apligraf, etc. [] - 0 Emergency Hospital Admission (emergent condition) X- 1 10 Simple Discharge Coordination [] - 0 Complex (extensive) Discharge Coordination PROCESS - Special Needs [] - Pediatric / Minor Patient Management 0 [] - 0 Isolation Patient Management [] - 0 Hearing / Language / Visual special needs [] - 0 Assessment of Community assistance (transportation, D/C planning, etc.) [] - 0 Additional assistance / Altered mentation [] - 0 Support Surface(s) Assessment (bed, cushion, seat, etc.) INTERVENTIONS - Wound Cleansing / Measurement TACHINA, SPOONEMORE. ([id number]) X- 1 5 Simple  Wound Cleansing - one wound [] - 0 Complex Wound Cleansing - multiple wounds X- 1 5 Wound Imaging (photographs - any number of wounds) [] - 0 Wound Tracing (instead of  photographs) X- 1 5 Simple Wound Measurement - one wound [] - 0 Complex Wound Measurement - multiple wounds INTERVENTIONS - Wound Dressings X - Small Wound Dressing one or multiple wounds 1 10 [] - 0 Medium Wound Dressing one or multiple wounds [] - 0 Large Wound Dressing one or multiple wounds X- 1 5 Application of Medications - topical [] - 0 Application of Medications - injection INTERVENTIONS - Miscellaneous [] - External ear exam 0 [] - 0 Specimen Collection (cultures, biopsies, blood, body fluids, etc.) [] - 0 Specimen(s) / Culture(s) sent or taken to Lab for analysis [] - 0 Patient Transfer (multiple staff / Civil Service fast streamer / Similar devices) [] - 0 Simple Staple / Suture removal (25 or less) [] - 0 Complex Staple / Suture removal (26 or more) [] - 0 Hypo / Hyperglycemic Management (close monitor of Blood Glucose) [] - 0 Ankle / Brachial Index (ABI) - do not check if billed separately X- 1 5 Vital Signs Has the patient been seen at the hospital within the last three years: Yes Total Score: 85 Level Of Care: New/Established - Level 3 Electronic Signature(s) Signed: 06/12/2022 4:10:46 PM By: Levora Dredge Entered By: Levora Dredge on 06/12/2022 15:46:07 [name] ([id number]) -------------------------------------------------------------------------------- Encounter Discharge Information Details Patient Name: [name]. Date of Service: 06/12/2022 11:30 AM Medical Record Number: [id number] Patient Account Number: [id number] Date of Birth/Sex: August 29, 1941 (81 y.o. F) Treating RN: Levora Dredge Primary Care Provider: Frazier Richards Other Clinician: Referring Provider: Frazier Richards Treating Provider/Extender: Tito Dine in Treatment: 2 Encounter Discharge  Information Items Discharge Condition: Stable Ambulatory Status: Wheelchair Discharge Destination: Home Transportation: Private Auto Accompanied By: family Schedule Follow-up Appointment: Yes Clinical Summary of Care: Notes MD Dellia Nims aware of 88% saturations on 2L [place] Electronic Signature(s) Signed: 06/12/2022 3:47:13 PM By: Levora Dredge Entered By: Levora Dredge on 06/12/2022 15:47:13 [name] ([id number]) -------------------------------------------------------------------------------- Lower Extremity Assessment Details Patient Name: [name]. Date of Service: 06/12/2022 11:30 AM Medical Record Number: [id number] Patient Account Number: [id number] Date of Birth/Sex: 1941-09-22 (81 y.o. F) Treating RN: Levora Dredge Primary Care Provider: Frazier Richards Other Clinician: Referring Provider: Frazier Richards Treating Provider/Extender: Tito Dine in Treatment: 2 Edema Assessment Assessed: [Left: No] [Right: No] Edema: [Left: N] [Right: o] Calf Left: Right: Point of Measurement: 31 cm From Medial Instep 30 cm Ankle Left: Right: Point of Measurement: 9 cm From Medial Instep 20 cm Vascular Assessment Pulses: Dorsalis Pedis Palpable: [Left:Yes] Notes pedal pulse is slight Electronic Signature(s) Signed: 06/12/2022 4:10:46 PM By: Levora Dredge Entered By: Levora Dredge on 06/12/2022 12:02:21 [name] ([id number]) -------------------------------------------------------------------------------- Multi Wound Chart Details Patient Name: [name]. Date of Service: 06/12/2022 11:30 AM Medical Record Number: [id number] Patient Account Number: [id number] Date of Birth/Sex: 07-Sep-1941 (81 y.o. F) Treating RN: Levora Dredge Primary Care Provider: Frazier Richards Other Clinician: Referring Provider: Frazier Richards Treating Provider/Extender: Tito Dine in Treatment: 2 Vital Signs Height(in):  64 Pulse(bpm): 17 Weight(lbs): 104 Blood Pressure(mmHg): 15/48 Body Mass Index(BMI): 19 Temperature(F): 97.8 Respiratory Rate(breaths/min): 20 Photos: [N/A:N/A] Wound Location: Left, Proximal Lower Leg N/A N/A Wounding Event: Trauma N/A N/A Primary Etiology: Trauma, Other N/A N/A Comorbid History: Anemia, Chronic Obstructive N/A N/A Pulmonary Disease (COPD), Congestive Heart Failure, Hypertension, Type II Diabetes Date Acquired: 05/17/2022 N/A N/A Weeks of Treatment: 2 N/A N/A Wound Status: Open N/A N/A Wound Recurrence: No N/A N/A Measurements L x W x D (cm) 1.3x0.3x0.1 N/A N/A Area (cm) :  0.306 N/A N/A Volume (cm) : 0.031 N/A N/A % Reduction in Area: 94.00% N/A N/A % Reduction in Volume: 93.90% N/A N/A Classification: Full Thickness Without Exposed N/A N/A Support Structures Exudate Amount: Medium N/A N/A Exudate Type: Serosanguineous N/A N/A Exudate Color: red, brown N/A N/A Granulation Amount: Large (67-100%) N/A N/A Granulation Quality: Red N/A N/A Necrotic Amount: Small (1-33%) N/A N/A Exposed Structures: Fat Layer (Subcutaneous Tissue): N/A N/A Yes Epithelialization: Medium (34-66%) N/A N/A Treatment Notes Electronic Signature(s) Signed: 06/12/2022 4:10:46 PM By: Levora Dredge Entered By: Levora Dredge on 06/12/2022 12:06:08 [name] ([id number]) -------------------------------------------------------------------------------- [place] Details Patient Name: [name]. Date of Service: 06/12/2022 11:30 AM Medical Record Number: [id number] Patient Account Number: [id number] Date of Birth/Sex: 1941/03/01 (81 y.o. F) Treating RN: Levora Dredge Primary Care Elzia Hott: Frazier Richards Other Clinician: Referring Ramina Hulet: Frazier Richards Treating [place] Folden/Extender: Tito Dine in Treatment: 2 Active Inactive Wound/Skin Impairment Nursing Diagnoses: Impaired tissue integrity Knowledge deficit related to  ulceration/compromised skin integrity Goals: Ulcer/skin breakdown will have a volume reduction of 30% by week 4 Date Initiated: 05/29/2022 Target Resolution Date: 06/26/2022 Goal Status: Active Ulcer/skin breakdown will have a volume reduction of 50% by week 8 Date Initiated: 05/29/2022 Target Resolution Date: 07/24/2022 Goal Status: Active Ulcer/skin breakdown will have a volume reduction of 80% by week 12 Date Initiated: 05/29/2022 Target Resolution Date: 08/21/2022 Goal Status: Active Ulcer/skin breakdown will heal within 14 weeks Date Initiated: 05/29/2022 Target Resolution Date: 09/04/2022 Goal Status: Active Interventions: Assess patient/caregiver ability to obtain necessary supplies Assess patient/caregiver ability to perform ulcer/skin care regimen upon admission and as needed Assess ulceration(s) every visit Provide education on ulcer and skin care Notes: Electronic Signature(s) Signed: 06/12/2022 4:10:46 PM By: Levora Dredge Entered By: Levora Dredge on 06/12/2022 12:05:55 [name] ([id number]) -------------------------------------------------------------------------------- Pain Assessment Details Patient Name: [name]. Date of Service: 06/12/2022 11:30 AM Medical Record Number: [id number] Patient Account Number: [id number] Date of Birth/Sex: 19-May-1941 (81 y.o. F) Treating RN: Levora Dredge Primary Care Joenathan Sakuma: Frazier Richards Other Clinician: Referring Statia Burdick: Frazier Richards Treating Hodges Treiber/Extender: Tito Dine in Treatment: 2 Active Problems Location of Pain Severity and Description of Pain Patient Has Paino No Site Locations Rate the pain. Current Pain Level: 0 Pain Management and Medication Current Pain Management: Electronic Signature(s) Signed: 06/12/2022 4:10:46 PM By: Levora Dredge Entered By: Levora Dredge on 06/12/2022 11:55:40 [name]  ([id number]) -------------------------------------------------------------------------------- Patient/Caregiver Education Details Patient Name: [name]. Date of Service: 06/12/2022 11:30 AM Medical Record Number: [id number] Patient Account Number: [id number] Date of Birth/Gender: 04/30/1941 (81 y.o. F) Treating RN: Levora Dredge Primary Care Physician: Frazier Richards Other Clinician: Referring Physician: Frazier Richards Treating Physician/Extender: Tito Dine in Treatment: 2 Education Assessment Education Provided To: Patient Education Topics Provided Wound/Skin Impairment: Handouts: Caring for Your Ulcer Methods: Explain/Verbal Responses: State content correctly Electronic Signature(s) Signed: 06/12/2022 4:10:46 PM By: Levora Dredge Entered By: Levora Dredge on 06/12/2022 15:46:22 [name] ([id number]) -------------------------------------------------------------------------------- Wound Assessment Details Patient Name: [name]. Date of Service: 06/12/2022 11:30 AM Medical Record Number: [id number] Patient Account Number: [id number] Date of Birth/Sex: 12-04-1941 (81 y.o. F) Treating RN: Levora Dredge Primary Care Lavonne Cass: Frazier Richards Other Clinician: Referring Manuelito Poage: Frazier Richards Treating Haylee Mcanany/Extender: Tito Dine in Treatment: 2 Wound Status Wound Number: 3 Primary Trauma, Other Etiology: Wound Location: Left, Proximal Lower Leg Wound Open Wounding Event: Trauma Status: Date Acquired: 05/17/2022 Comorbid Anemia, Chronic Obstructive Pulmonary Disease (COPD), Weeks Of Treatment: 2  History: Congestive Heart Failure, Hypertension, Type II Diabetes Clustered Wound: No Photos Wound Measurements Length: (cm) 1.3 Width: (cm) 0.3 Depth: (cm) 0.1 Area: (cm) 0.306 Volume: (cm) 0.031 % Reduction in Area: 94% % Reduction in Volume: 93.9% Epithelialization: Medium (34-66%) Tunneling:  No Undermining: No Wound Description Classification: Full Thickness Without Exposed Support Structures Exudate Amount: Medium Exudate Type: Serosanguineous Exudate Color: red, brown Foul Odor After Cleansing: No Slough/Fibrino Yes Wound Bed Granulation Amount: Large (67-100%) Exposed Structure Granulation Quality: Red Fat Layer (Subcutaneous Tissue) Exposed: Yes Necrotic Amount: Small (1-33%) Necrotic Quality: Adherent Slough Treatment Notes Wound #3 (Lower Leg) Wound Laterality: Left, Proximal Cleanser [place] Ancillary Kit - 15 Day Supply Discharge Instruction: Use supplies as instructed; Kit contains: (15) Saline Bullets; (15) 3x3 Gauze; 15 pr Gloves Soap and Water Discharge Instruction: Gently cleanse wound with antibacterial soap, rinse and pat dry prior to dressing wounds Peri-Wound Care Topical TECIA, CINNAMON ([id number]) Primary Dressing Xeroform 5x9-HBD (in/in) Discharge Instruction: Apply Xeroform 5x9-HBD (in/in) as directed Secondary Dressing (SILCONE BORDER) Zetuvit Plus SILICONE BORDER Dressing 5x5 (in/in) Discharge Instruction: Please do not put silicone bordered dressings under wraps. Use non-bordered dressing only. Secured With Compression Wrap Compression Stockings Add-Ons Electronic Signature(s) Signed: 06/12/2022 4:10:46 PM By: Levora Dredge Entered By: Levora Dredge on 06/12/2022 12:01:48 [name] ([id number]) -------------------------------------------------------------------------------- Vitals Details Patient Name: [name]. Date of Service: 06/12/2022 11:30 AM Medical Record Number: [id number] Patient Account Number: [id number] Date of Birth/Sex: December 20, 1941 (81 y.o. F) Treating RN: Levora Dredge Primary Care Ryleah Miramontes: Frazier Richards Other Clinician: Referring Kenniel Bergsma: Frazier Richards Treating Rylyn Ranganathan/Extender: Tito Dine in Treatment: 2 Vital Signs Time Taken: 11:51 Temperature (F): 97.8 Height  (in): 62 Pulse (bpm): 63 Weight (lbs): 104 Respiratory Rate (breaths/min): 20 Body Mass Index (BMI): 19 Blood Pressure (mmHg): 97/48 Reference Range: 80 - 120 mg / dl Airway Pulse Oximetry (%): 88 Inhaled Oxygen Concentration (%): 2 Electronic Signature(s) Signed: 06/12/2022 4:10:46 PM By: Levora Dredge Entered By: Levora Dredge on 06/12/2022 11:55:26

## 2022-06-20 DIAGNOSIS — E038 Other specified hypothyroidism: Secondary | ICD-10-CM | POA: Diagnosis not present

## 2022-06-20 DIAGNOSIS — E1122 Type 2 diabetes mellitus with diabetic chronic kidney disease: Secondary | ICD-10-CM | POA: Diagnosis not present

## 2022-06-20 DIAGNOSIS — N1831 Chronic kidney disease, stage 3a: Secondary | ICD-10-CM | POA: Diagnosis not present

## 2022-06-20 DIAGNOSIS — I10 Essential (primary) hypertension: Secondary | ICD-10-CM | POA: Diagnosis not present

## 2022-06-21 DIAGNOSIS — N1831 Chronic kidney disease, stage 3a: Secondary | ICD-10-CM | POA: Diagnosis not present

## 2022-06-21 DIAGNOSIS — E1122 Type 2 diabetes mellitus with diabetic chronic kidney disease: Secondary | ICD-10-CM | POA: Diagnosis not present

## 2022-06-21 DIAGNOSIS — I10 Essential (primary) hypertension: Secondary | ICD-10-CM | POA: Diagnosis not present

## 2022-06-21 DIAGNOSIS — E038 Other specified hypothyroidism: Secondary | ICD-10-CM | POA: Diagnosis not present

## 2022-06-26 ENCOUNTER — Encounter: Payer: PPO | Admitting: Physician Assistant

## 2022-06-26 DIAGNOSIS — F325 Major depressive disorder, single episode, in full remission: Secondary | ICD-10-CM | POA: Diagnosis not present

## 2022-06-26 DIAGNOSIS — J42 Unspecified chronic bronchitis: Secondary | ICD-10-CM | POA: Diagnosis not present

## 2022-06-26 DIAGNOSIS — E11622 Type 2 diabetes mellitus with other skin ulcer: Secondary | ICD-10-CM | POA: Diagnosis not present

## 2022-06-26 DIAGNOSIS — N1831 Chronic kidney disease, stage 3a: Secondary | ICD-10-CM | POA: Diagnosis not present

## 2022-06-26 DIAGNOSIS — E038 Other specified hypothyroidism: Secondary | ICD-10-CM | POA: Diagnosis not present

## 2022-06-26 DIAGNOSIS — E785 Hyperlipidemia, unspecified: Secondary | ICD-10-CM | POA: Diagnosis not present

## 2022-06-26 DIAGNOSIS — I5030 Unspecified diastolic (congestive) heart failure: Secondary | ICD-10-CM | POA: Diagnosis not present

## 2022-06-26 DIAGNOSIS — E43 Unspecified severe protein-calorie malnutrition: Secondary | ICD-10-CM | POA: Diagnosis not present

## 2022-06-26 DIAGNOSIS — E1122 Type 2 diabetes mellitus with diabetic chronic kidney disease: Secondary | ICD-10-CM | POA: Diagnosis not present

## 2022-06-26 DIAGNOSIS — Z0000 Encounter for general adult medical examination without abnormal findings: Secondary | ICD-10-CM | POA: Diagnosis not present

## 2022-06-26 DIAGNOSIS — L97822 Non-pressure chronic ulcer of other part of left lower leg with fat layer exposed: Secondary | ICD-10-CM | POA: Diagnosis not present

## 2022-06-26 DIAGNOSIS — I10 Essential (primary) hypertension: Secondary | ICD-10-CM | POA: Diagnosis not present

## 2022-06-26 DIAGNOSIS — J9611 Chronic respiratory failure with hypoxia: Secondary | ICD-10-CM | POA: Diagnosis not present

## 2022-06-26 DIAGNOSIS — E1169 Type 2 diabetes mellitus with other specified complication: Secondary | ICD-10-CM | POA: Diagnosis not present

## 2022-06-26 NOTE — Progress Notes (Addendum)
Jasmine, Montoya ([id number]) Visit Report for 06/26/2022 Chief Complaint Document Details Patient Name: [name], LOUKS. Date of Service: 06/26/2022 1:00 PM Medical Record Number: [id number] Patient Account Number: [id number] Date of Birth/Sex: 1941-11-08 (81 y.o. F) Treating RN: Levora Dredge Primary Care Provider: Frazier Richards Other Clinician: Referring Provider: Frazier Richards Treating Provider/Extender: Skipper Cliche in Treatment: 4 Information Obtained from: Patient Chief Complaint Left LE Ulcer Electronic Signature(s) Signed: 06/26/2022 1:14:03 PM By: Worthy Keeler PA-C Entered By: Worthy Keeler on 06/26/2022 13:14:03 [name] ([id number]) -------------------------------------------------------------------------------- HPI Details Patient Name: [name]. Date of Service: 06/26/2022 1:00 PM Medical Record Number: [id number] Patient Account Number: [id number] Date of Birth/Sex: Apr 02, 1941 (81 y.o. F) Treating RN: Levora Dredge Primary Care Provider: Frazier Richards Other Clinician: Referring Provider: Frazier Richards Treating Provider/Extender: Skipper Cliche in Treatment: 4 History of Present Illness HPI Description: 09/21/2021 upon evaluation today patient appears to be doing somewhat poorly in regard to her wound on her right lateral ankle. She has been tolerating the dressing changes without complication. Fortunately there does not not appear to be any signs of active infection systemically at this time which is great news. Likely also do not see any major issues which is good news. Nonetheless the biggest problem is that even with attempting to treat this and try to get things under control on her own she is really not been able to get this healed. She does tend to sleep on her right side exclusively due to the fact that she is not able to lay any other way due to pain. She has issues with her right hip. She is a diabetic her  last hemoglobin A1c was 5.6 however this should not be affecting her ability to heal she also had an MRI in July which was negative for osteomyelitis. Overall everything checks out okay but nonetheless she is still experiencing the open wound that she just cannot get closed. She does wear oxygen 24/7. She has a history of congestive heart failure, COPD, and again the diabetes mellitus type 2. 09/29/2021 upon evaluation today patient appears to be doing roughly the same medial movement better as far as some of the necrotic tissue on the surface of the wound is concerned it is kind of gently coming off little by little. With that being said I do not see any signs of active infection systemically at this point which is good news. 10/06/2021 upon evaluation today patient appears to be doing excellent in regard to her wound from an infection standpoint I do not see any issues here currently. Unfortunately she continues develop slough over the surface of the wound where using collagen I am not certain that skin to be the best way to go going forward. I really think she may do well with Iodoflex. 10/13/2021 upon evaluation today patient appears to be doing well with regard to her wound. I actually feel like this is showing signs of improvement with regard to the overall appearance I do believe the Iodoflex has been helpful. 10/20/2021 upon evaluation today patient appears to be doing a little better in regard to her ankle ulcer. With that being said she has a new wound on her hand as well due to skin tear that occurred on the way home last week after I saw her. Fortunately there does not appear to be any evidence of active infection at this time. No fevers, chills, nausea, vomiting, or diarrhea. 10/27/2021 upon evaluation today 10/27/2021 upon evaluation today patient appears  to be doing better in regard to the wound on the hand as well as the ankle both are showing signs of being smaller which is great news.  I do not see any signs of infection which is also great news. In general I think that we are headed in the appropriate direction. 11/03/21 upon evaluation today patient appears to be doing well with regard to her wounds. She has been tolerating the dressing changes without complication. Fortunately there does not appear to be any signs of active infection at this time which is great news. No fevers, chills, nausea, vomiting, or diarrhea. 11/20/2021 upon evaluation today patient appears to be doing decently well in regard to her wounds. This is good news. Fortunately the hand is almost completely healed and the foot is smaller although this week it is about the same compared to what we saw previous. I do not see any evidence of infection there is little bit of dry skin we will continue to remove this is still taking a little bit longer than what I would expect to heal this makes me concerned about the fact of possibly needing to look into a more formal arterial study with regard to her arterial flow to see what they stand. 11/27/2021 upon evaluation today patient appears to be doing well with regard to her wound although is not significantly smaller is also not any bigger which is good news. 12/05/2021 upon evaluation today patient's wound is actually showing signs of improvement and looks to be doing quite well at about half the size for at least 60% of the size that it was last week. Overall I am extremely pleased with that improvement. 12/19/2021 upon evaluation today patient actually appears to be healed based on what we are seeing I do not see any signs of active infection at this time. No fevers, chills, nausea, vomiting, or diarrhea. Readmission: 05-29-2022 upon evaluation today patient appears to be doing well with regard to her wound. This is actually a new injury that she sustained when she had her shin hit on a metal chair that was beside her bed. Subsequently the patient tells me that this  happened around May 18. They have been putting Vaseline on this and using the bordered foam dressings which has done decently well for her up to this point. With that being said she tells me she is having some discomfort but not nearly as bad as what she previously noted with her hand. That is what I was seeing her previous. Patient's past medical history really has not changed everything remains essentially the same. She does have a history of congestive heart failure, COPD, and diabetes mellitus type 2. She also does have oxygen 24/7. Her most recent hemoglobin A1c was on March 22 and was 6.3. 6/13; initially traumatic wound on her left upper lower leg. She has been using Xeroform covered with a border foam dressing. This is nicely epithelializing 06-26-2022 upon evaluation today patient appears to be doing well currently in regard to her wound. In fact this appears to be completely healed based on what I am seeing. I do not see any signs of active infection locally or systemically which is great news. No fevers, chills, nausea, vomiting, or diarrhea. MALARY, AYLESWORTH ([id number]) Electronic Signature(s) Signed: 06/27/2022 5:09:40 PM By: Worthy Keeler PA-C Entered By: Worthy Keeler on 06/27/2022 17:09:40 [name] ([id number]) -------------------------------------------------------------------------------- Physical Exam Details Patient Name: [name]. Date of Service: 06/26/2022 1:00 PM Medical Record Number:  [id number] Patient Account Number: [id number] Date of Birth/Sex: [birth year] (81 y.o. F) Treating RN: Levora Dredge Primary Care Provider: Frazier Richards Other Clinician: Referring Provider: Frazier Richards Treating Provider/Extender: Skipper Cliche in Treatment: 4 Constitutional Well-nourished and well-hydrated in no acute distress. Respiratory normal breathing without difficulty. Psychiatric this patient is able to make decisions and demonstrates  good insight into disease process. Alert and Oriented x 3. pleasant and cooperative. Notes Upon inspection patient's wound bed showed signs of complete epithelization. There does not appear to be any evidence of active infection locally or systemically which is great news and overall very pleased with where we stand currently. Electronic Signature(s) Signed: 06/27/2022 5:10:03 PM By: Worthy Keeler PA-C Entered By: Worthy Keeler on 06/27/2022 17:10:03 [name] ([id number]) -------------------------------------------------------------------------------- Physician Orders Details Patient Name: [name]. Date of Service: 06/26/2022 1:00 PM Medical Record Number: [id number] Patient Account Number: [id number] Date of Birth/Sex: 07-18-1941 (81 y.o. F) Treating RN: Levora Dredge Primary Care Provider: Frazier Richards Other Clinician: Referring Provider: Frazier Richards Treating Provider/Extender: Skipper Cliche in Treatment: 4 Verbal / Phone Orders: No Diagnosis Coding ICD-10 Coding Code Description E11.622 Type 2 diabetes mellitus with other skin ulcer S81.812A Laceration without foreign body, left lower leg, initial encounter L97.822 Non-pressure chronic ulcer of other part of left lower leg with fat layer exposed J44.9 Chronic obstructive pulmonary disease, unspecified I50.42 Chronic combined systolic (congestive) and diastolic (congestive) heart failure Z99.81 Dependence on supplemental oxygen Discharge From [facility] o Discharge from [facility] - your wound is healed, please call if any issues arise with healed area. Please do wear protective clothing to keep legs covered. Shin sleeve or upper extremity sleeve recommended to help protect lower legs from bumps, print out provided. Electronic Signature(s) Signed: 06/26/2022 3:29:54 PM By: Levora Dredge Signed: 06/29/2022 4:59:17 PM By: Worthy Keeler PA-C Entered By: Levora Dredge on 06/26/2022 13:17:59 [name] ([id number]) -------------------------------------------------------------------------------- Problem List Details Patient Name: [name]. Date of Service: 06/26/2022 1:00 PM Medical Record Number: [id number] Patient Account Number: [id number] Date of Birth/Sex: [birth year] (81 y.o. F) Treating RN: Levora Dredge Primary Care Provider: Frazier Richards Other Clinician: Referring Provider: Frazier Richards Treating Provider/Extender: Skipper Cliche in Treatment: 4 Active Problems ICD-10 Encounter Code Description Active Date MDM Diagnosis E11.622 Type 2 diabetes mellitus with other skin ulcer 05/29/2022 No Yes S81.812A Laceration without foreign body, left lower leg, initial encounter 05/29/2022 No Yes L97.822 Non-pressure chronic ulcer of other part of left lower leg with fat layer 05/29/2022 No Yes exposed J44.9 Chronic obstructive pulmonary disease, unspecified 05/29/2022 No Yes I50.42 Chronic combined systolic (congestive) and diastolic (congestive) heart 05/29/2022 No Yes failure Z99.81 Dependence on supplemental oxygen 05/29/2022 No Yes Inactive Problems Resolved Problems Electronic Signature(s) Signed: 06/26/2022 1:14:00 PM By: Worthy Keeler PA-C Entered By: Worthy Keeler on 06/26/2022 13:14:00 [name] ([id number]) -------------------------------------------------------------------------------- Progress Note Details Patient Name: [name]. Date of Service: 06/26/2022 1:00 PM Medical Record Number: [id number] Patient Account Number: [id number] Date of Birth/Sex: 1941/09/23 (81 y.o. F) Treating RN: Levora Dredge Primary Care Provider: Frazier Richards Other Clinician: Referring Provider: Frazier Richards Treating Provider/Extender: Skipper Cliche in Treatment: 4 Subjective Chief Complaint Information obtained from Patient Left LE Ulcer History of Present Illness (HPI) 09/21/2021 upon  evaluation today patient appears to be doing somewhat poorly in regard to her wound on her right lateral ankle. She has been tolerating the dressing changes without complication. Fortunately there does not  not appear to be any signs of active infection systemically at this time which is great news. Likely also do not see any major issues which is good news. Nonetheless the biggest problem is that even with attempting to treat this and try to get things under control on her own she is really not been able to get this healed. She does tend to sleep on her right side exclusively due to the fact that she is not able to lay any other way due to pain. She has issues with her right hip. She is a diabetic her last hemoglobin A1c was 5.6 however this should not be affecting her ability to heal she also had an MRI in July which was negative for osteomyelitis. Overall everything checks out okay but nonetheless she is still experiencing the open wound that she just cannot get closed. She does wear oxygen 24/7. She has a history of congestive heart failure, COPD, and again the diabetes mellitus type 2. 09/29/2021 upon evaluation today patient appears to be doing roughly the same medial movement better as far as some of the necrotic tissue on the surface of the wound is concerned it is kind of gently coming off little by little. With that being said I do not see any signs of active infection systemically at this point which is good news. 10/06/2021 upon evaluation today patient appears to be doing excellent in regard to her wound from an infection standpoint I do not see any issues here currently. Unfortunately she continues develop slough over the surface of the wound where using collagen I am not certain that skin to be the best way to go going forward. I really think she may do well with Iodoflex. 10/13/2021 upon evaluation today patient appears to be doing well with regard to her wound. I actually feel like this  is showing signs of improvement with regard to the overall appearance I do believe the Iodoflex has been helpful. 10/20/2021 upon evaluation today patient appears to be doing a little better in regard to her ankle ulcer. With that being said she has a new wound on her hand as well due to skin tear that occurred on the way home last week after I saw her. Fortunately there does not appear to be any evidence of active infection at this time. No fevers, chills, nausea, vomiting, or diarrhea. 10/27/2021 upon evaluation today 10/27/2021 upon evaluation today patient appears to be doing better in regard to the wound on the hand as well as the ankle both are showing signs of being smaller which is great news. I do not see any signs of infection which is also great news. In general I think that we are headed in the appropriate direction. 11/03/21 upon evaluation today patient appears to be doing well with regard to her wounds. She has been tolerating the dressing changes without complication. Fortunately there does not appear to be any signs of active infection at this time which is great news. No fevers, chills, nausea, vomiting, or diarrhea. 11/20/2021 upon evaluation today patient appears to be doing decently well in regard to her wounds. This is good news. Fortunately the hand is almost completely healed and the foot is smaller although this week it is about the same compared to what we saw previous. I do not see any evidence of infection there is little bit of dry skin we will continue to remove this is still taking a little bit longer than what I would expect to heal this  makes me concerned about the fact of possibly needing to look into a more formal arterial study with regard to her arterial flow to see what they stand. 11/27/2021 upon evaluation today patient appears to be doing well with regard to her wound although is not significantly smaller is also not any bigger which is good news. 12/05/2021  upon evaluation today patient's wound is actually showing signs of improvement and looks to be doing quite well at about half the size for at least 60% of the size that it was last week. Overall I am extremely pleased with that improvement. 12/19/2021 upon evaluation today patient actually appears to be healed based on what we are seeing I do not see any signs of active infection at this time. No fevers, chills, nausea, vomiting, or diarrhea. Readmission: 05-29-2022 upon evaluation today patient appears to be doing well with regard to her wound. This is actually a new injury that she sustained when she had her shin hit on a metal chair that was beside her bed. Subsequently the patient tells me that this happened around May 18. They have been putting Vaseline on this and using the bordered foam dressings which has done decently well for her up to this point. With that being said she tells me she is having some discomfort but not nearly as bad as what she previously noted with her hand. That is what I was seeing her previous. Patient's past medical history really has not changed everything remains essentially the same. She does have a history of congestive heart failure, COPD, and diabetes mellitus type 2. She also does have oxygen 24/7. Her most recent hemoglobin A1c was on March 22 and was 6.3. 6/13; initially traumatic wound on her left upper lower leg. She has been using Xeroform covered with a border foam dressing. This is nicely epithelializing ASHLIN, KREPS ([id number]) 06-26-2022 upon evaluation today patient appears to be doing well currently in regard to her wound. In fact this appears to be completely healed based on what I am seeing. I do not see any signs of active infection locally or systemically which is great news. No fevers, chills, nausea, vomiting, or diarrhea. Objective Constitutional Well-nourished and well-hydrated in no acute distress. Vitals Time Taken: 1:06 PM, Height:  62 in, Weight: 104 lbs, BMI: 19, Temperature: 98.1 F, Pulse: 64 bpm, Respiratory Rate: 18 breaths/min, Blood Pressure: 114/54 mmHg. Respiratory normal breathing without difficulty. Psychiatric this patient is able to make decisions and demonstrates good insight into disease process. Alert and Oriented x 3. pleasant and cooperative. General Notes: Upon inspection patient's wound bed showed signs of complete epithelization. There does not appear to be any evidence of active infection locally or systemically which is great news and overall very pleased with where we stand currently. Integumentary (Hair, Skin) Wound #3 status is Healed - Epithelialized. Original cause of wound was Trauma. The date acquired was: 05/17/2022. The wound has been in treatment 4 weeks. The wound is located on the Left,Proximal Lower Leg. The wound measures 0cm length x 0cm width x 0cm depth; 0cm^2 area and 0cm^3 volume. There is no tunneling or undermining noted. There is a none present amount of drainage noted. There is no granulation within the wound bed. There is no necrotic tissue within the wound bed. Assessment Active Problems ICD-10 Type 2 diabetes mellitus with other skin ulcer Laceration without foreign body, left lower leg, initial encounter Non-pressure chronic ulcer of other part of left lower leg with fat layer exposed Chronic  obstructive pulmonary disease, unspecified Chronic combined systolic (congestive) and diastolic (congestive) heart failure Dependence on supplemental oxygen Plan Discharge From [facility] Services: Discharge from [place] [facility] - your wound is healed, please call if any issues arise with healed area. Please do wear protective clothing to keep legs covered. Shin sleeve or upper extremity sleeve recommended to help protect lower legs from bumps, print out provided. 1. I would recommend that we go ahead and discontinue wound care services as the patient appears to be  completely healed and she is in agreement with that plan. Office if anything changes she can let me know but otherwise I am very pleased that this is healing I am hopeful that they will stay so she has any issues she should definitely contact the office and let me know. We will see her back for follow-up visit as needed. MAKYLAH, BOSSARD ([id number]) Electronic Signature(s) Signed: 06/27/2022 5:10:21 PM By: Worthy Keeler PA-C Entered By: Worthy Keeler on 06/27/2022 17:10:21 DESTANEY, SARKIS ([id number]) -------------------------------------------------------------------------------- SuperBill Details Patient Name: [name]. Date of Service: 06/26/2022 Medical Record Number: [id number] Patient Account Number: [id number] Date of Birth/Sex: [birth year] (81 y.o. F) Treating RN: Levora Dredge Primary Care Provider: Frazier Richards Other Clinician: Referring Provider: Frazier Richards Treating Provider/Extender: Skipper Cliche in Treatment: 4 Diagnosis Coding ICD-10 Codes Code Description E11.622 Type 2 diabetes mellitus with other skin ulcer S81.812A Laceration without foreign body, left lower leg, initial encounter L97.822 Non-pressure chronic ulcer of other part of left lower leg with fat layer exposed J44.9 Chronic obstructive pulmonary disease, unspecified I50.42 Chronic combined systolic (congestive) and diastolic (congestive) heart failure Z99.81 Dependence on supplemental oxygen Facility Procedures CPT4 Code: [id number] Description: [phone] - WOUND CARE VISIT-LEV 2 EST PT Modifier: Quantity: 1 Physician Procedures CPT4 Code: [id number] Description: 29518 - WC PHYS LEVEL 2 - EST PT Modifier: Quantity: 1 CPT4 Code: Description: ICD-10 Diagnosis Description E11.622 Type 2 diabetes mellitus with other skin ulcer S81.812A Laceration without foreign body, left lower leg, initial encounter L97.822 Non-pressure chronic ulcer of other part of left lower leg with fat lay   J44.9 Chronic obstructive pulmonary disease, unspecified Modifier: er exposed Quantity: Electronic Signature(s) Signed: 06/27/2022 5:10:53 PM By: Worthy Keeler PA-C Previous Signature: 06/27/2022 5:10:40 PM Version By: Worthy Keeler PA-C Entered By: Worthy Keeler on 06/27/2022 17:10:53

## 2022-07-04 DIAGNOSIS — F325 Major depressive disorder, single episode, in full remission: Secondary | ICD-10-CM | POA: Diagnosis not present

## 2022-07-04 DIAGNOSIS — E1169 Type 2 diabetes mellitus with other specified complication: Secondary | ICD-10-CM | POA: Diagnosis not present

## 2022-07-04 DIAGNOSIS — E43 Unspecified severe protein-calorie malnutrition: Secondary | ICD-10-CM | POA: Diagnosis not present

## 2022-07-04 DIAGNOSIS — N1831 Chronic kidney disease, stage 3a: Secondary | ICD-10-CM | POA: Diagnosis not present

## 2022-07-04 DIAGNOSIS — I5030 Unspecified diastolic (congestive) heart failure: Secondary | ICD-10-CM | POA: Diagnosis not present

## 2022-07-04 DIAGNOSIS — E1122 Type 2 diabetes mellitus with diabetic chronic kidney disease: Secondary | ICD-10-CM | POA: Diagnosis not present

## 2022-07-04 DIAGNOSIS — J9611 Chronic respiratory failure with hypoxia: Secondary | ICD-10-CM | POA: Diagnosis not present

## 2022-07-04 DIAGNOSIS — E785 Hyperlipidemia, unspecified: Secondary | ICD-10-CM | POA: Diagnosis not present

## 2022-08-10 DIAGNOSIS — M1611 Unilateral primary osteoarthritis, right hip: Secondary | ICD-10-CM | POA: Diagnosis not present

## 2022-08-10 DIAGNOSIS — J449 Chronic obstructive pulmonary disease, unspecified: Secondary | ICD-10-CM | POA: Diagnosis not present

## 2022-08-10 DIAGNOSIS — Z9981 Dependence on supplemental oxygen: Secondary | ICD-10-CM | POA: Diagnosis not present

## 2022-08-10 DIAGNOSIS — J9610 Chronic respiratory failure, unspecified whether with hypoxia or hypercapnia: Secondary | ICD-10-CM | POA: Diagnosis not present

## 2022-08-10 DIAGNOSIS — Z87891 Personal history of nicotine dependence: Secondary | ICD-10-CM | POA: Diagnosis not present

## 2022-10-11 ENCOUNTER — Ambulatory Visit: Payer: PPO | Admitting: Internal Medicine

## 2022-10-24 DIAGNOSIS — E785 Hyperlipidemia, unspecified: Secondary | ICD-10-CM | POA: Diagnosis not present

## 2022-10-24 DIAGNOSIS — E1122 Type 2 diabetes mellitus with diabetic chronic kidney disease: Secondary | ICD-10-CM | POA: Diagnosis not present

## 2022-10-24 DIAGNOSIS — R609 Edema, unspecified: Secondary | ICD-10-CM | POA: Diagnosis not present

## 2022-10-24 DIAGNOSIS — E1169 Type 2 diabetes mellitus with other specified complication: Secondary | ICD-10-CM | POA: Diagnosis not present

## 2022-10-24 DIAGNOSIS — I5030 Unspecified diastolic (congestive) heart failure: Secondary | ICD-10-CM | POA: Diagnosis not present

## 2022-10-24 DIAGNOSIS — Z23 Encounter for immunization: Secondary | ICD-10-CM | POA: Diagnosis not present

## 2022-10-24 DIAGNOSIS — I10 Essential (primary) hypertension: Secondary | ICD-10-CM | POA: Diagnosis not present

## 2022-10-24 DIAGNOSIS — J42 Unspecified chronic bronchitis: Secondary | ICD-10-CM | POA: Diagnosis not present

## 2022-10-24 DIAGNOSIS — N1831 Chronic kidney disease, stage 3a: Secondary | ICD-10-CM | POA: Diagnosis not present

## 2022-10-25 DIAGNOSIS — N1831 Chronic kidney disease, stage 3a: Secondary | ICD-10-CM | POA: Diagnosis not present

## 2022-10-25 DIAGNOSIS — I10 Essential (primary) hypertension: Secondary | ICD-10-CM | POA: Diagnosis not present

## 2022-10-25 DIAGNOSIS — I5030 Unspecified diastolic (congestive) heart failure: Secondary | ICD-10-CM | POA: Diagnosis not present

## 2022-10-25 DIAGNOSIS — E1121 Type 2 diabetes mellitus with diabetic nephropathy: Secondary | ICD-10-CM | POA: Diagnosis not present

## 2022-10-25 DIAGNOSIS — E785 Hyperlipidemia, unspecified: Secondary | ICD-10-CM | POA: Diagnosis not present

## 2022-10-25 DIAGNOSIS — E1169 Type 2 diabetes mellitus with other specified complication: Secondary | ICD-10-CM | POA: Diagnosis not present

## 2022-10-25 DIAGNOSIS — J9611 Chronic respiratory failure with hypoxia: Secondary | ICD-10-CM | POA: Diagnosis not present

## 2022-10-29 ENCOUNTER — Encounter (INDEPENDENT_AMBULATORY_CARE_PROVIDER_SITE_OTHER): Payer: Self-pay

## 2022-11-26 DIAGNOSIS — E1129 Type 2 diabetes mellitus with other diabetic kidney complication: Secondary | ICD-10-CM | POA: Diagnosis not present

## 2022-11-26 DIAGNOSIS — E1151 Type 2 diabetes mellitus with diabetic peripheral angiopathy without gangrene: Secondary | ICD-10-CM | POA: Diagnosis not present

## 2022-11-26 DIAGNOSIS — F339 Major depressive disorder, recurrent, unspecified: Secondary | ICD-10-CM | POA: Diagnosis not present

## 2022-11-26 DIAGNOSIS — Z66 Do not resuscitate: Secondary | ICD-10-CM | POA: Diagnosis not present

## 2022-12-05 DIAGNOSIS — N1831 Chronic kidney disease, stage 3a: Secondary | ICD-10-CM | POA: Diagnosis not present

## 2022-12-05 DIAGNOSIS — I5030 Unspecified diastolic (congestive) heart failure: Secondary | ICD-10-CM | POA: Diagnosis not present

## 2022-12-05 DIAGNOSIS — J9611 Chronic respiratory failure with hypoxia: Secondary | ICD-10-CM | POA: Diagnosis not present

## 2022-12-05 DIAGNOSIS — E785 Hyperlipidemia, unspecified: Secondary | ICD-10-CM | POA: Diagnosis not present

## 2022-12-05 DIAGNOSIS — E1121 Type 2 diabetes mellitus with diabetic nephropathy: Secondary | ICD-10-CM | POA: Diagnosis not present

## 2022-12-05 DIAGNOSIS — E1169 Type 2 diabetes mellitus with other specified complication: Secondary | ICD-10-CM | POA: Diagnosis not present

## 2022-12-05 DIAGNOSIS — I10 Essential (primary) hypertension: Secondary | ICD-10-CM | POA: Diagnosis not present

## 2022-12-10 ENCOUNTER — Other Ambulatory Visit: Payer: Self-pay | Admitting: Internal Medicine

## 2022-12-10 ENCOUNTER — Ambulatory Visit: Payer: PPO | Admitting: Internal Medicine

## 2022-12-25 DIAGNOSIS — E1122 Type 2 diabetes mellitus with diabetic chronic kidney disease: Secondary | ICD-10-CM | POA: Diagnosis not present

## 2022-12-25 DIAGNOSIS — E038 Other specified hypothyroidism: Secondary | ICD-10-CM | POA: Diagnosis not present

## 2022-12-25 DIAGNOSIS — I10 Essential (primary) hypertension: Secondary | ICD-10-CM | POA: Diagnosis not present

## 2022-12-25 DIAGNOSIS — N1831 Chronic kidney disease, stage 3a: Secondary | ICD-10-CM | POA: Diagnosis not present

## 2022-12-27 ENCOUNTER — Other Ambulatory Visit: Payer: Self-pay

## 2022-12-27 ENCOUNTER — Emergency Department
Admission: EM | Admit: 2022-12-27 | Discharge: 2022-12-27 | Disposition: A | Discharge status: Home / Self Care | Payer: PPO | Attending: Emergency Medicine | Admitting: Emergency Medicine

## 2022-12-27 ENCOUNTER — Emergency Department: Payer: PPO

## 2022-12-27 DIAGNOSIS — E1169 Type 2 diabetes mellitus with other specified complication: Secondary | ICD-10-CM | POA: Diagnosis not present

## 2022-12-27 DIAGNOSIS — J9611 Chronic respiratory failure with hypoxia: Secondary | ICD-10-CM | POA: Diagnosis not present

## 2022-12-27 DIAGNOSIS — I509 Heart failure, unspecified: Secondary | ICD-10-CM | POA: Insufficient documentation

## 2022-12-27 DIAGNOSIS — E43 Unspecified severe protein-calorie malnutrition: Secondary | ICD-10-CM | POA: Diagnosis not present

## 2022-12-27 DIAGNOSIS — J440 Chronic obstructive pulmonary disease with acute lower respiratory infection: Secondary | ICD-10-CM | POA: Diagnosis not present

## 2022-12-27 DIAGNOSIS — J441 Chronic obstructive pulmonary disease with (acute) exacerbation: Secondary | ICD-10-CM | POA: Diagnosis not present

## 2022-12-27 DIAGNOSIS — I110 Hypertensive heart disease with heart failure: Secondary | ICD-10-CM | POA: Insufficient documentation

## 2022-12-27 DIAGNOSIS — R0602 Shortness of breath: Secondary | ICD-10-CM | POA: Diagnosis not present

## 2022-12-27 DIAGNOSIS — E119 Type 2 diabetes mellitus without complications: Secondary | ICD-10-CM | POA: Insufficient documentation

## 2022-12-27 DIAGNOSIS — R001 Bradycardia, unspecified: Secondary | ICD-10-CM | POA: Diagnosis not present

## 2022-12-27 DIAGNOSIS — J42 Unspecified chronic bronchitis: Secondary | ICD-10-CM | POA: Diagnosis not present

## 2022-12-27 DIAGNOSIS — Z1152 Encounter for screening for COVID-19: Secondary | ICD-10-CM | POA: Diagnosis not present

## 2022-12-27 DIAGNOSIS — E785 Hyperlipidemia, unspecified: Secondary | ICD-10-CM | POA: Diagnosis not present

## 2022-12-27 DIAGNOSIS — N1831 Chronic kidney disease, stage 3a: Secondary | ICD-10-CM | POA: Diagnosis not present

## 2022-12-27 DIAGNOSIS — R0902 Hypoxemia: Secondary | ICD-10-CM | POA: Diagnosis not present

## 2022-12-27 DIAGNOSIS — F325 Major depressive disorder, single episode, in full remission: Secondary | ICD-10-CM | POA: Diagnosis not present

## 2022-12-27 DIAGNOSIS — J40 Bronchitis, not specified as acute or chronic: Secondary | ICD-10-CM

## 2022-12-27 DIAGNOSIS — R0689 Other abnormalities of breathing: Secondary | ICD-10-CM | POA: Diagnosis not present

## 2022-12-27 DIAGNOSIS — R069 Unspecified abnormalities of breathing: Secondary | ICD-10-CM | POA: Diagnosis not present

## 2022-12-27 DIAGNOSIS — J439 Emphysema, unspecified: Secondary | ICD-10-CM | POA: Diagnosis not present

## 2022-12-27 DIAGNOSIS — E1122 Type 2 diabetes mellitus with diabetic chronic kidney disease: Secondary | ICD-10-CM | POA: Diagnosis not present

## 2022-12-27 DIAGNOSIS — E038 Other specified hypothyroidism: Secondary | ICD-10-CM | POA: Diagnosis not present

## 2022-12-27 DIAGNOSIS — I10 Essential (primary) hypertension: Secondary | ICD-10-CM | POA: Diagnosis not present

## 2022-12-27 DIAGNOSIS — I5030 Unspecified diastolic (congestive) heart failure: Secondary | ICD-10-CM | POA: Diagnosis not present

## 2022-12-27 LAB — RESP PANEL BY RT-PCR (RSV, FLU A&B, COVID)  RVPGX2
Influenza A by PCR: NEGATIVE
Influenza B by PCR: NEGATIVE
Resp Syncytial Virus by PCR: NEGATIVE
SARS Coronavirus 2 by RT PCR: NEGATIVE

## 2022-12-27 LAB — CBC WITH DIFFERENTIAL/PLATELET
Abs Immature Granulocytes: 0.08 10*3/uL — ABNORMAL HIGH (ref 0.00–0.07)
Basophils Absolute: 0 10*3/uL (ref 0.0–0.1)
Basophils Relative: 0 %
Eosinophils Absolute: 0.2 10*3/uL (ref 0.0–0.5)
Eosinophils Relative: 1 %
HCT: 31.8 % — ABNORMAL LOW (ref 36.0–46.0)
Hemoglobin: 9.5 g/dL — ABNORMAL LOW (ref 12.0–15.0)
Immature Granulocytes: 1 %
Lymphocytes Relative: 4 %
Lymphs Abs: 0.5 10*3/uL — ABNORMAL LOW (ref 0.7–4.0)
MCH: 27.5 pg (ref 26.0–34.0)
MCHC: 29.9 g/dL — ABNORMAL LOW (ref 30.0–36.0)
MCV: 91.9 fL (ref 80.0–100.0)
Monocytes Absolute: 0.7 10*3/uL (ref 0.1–1.0)
Monocytes Relative: 6 %
Neutro Abs: 11.7 10*3/uL — ABNORMAL HIGH (ref 1.7–7.7)
Neutrophils Relative %: 88 %
Platelets: 170 10*3/uL (ref 150–400)
RBC: 3.46 MIL/uL — ABNORMAL LOW (ref 3.87–5.11)
RDW: 14.8 % (ref 11.5–15.5)
WBC: 13.2 10*3/uL — ABNORMAL HIGH (ref 4.0–10.5)
nRBC: 0 % (ref 0.0–0.2)

## 2022-12-27 LAB — TROPONIN I (HIGH SENSITIVITY): Troponin I (High Sensitivity): 15 ng/L (ref <18)

## 2022-12-27 LAB — COMPREHENSIVE METABOLIC PANEL
ALT: 7 U/L (ref 0–44)
AST: 11 U/L — ABNORMAL LOW (ref 15–41)
Albumin: 3.8 g/dL (ref 3.5–5.0)
Alkaline Phosphatase: 76 U/L (ref 38–126)
Anion gap: 7 (ref 5–15)
BUN: 27 mg/dL — ABNORMAL HIGH (ref 8–23)
CO2: 27 mmol/L (ref 22–32)
Calcium: 10.2 mg/dL (ref 8.9–10.3)
Chloride: 106 mmol/L (ref 98–111)
Creatinine, Ser: 1.4 mg/dL — ABNORMAL HIGH (ref 0.44–1.00)
GFR, Estimated: 37 mL/min — ABNORMAL LOW (ref >60)
Glucose, Bld: 193 mg/dL — ABNORMAL HIGH (ref 70–99)
Potassium: 4.5 mmol/L (ref 3.5–5.1)
Sodium: 140 mmol/L (ref 135–145)
Total Bilirubin: 0.6 mg/dL (ref 0.3–1.2)
Total Protein: 8 g/dL (ref 6.5–8.1)

## 2022-12-27 MED ORDER — AZITHROMYCIN 500 MG PO TABS
500.0000 mg | ORAL_TABLET | Freq: Once | ORAL | Status: AC
  Administered 2022-12-27: 500 mg via ORAL
  Filled 2022-12-27: qty 1

## 2022-12-27 MED ORDER — IPRATROPIUM-ALBUTEROL 0.5-2.5 (3) MG/3ML IN SOLN
3.0000 mL | Freq: Once | RESPIRATORY_TRACT | Status: AC
  Administered 2022-12-27: 3 mL via RESPIRATORY_TRACT
  Filled 2022-12-27: qty 3

## 2022-12-27 MED ORDER — PREDNISONE 10 MG PO TABS: 10.0000 mg | ORAL_TABLET | Freq: Every day | ORAL | 0 refills | Status: DC

## 2022-12-27 MED ORDER — METHYLPREDNISOLONE SODIUM SUCC 125 MG IJ SOLR
125.0000 mg | Freq: Once | INTRAMUSCULAR | Status: AC
  Administered 2022-12-27: 125 mg via INTRAVENOUS
  Filled 2022-12-27: qty 2

## 2022-12-27 MED ORDER — AZITHROMYCIN 250 MG PO TABS: ORAL_TABLET | ORAL | 0 refills | Status: AC

## 2022-12-27 NOTE — ED Notes (Signed)
Pt 97% on 5L, md notified, decreased pt to 3L via [place] per md instructions.

## 2022-12-27 NOTE — ED Triage Notes (Signed)
Pt to er via ems, per ems pt went to pmd earlier today because she wasn't feeling well, states that on the way home she had increasing shortness of breath, pt presents with iv in L ac on NRB, pt satting 99% on NRB, pt talking in full sentences

## 2022-12-27 NOTE — ED Notes (Signed)
Pt tolerating 3L [place] without distress. EDP aware. Pt verbalized understanding of D/C information and escorted to the waiting room via wheelchair.

## 2022-12-27 NOTE — Discharge Instructions (Addendum)
As we discussed please take your antibiotics and steroid as prescribed for its entire course.  Please follow-up with your doctor in 2 to 3 days for recheck/reevaluation.  Return to the emergency department for any return of/worsening shortness of breath, any chest pain, or any other symptom personally concerning to yourself.

## 2022-12-27 NOTE — ED Provider Notes (Addendum)
[facility] Provider Note    Event Date/Time   First MD Initiated Contact with Patient 12/27/22 1642     (approximate)  History   Chief Complaint: Shortness of Breath  HPI  [name] is a 81 y.o. female with a past medical history of CHF, COPD, diabetes, hypertension, presents to the emergency department for shortness of breath.  According to the patient for the past several days she has been experiencing more shortness of breath.  Patient denies any chest pain.  Does state a slight cough but states that is fairly chronic.  Denies any fever denies any congestion.  No abdominal pain.  Patient states she wears 3 L nasal cannula oxygen at baseline.  Currently on 3 L satting 93 to 95%.  Physical Exam   Triage Vital Signs: ED Triage Vitals  Enc Vitals Group     BP 12/27/22 1618 (!) 150/80     Pulse Rate 12/27/22 1618 (!) 103     Resp 12/27/22 1618 (!) 22     Temp 12/27/22 1618 98.7 F (37.1 C)     Temp Source 12/27/22 1618 Oral     SpO2 12/27/22 1618 100 %     Weight 12/27/22 1615 111 lb (50.3 kg)     Height 12/27/22 1615 '5\' 2"'$  (1.575 m)     Head Circumference --      Peak Flow --      Pain Score 12/27/22 1614 0     Pain Loc --      Pain Edu? --      Excl. in [place]? --     Most recent vital signs: Vitals:   12/27/22 1618  BP: (!) 150/80  Pulse: (!) 103  Resp: (!) 22  Temp: 98.7 F (37.1 C)  SpO2: 100%    General: Awake, no distress.  CV:  Good peripheral perfusion.  Regular rate and rhythm  Resp:  Normal effort.  Equal breath sounds bilaterally.  Abd:  No distention.  Soft, nontender.  No rebound or guarding.   ED Results / Procedures / Treatments   EKG  EKG viewed and interpreted by myself shows sinus tachycardia 104 bpm with a narrow QRS, normal axis, normal intervals, nonspecific but no concerning ST changes.  RADIOLOGY  I have reviewed and interpreted the chest x-ray images.  Patient appears to have some haziness in the  bilateral lower bases but no obvious consolidation. Radiology is read the chest x-ray is chronic emphysema.  No acute disease.   MEDICATIONS ORDERED IN ED: Medications  methylPREDNISolone sodium succinate (SOLU-MEDROL) 125 mg/2 mL injection 125 mg (has no administration in time range)  ipratropium-albuterol (DUONEB) 0.5-2.5 (3) MG/3ML nebulizer solution 3 mL (has no administration in time range)  ipratropium-albuterol (DUONEB) 0.5-2.5 (3) MG/3ML nebulizer solution 3 mL (has no administration in time range)     IMPRESSION / MDM / ASSESSMENT AND PLAN / ED COURSE  I reviewed the triage vital signs and the nursing notes.  Patient's presentation is most consistent with acute presentation with potential threat to life or bodily function.  Patient presents emergency department for shortness of breath.  Patient states she had to pull over and call EMS due to shortness of breath.  Patient has a history of COPD wears 3 L of oxygen 24/7.  Patient denies any chest pain does state occasional cough.  We will obtain a chest x-ray to evaluate for pneumonia, pneumothorax or other consolidation.  We will obtain a COVID/flu/RSV swab to  evaluate for viral infections.  We will check labs including cardiac enzymes to help rule out ACS.  We will dose Solu-Medrol, DuoNebs and continue to closely monitor.  Patient agreeable to plan of care.  Patient appears much better.  Patient's labs have resulted showing mild leukocytosis white blood cell count of 13,000.  She does have an occasional wet sounding cough in the emergency department.  Patient's chemistry shows no significant findings.  Patient's troponin is normal COVID/flu/RSV are negative and chest x-ray shows no obvious consolidation or signs of pneumonia.  Patient currently satting 95% on her baseline 3 L.  Given the patient's reassuring workup, patient is also made it very clear that she does not want to be admitted to the hospital and wants to go home I believe she  is likely safe for discharge home.  Given the patient's wet sounding cough highly suspect more of an acute bronchitis which is exacerbating her COPD.  We will place the patient on Zithromax as well as a prednisone taper.  Have the patient follow-up with her doctor.  Patient is agreeable to this plan of care.  She will follow-up with her PCP within the next 2 to 3 days for recheck/reevaluation.  Patient continues to sat 93% on 3 L which is her baseline O2 requirement.  Patient states she is feeling better and wishes to go home.  Discussed with patient return precautions.  Patient agreeable to plan we will discharge per her wishes.  FINAL CLINICAL IMPRESSION(S) / ED DIAGNOSES   Dyspnea COPD exacerbation Acute bronchitis   Note:  This document was prepared using Dragon voice recognition software and may include unintentional dictation errors.   Harvest Dark, MD 12/27/22 1739    Harvest Dark, MD 12/27/22 1931

## 2023-01-03 DIAGNOSIS — E1169 Type 2 diabetes mellitus with other specified complication: Secondary | ICD-10-CM | POA: Diagnosis not present

## 2023-01-03 DIAGNOSIS — I10 Essential (primary) hypertension: Secondary | ICD-10-CM | POA: Diagnosis not present

## 2023-01-03 DIAGNOSIS — E1122 Type 2 diabetes mellitus with diabetic chronic kidney disease: Secondary | ICD-10-CM | POA: Diagnosis not present

## 2023-01-03 DIAGNOSIS — N1831 Chronic kidney disease, stage 3a: Secondary | ICD-10-CM | POA: Diagnosis not present

## 2023-01-03 DIAGNOSIS — I5030 Unspecified diastolic (congestive) heart failure: Secondary | ICD-10-CM | POA: Diagnosis not present

## 2023-01-03 DIAGNOSIS — E785 Hyperlipidemia, unspecified: Secondary | ICD-10-CM | POA: Diagnosis not present

## 2023-01-11 ENCOUNTER — Encounter: Payer: Self-pay | Admitting: *Deleted

## 2023-01-11 NOTE — Telephone Encounter (Signed)
This encounter was created in error - please disregard.

## 2023-03-01 DIAGNOSIS — I10 Essential (primary) hypertension: Secondary | ICD-10-CM | POA: Diagnosis not present

## 2023-03-01 DIAGNOSIS — I5030 Unspecified diastolic (congestive) heart failure: Secondary | ICD-10-CM | POA: Diagnosis not present

## 2023-03-01 DIAGNOSIS — N1831 Chronic kidney disease, stage 3a: Secondary | ICD-10-CM | POA: Diagnosis not present

## 2023-03-01 DIAGNOSIS — E1122 Type 2 diabetes mellitus with diabetic chronic kidney disease: Secondary | ICD-10-CM | POA: Diagnosis not present

## 2023-03-01 DIAGNOSIS — E1169 Type 2 diabetes mellitus with other specified complication: Secondary | ICD-10-CM | POA: Diagnosis not present

## 2023-03-01 DIAGNOSIS — E785 Hyperlipidemia, unspecified: Secondary | ICD-10-CM | POA: Diagnosis not present

## 2023-03-18 ENCOUNTER — Ambulatory Visit (INDEPENDENT_AMBULATORY_CARE_PROVIDER_SITE_OTHER): Payer: PPO | Admitting: Internal Medicine

## 2023-03-18 ENCOUNTER — Encounter: Payer: Self-pay | Admitting: Internal Medicine

## 2023-03-18 VITALS — BP 120/60 | HR 81 | Temp 97.8°F | Ht 62.0 in | Wt 112.0 lb

## 2023-03-18 DIAGNOSIS — J449 Chronic obstructive pulmonary disease, unspecified: Secondary | ICD-10-CM

## 2023-03-18 DIAGNOSIS — J9611 Chronic respiratory failure with hypoxia: Secondary | ICD-10-CM

## 2023-03-18 DIAGNOSIS — R0689 Other abnormalities of breathing: Secondary | ICD-10-CM

## 2023-03-18 NOTE — Patient Instructions (Addendum)
Continue inhalers as prescribed Continue oxygen as prescribed AVOID SECOND HAND SMOKING

## 2023-03-18 NOTE — Progress Notes (Signed)
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 (24/7) Mild obesity   DATA: Spirometry 2014: FEV1 0.78 liters (38% pred)  CT chest 07/06/13: mod - severe emphysema. Chronic RLL scarring CXR 05/12/15: Advanced chronic obstructive pulmonary disease with similar right basilar scarring. No acute findings CXR 04/04/17: No acute findings   INTERVAL HISTORY: Last visit 02/26/19.  No major pulmonary events since that time.   CC Follow-up COPD Follow-up chronic hypoxic respiratory failure Follow-up respiratory insufficiency   HPI Regarding COPD Currently on inhaler therapy with Advair and Spiriva Seems to be responding well to this regimen History of multiple COPD exacerbations in the past End  stage COPD with hypoxic respiratory failure  No exacerbation at this time No evidence of heart failure at this time No evidence or signs of infection at this time No respiratory distress No fevers, chills, nausea, vomiting, diarrhea No evidence of lower extremity edema No evidence hemoptysis  Patient continues to benefit for oxygen therapy She needs this for survival   Patient with  high risk for pulmonary complications for any type of procedure moving forward  Patient with a history of acute diastolic heart failure with pulmonary edema and CHF exacerbation in the past    BP 120/60 (BP Location: Left Arm, Cuff Size: Normal)   Pulse 81   Temp 97.8 F (36.6 C) (Temporal)   Ht 5\' 2"  (1.575 m)   Wt 112 lb (50.8 kg)   SpO2 91%   BMI 20.49 kg/m     Review of Systems: Gen: frail , thin HEENT: Denies blurred vision, double vision, ear pain, eye pain, hearing loss, nose bleeds, sore throat Cardiac:  No dizziness, chest pain or heaviness, chest tightness,edema, No JVD Resp:   No cough, -sputum production, -shortness of breath,-wheezing, -hemoptysis,  Other:  All other systems negative   Physical Examination:   General Appearance: No distress  EYES  PERRLA, EOM intact.   NECK Supple, No JVD Pulmonary: normal breath sounds, No wheezing.  CardiovascularNormal S1,S2.  No m/r/g.   Abdomen: Benign, Soft, non-tender. Neurology UE/LE 5/5 strength, no focal deficits Ext pulses intact, cap refill intact ALL OTHER ROS ARE NEGATIVE      Current Outpatient Medications:    acetaminophen (TYLENOL) 500 MG tablet, Take 500 mg by mouth every 6 (six) hours as needed. 2 in am and 2 at night, Disp: , Rfl:    albuterol (PROAIR HFA) 108 (90 Base) MCG/ACT inhaler, Inhale 2 puffs into the lungs every 6 (six) hours as needed for wheezing or shortness of breath., Disp: 54 g, Rfl: 1   atorvastatin (LIPITOR) 20 MG tablet, Take 20 mg by mouth daily., Disp: , Rfl:    carvedilol (COREG) 3.125 MG tablet, Take 2 tablets (6.25 mg total) by mouth 2 (two) times daily with a meal., Disp: 30 tablet, Rfl: 1   docusate sodium (COLACE) 100 MG capsule, Take 1 capsule (100 mg total) by mouth every other day., Disp: 10 capsule, Rfl: 0   ferrous sulfate 325 (65 FE) MG tablet, Take 1 tablet (325 mg total) by mouth every other day., Disp: 30 tablet, Rfl: 3   fluticasone-salmeterol (WIXELA INHUB) 250-50 MCG/ACT AEPB, INHALE 1 PUFF INTO THE LUNGS TWICE DAILY, Disp: 60 each, Rfl: 5   mirtazapine (REMERON) 7.5 MG tablet, Take 7.5 mg by mouth at bedtime., Disp: , Rfl:    pantoprazole (PROTONIX) 40 MG tablet, Take 1 tablet (40 mg total) by mouth 2 (two) times daily before a meal., Disp: 60 tablet,  Rfl: 1   potassium chloride (MICRO-K) 10 MEQ CR capsule, Take 1 capsule (10 mEq total) by mouth daily as needed (take potasium if you take torsemide.)., Disp: 10 capsule, Rfl: 0   predniSONE (DELTASONE) 10 MG tablet, Take 1 tablet (10 mg total) by mouth daily. Day 1-3: take 4 tablets PO daily Day 4-6: take 3 tablets PO daily Day 7-9: take 2 tablets PO daily Day 10-12: take 1 tablet PO daily, Disp: 30 tablet, Rfl: 0   sertraline (ZOLOFT) 50 MG tablet, Take by mouth., Disp: , Rfl:    Tiotropium  Bromide Monohydrate (SPIRIVA RESPIMAT) 2.5 MCG/ACT AERS, INHALE 2 PUFFS INTO THE LUNGS DAILY, Disp: 12 g, Rfl: 3   torsemide (DEMADEX) 20 MG tablet, Take 20 mg by mouth daily as needed., Disp: , Rfl:    vitamin B-12 1000 MCG tablet, Take 1 tablet (1,000 mcg total) by mouth daily., Disp: 30 tablet, Rfl: 2 No current facility-administered medications for this visit.  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 chronic bronchitis Severe respiratory insufficiency Continue Advair and Spiriva as prescribed Albuterol as needed Avoid secondhand smoke  Chronic bronchitis and emphysema Pulmonary hygiene continue flutter valve 10-15 times per day  Chronic hypoxic respiratory failure due to COPD Patient benefits from oxygen therapy 2 L nasal cannula She needs this for survival  Diastolic heart failure with pulm edema and CHF Follow-up with [facility] cardiology   Surgical preop assessment Patient is a moderate to high risk for postop and Intra-Op complications due to her Lung disease  I have discussed that there is always a increased risk Pulmonary Infection, increased chance of Respiratory Failure and Cardiac Arrest, increased chance of pneumothorax and collapsed lung, as well as increased Stroke and Death. I have explained Risks to patient At this time, Patient is at optimal medical management.   Patient has VERY HIGH  risk for postop complications Patient is optimized with her medical therapy  Preoperative Pulmonary Risk Assessment Definite risk  factors for post-operative pulmonary complications include age >54, COPD, CHF, ASA class >2, functional dependence, OSA, pulmonary HTN, baseline hypoxia, poor nutritional status, surgery >3 hours, emergency surgery and high-risk surgical sites (AAA, upper abdomen, thoracic, head/neck). - ABG is unlikely to aid in pre-op evaluation  General Risk Reduction Strategies: - All patients warrant post-operative incentive spirometry. For those with obstruction, also consider flutter valve. - Early ambulation, PT/OT - DVT prophylaxis where appropriate - Adequate pain control without oversedation   MEDICATION ADJUSTMENTS/LABS AND TESTS ORDERED: Continue inhalers as prescribed Continue oxygen as prescribed AVOID SECOND/THIRD HAND SMOKING  CURRENT MEDICATIONS REVIEWED AT LENGTH WITH PATIENT TODAY   Patient satisfied with Plan of action and management. All questions answered  Follow-up in 6 months  Total time spent 37 mins   Jerrie Schussler Patricia Pesa, M.D.  [facility]  Medical Director [place] Director [facility] Cardio-Pulmonary Department

## 2023-04-20 ENCOUNTER — Other Ambulatory Visit: Payer: Self-pay | Admitting: Internal Medicine

## 2023-04-23 ENCOUNTER — Other Ambulatory Visit: Payer: Self-pay

## 2023-04-23 MED ORDER — SPIRIVA RESPIMAT 2.5 MCG/ACT IN AERS: INHALATION_SPRAY | RESPIRATORY_TRACT | 3 refills | Status: DC

## 2023-04-30 DIAGNOSIS — E1122 Type 2 diabetes mellitus with diabetic chronic kidney disease: Secondary | ICD-10-CM | POA: Diagnosis not present

## 2023-04-30 DIAGNOSIS — I5030 Unspecified diastolic (congestive) heart failure: Secondary | ICD-10-CM | POA: Diagnosis not present

## 2023-04-30 DIAGNOSIS — J9611 Chronic respiratory failure with hypoxia: Secondary | ICD-10-CM | POA: Diagnosis not present

## 2023-04-30 DIAGNOSIS — J42 Unspecified chronic bronchitis: Secondary | ICD-10-CM | POA: Diagnosis not present

## 2023-04-30 DIAGNOSIS — I10 Essential (primary) hypertension: Secondary | ICD-10-CM | POA: Diagnosis not present

## 2023-04-30 DIAGNOSIS — E1169 Type 2 diabetes mellitus with other specified complication: Secondary | ICD-10-CM | POA: Diagnosis not present

## 2023-04-30 DIAGNOSIS — N1831 Chronic kidney disease, stage 3a: Secondary | ICD-10-CM | POA: Diagnosis not present

## 2023-04-30 DIAGNOSIS — E785 Hyperlipidemia, unspecified: Secondary | ICD-10-CM | POA: Diagnosis not present

## 2023-05-01 DIAGNOSIS — I10 Essential (primary) hypertension: Secondary | ICD-10-CM | POA: Diagnosis not present

## 2023-05-01 DIAGNOSIS — E1122 Type 2 diabetes mellitus with diabetic chronic kidney disease: Secondary | ICD-10-CM | POA: Diagnosis not present

## 2023-05-01 DIAGNOSIS — E1169 Type 2 diabetes mellitus with other specified complication: Secondary | ICD-10-CM | POA: Diagnosis not present

## 2023-05-01 DIAGNOSIS — N1831 Chronic kidney disease, stage 3a: Secondary | ICD-10-CM | POA: Diagnosis not present

## 2023-05-01 DIAGNOSIS — I5030 Unspecified diastolic (congestive) heart failure: Secondary | ICD-10-CM | POA: Diagnosis not present

## 2023-05-01 DIAGNOSIS — E785 Hyperlipidemia, unspecified: Secondary | ICD-10-CM | POA: Diagnosis not present

## 2023-06-24 ENCOUNTER — Other Ambulatory Visit: Payer: Self-pay | Admitting: Internal Medicine

## 2023-07-01 ENCOUNTER — Observation Stay: Payer: PPO

## 2023-07-01 ENCOUNTER — Inpatient Hospital Stay
Admission: EM | Admit: 2023-07-01 | Discharge: 2023-07-03 | DRG: 190 | Disposition: A | Discharge status: Home Health | Payer: PPO | Attending: Internal Medicine | Admitting: Internal Medicine

## 2023-07-01 ENCOUNTER — Other Ambulatory Visit: Payer: Self-pay

## 2023-07-01 ENCOUNTER — Emergency Department: Payer: PPO

## 2023-07-01 DIAGNOSIS — Z8261 Family history of arthritis: Secondary | ICD-10-CM

## 2023-07-01 DIAGNOSIS — R0602 Shortness of breath: Secondary | ICD-10-CM | POA: Diagnosis not present

## 2023-07-01 DIAGNOSIS — J441 Chronic obstructive pulmonary disease with (acute) exacerbation: Secondary | ICD-10-CM | POA: Diagnosis not present

## 2023-07-01 DIAGNOSIS — J189 Pneumonia, unspecified organism: Secondary | ICD-10-CM | POA: Diagnosis not present

## 2023-07-01 DIAGNOSIS — Z79899 Other long term (current) drug therapy: Secondary | ICD-10-CM

## 2023-07-01 DIAGNOSIS — Z515 Encounter for palliative care: Secondary | ICD-10-CM

## 2023-07-01 DIAGNOSIS — R0600 Dyspnea, unspecified: Secondary | ICD-10-CM | POA: Diagnosis not present

## 2023-07-01 DIAGNOSIS — E1122 Type 2 diabetes mellitus with diabetic chronic kidney disease: Secondary | ICD-10-CM | POA: Diagnosis present

## 2023-07-01 DIAGNOSIS — Z8249 Family history of ischemic heart disease and other diseases of the circulatory system: Secondary | ICD-10-CM

## 2023-07-01 DIAGNOSIS — I3139 Other pericardial effusion (noninflammatory): Secondary | ICD-10-CM | POA: Diagnosis not present

## 2023-07-01 DIAGNOSIS — R058 Other specified cough: Secondary | ICD-10-CM | POA: Diagnosis not present

## 2023-07-01 DIAGNOSIS — N183 Chronic kidney disease, stage 3 unspecified: Secondary | ICD-10-CM | POA: Diagnosis present

## 2023-07-01 DIAGNOSIS — J9611 Chronic respiratory failure with hypoxia: Secondary | ICD-10-CM | POA: Diagnosis not present

## 2023-07-01 DIAGNOSIS — R590 Localized enlarged lymph nodes: Secondary | ICD-10-CM | POA: Diagnosis not present

## 2023-07-01 DIAGNOSIS — D631 Anemia in chronic kidney disease: Secondary | ICD-10-CM | POA: Diagnosis present

## 2023-07-01 DIAGNOSIS — I509 Heart failure, unspecified: Secondary | ICD-10-CM

## 2023-07-01 DIAGNOSIS — N1832 Chronic kidney disease, stage 3b: Secondary | ICD-10-CM | POA: Diagnosis present

## 2023-07-01 DIAGNOSIS — J440 Chronic obstructive pulmonary disease with acute lower respiratory infection: Secondary | ICD-10-CM | POA: Diagnosis not present

## 2023-07-01 DIAGNOSIS — D649 Anemia, unspecified: Secondary | ICD-10-CM | POA: Diagnosis present

## 2023-07-01 DIAGNOSIS — Z1152 Encounter for screening for COVID-19: Secondary | ICD-10-CM

## 2023-07-01 DIAGNOSIS — J439 Emphysema, unspecified: Secondary | ICD-10-CM | POA: Diagnosis not present

## 2023-07-01 DIAGNOSIS — I5032 Chronic diastolic (congestive) heart failure: Secondary | ICD-10-CM | POA: Diagnosis present

## 2023-07-01 DIAGNOSIS — R918 Other nonspecific abnormal finding of lung field: Secondary | ICD-10-CM | POA: Diagnosis not present

## 2023-07-01 DIAGNOSIS — J9811 Atelectasis: Secondary | ICD-10-CM | POA: Diagnosis present

## 2023-07-01 DIAGNOSIS — J9621 Acute and chronic respiratory failure with hypoxia: Secondary | ICD-10-CM | POA: Diagnosis present

## 2023-07-01 DIAGNOSIS — J9601 Acute respiratory failure with hypoxia: Secondary | ICD-10-CM | POA: Diagnosis present

## 2023-07-01 DIAGNOSIS — Z66 Do not resuscitate: Secondary | ICD-10-CM | POA: Diagnosis present

## 2023-07-01 DIAGNOSIS — I10 Essential (primary) hypertension: Secondary | ICD-10-CM | POA: Diagnosis present

## 2023-07-01 DIAGNOSIS — J9610 Chronic respiratory failure, unspecified whether with hypoxia or hypercapnia: Secondary | ICD-10-CM | POA: Diagnosis present

## 2023-07-01 DIAGNOSIS — Z888 Allergy status to other drugs, medicaments and biological substances status: Secondary | ICD-10-CM

## 2023-07-01 DIAGNOSIS — Z681 Body mass index (BMI) 19 or less, adult: Secondary | ICD-10-CM

## 2023-07-01 DIAGNOSIS — E440 Moderate protein-calorie malnutrition: Secondary | ICD-10-CM | POA: Insufficient documentation

## 2023-07-01 DIAGNOSIS — E7800 Pure hypercholesterolemia, unspecified: Secondary | ICD-10-CM | POA: Diagnosis present

## 2023-07-01 DIAGNOSIS — I130 Hypertensive heart and chronic kidney disease with heart failure and stage 1 through stage 4 chronic kidney disease, or unspecified chronic kidney disease: Secondary | ICD-10-CM | POA: Diagnosis present

## 2023-07-01 DIAGNOSIS — Z87891 Personal history of nicotine dependence: Secondary | ICD-10-CM

## 2023-07-01 LAB — CBC WITH DIFFERENTIAL/PLATELET
Abs Immature Granulocytes: 0.08 10*3/uL — ABNORMAL HIGH (ref 0.00–0.07)
Basophils Absolute: 0 10*3/uL (ref 0.0–0.1)
Basophils Relative: 0 %
Eosinophils Absolute: 0.1 10*3/uL (ref 0.0–0.5)
Eosinophils Relative: 1 %
HCT: 30.7 % — ABNORMAL LOW (ref 36.0–46.0)
Hemoglobin: 9 g/dL — ABNORMAL LOW (ref 12.0–15.0)
Immature Granulocytes: 1 %
Lymphocytes Relative: 9 %
Lymphs Abs: 1.2 10*3/uL (ref 0.7–4.0)
MCH: 25.6 pg — ABNORMAL LOW (ref 26.0–34.0)
MCHC: 29.3 g/dL — ABNORMAL LOW (ref 30.0–36.0)
MCV: 87.2 fL (ref 80.0–100.0)
Monocytes Absolute: 1.4 10*3/uL — ABNORMAL HIGH (ref 0.1–1.0)
Monocytes Relative: 10 %
Neutro Abs: 11.2 10*3/uL — ABNORMAL HIGH (ref 1.7–7.7)
Neutrophils Relative %: 79 %
Platelets: 279 10*3/uL (ref 150–400)
RBC: 3.52 MIL/uL — ABNORMAL LOW (ref 3.87–5.11)
RDW: 15.1 % (ref 11.5–15.5)
WBC: 13.9 10*3/uL — ABNORMAL HIGH (ref 4.0–10.5)
nRBC: 0 % (ref 0.0–0.2)

## 2023-07-01 LAB — BLOOD GAS, VENOUS
Acid-Base Excess: 2.3 mmol/L — ABNORMAL HIGH (ref 0.0–2.0)
Bicarbonate: 29.5 mmol/L — ABNORMAL HIGH (ref 20.0–28.0)
O2 Saturation: 50.3 %
Patient temperature: 37
pCO2, Ven: 56 mmHg (ref 44–60)
pH, Ven: 7.33 (ref 7.25–7.43)
pO2, Ven: 33 mmHg (ref 32–45)

## 2023-07-01 LAB — BASIC METABOLIC PANEL
Anion gap: 10 (ref 5–15)
BUN: 33 mg/dL — ABNORMAL HIGH (ref 8–23)
CO2: 23 mmol/L (ref 22–32)
Calcium: 10.1 mg/dL (ref 8.9–10.3)
Chloride: 103 mmol/L (ref 98–111)
Creatinine, Ser: 1.57 mg/dL — ABNORMAL HIGH (ref 0.44–1.00)
GFR, Estimated: 32 mL/min — ABNORMAL LOW (ref >60)
Glucose, Bld: 129 mg/dL — ABNORMAL HIGH (ref 70–99)
Potassium: 5 mmol/L (ref 3.5–5.1)
Sodium: 136 mmol/L (ref 135–145)

## 2023-07-01 LAB — SARS CORONAVIRUS 2 BY RT PCR: SARS Coronavirus 2 by RT PCR: NEGATIVE

## 2023-07-01 LAB — PROCALCITONIN: Procalcitonin: 0.1 ng/mL

## 2023-07-01 LAB — LACTIC ACID, PLASMA
Lactic Acid, Venous: 1.3 mmol/L (ref 0.5–1.9)
Lactic Acid, Venous: 0.7 mmol/L (ref 0.5–1.9)

## 2023-07-01 LAB — BRAIN NATRIURETIC PEPTIDE: B Natriuretic Peptide: 313.8 pg/mL — ABNORMAL HIGH (ref 0.0–100.0)

## 2023-07-01 LAB — TROPONIN I (HIGH SENSITIVITY)
Troponin I (High Sensitivity): 13 ng/L (ref <18)
Troponin I (High Sensitivity): 12 ng/L (ref <18)

## 2023-07-01 MED ORDER — CARVEDILOL 3.125 MG PO TABS
6.2500 mg | ORAL_TABLET | Freq: Two times a day (BID) | ORAL | Status: DC
  Administered 2023-07-01 – 2023-07-03 (×4): 6.25 mg via ORAL
  Filled 2023-07-01 (×4): qty 2

## 2023-07-01 MED ORDER — DOCUSATE SODIUM 100 MG PO CAPS
100.0000 mg | ORAL_CAPSULE | ORAL | Status: DC
  Administered 2023-07-01 – 2023-07-03 (×2): 100 mg via ORAL
  Filled 2023-07-01 (×2): qty 1

## 2023-07-01 MED ORDER — IPRATROPIUM-ALBUTEROL 0.5-2.5 (3) MG/3ML IN SOLN
9.0000 mL | Freq: Once | RESPIRATORY_TRACT | Status: AC
  Administered 2023-07-01: 9 mL via RESPIRATORY_TRACT
  Filled 2023-07-01: qty 3

## 2023-07-01 MED ORDER — ENOXAPARIN SODIUM 30 MG/0.3ML IJ SOSY
30.0000 mg | PREFILLED_SYRINGE | INTRAMUSCULAR | Status: DC
  Administered 2023-07-01 – 2023-07-02 (×2): 30 mg via SUBCUTANEOUS
  Filled 2023-07-01 (×2): qty 0.3

## 2023-07-01 MED ORDER — PREDNISONE 20 MG PO TABS
40.0000 mg | ORAL_TABLET | Freq: Every day | ORAL | Status: DC
  Administered 2023-07-02 – 2023-07-03 (×2): 40 mg via ORAL
  Filled 2023-07-01 (×2): qty 2

## 2023-07-01 MED ORDER — MOMETASONE FURO-FORMOTEROL FUM 200-5 MCG/ACT IN AERO
2.0000 | INHALATION_SPRAY | Freq: Two times a day (BID) | RESPIRATORY_TRACT | Status: DC
  Administered 2023-07-02 – 2023-07-03 (×3): 2 via RESPIRATORY_TRACT
  Filled 2023-07-01: qty 8.8

## 2023-07-01 MED ORDER — ENSURE ENLIVE PO LIQD: 237.0000 mL | Freq: Two times a day (BID) | ORAL | Status: DC

## 2023-07-01 MED ORDER — SODIUM CHLORIDE 0.9 % IV SOLN
1.0000 g | INTRAVENOUS | Status: DC
  Administered 2023-07-02: 1 g via INTRAVENOUS
  Filled 2023-07-01 (×2): qty 10

## 2023-07-01 MED ORDER — SODIUM CHLORIDE 0.9 % IV SOLN
1.0000 g | Freq: Once | INTRAVENOUS | Status: AC
  Administered 2023-07-01: 1 g via INTRAVENOUS
  Filled 2023-07-01: qty 10

## 2023-07-01 MED ORDER — SODIUM CHLORIDE 0.9% FLUSH
3.0000 mL | Freq: Two times a day (BID) | INTRAVENOUS | Status: DC
  Administered 2023-07-01 – 2023-07-03 (×4): 3 mL via INTRAVENOUS

## 2023-07-01 MED ORDER — SERTRALINE HCL 50 MG PO TABS
50.0000 mg | ORAL_TABLET | Freq: Every day | ORAL | Status: DC
  Administered 2023-07-02 – 2023-07-03 (×2): 50 mg via ORAL
  Filled 2023-07-01 (×2): qty 1

## 2023-07-01 MED ORDER — SODIUM CHLORIDE 0.9 % IV SOLN
500.0000 mg | Freq: Once | INTRAVENOUS | Status: AC
  Administered 2023-07-01: 500 mg via INTRAVENOUS
  Filled 2023-07-01: qty 5

## 2023-07-01 MED ORDER — IPRATROPIUM-ALBUTEROL 0.5-2.5 (3) MG/3ML IN SOLN
3.0000 mL | Freq: Four times a day (QID) | RESPIRATORY_TRACT | Status: DC
  Administered 2023-07-01 – 2023-07-03 (×9): 3 mL via RESPIRATORY_TRACT
  Filled 2023-07-01 (×8): qty 3

## 2023-07-01 MED ORDER — AZITHROMYCIN 250 MG PO TABS
250.0000 mg | ORAL_TABLET | Freq: Every day | ORAL | Status: DC
  Administered 2023-07-02 – 2023-07-03 (×2): 250 mg via ORAL
  Filled 2023-07-01 (×2): qty 1

## 2023-07-01 NOTE — Assessment & Plan Note (Signed)
Patient presenting with shortness of breath and productive cough with evidence of a right upper lobe opacity seen on chest x-ray.  Given unusual location, concern for aspiration remains on the differential.  Will obtain a CT of the chest for further characterization.  - CT of the chest without contrast - Continue Ceftriaxone and azithromycin - Procalcitonin pending - Strep pneumo and Legionella urinary antigens

## 2023-07-01 NOTE — ED Triage Notes (Signed)
Pt comes in from home via [facility]  with complaints of shortness of breath for the past 2 days. According to EMS, the patient has also been having a productive cough the past 2 days with green sputum. Pt had an 02 Sat of 84 on 4L nasal cannula with EMS. Pt received 125 mg of solumedrol, and 1 duoneb en route, and told EMS that she was feeling a lot better afterwards. Pt has a history of COPD and Emphysema, and is chronically on 4L nasal cannula at home. Pt has no complaints of chest pain, but does complain of chronic right hip pain, that is inoperable. Patient has a 20 gauge in the right hand. Pt is alert and oriented x4, and with labored respirations at this time.

## 2023-07-01 NOTE — H&P (Addendum)
History and Physical    Patient: [name]:[id number] DOB: 01-Dec-1941 DOA: 07/01/2023 DOS: the patient was seen and examined on 07/01/2023 PCP: Lauro Regulus, MD  Patient coming from: Home  Chief Complaint:  Chief Complaint  Patient presents with   Shortness of Breath   HPI: [name] is a 82 y.o. 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   Mrs. [name] states that for the last 2 days, she has been experiencing worsening shortness of breath and productive cough.  Her sputum is either white or green in color.  She denies any fever, nausea, vomiting, diarrhea, abdominal pain, chest pain, palpitations or lower extremity swelling.    EMS was called due to symptoms and on arrival, patient was saturating at 84% on 4 L.  She was increased to 5 L and given Solu-Medrol and DuoNebs.  ED Course:  On arrival to the ED, patient was normotensive at 102/67 with heart rate of 77.  She was saturating at 95% on her home 4 L. Initial workup demonstrated WBC of 13.9, hemoglobin 9.0, creatinine 1.57 with GFR of 32, BNP of 313 and troponin of 13.  VBG with pH of 7.33 and pCO2 of 56.  COVID-19 PCR negative.  Chest x-ray was obtained that demonstrated right upper lobe opacity concerning for pneumonia.  Patient started on azithromycin, ceftriaxone, DuoNebs.  TRH contacted for admission.  Review of Systems: As mentioned in the history of present illness. All other systems reviewed and are negative.  Past Medical History:  Diagnosis Date   CHF (congestive heart failure) (HCC)    COPD (chronic obstructive pulmonary disease) (HCC)    Diabetes mellitus without complication (HCC)    Environmental allergies    High blood pressure    High cholesterol    Past Surgical History:  Procedure Laterality Date   BREAST LUMPECTOMY Bilateral 1962 (L)  2002(R)   VESICOVAGINAL FISTULA CLOSURE W/ TAH  1975   Social History:  reports that she quit smoking about 10 years ago. Her smoking use included cigarettes. She has a 67.50 pack-year smoking history. She has never used smokeless tobacco. She reports current alcohol use. She reports that she does not use drugs.  Allergies  Allergen Reactions   Mucinex D [Pseudoephedrine-Guaifenesin Er]     Rash on neck    Family History  Problem Relation Age of Onset   Heart disease Father    Rheum arthritis Mother    Cancer Mother        breast   Heart disease Brother        2 brothers   Cancer Maternal Aunt        cervical   Cancer Maternal Aunt        stomach    Prior to Admission medications   Medication Sig Start Date End Date Taking? Authorizing Provider  acetaminophen (TYLENOL) 500 MG tablet Take 500 mg by mouth every 6 (six) hours as needed. 2 in am and 2 at night    [provider]  albuterol (PROAIR HFA) 108 (90 Base) MCG/ACT inhaler Inhale 2 puffs into the lungs every 6 (six) hours as needed for wheezing or shortness of breath. 07/22/19   Merwyn Katos, MD  atorvastatin (LIPITOR) 20 MG tablet Take 20 mg by mouth daily.    [provider]  carvedilol (COREG) 3.125  MG tablet Take 2 tablets (6.25 mg total) by mouth 2 (two) times daily with a meal. 03/22/21   Regalado, Belkys A, MD  docusate sodium (COLACE) 100 MG capsule Take 1 capsule (100 mg total) by mouth every other day. 03/23/21   Regalado, Belkys A, MD  ferrous sulfate 325 (65 FE) MG tablet Take 1 tablet (325 mg total) by mouth every other day. 03/23/21   Regalado, Belkys A, MD  fluticasone-salmeterol (WIXELA INHUB) 250-50 MCG/ACT AEPB INHALE 1 PUFF INTO THE LUNGS TWICE DAILY 06/25/23   Erin Fulling, MD  mirtazapine (REMERON) 7.5 MG tablet Take 7.5 mg by mouth at bedtime. 10/24/20   [provider]  pantoprazole (PROTONIX) 40 MG tablet Take 1 tablet (40 mg total) by mouth 2 (two) times daily before a meal.  03/22/21   Regalado, Belkys A, MD  potassium chloride (MICRO-K) 10 MEQ CR capsule Take 1 capsule (10 mEq total) by mouth daily as needed (take potasium if you take torsemide.). 03/22/21   Regalado, Belkys A, MD  sertraline (ZOLOFT) 50 MG tablet Take by mouth. 01/11/21 01/11/22  [provider]  Tiotropium Bromide Monohydrate (SPIRIVA RESPIMAT) 2.5 MCG/ACT AERS INHALE 2 PUFFS INTO THE LUNGS DAILY 04/23/23   Erin Fulling, MD  torsemide (DEMADEX) 20 MG tablet Take 20 mg by mouth daily as needed.    [provider]  vitamin B-12 1000 MCG tablet Take 1 tablet (1,000 mcg total) by mouth daily. 03/23/21   Alba Cory, MD    Physical Exam: Vitals:   07/01/23 1430 07/01/23 1500 07/01/23 1550 07/01/23 1600  BP: (!) 112/55 108/70  134/62  Pulse: 79 90  92  Resp: 15 14  18   Temp:    97.7 F (36.5 C)  TempSrc:    Oral  SpO2: 99% (!) 89% 91% 98%  Weight: 50.3 kg   44.9 kg  Height:    5\' 2"  (1.575 m)   Physical Exam Vitals and nursing note reviewed.  Constitutional:      General: She is not in acute distress. HENT:     Head: Normocephalic and atraumatic.  Eyes:     Extraocular Movements: Extraocular movements intact.     Pupils: Pupils are equal, round, and reactive to light.  Cardiovascular:     Rate and Rhythm: Normal rate and regular rhythm.  Pulmonary:     Effort: No tachypnea or respiratory distress.     Breath sounds: Decreased breath sounds (Throughout, but especially in the right and upper lung field) present. No wheezing.  Abdominal:     Palpations: Abdomen is soft.     Tenderness: There is no abdominal tenderness.  Musculoskeletal:     Cervical back: Normal range of motion and neck supple.     Right lower leg: No edema.     Left lower leg: No edema.  Skin:    General: Skin is warm and dry.     Comments: Small healing hematoma on the shin of the left leg  Neurological:     General: No focal deficit present.     Mental Status: She is alert and oriented to  person, place, and time.  Psychiatric:        Mood and Affect: Mood normal.        Behavior: Behavior normal.    Data Reviewed: CBC with WBC of 13.9, hemoglobin 9.0, MCV 87 and platelets of 279 BMP with sodium of 136, potassium 5.0, bicarb 23, glucose 129, BUN 33, creatinine 1.57 with GFR 32  BNP elevated at 313 Troponin negative at 13 Lactic acid 1.3 COVID-19 PCR negative VBG with pH of 7.33 and pCO2 of 56  EKG personally reviewed.  Sinus rhythm with rate of 81.  No ST or T wave changes concerning for acute ischemia.  CT CHEST WO CONTRAST  Result Date: 07/01/2023 CLINICAL DATA:  Shortness of breath.  Abnormal x-ray EXAM: CT CHEST WITHOUT CONTRAST TECHNIQUE: Multidetector CT imaging of the chest was performed following the standard protocol without IV contrast. RADIATION DOSE REDUCTION: This exam was performed according to the departmental dose-optimization program which includes automated exposure control, adjustment of the mA and/or kV according to patient size and/or use of iterative reconstruction technique. COMPARISON:  X-ray earlier 07/01/2023 and older. Chest CT scan 2014 July FINDINGS: Cardiovascular: Heart is nonenlarged. Small pericardial effusion. Coronary artery calcifications are seen. The thoracic aorta has a normal course and caliber with scattered vascular calcifications. Enlargement of the main pulmonary arteries. Please correlate for any evidence of pulmonary hypertension. Mediastinum/Nodes: Thyroid gland is unremarkable. The esophagus has a normal course and caliber and is patulous. No abnormal lymph node enlargement identified in the axillary regions. There are several enlarged mediastinal nodes. Example to the left of the trachea on image 57 of series 2 measuring 19 x 14 mm. Pretracheal node measuring 18 x 18 mm on series 2, image 61. Several other enlarged nodes as well paratracheal, subcarinal. No left hilar nodal enlargement. Question of a right-sided enlarged node but  evaluation limited without IV contrast. Lungs/Pleura: Advanced centrilobular emphysematous lung changes. In the posterior right upper lobe in the right lower lobe is some dependent parenchymal lung opacity. Possible acute infiltrate. There is a very small right-sided pleural effusion. No pneumothorax. There is also some ill-defined opacity in the lateral right upper lobe abutting the pleura on series 3 image 46. In the right lower lobe there is significant opacification of the bronchi including the bronchus intermedius as well. Areas of collapse in the dependent middle lobe as well. Upper Abdomen: Nonobstructing upper pole right-sided renal stone. The adrenal glands are preserved. Musculoskeletal: Mild degenerative changes along the spine. IMPRESSION: Multifocal right hemithorax parenchymal opacity greatest in the right lower lobe and some in the right upper lobe. There is also significant opacification along bronchi in the right lung including the bronchus intermedius and right lower lobe bronchi. Possible debris. Recommend short follow-up to confirm resolution. Several enlarged mediastinal lymph nodes identified. Based on the lung findings these could be reactive but recommend attention on short follow-up. Severe underlying emphysematous lung changes with enlargement of the pulmonary arteries. Aortic Atherosclerosis (ICD10-I70.0) and Emphysema (ICD10-J43.9). Electronically Signed   By: Karen Kays M.D.   On: 07/01/2023 16:04   DG Chest Port 1 View  Result Date: 07/01/2023 CLINICAL DATA:  sob EXAM: PORTABLE CHEST 1 VIEW COMPARISON:  CXR 12/27/22 FINDINGS: Likely trace right pleural effusion. No pneumothorax. Redemonstrated prominent bilateral interstitial opacities with a new superimposed airspace opacity in the right upper lung. No radiographically apparent displaced rib fractures. Visualized upper abdomen is unremarkable. IMPRESSION: 1. New airspace opacity in the right upper lung, concerning for pneumonia.  2. Likely trace right pleural effusion. Electronically Signed   By: Lorenza Cambridge M.D.   On: 07/01/2023 12:42    Results are pending, will review when available.  Assessment and Plan:  * COPD with acute exacerbation (HCC) Patient presenting with 2 days of shortness of breath and productive cough in the setting of community-acquired pneumonia leading to COPD exacerbation.  - Continue  supplemental oxygen to maintain oxygen saturation above 88% - S/p Solu-Medrol 125 mg once - Start prednisone 40 mg tomorrow to complete a 5-day course - DuoNebs every 6 hours - Continue home bronchodilators  CAP (community acquired pneumonia) Patient presenting with shortness of breath and productive cough with evidence of a right upper lobe opacity seen on chest x-ray.  Given unusual location, concern for aspiration remains on the differential.  Will obtain a CT of the chest for further characterization.  - CT of the chest without contrast - Continue Ceftriaxone and azithromycin - Procalcitonin pending - Strep pneumo and Legionella urinary antigens  Chronic respiratory failure (HCC) Patient is currently on her home 4 L.  - Continue home supplemental oxygen  CHF (congestive heart failure) (HCC) Patient appears euvolemic on examination.  - Resume home regimen tomorrow  Type 2 diabetes mellitus with stage 3 chronic kidney disease (HCC) - SSI, moderate - A1c pending - Renal function currently at baseline  Anemia Stable and unchanged.  No evidence of bleeding on examination  - CBC - Transfuse for hemoglobin less than 7  HTN (hypertension), benign - Restart home regimen tomorrow  Advance Care Planning:   Code Status: Full Code patient states that she would be okay with a very short resuscitative effort, however if there is no long-term recovery possible, she would not want to be on long-term life support.  Patient's daughter-in-law at bedside for discussion.  Consults: None   Family  Communication: Patient's daughter-in-law updated at bedside  Severity of Illness: The appropriate patient status for this patient is OBSERVATION. Observation status is judged to be reasonable and necessary in order to provide the required intensity of service to ensure the patient's safety. The patient's presenting symptoms, physical exam findings, and initial radiographic and laboratory data in the context of their medical condition is felt to place them at decreased risk for further clinical deterioration. Furthermore, it is anticipated that the patient will be medically stable for discharge from the hospital within 2 midnights of admission.   Author: Verdene Lennert, MD 07/01/2023 4:53 PM  For on call review [email or web address].

## 2023-07-01 NOTE — Assessment & Plan Note (Signed)
Patient presenting with 2 days of shortness of breath and productive cough in the setting of community-acquired pneumonia leading to COPD exacerbation.  - Continue supplemental oxygen to maintain oxygen saturation above 88% - S/p Solu-Medrol 125 mg once - Start prednisone 40 mg tomorrow to complete a 5-day course - DuoNebs every 6 hours - Continue home bronchodilators

## 2023-07-01 NOTE — Assessment & Plan Note (Addendum)
Patient appears euvolemic on examination.  - Resume home regimen tomorrow

## 2023-07-01 NOTE — Plan of Care (Signed)

## 2023-07-01 NOTE — Assessment & Plan Note (Signed)
-   Restart home regimen tomorrow

## 2023-07-01 NOTE — Assessment & Plan Note (Signed)
-   SSI, moderate - A1c pending - Renal function currently at baseline

## 2023-07-01 NOTE — Assessment & Plan Note (Signed)
Stable and unchanged.  No evidence of bleeding on examination  - CBC - Transfuse for hemoglobin less than 7

## 2023-07-01 NOTE — ED Notes (Signed)
Patient given coffee per Md Modesto Charon

## 2023-07-01 NOTE — Assessment & Plan Note (Signed)
Patient is currently on her home 4 L.  - Continue home supplemental oxygen

## 2023-07-01 NOTE — ED Provider Notes (Signed)
[facility] Provider Note    Event Date/Time   First MD Initiated Contact with Patient 07/01/23 1139     (approximate)   History   Shortness of Breath   HPI  [name] is a 82 y.o. female   Past medical history of COPD on 4 L baseline, CHF, diabetes, hypertension, hyperlipidemia presents emergency department with 2 days of productive cough, shortness of breath on exertion, wheezing.  She denies chest pain, denies fever.  Denies peripheral edema.  No known sick contacts.  EMS gave Solu-Medrol and single DuoNeb en route.  She feels better with that treatment.  They report that she was in the mid 80% on her home 4 L O2 so increased her oxygen.  Independent Historian contributed to assessment above: EMS as above      Physical Exam   Triage Vital Signs: ED Triage Vitals  Enc Vitals Group     BP --      Pulse --      Resp --      Temp --      Temp src --      SpO2 07/01/23 1140 (!) 84 %     Weight --      Height --      Head Circumference --      Peak Flow --      Pain Score 07/01/23 1143 0     Pain Loc --      Pain Edu? --      Excl. in GC? --     Most recent vital signs: Vitals:   07/01/23 1224 07/01/23 1225  BP:    Pulse: 74 75  Resp: 14 12  SpO2: 99% 100%    General: Awake, no distress.  CV:  Good peripheral perfusion.  Resp:  Normal effort.  Abd:  No distention.  Other:  Mildly increased work of breathing.  Speaking in short phrases.  Oxygen saturation 84% on 4 L.  Lungs with some decreased breath sounds scant wheezing at bases bilaterally.  No focality.  Productive sounding cough occasionally.   ED Results / Procedures / Treatments   Labs (all labs ordered are listed, but only abnormal results are displayed) Labs Reviewed  CBC WITH DIFFERENTIAL/PLATELET - Abnormal; Notable for the following components:      Result Value   WBC 13.9 (*)    RBC 3.52 (*)    Hemoglobin 9.0 (*)    HCT 30.7 (*)    MCH 25.6 (*)     MCHC 29.3 (*)    Neutro Abs 11.2 (*)    Monocytes Absolute 1.4 (*)    Abs Immature Granulocytes 0.08 (*)    All other components within normal limits  BASIC METABOLIC PANEL - Abnormal; Notable for the following components:   Glucose, Bld 129 (*)    BUN 33 (*)    Creatinine, Ser 1.57 (*)    GFR, Estimated 32 (*)    All other components within normal limits  BRAIN NATRIURETIC PEPTIDE - Abnormal; Notable for the following components:   B Natriuretic Peptide 313.8 (*)    All other components within normal limits  BLOOD GAS, VENOUS - Abnormal; Notable for the following components:   Bicarbonate 29.5 (*)    Acid-Base Excess 2.3 (*)    All other components within normal limits  SARS CORONAVIRUS 2 BY RT PCR  LACTIC ACID, PLASMA  LACTIC ACID, PLASMA  TROPONIN I (HIGH SENSITIVITY)  TROPONIN I (HIGH SENSITIVITY)  I ordered and reviewed the above labs they are notable for leukocytosis, normal pH  EKG  ED ECG REPORT I, Pilar Jarvis, the attending physician, personally viewed and interpreted this ECG.   Date: 07/01/2023  EKG Time: 1147  Rate: 81  Rhythm: sinus  Axis: nl  Intervals:none  ST&T Change: no stemi    RADIOLOGY I independently reviewed and interpreted chest x-ray and see a right middle lobe opacity concerning for pneumonia   PROCEDURES:  Critical Care performed: Yes, see critical care procedure note(s)  .Critical Care  Performed by: Pilar Jarvis, MD Authorized by: Pilar Jarvis, MD   Critical care provider statement:    Critical care time (minutes):  30   Critical care was necessary to treat or prevent imminent or life-threatening deterioration of the following conditions:  Respiratory failure   Critical care was time spent personally by me on the following activities:  Development of treatment plan with patient or surrogate, discussions with consultants, evaluation of patient's response to treatment, examination of patient, ordering and review of laboratory  studies, ordering and review of radiographic studies, ordering and performing treatments and interventions, pulse oximetry, re-evaluation of patient's condition and review of old charts    MEDICATIONS ORDERED IN ED: Medications  azithromycin (ZITHROMAX) 500 mg in sodium chloride 0.9 % 250 mL IVPB (has no administration in time range)  cefTRIAXone (ROCEPHIN) 1 g in sodium chloride 0.9 % 100 mL IVPB (has no administration in time range)  ipratropium-albuterol (DUONEB) 0.5-2.5 (3) MG/3ML nebulizer solution 9 mL (9 mLs Nebulization Given 07/01/23 1220)    External physician / consultants:  I spoke with hospitalist for admission and regarding care plan for this patient.   IMPRESSION / MDM / ASSESSMENT AND PLAN / ED COURSE  I reviewed the triage vital signs and the nursing notes.                                Patient's presentation is most consistent with acute presentation with potential threat to life or bodily function.  Differential diagnosis includes, but is not limited to, COPD exacerbation, acute hypoxemic respiratory failure, respiratory infection, bacterial pneumonia, sepsis, heart failure exacerbation   The patient is on the cardiac monitor to evaluate for evidence of arrhythmia and/or significant heart rate changes.  MDM: Patient's clinical signs and symptoms most consistent with COPD exacerbation and respiratory infection with productive cough and wheezing on auscultation responsive to DuoNebs given en route by EMS.  Considered but doubt PE, ACS, dissection given no chest pain.  Check chest x-ray for bacterial pneumonia, check COVID swab.  Given her age, increased in oxygen requirement, and increased work of breathing I think she should be admitted.  Leukocytosis with a right upper lung opacity noted on chest x-ray, CAP coverage given, COPD medications given including DuoNebs, Solu-Medrol given by EMS, VBG within normal limits, admission.       FINAL CLINICAL IMPRESSION(S) /  ED DIAGNOSES   Final diagnoses:  COPD exacerbation (HCC)  Acute hypoxemic respiratory failure (HCC)  Productive cough     Rx / DC Orders   ED Discharge Orders     None        Note:  This document was prepared using Dragon voice recognition software and may include unintentional dictation errors.    Pilar Jarvis, MD 07/01/23 1320

## 2023-07-02 DIAGNOSIS — Z1152 Encounter for screening for COVID-19: Secondary | ICD-10-CM | POA: Diagnosis not present

## 2023-07-02 DIAGNOSIS — E1122 Type 2 diabetes mellitus with diabetic chronic kidney disease: Secondary | ICD-10-CM | POA: Diagnosis not present

## 2023-07-02 DIAGNOSIS — J189 Pneumonia, unspecified organism: Secondary | ICD-10-CM | POA: Diagnosis not present

## 2023-07-02 DIAGNOSIS — J9601 Acute respiratory failure with hypoxia: Secondary | ICD-10-CM | POA: Diagnosis not present

## 2023-07-02 DIAGNOSIS — J440 Chronic obstructive pulmonary disease with acute lower respiratory infection: Secondary | ICD-10-CM | POA: Diagnosis not present

## 2023-07-02 DIAGNOSIS — J9611 Chronic respiratory failure with hypoxia: Secondary | ICD-10-CM | POA: Diagnosis not present

## 2023-07-02 DIAGNOSIS — Z515 Encounter for palliative care: Secondary | ICD-10-CM | POA: Diagnosis not present

## 2023-07-02 DIAGNOSIS — Z8249 Family history of ischemic heart disease and other diseases of the circulatory system: Secondary | ICD-10-CM | POA: Diagnosis not present

## 2023-07-02 DIAGNOSIS — Z7189 Other specified counseling: Secondary | ICD-10-CM | POA: Diagnosis not present

## 2023-07-02 DIAGNOSIS — Z888 Allergy status to other drugs, medicaments and biological substances status: Secondary | ICD-10-CM | POA: Diagnosis not present

## 2023-07-02 DIAGNOSIS — I5032 Chronic diastolic (congestive) heart failure: Secondary | ICD-10-CM | POA: Diagnosis not present

## 2023-07-02 DIAGNOSIS — R058 Other specified cough: Secondary | ICD-10-CM | POA: Diagnosis present

## 2023-07-02 DIAGNOSIS — I130 Hypertensive heart and chronic kidney disease with heart failure and stage 1 through stage 4 chronic kidney disease, or unspecified chronic kidney disease: Secondary | ICD-10-CM | POA: Diagnosis not present

## 2023-07-02 DIAGNOSIS — N1831 Chronic kidney disease, stage 3a: Secondary | ICD-10-CM | POA: Diagnosis not present

## 2023-07-02 DIAGNOSIS — J439 Emphysema, unspecified: Secondary | ICD-10-CM | POA: Diagnosis not present

## 2023-07-02 DIAGNOSIS — Z87891 Personal history of nicotine dependence: Secondary | ICD-10-CM | POA: Diagnosis not present

## 2023-07-02 DIAGNOSIS — Z8261 Family history of arthritis: Secondary | ICD-10-CM | POA: Diagnosis not present

## 2023-07-02 DIAGNOSIS — D631 Anemia in chronic kidney disease: Secondary | ICD-10-CM | POA: Diagnosis not present

## 2023-07-02 DIAGNOSIS — Z66 Do not resuscitate: Secondary | ICD-10-CM | POA: Diagnosis not present

## 2023-07-02 DIAGNOSIS — Z681 Body mass index (BMI) 19 or less, adult: Secondary | ICD-10-CM | POA: Diagnosis not present

## 2023-07-02 DIAGNOSIS — E7800 Pure hypercholesterolemia, unspecified: Secondary | ICD-10-CM | POA: Diagnosis not present

## 2023-07-02 DIAGNOSIS — N1832 Chronic kidney disease, stage 3b: Secondary | ICD-10-CM | POA: Diagnosis not present

## 2023-07-02 DIAGNOSIS — E440 Moderate protein-calorie malnutrition: Secondary | ICD-10-CM | POA: Diagnosis not present

## 2023-07-02 DIAGNOSIS — J9811 Atelectasis: Secondary | ICD-10-CM | POA: Diagnosis not present

## 2023-07-02 DIAGNOSIS — Z79899 Other long term (current) drug therapy: Secondary | ICD-10-CM | POA: Diagnosis not present

## 2023-07-02 DIAGNOSIS — J441 Chronic obstructive pulmonary disease with (acute) exacerbation: Secondary | ICD-10-CM | POA: Diagnosis not present

## 2023-07-02 DIAGNOSIS — J9621 Acute and chronic respiratory failure with hypoxia: Secondary | ICD-10-CM | POA: Diagnosis not present

## 2023-07-02 LAB — GLUCOSE, CAPILLARY: Glucose-Capillary: 135 mg/dL — ABNORMAL HIGH (ref 70–99)

## 2023-07-02 LAB — CBC WITH DIFFERENTIAL/PLATELET
Abs Immature Granulocytes: 0.09 10*3/uL — ABNORMAL HIGH (ref 0.00–0.07)
Basophils Absolute: 0 10*3/uL (ref 0.0–0.1)
Basophils Relative: 0 %
Eosinophils Absolute: 0 10*3/uL (ref 0.0–0.5)
Eosinophils Relative: 0 %
HCT: 26.8 % — ABNORMAL LOW (ref 36.0–46.0)
Hemoglobin: 8 g/dL — ABNORMAL LOW (ref 12.0–15.0)
Immature Granulocytes: 1 %
Lymphocytes Relative: 6 %
Lymphs Abs: 0.5 10*3/uL — ABNORMAL LOW (ref 0.7–4.0)
MCH: 25.8 pg — ABNORMAL LOW (ref 26.0–34.0)
MCHC: 29.9 g/dL — ABNORMAL LOW (ref 30.0–36.0)
MCV: 86.5 fL (ref 80.0–100.0)
Monocytes Absolute: 0.4 10*3/uL (ref 0.1–1.0)
Monocytes Relative: 5 %
Neutro Abs: 8.2 10*3/uL — ABNORMAL HIGH (ref 1.7–7.7)
Neutrophils Relative %: 88 %
Platelets: 263 10*3/uL (ref 150–400)
RBC: 3.1 MIL/uL — ABNORMAL LOW (ref 3.87–5.11)
RDW: 15 % (ref 11.5–15.5)
WBC: 9.3 10*3/uL (ref 4.0–10.5)
nRBC: 0 % (ref 0.0–0.2)

## 2023-07-02 LAB — BASIC METABOLIC PANEL
Anion gap: 4 — ABNORMAL LOW (ref 5–15)
BUN: 39 mg/dL — ABNORMAL HIGH (ref 8–23)
CO2: 26 mmol/L (ref 22–32)
Calcium: 9.9 mg/dL (ref 8.9–10.3)
Chloride: 105 mmol/L (ref 98–111)
Creatinine, Ser: 1.63 mg/dL — ABNORMAL HIGH (ref 0.44–1.00)
GFR, Estimated: 31 mL/min — ABNORMAL LOW (ref >60)
Glucose, Bld: 160 mg/dL — ABNORMAL HIGH (ref 70–99)
Potassium: 5.3 mmol/L — ABNORMAL HIGH (ref 3.5–5.1)
Sodium: 135 mmol/L (ref 135–145)

## 2023-07-02 LAB — RESPIRATORY PANEL BY PCR

## 2023-07-02 LAB — HIV ANTIBODY (ROUTINE TESTING W REFLEX): HIV Screen 4th Generation wRfx: NONREACTIVE

## 2023-07-02 LAB — HEMOGLOBIN A1C
Hgb A1c MFr Bld: 6 % — ABNORMAL HIGH (ref 4.8–5.6)
Mean Plasma Glucose: 126 mg/dL

## 2023-07-02 LAB — STREP PNEUMONIAE URINARY ANTIGEN: Strep Pneumo Urinary Antigen: NEGATIVE

## 2023-07-02 MED ORDER — FUROSEMIDE 10 MG/ML IJ SOLN
40.0000 mg | Freq: Once | INTRAMUSCULAR | Status: AC
  Administered 2023-07-02: 40 mg via INTRAVENOUS
  Filled 2023-07-02: qty 4

## 2023-07-02 MED ORDER — ADULT MULTIVITAMIN W/MINERALS CH
1.0000 | ORAL_TABLET | Freq: Every day | ORAL | Status: DC
  Administered 2023-07-02 – 2023-07-03 (×2): 1 via ORAL
  Filled 2023-07-02 (×2): qty 1

## 2023-07-02 NOTE — Significant Event (Signed)
Rapid Response Event Note   Reason for Call :   Decreased oxygen saturation Initial Focused Assessment:   Bedside RN stated that patient's oxygen saturation was in the 60's.  Then realized that the oxygen tubing was kinked.  Patient's mentation never changed per RN- patient alert and oriented and talkative.     Interventions:  RN un-kinked oygen tubing and increased oxygen from baseline of 4 liters to 10 liters- patient's oxygen increased to normal levels.    Plan of Care:  Dr. Sherryll Burger at bedside.  No new orders.   Event Summary:   MD Notified: Dr. Sherryll Burger Call Time: Arrival Time: 09:26 End Time: 09:32  Vincente Poli, RN

## 2023-07-02 NOTE — Progress Notes (Signed)
Responded to rapid response page. Was told pt O2 tubing had been caught in bed and O2 sats had dropped. On arrival, pt RN, AD, ICU CN, RT, present. Pt O2 sats had come up.

## 2023-07-02 NOTE — Evaluation (Signed)
Physical Therapy Evaluation Patient Details Name: [name] MRN: [id number] DOB: 04-16-1941 Today's Date: 07/02/2023  History of Present Illness  Pt is an 82 y/o F admitted on 07/01/23 after presenting with c/o SOB & cough x 2 days. 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 Prior to admission pt ambulated household distances with RW. On this date, pt requires min<>mod assist for STS as pt with poor eccentric control with stand>sit. Pt is able to ambulate to door & back with RW & min assist with steady gait overall. Pt is limited by slight SOB after gait. Recommend ongoing PT treatment to address endurance, balance, gait.        Assistance Recommended at Discharge Intermittent Supervision/Assistance  If plan is discharge home, recommend the following:  Can travel by private vehicle  A little help with walking and/or transfers;Assistance with cooking/housework;A little help with bathing/dressing/bathroom;Assist for transportation;Help with stairs or ramp for entrance        Equipment Recommendations None recommended by PT  Recommendations for Other Services       Functional Status Assessment Patient has had a recent decline in their functional status and demonstrates the ability to make significant improvements in function in a reasonable and predictable amount of time.     Precautions / Restrictions Precautions Precautions: Fall Restrictions Weight Bearing Restrictions: No      Mobility  Bed Mobility               General bed mobility comments: not tested, pt received & left sitting in recliner    Transfers Overall transfer level: Needs assistance Equipment used: Rolling walker (2 wheels) Transfers: Sit to/from Stand Sit to Stand: Min assist            General transfer comment: STS from recliner, min assist for sit>stand, mod assist stand>sit 2/2 poor eccentric control    Ambulation/Gait Ambulation/Gait assistance: Min assist Gait Distance (Feet): 30 Feet Assistive device: Rolling walker (2 wheels) Gait Pattern/deviations: Decreased step length - right, Decreased step length - left, Decreased stride length Gait velocity: decreased        Stairs            Wheelchair Mobility     Tilt Bed    Modified Rankin (Stroke Patients Only)       Balance Overall balance assessment: Needs assistance Sitting-balance support: No upper extremity supported, Feet supported Sitting balance-Leahy Scale: Fair     Standing balance support: Bilateral upper extremity supported, Reliant on assistive device for balance, During functional activity Standing balance-Leahy Scale: Fair                               Pertinent Vitals/Pain Pain Assessment Pain Assessment: No/denies pain    Home Living Family/patient expects to be discharged to:: Private residence Living Arrangements: Children Available Help at Discharge: Family Type of Home: Mobile home Home Access:  (pt currently has steps to enter the home but family planning to move ramp so pt will have ramp access upon d/c) Entrance Stairs-Rails: None Entrance Stairs-Number of Steps: 1   Home Layout: One level Home Equipment: Agricultural consultant (2 wheels);Cane - quad;Wheelchair - manual;BSC/3in1 Additional Comments: On 4L O2 at baseline, household ambulator  Prior Function Prior Level of Function : Needs assist       Physical Assist : Mobility (physical);ADLs (physical)   ADLs (physical): IADLs Mobility Comments: Assistance for stair negotiation, household ambulator with RW. ADLs Comments: Pt reports recruiting family for assistance secondary to onsets of fatigue.     Hand Dominance   Dominant Hand: Right    Extremity/Trunk Assessment   Upper Extremity  Assessment Upper Extremity Assessment: Generalized weakness    Lower Extremity Assessment Lower Extremity Assessment: Generalized weakness    Cervical / Trunk Assessment Cervical / Trunk Assessment: Kyphotic  Communication   Communication: No difficulties  Cognition Arousal/Alertness: Awake/alert Behavior During Therapy: WFL for tasks assessed/performed Overall Cognitive Status: Within Functional Limits for tasks assessed                                 General Comments: Pleasant lady        General Comments General comments (skin integrity, edema, etc.): Pt on 4L/min via nasal cannula with lowest SPO2 of 89%, pt slightly SOB after gait with PT educating pt on pursed lip breathing    Exercises     Assessment/Plan    PT Assessment Patient needs continued PT services  PT Problem List Cardiopulmonary status limiting activity;Decreased strength;Decreased activity tolerance;Decreased balance;Decreased mobility       PT Treatment Interventions Gait training;DME instruction;Therapeutic exercise;Balance training;Stair training;Neuromuscular re-education;Functional mobility training;Therapeutic activities;Patient/family education;Modalities    PT Goals (Current goals can be found in the Care Plan section)  Acute Rehab PT Goals Patient Stated Goal: get better PT Goal Formulation: With patient/family Time For Goal Achievement: 07/16/23 Potential to Achieve Goals: Good    Frequency Min 3X/week     Co-evaluation               AM-PAC PT "6 Clicks" Mobility  Outcome Measure Help needed turning from your back to your side while in a flat bed without using bedrails?: None Help needed moving from lying on your back to sitting on the side of a flat bed without using bedrails?: A Little Help needed moving to and from a bed to a chair (including a wheelchair)?: A Little Help needed standing up from a chair using your arms (e.g., wheelchair or bedside chair)?: A  Little Help needed to walk in hospital room?: A Little Help needed climbing 3-5 steps with a railing? : A Little 6 Click Score: 19    End of Session Equipment Utilized During Treatment: Oxygen Activity Tolerance: Patient tolerated treatment well Patient left: in chair;with chair alarm set;with call bell/phone within reach;with family/visitor present Nurse Communication: Mobility status PT Visit Diagnosis: Muscle weakness (generalized) (M62.81);Unsteadiness on feet (R26.81)    Time: 1308-6578 PT Time Calculation (min) (ACUTE ONLY): 18 min   Charges:   PT Evaluation $PT Eval Low Complexity: 1 Low   PT General Charges $$ ACUTE PT VISIT: 1 Visit         Aleda Grana, PT, DPT 07/02/23, 4:29 PM   Sandi Mariscal 07/02/2023, 4:28 PM

## 2023-07-02 NOTE — Consult Note (Signed)
Consultation Note Date: 07/02/2023   Patient Name: [name]  DOB: 07/19/1941  MRN: [id number]  Age / Sex: 82 y.o., female  PCP: Lauro Regulus, MD Referring Physician: Delfino Lovett, MD  Reason for Consultation: Establishing goals of care  HPI/Patient Profile: [name] is a 82 y.o. 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 she has been experiencing worsening shortness of breath and productive cough.  Her sputum is either white or green in color. EMS was called due to symptoms and on arrival, patient was saturating at 84% on 4 L.  She was increased to 5 L and given Solu-Medrol and DuoNebs.  Patient was admitted.  Clinical Assessment and Goals of Care: Notes and labs reviewed.  In to see patient.  Her daughter-in-law is at bedside.  Patient states she lives at home with her son, daughter-in-law, 2 grandchildren, and great-grandchildren totaling 9 people.  She states at baseline she is on 4 L of oxygen and has been for about the past 2 to 3 years.  She states that she takes naps during the day to regain energy.  She states she needs assistance around the house and with some ADLs due to shortness of breath.  We discussed her diagnosis, prognosis, GOC, EOL wishes disposition and options.  Created space and opportunity for patient  to explore thoughts and feelings regarding current medical information.   A detailed discussion was had today regarding advanced directives.  Concepts specific to code status, artifical feeding and hydration, IV antibiotics and rehospitalization were discussed.  The difference between an aggressive medical intervention path and a comfort care path was discussed.  Values and goals of care important to patient and family were attempted to be  elicited.  Discussed limitations of medical interventions to prolong quality of life in some situations and discussed the concept of human mortality.  Patient states she will need to consider her wishes and decisions and speak with her son.  She states she will speak with him this evening and we can talk more tomorrow.     SUMMARY OF RECOMMENDATIONS   Will meet with patient again tomorrow afternoon.      Primary Diagnoses: Present on Admission:  HTN (hypertension), benign  Type 2 diabetes mellitus with stage 3 chronic kidney disease (HCC)  Anemia  Chronic respiratory failure (HCC)  Acute hypoxic respiratory failure (HCC)   I have reviewed the medical record, interviewed the patient and family, and examined the patient. The following aspects are pertinent.  Past Medical History:  Diagnosis Date   CHF (congestive heart failure) (HCC)    COPD (chronic obstructive pulmonary disease) (HCC)    Diabetes mellitus without complication (HCC)    Environmental allergies    High blood pressure    High cholesterol    Social History   Socioeconomic History   Marital status: Widowed    Spouse name: Not on file  Number of children: Not on file   Years of education: Not on file   Highest education level: Not on file  Occupational History   Not on file  Tobacco Use   Smoking status: Former    Packs/day: 1.50    Years: 45.00    Additional pack years: 0.00    Total pack years: 67.50    Types: Cigarettes    Quit date: 07/03/2013    Years since quitting: 10.0   Smokeless tobacco: Never  Vaping Use   Vaping Use: Never used  Substance and Sexual Activity   Alcohol use: Yes    Alcohol/week: 0.0 standard drinks of alcohol    Comment: occasional glass of wine   Drug use: No   Sexual activity: Not on file  Other Topics Concern   Not on file  Social History Narrative   Not on file   Social Determinants of Health   Financial Resource Strain: Not on file  Food Insecurity: No Food  Insecurity (07/01/2023)   Hunger Vital Sign    Worried About Running Out of Food in the Last Year: Never true    Ran Out of Food in the Last Year: Never true  Transportation Needs: No Transportation Needs (07/01/2023)   PRAPARE - Administrator, Civil Service (Medical): No    Lack of Transportation (Non-Medical): No  Physical Activity: Not on file  Stress: Not on file  Social Connections: Not on file   Family History  Problem Relation Age of Onset   Heart disease Father    Rheum arthritis Mother    Cancer Mother        breast   Heart disease Brother        2 brothers   Cancer Maternal Aunt        cervical   Cancer Maternal Aunt        stomach   Scheduled Meds:  azithromycin  250 mg Oral Daily   carvedilol  6.25 mg Oral BID WC   docusate sodium  100 mg Oral QODAY   enoxaparin (LOVENOX) injection  30 mg Subcutaneous Q24H   feeding supplement  237 mL Oral BID BM   furosemide  40 mg Intravenous Once   ipratropium-albuterol  3 mL Nebulization Q6H   mometasone-formoterol  2 puff Inhalation BID   multivitamin with minerals  1 tablet Oral Daily   predniSONE  40 mg Oral Q breakfast   sertraline  50 mg Oral Daily   sodium chloride flush  3 mL Intravenous Q12H   Continuous Infusions:  cefTRIAXone (ROCEPHIN)  IV 1 g (07/02/23 1321)   PRN Meds:. Medications Prior to Admission:  Prior to Admission medications   Medication Sig Start Date End Date Taking? Authorizing Provider  acetaminophen (TYLENOL) 500 MG tablet Take 500 mg by mouth every 6 (six) hours as needed. 2 in am and 2 at night   Yes [provider]  albuterol (PROAIR HFA) 108 (90 Base) MCG/ACT inhaler Inhale 2 puffs into the lungs every 6 (six) hours as needed for wheezing or shortness of breath. 07/22/19  Yes Merwyn Katos, MD  carvedilol (COREG) 3.125 MG tablet Take 2 tablets (6.25 mg total) by mouth 2 (two) times daily with a meal. 03/22/21  Yes Regalado, Belkys A, MD  docusate sodium (COLACE) 100 MG  capsule Take 1 capsule (100 mg total) by mouth every other day. 03/23/21  Yes Regalado, Belkys A, MD  ferrous sulfate 325 (65 FE) MG tablet Take 1  tablet (325 mg total) by mouth every other day. 03/23/21  Yes Regalado, Belkys A, MD  fluticasone-salmeterol (WIXELA INHUB) 250-50 MCG/ACT AEPB INHALE 1 PUFF INTO THE LUNGS TWICE DAILY 06/25/23  Yes Kasa, Wallis Bamberg, MD  potassium chloride (MICRO-K) 10 MEQ CR capsule Take 1 capsule (10 mEq total) by mouth daily as needed (take potasium if you take torsemide.). 03/22/21  Yes Regalado, Belkys A, MD  sertraline (ZOLOFT) 50 MG tablet Take by mouth. 01/11/21 07/01/23 Yes [provider]  Tiotropium Bromide Monohydrate (SPIRIVA RESPIMAT) 2.5 MCG/ACT AERS INHALE 2 PUFFS INTO THE LUNGS DAILY 04/23/23  Yes Erin Fulling, MD  vitamin B-12 1000 MCG tablet Take 1 tablet (1,000 mcg total) by mouth daily. 03/23/21  Yes Regalado, Belkys A, MD  atorvastatin (LIPITOR) 20 MG tablet Take 20 mg by mouth daily. Patient not taking: Reported on 07/01/2023    [provider]  mirtazapine (REMERON) 7.5 MG tablet Take 7.5 mg by mouth at bedtime. Patient not taking: Reported on 07/01/2023 10/24/20   [provider]  pantoprazole (PROTONIX) 40 MG tablet Take 1 tablet (40 mg total) by mouth 2 (two) times daily before a meal. Patient not taking: Reported on 07/01/2023 03/22/21   Regalado, Jon Billings A, MD  torsemide (DEMADEX) 20 MG tablet Take 20 mg by mouth daily as needed. Patient not taking: Reported on 07/01/2023    [provider]   Allergies  Allergen Reactions   Mucinex D [Pseudoephedrine-Guaifenesin Er]     Rash on neck   Review of Systems  All other systems reviewed and are negative.   Physical Exam Pulmonary:     Effort: Pulmonary effort is normal.  Neurological:     Mental Status: She is alert.     Vital Signs: BP (!) 111/52   Pulse 70   Temp 98.7 F (37.1 C)   Resp 14   Ht 5\' 2"  (1.575 m)   Wt 44.9 kg   SpO2 100%   BMI 18.10 kg/m  Pain  Scale: 0-10   Pain Score: 0-No pain   SpO2: SpO2: 100 % O2 Device:SpO2: 100 % O2 Flow Rate: .O2 Flow Rate (L/min): 4 L/min  IO: Intake/output summary:  Intake/Output Summary (Last 24 hours) at 07/02/2023 1633 Last data filed at 07/02/2023 1428 Gross per 24 hour  Intake 960 ml  Output 0 ml  Net 960 ml    LBM: Last BM Date : 06/30/23 Baseline Weight: Weight: 50.3 kg Most recent weight: Weight: 44.9 kg       Signed by: Morton Stall, NP   Please contact Palliative Medicine Team phone at [phone] for questions and concerns.  For individual provider: See Loretha Stapler

## 2023-07-02 NOTE — Progress Notes (Signed)
   07/02/23 0925  Spiritual Encounters  Type of Visit Initial  Care provided to: Patient  Referral source Code page  Reason for visit Code  OnCall Visit Yes   Chaplain responded to Rapid Response Code. Patient receiving care from Care team. No family present.

## 2023-07-02 NOTE — Progress Notes (Signed)
PROGRESS NOTE    [name]  QIH:[id number] DOB: 07-Jan-1941 DOA: 07/01/2023 PCP: Lauro Regulus, MD (Confirm with patient/family/NH records and if not entered, this HAS to be entered at [facility] point of entry. "No PCP" if truly none.)   Brief Narrative: (Start on day 1 of progress note - keep it brief and live) [name] is a 82 y.o. 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 she has been experiencing worsening shortness of breath and productive cough.  Her sputum is either white or green in color. EMS was called due to symptoms and on arrival, patient was saturating at 84% on 4 L.  She was increased to 5 L and given Solu-Medrol and DuoNebs.   ED Course:  On arrival to the ED, patient was normotensive at 102/67 with heart rate of 77.  She was saturating at 95% on her home 4 L. Initial workup demonstrated WBC of 13.9, hemoglobin 9.0, creatinine 1.57 with GFR of 32, BNP of 313 and troponin of 13.  VBG with pH of 7.33 and pCO2 of 56.  COVID-19 PCR negative.  Chest x-ray was obtained that demonstrated right upper lobe opacity concerning for pneumonia.  Patient started on azithromycin, ceftriaxone, DuoNebs.  TRH contacted for admission.  7/2: Pulmonary and palliative care consult  Assessment & Plan:   Principal Problem:   COPD with acute exacerbation (HCC) Active Problems:   CAP (community acquired pneumonia)   Chronic respiratory failure (HCC)   CHF (congestive heart failure) (HCC)   Type 2 diabetes mellitus with stage 3 chronic kidney disease (HCC)   Anemia   HTN (hypertension), benign   Acute hypoxic respiratory failure (HCC)   * COPD with acute exacerbation (HCC) Patient presenting with 2 days of shortness of breath and productive cough in the setting of community-acquired pneumonia leading to COPD exacerbation.   - Continue supplemental  oxygen to maintain oxygen saturation above 88%.  Currently on 5 L oxygen via nasal cannula - Continue prednisone 40 mg daily to complete a 5-day course - DuoNebs every 6 hours - Continue home bronchodilators -Pulmonary consult   CAP (community acquired pneumonia) Patient presenting with shortness of breath and productive cough with evidence of a right upper lobe opacity seen on chest x-ray.  Given unusual location, concern for aspiration remains on the differential.  Will obtain a CT of the chest for further characterization.   - CT of the chest without contrast showed Multifocal right hemithorax parenchymal opacity greatest in the right lower lobe and some in the right upper lobe. There is also significant opacification along bronchi in the right lung including the bronchus intermedius and right lower lobe bronchi. Possible debris. Several enlarged mediastinal lymph nodes identified. Severe underlying emphysematous lung changes with enlargement of the pulmonary arteries. - Continue Ceftriaxone and azithromycin - Procalcitonin 0.1 - Strep pneumo negative and Legionella urinary antigens pending, respiratory panel negative   Chronic respiratory failure (HCC) Patient is currently on her home 4 L.  At times requires 5 L - Continue supplemental oxygen.   CHF (congestive heart failure) (HCC) Hyperkalemia Patient appears euvolemic on examination. -Will give 40 mg IV Lasix as patient had hyperkalemia with a potassium of 5.3   Type 2 diabetes mellitus with stage 3 A chronic kidney disease (HCC) - SSI, moderate - A1c pending - Renal function slowly worsening Lab Results  Component Value Date  CREATININE 1.63 (H) 07/02/2023   CREATININE 1.57 (H) 07/01/2023   CREATININE 1.40 (H) 12/27/2022     Anemia of chronic kidney disease Stable and unchanged.  No evidence of bleeding on examination   - Monitor CBC - Transfuse for hemoglobin less than 7   HTN (hypertension), benign - Continue  Coreg   DVT prophylaxis: (Lovenox/Heparin/SCD's/anticoagulated/None (if comfort care) enoxaparin (LOVENOX) injection 30 mg Start: 07/01/23 1500 DVT prophylaxis-Lovenox    Code Status: Full code Family Communication: None at bed side Disposition Plan: Possible discharge in next 1 to 2 days depending on clinical condition   Consultants:  Pulmonary  Antimicrobials: (specify start and planned stop date. Auto populated tables are space occupying and do not give end dates) Rocephin and Zithromax   Subjective: Patient feeling about same while I was in the room nurse came sharing central monitoring called out as her oxygen saturation were in 60s.  Rapid response was called.  Later it was figured out that her oxygen tube was kinked  Objective: Vitals:   07/01/23 2322 07/02/23 0225 07/02/23 0734 07/02/23 0749  BP: 107/61   (!) 111/52  Pulse: 76   70  Resp: 18   14  Temp: 97.8 F (36.6 C)   98.7 F (37.1 C)  TempSrc:      SpO2: 94% 94% 100% 100%  Weight:      Height:        Intake/Output Summary (Last 24 hours) at 07/02/2023 1415 Last data filed at 07/02/2023 1054 Gross per 24 hour  Intake 972.02 ml  Output 0 ml  Net 972.02 ml   Filed Weights   07/01/23 1430 07/01/23 1600  Weight: 50.3 kg 44.9 kg    Examination:  General exam: Appears calm and comfortable  Respiratory system: Decreased breath sounds at the bases.  Rhonchi at the right lower lung Cardiovascular system: S1 & S2 heard, RRR. No JVD, murmurs, rubs, gallops or clicks. No pedal edema. Gastrointestinal system: Abdomen is nondistended, soft and nontender. No organomegaly or masses felt. Normal bowel sounds heard. Central nervous system: Alert and oriented. No focal neurological deficits. Extremities: Symmetric 5 x 5 power. Skin: No rashes, lesions or ulcers Psychiatry: Judgement and insight appear normal. Mood & affect appropriate.     Data Reviewed: I have personally reviewed following labs and imaging  studies  CBC: Recent Labs  Lab 07/01/23 1148 07/02/23 0544  WBC 13.9* 9.3  NEUTROABS 11.2* 8.2*  HGB 9.0* 8.0*  HCT 30.7* 26.8*  MCV 87.2 86.5  PLT 279 263   Basic Metabolic Panel: Recent Labs  Lab 07/01/23 1148 07/02/23 0544  NA 136 135  K 5.0 5.3*  CL 103 105  CO2 23 26  GLUCOSE 129* 160*  BUN 33* 39*  CREATININE 1.57* 1.63*  CALCIUM 10.1 9.9    CBG: Recent Labs  Lab 07/02/23 0927  GLUCAP 135*    Sepsis Labs: Recent Labs  Lab 07/01/23 1147 07/01/23 1352  PROCALCITON  --  0.10  LATICACIDVEN 1.3 0.7    Recent Results (from the past 240 hour(s))  SARS Coronavirus 2 by RT PCR (hospital order, performed in [facility] hospital lab) *cepheid single result test* Anterior Nasal Swab     Status: None   Collection Time: 07/01/23 11:48 AM   Specimen: Anterior Nasal Swab  Result Value Ref Range Status   SARS Coronavirus 2 by RT PCR NEGATIVE NEGATIVE Final    Comment: (NOTE) SARS-CoV-2 target nucleic acids are NOT DETECTED.  The SARS-CoV-2 RNA is generally  detectable in upper and lower respiratory specimens during the acute phase of infection. The lowest concentration of SARS-CoV-2 viral copies this assay can detect is 250 copies / mL. A negative result does not preclude SARS-CoV-2 infection and should not be used as the sole basis for treatment or other patient management decisions.  A negative result may occur with improper specimen collection / handling, submission of specimen other than nasopharyngeal swab, presence of viral mutation(s) within the areas targeted by this assay, and inadequate number of viral copies (<250 copies / mL). A negative result must be combined with clinical observations, patient history, and epidemiological information.  Fact Sheet for Patients:   RoadLapTop.co.za  Fact Sheet for Healthcare Providers: http://kim-miller.com/  This test is not yet approved or  cleared by the [place] FDA and has been authorized for detection and/or diagnosis of SARS-CoV-2 by FDA under an Emergency Use Authorization (EUA).  This EUA will remain in effect (meaning this test can be used) for the duration of the COVID-19 declaration under Section 564(b)(1) of the Act, 21 U.S.C. section 360bbb-3(b)(1), unless the authorization is terminated or revoked sooner.  Performed at [facility], [address] [place], [place] [address]   Respiratory (~20 pathogens) panel by PCR     Status: None   Collection Time: 07/01/23 10:10 PM   Specimen: Nasopharyngeal Swab; Respiratory  Result Value Ref Range Status   Adenovirus NOT DETECTED NOT DETECTED Final   Coronavirus 229E NOT DETECTED NOT DETECTED Final    Comment: (NOTE) The Coronavirus on the Respiratory Panel, DOES NOT test for the novel  Coronavirus (2019 nCoV)    Coronavirus HKU1 NOT DETECTED NOT DETECTED Final   Coronavirus NL63 NOT DETECTED NOT DETECTED Final   Coronavirus OC43 NOT DETECTED NOT DETECTED Final   Metapneumovirus NOT DETECTED NOT DETECTED Final   Rhinovirus / Enterovirus NOT DETECTED NOT DETECTED Final   Influenza A NOT DETECTED NOT DETECTED Final   Influenza B NOT DETECTED NOT DETECTED Final   Parainfluenza Virus 1 NOT DETECTED NOT DETECTED Final   Parainfluenza Virus 2 NOT DETECTED NOT DETECTED Final   Parainfluenza Virus 3 NOT DETECTED NOT DETECTED Final   Parainfluenza Virus 4 NOT DETECTED NOT DETECTED Final   Respiratory Syncytial Virus NOT DETECTED NOT DETECTED Final   Bordetella pertussis NOT DETECTED NOT DETECTED Final   Bordetella Parapertussis NOT DETECTED NOT DETECTED Final   Chlamydophila pneumoniae NOT DETECTED NOT DETECTED Final   Mycoplasma pneumoniae NOT DETECTED NOT DETECTED Final    Comment: Performed at [facility], [address], [place], [place] [address]         Radiology Studies: CT CHEST WO CONTRAST  Result Date: 07/01/2023 CLINICAL DATA:  Shortness of breath.   Abnormal x-ray EXAM: CT CHEST WITHOUT CONTRAST TECHNIQUE: Multidetector CT imaging of the chest was performed following the standard protocol without IV contrast. RADIATION DOSE REDUCTION: This exam was performed according to the departmental dose-optimization program which includes automated exposure control, adjustment of the mA and/or kV according to patient size and/or use of iterative reconstruction technique. COMPARISON:  X-ray earlier 07/01/2023 and older. Chest CT scan 2014 July FINDINGS: Cardiovascular: Heart is nonenlarged. Small pericardial effusion. Coronary artery calcifications are seen. The thoracic aorta has a normal course and caliber with scattered vascular calcifications. Enlargement of the main pulmonary arteries. Please correlate for any evidence of pulmonary hypertension. Mediastinum/Nodes: Thyroid gland is unremarkable. The esophagus has a normal course and caliber and is patulous. No abnormal lymph node  enlargement identified in the axillary regions. There are several enlarged mediastinal nodes. Example to the left of the trachea on image 57 of series 2 measuring 19 x 14 mm. Pretracheal node measuring 18 x 18 mm on series 2, image 61. Several other enlarged nodes as well paratracheal, subcarinal. No left hilar nodal enlargement. Question of a right-sided enlarged node but evaluation limited without IV contrast. Lungs/Pleura: Advanced centrilobular emphysematous lung changes. In the posterior right upper lobe in the right lower lobe is some dependent parenchymal lung opacity. Possible acute infiltrate. There is a very small right-sided pleural effusion. No pneumothorax. There is also some ill-defined opacity in the lateral right upper lobe abutting the pleura on series 3 image 46. In the right lower lobe there is significant opacification of the bronchi including the bronchus intermedius as well. Areas of collapse in the dependent middle lobe as well. Upper Abdomen: Nonobstructing upper pole  right-sided renal stone. The adrenal glands are preserved. Musculoskeletal: Mild degenerative changes along the spine. IMPRESSION: Multifocal right hemithorax parenchymal opacity greatest in the right lower lobe and some in the right upper lobe. There is also significant opacification along bronchi in the right lung including the bronchus intermedius and right lower lobe bronchi. Possible debris. Recommend short follow-up to confirm resolution. Several enlarged mediastinal lymph nodes identified. Based on the lung findings these could be reactive but recommend attention on short follow-up. Severe underlying emphysematous lung changes with enlargement of the pulmonary arteries. Aortic Atherosclerosis (ICD10-I70.0) and Emphysema (ICD10-J43.9). Electronically Signed   By: Karen Kays M.D.   On: 07/01/2023 16:04   DG Chest Port 1 View  Result Date: 07/01/2023 CLINICAL DATA:  sob EXAM: PORTABLE CHEST 1 VIEW COMPARISON:  CXR 12/27/22 FINDINGS: Likely trace right pleural effusion. No pneumothorax. Redemonstrated prominent bilateral interstitial opacities with a new superimposed airspace opacity in the right upper lung. No radiographically apparent displaced rib fractures. Visualized upper abdomen is unremarkable. IMPRESSION: 1. New airspace opacity in the right upper lung, concerning for pneumonia. 2. Likely trace right pleural effusion. Electronically Signed   By: Lorenza Cambridge M.D.   On: 07/01/2023 12:42        Scheduled Meds:  azithromycin  250 mg Oral Daily   carvedilol  6.25 mg Oral BID WC   docusate sodium  100 mg Oral QODAY   enoxaparin (LOVENOX) injection  30 mg Subcutaneous Q24H   feeding supplement  237 mL Oral BID BM   ipratropium-albuterol  3 mL Nebulization Q6H   mometasone-formoterol  2 puff Inhalation BID   multivitamin with minerals  1 tablet Oral Daily   predniSONE  40 mg Oral Q breakfast   sertraline  50 mg Oral Daily   sodium chloride flush  3 mL Intravenous Q12H   Continuous  Infusions:  cefTRIAXone (ROCEPHIN)  IV 1 g (07/02/23 1321)     LOS: 0 days    Time spent: 35 minutes    Delfino Lovett, MD [facility] Pager [phone]  If 7PM-7AM, please contact night-coverage [email or web address] Password [email or web address] 07/02/2023, 2:15 PM

## 2023-07-02 NOTE — Plan of Care (Deleted)
Spoke with patient and daughter in Social worker; meeting with family tomorrow afternoon.

## 2023-07-02 NOTE — Evaluation (Signed)
Occupational Therapy Evaluation Patient Details Name: [name] MRN: [id number] DOB: [birth year] Today's Date: 07/02/2023   History of Present Illness Pt is an 82 y/o F admitted on 07/01/23 after presenting with c/o SOB & cough x 2 days. Pt is being treated for COPD with acute exacerbation, CAP PMH: end stage COPD with chronic hypoxic respiratory failure on 4L, HFpEF, DM2, HTN, HLD, CKD 3A   Clinical Impression   [name] was seen for OT evaluation this date. Prior to hospital admission, pt was MOD I with mobility and ADLs. Pt receives assistance from family as needed for task completion due to SOB. Pt lives with family in a one story home. Pt reports that the furthest she walks is 21ft within the house with RW at baseline. Pt presents to acute OT demonstrating impaired ADL performance and functional mobility 2/2 decreased activity tolerance and functional strength/ROM/balance deficits. Pt currently requires MIN A for bed mobility. MIN A for transfer with RW and verbal cueing for sequencing. Pt's SpO2 maintained between 88-94% on 4L [place]. Pt displayed decreased spatial awareness and safety using RW. Pt would benefit from skilled OT to address noted impairments and functional limitations (see below for any additional details).        Recommendations for follow up therapy are one component of a multi-disciplinary discharge planning process, led by the attending physician.  Recommendations may be updated based on patient status, additional functional criteria and insurance authorization.   Assistance Recommended at Discharge Intermittent Supervision/Assistance  Patient can return home with the following A little help with walking and/or transfers;A little help with bathing/dressing/bathroom;Assist for transportation;Help with stairs or ramp for entrance    Functional Status Assessment  Patient has had a recent decline in their functional status and demonstrates the ability to make  significant improvements in function in a reasonable and predictable amount of time.  Equipment Recommendations  BSC/3in1    Recommendations for Other Services       Precautions / Restrictions Precautions Precautions: Fall Restrictions Weight Bearing Restrictions: No      Mobility Bed Mobility Overal bed mobility: Needs Assistance Bed Mobility: Supine to Sit     Supine to sit: Min assist, HOB elevated     General bed mobility comments: MIN A for bed mobility for trunk mobility.    Transfers Overall transfer level: Needs assistance Equipment used: Rolling walker (2 wheels) Transfers: Bed to chair/wheelchair/BSC       Step pivot transfers: Min assist     General transfer comment: Carried RW during turn, and backed up 77ft to chair.      Balance Overall balance assessment: Needs assistance Sitting-balance support: No upper extremity supported, Feet supported Sitting balance-Leahy Scale: Fair     Standing balance support: Bilateral upper extremity supported Standing balance-Leahy Scale: Poor Standing balance comment: LOB while back stepping with RW.                           ADL either performed or assessed with clinical judgement   ADL Overall ADL's : Needs assistance/impaired                 Upper Body Dressing : Minimal assistance;Sitting       Toilet Transfer: Minimal assistance;Cueing for safety;Rolling walker (2 wheels);Ambulation Toilet Transfer Details (indicate cue type and reason): simulated         Functional mobility during ADLs: Minimal assistance;Rolling walker (2 wheels) General ADL Comments: Anitipate MIN A  for completion of ADL activities due to decreased activity tolerance.     Vision         Perception     Praxis      Pertinent Vitals/Pain Pain Assessment Pain Assessment: No/denies pain     Hand Dominance Right   Extremity/Trunk Assessment Upper Extremity Assessment Upper Extremity Assessment:  Generalized weakness   Lower Extremity Assessment Lower Extremity Assessment: Generalized weakness   Cervical / Trunk Assessment Cervical / Trunk Assessment: Kyphotic   Communication Communication Communication: No difficulties   Cognition Arousal/Alertness: Awake/alert Behavior During Therapy: WFL for tasks assessed/performed Overall Cognitive Status: Within Functional Limits for tasks assessed                                 General Comments: Willing to participate and engage in activity offered. Some difficulty with spatial awareness during transfer evidenced by backing up into recliner and sitting with little control.     General Comments  Pt on 4L/min via nasal cannula with lowest SPO2 of 89%, pt slightly SOB after gait with PT educating pt on pursed lip breathing    Exercises     Shoulder Instructions      Home Living Family/patient expects to be discharged to:: Private residence Living Arrangements: Children Available Help at Discharge: Family Type of Home: Mobile home Home Access:  (pt currently has steps to enter the home but family planning to move ramp so pt will have ramp access upon d/c) Entrance Stairs-Number of Steps: 1 Entrance Stairs-Rails: None Home Layout: One level     Bathroom Shower/Tub: Sponge bathes at baseline   [employer]: Standard Bathroom Accessibility: Yes How Accessible: Accessible via walker Home Equipment: Rolling Walker (2 wheels);Cane - quad;Wheelchair - manual;BSC/3in1   Additional Comments: On 4L O2 at baseline, household ambulator      Prior Functioning/Environment Prior Level of Function : Needs assist       Physical Assist : Mobility (physical);ADLs (physical)   ADLs (physical): IADLs Mobility Comments: Assistance for stair negotiation, household ambulator with RW. ADLs Comments: Pt reports recruiting family for assistance secondary to onsets of fatigue.        OT Problem List: Decreased activity  tolerance;Impaired balance (sitting and/or standing);Decreased safety awareness;Decreased knowledge of use of DME or AE      OT Treatment/Interventions: Self-care/ADL training;Therapeutic exercise;Energy conservation;DME and/or AE instruction;Patient/family education    OT Goals(Current goals can be found in the care plan section) Acute Rehab OT Goals Patient Stated Goal: To go home OT Goal Formulation: With patient/family Time For Goal Achievement: 07/16/23 Potential to Achieve Goals: Good ADL Goals Pt Will Perform Lower Body Dressing: Independently;sitting/lateral leans Pt Will Transfer to Toilet: with modified independence;ambulating Pt Will Perform Toileting - Clothing Manipulation and hygiene: with modified independence;sit to/from stand  OT Frequency: Min 1X/week    Co-evaluation              AM-PAC OT "6 Clicks" Daily Activity     Outcome Measure Help from another person eating meals?: None Help from another person taking care of personal grooming?: A Little Help from another person toileting, which includes using toliet, bedpan, or urinal?: A Little Help from another person bathing (including washing, rinsing, drying)?: A Little Help from another person to put on and taking off regular upper body clothing?: A Little Help from another person to put on and taking off regular lower body clothing?: A Little 6 Click Score:  19   End of Session Equipment Utilized During Treatment: Rolling walker (2 wheels);Oxygen  Activity Tolerance: Patient tolerated treatment well Patient left: in chair;with call bell/phone within reach;with chair alarm set;with family/visitor present  OT Visit Diagnosis: Unsteadiness on feet (R26.81);Repeated falls (R29.6);Muscle weakness (generalized) (M62.81)                Time: 1610-9604 OT Time Calculation (min): 24 min Charges:  OT General Charges $OT Visit: 1 Visit OT Evaluation $OT Eval Moderate Complexity: 1 Mod [address] OTS

## 2023-07-02 NOTE — TOC Progression Note (Signed)
Transition of Care [facility]) - Progression Note    Patient Details  Name: [name] MRN: [id number] Date of Birth: 1941/10/26  [facility] CM/SW Contact  Marlowe Sax, RN Phone Number: 07/02/2023, 2:05 PM  Clinical Narrative:    The patient comes from home where she has Oxygen that she uses at 4 liters Pt reports recruiting family for assistance secondary to onsets of fatigue  Home Equipment: Rolling Walker (2 wheels);Cane - quad;Wheelchair - manual;BSC/3in1 pt currently has steps to enter the home but family planning to move ramp so pt will have ramp access upon d/c  Lives with Son [name] Palliative on board Spoke with the patient and she is agreeable to [facility] PT I reached out to [place] with adopration and provided the referral, they can not see until next week   Expected Discharge Plan: Home/Self Care Barriers to Discharge: Continued Medical Work up  Expected Discharge Plan and Services                                                Social Determinants of Health (SDOH) Interventions SDOH Screenings   Food Insecurity: No Food Insecurity (07/01/2023)  Housing: Low Risk  (07/01/2023)  Transportation Needs: No Transportation Needs (07/01/2023)  Utilities: Not At Risk (07/01/2023)  Tobacco Use: Medium Risk (07/01/2023)    Readmission Risk Interventions     No data to display

## 2023-07-02 NOTE — Progress Notes (Signed)
   07/02/23 2034  Chest Physiotherapy Tx  $ Chest Physiotherapy (CPT) Subsequent  CPT Delivery Source Metaneb  CPT Duration 6  CPT Chest Site Full range  Post-Treatment Respirations 22  Sputum Amount None  Position Supine  CPT Treatment Tolerance Tolerated fairly well  Post-Treatment Pulse 76  Cough Upon request;Non productive    Patient attempted Metaneb and performed to best of her ability however patient was unable to perform CPT properly due to fatigue and inability to follow coaching instructions.

## 2023-07-02 NOTE — Progress Notes (Signed)
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  Pt admitted with COPD exacerbation.   Reviewed I/O's: +492 ml x 24 hours   Spoke with pt at bedside, who reports feeling better today. She shares that she consumed a "good" breakfast- a few bites of egg, a half of a muffin, and a carton of milk. Pt reports she has a fair appetite at baseline and "does not eat much". Pt shares that her husband passed away a few years ago and now lives with her son, daughter-in-law, and grandchildren, who all assist in helping care and provide meals for her. She consumes 2-3 meals per day (Breakfast: eggs and toast; Lunch: sandwich; Dinner: meat, starch, and vegetable).   Pt reports her UBW is around 135# and has steadily lost about 30 pounds over the past several years. Reviewed wt hx; pt has experienced a 6.7% wt loss over the past 3 months. While this is not significant for time frame, it is concerning given pt's advanced age, poor oral intake, and multiple co-morbidities.   Discussed importance of good meal and supplement intake to promote healing. Pt reports that she does  not think she needs supplements. RD educated pt on increased nutritional needs for COPD; pt reluctantly agreeable to supplements.   Medications reviewed and include colace and lasix.   Lab Results  Component Value Date   HGBA1C 6.3 (H) 03/20/2021   PTA DM medications are none.   Labs reviewed: K: 5.3, CBGS: 135 (inpatient orders for glycemic control are none).    NUTRITION - FOCUSED PHYSICAL EXAM:  Flowsheet Row Most Recent Value  Orbital Region Moderate depletion  Upper Arm Region Mild depletion  Thoracic and Lumbar Region No depletion  Buccal Region Mild depletion  Temple Region Moderate depletion  Clavicle Bone Region Moderate depletion  Clavicle and Acromion Bone Region Moderate depletion  Scapular Bone Region Moderate depletion  Dorsal Hand Moderate depletion  Patellar Region Moderate depletion  Anterior Thigh Region Moderate depletion  Posterior Calf Region Moderate depletion  Edema (RD Assessment) None  Hair Reviewed  Eyes Reviewed  Mouth Reviewed  Skin Reviewed  Nails Reviewed       Diet Order:   Diet Order             Diet regular Room service appropriate? Yes; Fluid consistency: Thin  Diet effective now                   EDUCATION NEEDS:   Education needs have been addressed  Skin:  Skin Assessment: Reviewed RN Assessment  Last BM:  06/30/23  Height:   Ht Readings from Last 1 Encounters:  07/01/23 5\' 2"  (1.575 m)    Weight:  Wt Readings from Last 1 Encounters:  07/01/23 44.9 kg    Ideal Body Weight:  50 kg  BMI:  Body mass index is 18.1 kg/m.  Estimated Nutritional Needs:   Kcal:  1350-1550  Protein:  60-75 grams  Fluid:  > 1.3 L    Levada Schilling, RD, LDN, CDCES Registered Dietitian II Certified Diabetes Care and Education Specialist Please refer to [facility] for RD and/or RD on-call/weekend/after hours pager

## 2023-07-02 NOTE — Plan of Care (Signed)

## 2023-07-02 NOTE — Consult Note (Signed)
PULMONOLOGY         Date: 07/02/2023,   MRN# [id number] [name] 13-Jul-1941     AdmissionWeight: 50.3 kg                 CurrentWeight: 44.9 kg  Referring provider:  Dr Sherryll Burger   CHIEF COMPLAINT:   Acute on chronic hypoxemic respiratory failure   HISTORY OF PRESENT ILLNESS   This is a patient with below PMH who is on 4L/ min O2 supplementally for chronic hypoxemia due to COPD.  She quit smoking few years ago. She has over 75 pack year smoking.  She reports acute onset of dyspnea x 1 day which was associated with increased heavy phlegm and this was discolored "light brownish" with inspissated consistency difficult to expectorate.  While in ER she had RVP and COVID testing which was negative. Her blood work was notable to elevated BNP >313.    PAST MEDICAL HISTORY   Past Medical History:  Diagnosis Date   CHF (congestive heart failure) (HCC)    COPD (chronic obstructive pulmonary disease) (HCC)    Diabetes mellitus without complication (HCC)    Environmental allergies    High blood pressure    High cholesterol      SURGICAL HISTORY   Past Surgical History:  Procedure Laterality Date   BREAST LUMPECTOMY Bilateral 1962 (L) 2002(R)   VESICOVAGINAL FISTULA CLOSURE W/ TAH  1975     FAMILY HISTORY   Family History  Problem Relation Age of Onset   Heart disease Father    Rheum arthritis Mother    Cancer Mother        breast   Heart disease Brother        2 brothers   Cancer Maternal Aunt        cervical   Cancer Maternal Aunt        stomach     SOCIAL HISTORY   Social History   Tobacco Use   Smoking status: Former    Packs/day: 1.50    Years: 45.00    Additional pack years: 0.00    Total pack years: 67.50    Types: Cigarettes    Quit date: 07/03/2013    Years since quitting: 10.0   Smokeless tobacco: Never  Vaping Use   Vaping Use: Never used  Substance Use Topics   Alcohol use: Yes    Alcohol/week: 0.0 standard drinks of alcohol     Comment: occasional glass of wine   Drug use: No     MEDICATIONS    Home Medication:    Current Medication:  Current Facility-Administered Medications:    azithromycin (ZITHROMAX) tablet 250 mg, 250 mg, Oral, Daily, Verdene Lennert, MD, 250 mg at 07/02/23 1100   carvedilol (COREG) tablet 6.25 mg, 6.25 mg, Oral, BID WC, Verdene Lennert, MD, 6.25 mg at 07/02/23 0852   cefTRIAXone (ROCEPHIN) 1 g in sodium chloride 0.9 % 100 mL IVPB, 1 g, Intravenous, Q24H, Huel Cote, Iulia, MD   docusate sodium (COLACE) capsule 100 mg, 100 mg, Oral, QODAY, Verdene Lennert, MD, 100 mg at 07/01/23 1820   enoxaparin (LOVENOX) injection 30 mg, 30 mg, Subcutaneous, Q24H, Huel Cote, Iulia, MD, 30 mg at 07/01/23 1823   feeding supplement (ENSURE ENLIVE / ENSURE PLUS) liquid 237 mL, 237 mL, Oral, BID BM, Huel Cote, Iulia, MD   ipratropium-albuterol (DUONEB) 0.5-2.5 (3) MG/3ML nebulizer solution 3 mL, 3 mL, Nebulization, Q6H, Verdene Lennert, MD, 3 mL at 07/02/23 0731   mometasone-formoterol (DULERA) 200-5  MCG/ACT inhaler 2 puff, 2 puff, Inhalation, BID, Verdene Lennert, MD, 2 puff at 07/02/23 0859   multivitamin with minerals tablet 1 tablet, 1 tablet, Oral, Daily, Delfino Lovett, MD, 1 tablet at 07/02/23 1116   predniSONE (DELTASONE) tablet 40 mg, 40 mg, Oral, Q breakfast, Verdene Lennert, MD, 40 mg at 07/02/23 0853   sertraline (ZOLOFT) tablet 50 mg, 50 mg, Oral, Daily, Verdene Lennert, MD, 50 mg at 07/02/23 1100   sodium chloride flush (NS) 0.9 % injection 3 mL, 3 mL, Intravenous, Q12H, Verdene Lennert, MD, 3 mL at 07/02/23 1100  Facility-Administered Medications Ordered in Other Encounters:    albuterol (PROVENTIL) (2.5 MG/3ML) 0.083% nebulizer solution 2.5 mg, 2.5 mg, Nebulization, Once, Simonds, Oley Balm, MD    ALLERGIES   Mucinex d [pseudoephedrine-guaifenesin er]     REVIEW OF SYSTEMS    Review of Systems:  Gen:  Denies  fever, sweats, chills weigh loss  HEENT: Denies blurred vision, double vision,  ear pain, eye pain, hearing loss, nose bleeds, sore throat Cardiac:  No dizziness, chest pain or heaviness, chest tightness,edema Resp:   reports dyspnea chronically  Gi: Denies swallowing difficulty, stomach pain, nausea or vomiting, diarrhea, constipation, bowel incontinence Gu:  Denies bladder incontinence, burning urine Ext:   Denies Joint pain, stiffness or swelling Skin: Denies  skin rash, easy bruising or bleeding or hives Endoc:  Denies polyuria, polydipsia , polyphagia or weight change Psych:   Denies depression, insomnia or hallucinations   Other:  All other systems negative   VS: BP (!) 111/52   Pulse 70   Temp 98.7 F (37.1 C)   Resp 14   Ht 5\' 2"  (1.575 m)   Wt 44.9 kg   SpO2 100%   BMI 18.10 kg/m      PHYSICAL EXAM    GENERAL:NAD, no fevers, chills, no weakness no fatigue HEAD: Normocephalic, atraumatic.  EYES: Pupils equal, round, reactive to light. Extraocular muscles intact. No scleral icterus.  MOUTH: Moist mucosal membrane. Dentition intact. No abscess noted.  EAR, NOSE, THROAT: Clear without exudates. No external lesions.  NECK: Supple. No thyromegaly. No nodules. No JVD.  PULMONARY: decreased breath sounds with mild rhonchi worse at bases bilaterally.  CARDIOVASCULAR: S1 and S2. Regular rate and rhythm. No murmurs, rubs, or gallops. No edema. Pedal pulses 2+ bilaterally.  GASTROINTESTINAL: Soft, nontender, nondistended. No masses. Positive bowel sounds. No hepatosplenomegaly.  MUSCULOSKELETAL: No swelling, clubbing, or edema. Range of motion full in all extremities.  NEUROLOGIC: Cranial nerves II through XII are intact. No gross focal neurological deficits. Sensation intact. Reflexes intact.  SKIN: No ulceration, lesions, rashes, or cyanosis. Skin warm and dry. Turgor intact.  PSYCHIATRIC: Mood, affect within normal limits. The patient is awake, alert and oriented x 3. Insight, judgment intact.       IMAGING     ASSESSMENT/PLAN   Acute on  chronic hypoxemic respiratory failure  - patient with right lower lobe infiltrate with pneumonia  - currently on 5L/min   - Currently on Rocephin /zithromax with prednione - agree with care  -patient denies aspiration     Advanced COPD with severe emphysema -continue with current nebs, prednisone -continue antibiotics -Incentive spirometry -PT/OT -BODE SCORE >7 -  5 YEAR SURVIVAL IS <20%  - Recommendation for palliative evaluation due to poor prognosis     Right lower lobe atelectasis -PT/OT, metaneb with albuterol and IS       Thank you for allowing me to participate in the care of this patient.  Patient/Family are satisfied with care plan and all questions have been answered.    Provider disclosure: Patient with at least one acute or chronic illness or injury that poses a threat to life or bodily function and is being managed actively during this encounter.  All of the below services have been performed independently by signing provider:  review of prior documentation from internal and or external health records.  Review of previous and current lab results.  Interview and comprehensive assessment during patient visit today. Review of current and previous chest radiographs/CT scans. Discussion of management and test interpretation with health care team and patient/family.   This document was prepared using Dragon voice recognition software and may include unintentional dictation errors.     Ottie Glazier, M.D.  Division of Pulmonary & Critical Care Medicine

## 2023-07-03 DIAGNOSIS — E440 Moderate protein-calorie malnutrition: Secondary | ICD-10-CM

## 2023-07-03 DIAGNOSIS — Z515 Encounter for palliative care: Secondary | ICD-10-CM

## 2023-07-03 DIAGNOSIS — J9601 Acute respiratory failure with hypoxia: Secondary | ICD-10-CM

## 2023-07-03 LAB — BASIC METABOLIC PANEL
Anion gap: 6 (ref 5–15)
BUN: 47 mg/dL — ABNORMAL HIGH (ref 8–23)
CO2: 26 mmol/L (ref 22–32)
Calcium: 10 mg/dL (ref 8.9–10.3)
Chloride: 103 mmol/L (ref 98–111)
Creatinine, Ser: 1.42 mg/dL — ABNORMAL HIGH (ref 0.44–1.00)
GFR, Estimated: 36 mL/min — ABNORMAL LOW (ref >60)
Glucose, Bld: 126 mg/dL — ABNORMAL HIGH (ref 70–99)
Potassium: 4.6 mmol/L (ref 3.5–5.1)
Sodium: 135 mmol/L (ref 135–145)

## 2023-07-03 LAB — CBC
HCT: 28.6 % — ABNORMAL LOW (ref 36.0–46.0)
Hemoglobin: 8.2 g/dL — ABNORMAL LOW (ref 12.0–15.0)
MCH: 25.4 pg — ABNORMAL LOW (ref 26.0–34.0)
MCHC: 28.7 g/dL — ABNORMAL LOW (ref 30.0–36.0)
MCV: 88.5 fL (ref 80.0–100.0)
Platelets: 323 10*3/uL (ref 150–400)
RBC: 3.23 MIL/uL — ABNORMAL LOW (ref 3.87–5.11)
RDW: 14.9 % (ref 11.5–15.5)
WBC: 14 10*3/uL — ABNORMAL HIGH (ref 4.0–10.5)
nRBC: 0 % (ref 0.0–0.2)

## 2023-07-03 MED ORDER — PREDNISONE 10 MG (21) PO TBPK: ORAL_TABLET | ORAL | 0 refills | Status: DC

## 2023-07-03 MED ORDER — MORPHINE SULFATE 10 MG/5ML PO SOLN
2.5000 mg | ORAL | Status: DC | PRN
  Filled 2023-07-03: qty 4

## 2023-07-03 MED ORDER — ENSURE ENLIVE PO LIQD: 237.0000 mL | Freq: Two times a day (BID) | ORAL | 12 refills | Status: DC

## 2023-07-03 MED ORDER — MORPHINE SULFATE 10 MG/5ML PO SOLN: 2.5000 mg | ORAL | 0 refills | Status: AC | PRN

## 2023-07-03 MED ORDER — DIAZEPAM 2 MG PO TABS: 2.0000 mg | ORAL_TABLET | Freq: Three times a day (TID) | ORAL | Status: DC | PRN

## 2023-07-03 MED ORDER — AMOXICILLIN-POT CLAVULANATE 500-125 MG PO TABS: 1.0000 | ORAL_TABLET | Freq: Two times a day (BID) | ORAL | 0 refills | Status: AC

## 2023-07-03 MED ORDER — DIAZEPAM 2 MG PO TABS: 2.0000 mg | ORAL_TABLET | Freq: Three times a day (TID) | ORAL | 0 refills | Status: DC | PRN

## 2023-07-03 NOTE — Progress Notes (Addendum)
Civil engineer, contracting [facility]) Hospital Liaison Note  Received request from Ashby Dawes, RN, Transitions of Care Manager, for hospice services at home after discharge.  Spoke with patient and daughter in law to initiate education related to hospice philosophy, services, and team approach to care.  Patient/family verbalized understanding of information given.  Per discussion, the plan is for discharge home by private vehicle once portable 02 is delivered to the patient's room today.  DME needs discussed.  Patient has the following equipment in the home:  R Walker, WC, Arvin, 3-1 BSC, Home 02 at 4l/min. Patient/family requests the following equipment in the home: Hospital Bed and over the bed table.   The address has been verified and is correct in the chart. Earldine Kainer and phone number [phone] is the family contact to arrange time of equipment delivery.    Please send signed and completed DNR home with the patient/family.  Please provide prescriptions at discharge as needed to ensure ongoing symptom management.   [facility] information and contact numbers given to patient and family.   Above information shared with Ashby Dawes, RN, Transitions of Care Manager.     Please call with any Hospice related questions or concerns.  Thank you for the opportunity to participate in this patient's care.  Redge Gainer, [facility] Liaison [phone]

## 2023-07-03 NOTE — Progress Notes (Signed)
Patient discharge was delayed because pt changed her CODE status today to DNR and wanted to go home with hospice. She is alert & oriented x 4. Her daughter-in-law arrived and Deliliah, the case manager and the hospice representative spoke with the patient after Crystal NP.  At 1619 the [employer] representatives (two males) arrived with her oxygen for home use. Pt is going home on 4 Liters of oxygen and is stable with no signs nor symptoms of distress. Both patient and family verbalize understanding all teaching and education and that all their questions were answered.  Care plan is complete and adequate for discharge. Education is complete.

## 2023-07-03 NOTE — TOC Progression Note (Signed)
Transition of Care [facility]) - Progression Note    Patient Details  Name: [name] MRN: [id number] Date of Birth: 07/18/1941  Transition of Care [facility]) CM/SW Contact  Marlowe Sax, RN Phone Number: 07/03/2023, 1:38 PM  Clinical Narrative:     Spoke with the patient and reviewed each of the agencies for Hospice and Medicare star rating They chose [facility], I notified [facility]  Expected Discharge Plan: Home w Home Health Services Barriers to Discharge: Barriers Resolved  Expected Discharge Plan and Services         Expected Discharge Date: 07/03/23                         HH Arranged: PT HH Agency: [facility] Date HH Agency Contacted: 07/03/23 Time HH Agency Contacted: 1201 Representative spoke with at [facility] Agency: Barbara Cower   Social Determinants of Health (SDOH) Interventions SDOH Screenings   Food Insecurity: No Food Insecurity (07/01/2023)  Housing: Low Risk  (07/01/2023)  Transportation Needs: No Transportation Needs (07/01/2023)  Utilities: Not At Risk (07/01/2023)  Tobacco Use: Medium Risk (07/01/2023)    Readmission Risk Interventions     No data to display

## 2023-07-03 NOTE — Plan of Care (Signed)

## 2023-07-03 NOTE — Discharge Summary (Signed)
Physician Discharge Summary   Patient: [name] MRN: [id number] DOB: 1941/03/06  Admit date:     07/01/2023  Discharge date: 07/03/23  Discharge Physician: Delfino Lovett   PCP: Lauro Regulus, MD   Recommendations at discharge:    Hospice  Discharge Diagnoses: Principal Problem:   COPD with acute exacerbation [facility]) Active Problems:   CAP (community acquired pneumonia)   Chronic respiratory failure (HCC)   CHF (congestive heart failure) (HCC)   Type 2 diabetes mellitus with stage 3 chronic kidney disease (HCC)   Anemia   HTN (hypertension), benign   Acute hypoxemic respiratory failure (HCC)   Malnutrition of moderate degree   Hospice care patient   Hospital Course: Assessment and Plan: * COPD with acute exacerbation (HCC) Patient presenting with 2 days of shortness of breath and productive cough in the setting of community-acquired pneumonia leading to COPD exacerbation. - Treated with steroids, Nebs & Abx  CAP (community acquired pneumonia) Treated with Abx  Chronic respiratory failure (HCC) Patient is currently on her home 4 L.  Chronic Diastolic CHF (congestive heart failure) (HCC) Type 2 diabetes mellitus with stage 3 chronic kidney disease (HCC) Anemia HTN (hypertension), benign  Patient and family decided for Hospice services at Home after d/w Palliative care.        Consultants: Pulmo, Palliative care Disposition: Hospice care Diet recommendation:  Discharge Diet Orders (From admission, onward)     Start     Ordered   07/03/23 0000  Diet - low sodium heart healthy        07/03/23 1048           Carb modified diet DISCHARGE MEDICATION: Allergies as of 07/03/2023       Reactions   Mucinex D [pseudoephedrine-guaifenesin Er]    Rash on neck        Medication List     STOP taking these medications    atorvastatin 20 MG tablet Commonly known as: LIPITOR   mirtazapine 7.5 MG tablet Commonly known as: REMERON   pantoprazole  40 MG tablet Commonly known as: PROTONIX   torsemide 20 MG tablet Commonly known as: DEMADEX       TAKE these medications    acetaminophen 500 MG tablet Commonly known as: TYLENOL Take 500 mg by mouth every 6 (six) hours as needed. 2 in am and 2 at night   albuterol 108 (90 Base) MCG/ACT inhaler Commonly known as: ProAir HFA Inhale 2 puffs into the lungs every 6 (six) hours as needed for wheezing or shortness of breath.   amoxicillin-clavulanate 500-125 MG tablet Commonly known as: Augmentin Take 1 tablet by mouth 2 (two) times daily for 5 days.   carvedilol 3.125 MG tablet Commonly known as: COREG Take 2 tablets (6.25 mg total) by mouth 2 (two) times daily with a meal.   cyanocobalamin 1000 MCG tablet Take 1 tablet (1,000 mcg total) by mouth daily.   diazepam 2 MG tablet Commonly known as: VALIUM Take 1 tablet (2 mg total) by mouth every 8 (eight) hours as needed for anxiety.   docusate sodium 100 MG capsule Commonly known as: COLACE Take 1 capsule (100 mg total) by mouth every other day.   feeding supplement Liqd Take 237 mLs by mouth 2 (two) times daily between meals.   ferrous sulfate 325 (65 FE) MG tablet Take 1 tablet (325 mg total) by mouth every other day.   morphine 10 MG/5ML solution Take 1.3-2.5 mLs (2.6-5 mg total) by mouth every 2 (two)  hours as needed for up to 3 days for severe pain or moderate pain (2.5mg  for moderate pain or dyspnea, 5mg  for severe pain or dyspnea).   potassium chloride 10 MEQ CR capsule Commonly known as: MICRO-K Take 1 capsule (10 mEq total) by mouth daily as needed (take potasium if you take torsemide.).   predniSONE 10 MG (21) Tbpk tablet Commonly known as: STERAPRED UNI-PAK 21 TAB Start 60 mg po daily, taper 10 mg daily until finish   sertraline 50 MG tablet Commonly known as: ZOLOFT Take by mouth.   Spiriva Respimat 2.5 MCG/ACT Aers Generic drug: Tiotropium Bromide Monohydrate INHALE 2 PUFFS INTO THE LUNGS DAILY    Wixela Inhub 250-50 MCG/ACT Aepb Generic drug: fluticasone-salmeterol INHALE 1 PUFF INTO THE LUNGS TWICE DAILY        Follow-up Information     Lauro Regulus, MD. Go on 07/09/2023.   Specialty: Internal Medicine Why: Tuesday, 7/9 at 11:30 a.m. [facility] Discharge F/UP Contact information: [address] [facility] [place] [address] [phone]         Vida Rigger, MD. Schedule an appointment as soon as possible for a visit in 2 week(s).   Specialty: Pulmonary Disease Why: will need a referral from PCP - please discuss with Dr. Dareen Piano when you see him next week  [facility] Discharge F/UP Contact information: [address] [place] [address] [phone]                Discharge Exam: Ceasar Mons Weights   07/01/23 1430 07/01/23 1600  Weight: 50.3 kg 44.9 kg   General exam: Appears calm and comfortable  Respiratory system: Decreased breath sounds at the bases.  Rhonchi at the right lower lung Cardiovascular system: S1 & S2 heard, RRR. No JVD, murmurs, rubs, gallops or clicks. No pedal edema. Gastrointestinal system: Abdomen is nondistended, soft and nontender. No organomegaly or masses felt. Normal bowel sounds heard. Central nervous system: Alert and oriented. No focal neurological deficits. Extremities: Symmetric 5 x 5 power. Skin: No rashes, lesions or ulcers Psychiatry: Judgement and insight appear normal. Mood & affect appropriate.   Condition at discharge: fair  The results of significant diagnostics from this hospitalization (including imaging, microbiology, ancillary and laboratory) are listed below for reference.   Imaging Studies: CT CHEST WO CONTRAST  Result Date: 07/01/2023 CLINICAL DATA:  Shortness of breath.  Abnormal x-ray EXAM: CT CHEST WITHOUT CONTRAST TECHNIQUE: Multidetector CT imaging of the chest was performed following the standard protocol without IV contrast. RADIATION DOSE REDUCTION:  This exam was performed according to the departmental dose-optimization program which includes automated exposure control, adjustment of the mA and/or kV according to patient size and/or use of iterative reconstruction technique. COMPARISON:  X-ray earlier 07/01/2023 and older. Chest CT scan 2014 July FINDINGS: Cardiovascular: Heart is nonenlarged. Small pericardial effusion. Coronary artery calcifications are seen. The thoracic aorta has a normal course and caliber with scattered vascular calcifications. Enlargement of the main pulmonary arteries. Please correlate for any evidence of pulmonary hypertension. Mediastinum/Nodes: Thyroid gland is unremarkable. The esophagus has a normal course and caliber and is patulous. No abnormal lymph node enlargement identified in the axillary regions. There are several enlarged mediastinal nodes. Example to the left of the trachea on image 57 of series 2 measuring 19 x 14 mm. Pretracheal node measuring 18 x 18 mm on series 2, image 61. Several other enlarged nodes as well paratracheal, subcarinal. No left hilar nodal enlargement. Question of a right-sided  enlarged node but evaluation limited without IV contrast. Lungs/Pleura: Advanced centrilobular emphysematous lung changes. In the posterior right upper lobe in the right lower lobe is some dependent parenchymal lung opacity. Possible acute infiltrate. There is a very small right-sided pleural effusion. No pneumothorax. There is also some ill-defined opacity in the lateral right upper lobe abutting the pleura on series 3 image 46. In the right lower lobe there is significant opacification of the bronchi including the bronchus intermedius as well. Areas of collapse in the dependent middle lobe as well. Upper Abdomen: Nonobstructing upper pole right-sided renal stone. The adrenal glands are preserved. Musculoskeletal: Mild degenerative changes along the spine. IMPRESSION: Multifocal right hemithorax parenchymal opacity greatest in  the right lower lobe and some in the right upper lobe. There is also significant opacification along bronchi in the right lung including the bronchus intermedius and right lower lobe bronchi. Possible debris. Recommend short follow-up to confirm resolution. Several enlarged mediastinal lymph nodes identified. Based on the lung findings these could be reactive but recommend attention on short follow-up. Severe underlying emphysematous lung changes with enlargement of the pulmonary arteries. Aortic Atherosclerosis (ICD10-I70.0) and Emphysema (ICD10-J43.9). Electronically Signed   By: Karen Kays M.D.   On: 07/01/2023 16:04   DG Chest Port 1 View  Result Date: 07/01/2023 CLINICAL DATA:  sob EXAM: PORTABLE CHEST 1 VIEW COMPARISON:  CXR 12/27/22 FINDINGS: Likely trace right pleural effusion. No pneumothorax. Redemonstrated prominent bilateral interstitial opacities with a new superimposed airspace opacity in the right upper lung. No radiographically apparent displaced rib fractures. Visualized upper abdomen is unremarkable. IMPRESSION: 1. New airspace opacity in the right upper lung, concerning for pneumonia. 2. Likely trace right pleural effusion. Electronically Signed   By: Lorenza Cambridge M.D.   On: 07/01/2023 12:42    Microbiology: Results for orders placed or performed during the hospital encounter of 07/01/23  SARS Coronavirus 2 by RT PCR (hospital order, performed in [facility] hospital lab) *cepheid single result test* Anterior Nasal Swab     Status: None   Collection Time: 07/01/23 11:48 AM   Specimen: Anterior Nasal Swab  Result Value Ref Range Status   SARS Coronavirus 2 by RT PCR NEGATIVE NEGATIVE Final    Comment: (NOTE) SARS-CoV-2 target nucleic acids are NOT DETECTED.  The SARS-CoV-2 RNA is generally detectable in upper and lower respiratory specimens during the acute phase of infection. The lowest concentration of SARS-CoV-2 viral copies this assay can detect is 250 copies / mL. A  negative result does not preclude SARS-CoV-2 infection and should not be used as the sole basis for treatment or other patient management decisions.  A negative result may occur with improper specimen collection / handling, submission of specimen other than nasopharyngeal swab, presence of viral mutation(s) within the areas targeted by this assay, and inadequate number of viral copies (<250 copies / mL). A negative result must be combined with clinical observations, patient history, and epidemiological information.  Fact Sheet for Patients:   RoadLapTop.co.za  Fact Sheet for Healthcare Providers: http://kim-miller.com/  This test is not yet approved or  cleared by the [place] FDA and has been authorized for detection and/or diagnosis of SARS-CoV-2 by FDA under an Emergency Use Authorization (EUA).  This EUA will remain in effect (meaning this test can be used) for the duration of the COVID-19 declaration under Section 564(b)(1) of the Act, 21 U.S.C. section 360bbb-3(b)(1), unless the authorization is terminated or revoked sooner.  Performed at [facility], [address]  [place], [place] [address]   Respiratory (~20 pathogens) panel by PCR     Status: None   Collection Time: 07/01/23 10:10 PM   Specimen: Nasopharyngeal Swab; Respiratory  Result Value Ref Range Status   Adenovirus NOT DETECTED NOT DETECTED Final   Coronavirus 229E NOT DETECTED NOT DETECTED Final    Comment: (NOTE) The Coronavirus on the Respiratory Panel, DOES NOT test for the novel  Coronavirus (2019 nCoV)    Coronavirus HKU1 NOT DETECTED NOT DETECTED Final   Coronavirus NL63 NOT DETECTED NOT DETECTED Final   Coronavirus OC43 NOT DETECTED NOT DETECTED Final   Metapneumovirus NOT DETECTED NOT DETECTED Final   Rhinovirus / Enterovirus NOT DETECTED NOT DETECTED Final   Influenza A NOT DETECTED NOT DETECTED Final   Influenza B NOT DETECTED NOT  DETECTED Final   Parainfluenza Virus 1 NOT DETECTED NOT DETECTED Final   Parainfluenza Virus 2 NOT DETECTED NOT DETECTED Final   Parainfluenza Virus 3 NOT DETECTED NOT DETECTED Final   Parainfluenza Virus 4 NOT DETECTED NOT DETECTED Final   Respiratory Syncytial Virus NOT DETECTED NOT DETECTED Final   Bordetella pertussis NOT DETECTED NOT DETECTED Final   Bordetella Parapertussis NOT DETECTED NOT DETECTED Final   Chlamydophila pneumoniae NOT DETECTED NOT DETECTED Final   Mycoplasma pneumoniae NOT DETECTED NOT DETECTED Final    Comment: Performed at [facility], [address] [place], [place] [address]    Labs: CBC: Recent Labs  Lab 07/01/23 1148 07/02/23 0544 07/03/23 0411  WBC 13.9* 9.3 14.0*  NEUTROABS 11.2* 8.2*  --   HGB 9.0* 8.0* 8.2*  HCT 30.7* 26.8* 28.6*  MCV 87.2 86.5 88.5  PLT 279 263 323   Basic Metabolic Panel: Recent Labs  Lab 07/01/23 1148 07/02/23 0544 07/03/23 0411  NA 136 135 135  K 5.0 5.3* 4.6  CL 103 105 103  CO2 23 26 26   GLUCOSE 129* 160* 126*  BUN 33* 39* 47*  CREATININE 1.57* 1.63* 1.42*  CALCIUM 10.1 9.9 10.0   Liver Function Tests: No results for input(s): "AST", "ALT", "ALKPHOS", "BILITOT", "PROT", "ALBUMIN" in the last 168 hours. CBG: Recent Labs  Lab 07/02/23 0927  GLUCAP 135*    Discharge time spent: greater than 30 minutes.  Signed: Delfino Lovett, MD [facility] 07/03/2023

## 2023-07-03 NOTE — Progress Notes (Addendum)
Daily Progress Note   Patient Name: [name]       Date: 07/03/2023 DOB: April 30, 1941  Age: 82 y.o. MRN#: [id number] Attending Physician: Delfino Lovett, MD Primary Care Physician: Lauro Regulus, MD Admit Date: 07/01/2023  Reason for Consultation/Follow-up: Establishing goals of care  Subjective: Notes and labs reviewed.  In to see patient.  Patient is currently sitting in bed eating lunch.  Her daughter-in-law with whom [name] lives is at bedside.  Patient states [name] was able to speak with her son [name], and [name] is at peace with transitioning to home with hospice care.  [name] discusses wanting to be at home with her family and be comfortable for what time that [name] has left on this earth.  I completed a MOST form today and the signed original was placed in the chart. The form was scanned and sent to medical records for it to be uploaded under ACP tab in Epic. A photocopy was also placed in the chart to be scanned into EMR. The patient outlined their wishes for the following treatment decisions:  Cardiopulmonary Resuscitation: Do Not Attempt Resuscitation (DNR/No CPR)  Medical Interventions: Comfort Measures: Keep clean, warm, and dry. Use medication by any route, positioning, wound care, and other measures to relieve pain and suffering. Use oxygen, suction and manual treatment of airway obstruction as needed for comfort. Do not transfer to the hospital unless comfort needs cannot be met in current location.  Antibiotics: Determine use of limitation of antibiotics when infection occurs  IV Fluids: No IV fluids (provide other measures to ensure comfort)  Feeding Tube: No feeding tube     Length of Stay: 1  Current Medications: Scheduled Meds:   azithromycin  250 mg Oral Daily   carvedilol   6.25 mg Oral BID WC   docusate sodium  100 mg Oral QODAY   enoxaparin (LOVENOX) injection  30 mg Subcutaneous Q24H   feeding supplement  237 mL Oral BID BM   ipratropium-albuterol  3 mL Nebulization Q6H   mometasone-formoterol  2 puff Inhalation BID   multivitamin with minerals  1 tablet Oral Daily   predniSONE  40 mg Oral Q breakfast   sertraline  50 mg Oral Daily   sodium chloride flush  3 mL Intravenous Q12H    Continuous Infusions:  cefTRIAXone (ROCEPHIN)  IV Stopped (07/02/23 1416)    PRN Meds:   Physical Exam Pulmonary:     Effort: Pulmonary effort is normal.  Neurological:     Mental Status: [name] is alert.             Vital Signs: BP (!) 131/59 (BP Location: Left Arm)   Pulse 77   Temp (!) 97.5 F (36.4 C)   Resp 18   Ht 5\' 2"  (1.575 m)   Wt 44.9 kg   SpO2 90%   BMI 18.10 kg/m  SpO2: SpO2: 90 % O2 Device: O2 Device: Nasal Cannula O2 Flow Rate: O2 Flow Rate (L/min): 4 L/min  Intake/output summary:  Intake/Output Summary (Last 24 hours) at 07/03/2023 1400 Last data filed at 07/03/2023 1357 Gross per 24 hour  Intake 584.85 ml  Output 1200 ml  Net -615.15 ml   LBM: Last BM Date : 06/30/23 Baseline Weight: Weight: 50.3 kg Most recent weight: Weight: 44.9 kg        Patient Active Problem List   Diagnosis Date Noted   Malnutrition of moderate degree 07/03/2023   Acute hypoxemic respiratory failure (HCC) 07/02/2023   CAP (community acquired pneumonia) 07/01/2023   COPD with acute exacerbation (HCC) 07/01/2023   Anemia 03/21/2021   Symptomatic anemia 03/20/2021   AKI (acute kidney injury) (HCC) 03/20/2021   Protein-calorie malnutrition, severe 12/16/2020   CHF (congestive heart failure) (HCC) 12/14/2020   Pain due to onychomycosis of toenails of both feet 06/06/2020   Subclinical hypothyroidism 01/09/2019   Onychomycosis 09/20/2016   Health care maintenance 06/19/2016   Back pain 05/12/2015   Fatigue 10/13/2014   Major depression in remission (HCC)  10/10/2014   Type 2 diabetes mellitus with stage 3 chronic kidney disease (HCC) 06/08/2014   Edema 05/29/2014   HTN (hypertension), benign 05/29/2014   Hyperlipidemia associated with type 2 diabetes mellitus (HCC) 05/29/2014   UI (urinary incontinence) 05/29/2014   Dyspnea 04/26/2014   COPD (chronic obstructive pulmonary disease) (HCC) 03/25/2014   Chronic respiratory failure (HCC) 03/25/2014    Palliative Care Assessment & Plan     Recommendations/Plan: Patient would like to go home with hospice. As needed liquid morphine ordered for pain and shortness of breath, please see order for details. As needed diazepam ordered for anxiety, please see order for details.   Code Status:    Code Status Orders  (From admission, onward)           Start     Ordered   07/01/23 1446  Full code  Continuous       Question:  By:  Answer:  Consent: discussion documented in EHR   07/01/23 1447           Code Status History     Date Active Date Inactive Code Status Order ID Comments User Context   03/20/2021 1451 03/22/2021 1912 Full Code [id number]  Lucile Shutters, MD ED   12/14/2020 1849 12/17/2020 2013 Full Code [id number]  Hugelmeyer, Jon Gills, DO ED      Advance Directive Documentation    Flowsheet Row Most Recent Value  Type of Advance Directive Healthcare Power of Attorney, Living will  Pre-existing out of facility DNR order (yellow form or pink MOST form) --  "MOST" Form in Place? --       Prognosis:  < 6 months   Care plan was discussed with primary MD, palliative liaison, TOC via epic chat.  Thank you for allowing the Palliative Medicine Team to assist in the care of  this patient.   Morton Stall, NP  Please contact Palliative Medicine Team phone at [phone] for questions and concerns.

## 2023-07-03 NOTE — Progress Notes (Signed)
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  She quit smoking few years ago. She has over 75 pack year smoking.  She reports acute onset of dyspnea x 1 day which was associated with increased heavy phlegm and this was discolored "light brownish" with inspissated consistency difficult to expectorate.  While in ER she had RVP and COVID testing which was negative. Her blood work was notable to elevated BNP >313.    Patient is cleared for DC home.   PAST MEDICAL HISTORY   Past Medical History:  Diagnosis Date   CHF (congestive heart failure) (HCC)    COPD (chronic obstructive pulmonary disease) (HCC)    Diabetes mellitus without complication (HCC)    Environmental allergies    High blood pressure    High cholesterol      SURGICAL HISTORY   Past Surgical History:  Procedure Laterality Date   BREAST LUMPECTOMY Bilateral 1962 (L) 2002(R)   VESICOVAGINAL FISTULA CLOSURE W/ TAH  1975     FAMILY HISTORY   Family History  Problem Relation Age of Onset   Heart disease Father    Rheum arthritis Mother    Cancer Mother        breast   Heart disease Brother        2 brothers   Cancer Maternal Aunt        cervical   Cancer Maternal Aunt        stomach     SOCIAL HISTORY   Social History   Tobacco Use   Smoking status: Former    Packs/day: 1.50    Years: 45.00    Additional pack years: 0.00    Total pack years: 67.50    Types: Cigarettes    Quit date: 07/03/2013    Years since quitting: 10.0   Smokeless tobacco: Never  Vaping Use   Vaping Use: Never used  Substance Use Topics   Alcohol use: Yes     Alcohol/week: 0.0 standard drinks of alcohol    Comment: occasional glass of wine   Drug use: No     MEDICATIONS    Home Medication:  Current Outpatient Rx   Order #: [id number] Class: Normal   Order #: [id number] Class: Normal   Order #: [id number] Class: Normal    Current Medication:  Current Facility-Administered Medications:    azithromycin (ZITHROMAX) tablet 250 mg, 250 mg, Oral, Daily, Verdene Lennert, MD, 250 mg at 07/03/23 0907   carvedilol (COREG) tablet 6.25 mg, 6.25 mg, Oral, BID WC, Verdene Lennert, MD, 6.25 mg at 07/03/23 0757   cefTRIAXone (ROCEPHIN) 1 g in sodium chloride 0.9 % 100 mL IVPB, 1 g, Intravenous, Q24H, Verdene Lennert, MD, Stopped at 07/02/23 1416   docusate sodium (COLACE) capsule 100 mg, 100 mg, Oral, QODAY, Verdene Lennert, MD, 100 mg at 07/03/23 0903   enoxaparin (LOVENOX) injection 30 mg, 30 mg, Subcutaneous, Q24H, Verdene Lennert, MD, 30 mg at 07/02/23 1704   feeding supplement (ENSURE ENLIVE / ENSURE PLUS) liquid 237 mL, 237 mL,  Oral, BID BM, Verdene Lennert, MD   ipratropium-albuterol (DUONEB) 0.5-2.5 (3) MG/3ML nebulizer solution 3 mL, 3 mL, Nebulization, Q6H, Verdene Lennert, MD, 3 mL at 07/03/23 0820   mometasone-formoterol (DULERA) 200-5 MCG/ACT inhaler 2 puff, 2 puff, Inhalation, BID, Verdene Lennert, MD, 2 puff at 07/03/23 1610   multivitamin with minerals tablet 1 tablet, 1 tablet, Oral, Daily, Delfino Lovett, MD, 1 tablet at 07/03/23 9604   predniSONE (DELTASONE) tablet 40 mg, 40 mg, Oral, Q breakfast, Verdene Lennert, MD, 40 mg at 07/03/23 0757   sertraline (ZOLOFT) tablet 50 mg, 50 mg, Oral, Daily, Verdene Lennert, MD, 50 mg at 07/03/23 5409   sodium chloride flush (NS) 0.9 % injection 3 mL, 3 mL, Intravenous, Q12H, Verdene Lennert, MD, 3 mL at 07/03/23 0908  Facility-Administered Medications Ordered in Other Encounters:    albuterol (PROVENTIL) (2.5 MG/3ML) 0.083% nebulizer solution 2.5 mg, 2.5 mg, Nebulization, Once, Merwyn Katos,  MD    ALLERGIES   Mucinex d [pseudoephedrine-guaifenesin er]     REVIEW OF SYSTEMS    Review of Systems:  Gen:  Denies  fever, sweats, chills weigh loss  HEENT: Denies blurred vision, double vision, ear pain, eye pain, hearing loss, nose bleeds, sore throat Cardiac:  No dizziness, chest pain or heaviness, chest tightness,edema Resp:   reports dyspnea chronically  Gi: Denies swallowing difficulty, stomach pain, nausea or vomiting, diarrhea, constipation, bowel incontinence Gu:  Denies bladder incontinence, burning urine Ext:   Denies Joint pain, stiffness or swelling Skin: Denies  skin rash, easy bruising or bleeding or hives Endoc:  Denies polyuria, polydipsia , polyphagia or weight change Psych:   Denies depression, insomnia or hallucinations   Other:  All other systems negative   VS: BP (!) 131/59 (BP Location: Left Arm)   Pulse 77   Temp (!) 97.5 F (36.4 C)   Resp 18   Ht 5\' 2"  (1.575 m)   Wt 44.9 kg   SpO2 90%   BMI 18.10 kg/m      PHYSICAL EXAM    GENERAL:NAD, no fevers, chills, no weakness no fatigue HEAD: Normocephalic, atraumatic.  EYES: Pupils equal, round, reactive to light. Extraocular muscles intact. No scleral icterus.  MOUTH: Moist mucosal membrane. Dentition intact. No abscess noted.  EAR, NOSE, THROAT: Clear without exudates. No external lesions.  NECK: Supple. No thyromegaly. No nodules. No JVD.  PULMONARY: decreased breath sounds with mild rhonchi worse at bases bilaterally.  CARDIOVASCULAR: S1 and S2. Regular rate and rhythm. No murmurs, rubs, or gallops. No edema. Pedal pulses 2+ bilaterally.  GASTROINTESTINAL: Soft, nontender, nondistended. No masses. Positive bowel sounds. No hepatosplenomegaly.  MUSCULOSKELETAL: No swelling, clubbing, or edema. Range of motion full in all extremities.  NEUROLOGIC: Cranial nerves II through XII are intact. No gross focal neurological deficits. Sensation intact. Reflexes intact.  SKIN: No ulceration,  lesions, rashes, or cyanosis. Skin warm and dry. Turgor intact.  PSYCHIATRIC: Mood, affect within normal limits. The patient is awake, alert and oriented x 3. Insight, judgment intact.       IMAGING     ASSESSMENT/PLAN   Acute on chronic hypoxemic respiratory failure  - patient with right lower lobe infiltrate with pneumonia  - currently on 3L/min [place]  - Currently on Rocephin /zithromax with prednione - agree with care- dc home with abx and pred taper  -patient denies aspiration     Advanced COPD with severe emphysema -continue with current nebs, prednisone -continue antibiotics -Incentive spirometry -PT/OT -BODE SCORE >7 -  5 YEAR SURVIVAL  IS <20%  - Recommendation for palliative evaluation due to poor prognosis     Right lower lobe atelectasis -PT/OT, metaneb with albuterol and IS       Thank you for allowing me to participate in the care of this patient.   Patient/Family are satisfied with care plan and all questions have been answered.    Provider disclosure: Patient with at least one acute or chronic illness or injury that poses a threat to life or bodily function and is being managed actively during this encounter.  All of the below services have been performed independently by signing provider:  review of prior documentation from internal and or external health records.  Review of previous and current lab results.  Interview and comprehensive assessment during patient visit today. Review of current and previous chest radiographs/CT scans. Discussion of management and test interpretation with health care team and patient/family.   This document was prepared using Dragon voice recognition software and may include unintentional dictation errors.     Vida Rigger, M.D.  Division of Pulmonary & Critical Care Medicine

## 2023-07-05 DIAGNOSIS — J449 Chronic obstructive pulmonary disease, unspecified: Secondary | ICD-10-CM | POA: Diagnosis not present

## 2023-07-05 DIAGNOSIS — Z7951 Long term (current) use of inhaled steroids: Secondary | ICD-10-CM | POA: Diagnosis not present

## 2023-07-05 DIAGNOSIS — Z515 Encounter for palliative care: Secondary | ICD-10-CM | POA: Diagnosis not present

## 2023-07-05 DIAGNOSIS — Z9981 Dependence on supplemental oxygen: Secondary | ICD-10-CM | POA: Diagnosis not present

## 2023-07-05 DIAGNOSIS — J9610 Chronic respiratory failure, unspecified whether with hypoxia or hypercapnia: Secondary | ICD-10-CM | POA: Diagnosis not present

## 2023-07-09 LAB — LEGIONELLA PNEUMOPHILA SEROGP 1 UR AG: L. pneumophila Serogp 1 Ur Ag: NEGATIVE

## 2023-08-12 ENCOUNTER — Other Ambulatory Visit: Payer: Self-pay

## 2023-08-12 ENCOUNTER — Emergency Department
Admission: EM | Admit: 2023-08-12 | Discharge: 2023-09-01 | Disposition: E | Attending: Emergency Medicine | Admitting: Emergency Medicine

## 2023-08-12 DIAGNOSIS — R0902 Hypoxemia: Secondary | ICD-10-CM | POA: Diagnosis not present

## 2023-08-12 DIAGNOSIS — R0603 Acute respiratory distress: Secondary | ICD-10-CM | POA: Diagnosis not present

## 2023-08-12 DIAGNOSIS — J449 Chronic obstructive pulmonary disease, unspecified: Secondary | ICD-10-CM | POA: Diagnosis not present

## 2023-08-12 DIAGNOSIS — Z66 Do not resuscitate: Secondary | ICD-10-CM | POA: Diagnosis not present

## 2023-08-12 DIAGNOSIS — I959 Hypotension, unspecified: Secondary | ICD-10-CM | POA: Diagnosis not present

## 2023-08-12 DIAGNOSIS — R404 Transient alteration of awareness: Secondary | ICD-10-CM | POA: Diagnosis not present

## 2023-08-12 DIAGNOSIS — I469 Cardiac arrest, cause unspecified: Secondary | ICD-10-CM | POA: Insufficient documentation

## 2023-08-12 DIAGNOSIS — R0689 Other abnormalities of breathing: Secondary | ICD-10-CM | POA: Diagnosis not present

## 2023-08-12 DIAGNOSIS — R001 Bradycardia, unspecified: Secondary | ICD-10-CM | POA: Diagnosis not present

## 2023-09-01 NOTE — ED Triage Notes (Signed)
Pt BIB [facility] d/t respiratory distress.  Pt is cared for by Hospice and DNR / only comfort care.  Pt was 70% O2 on 7L at home - after 15L placement by EMS she was 80% O2 with rales/rhonchi. She was given 2.5 mg of Morphine at home unknown route.    After 2 Duonebs given by EMS she is 94% on 15L.  Family stated Hospice was called but they said to go to ER.

## 2023-09-01 NOTE — ED Notes (Signed)
Pt comforted during  last breaths

## 2023-09-01 NOTE — ED Notes (Signed)
Pt placed in body bag at this time.

## 2023-09-01 NOTE — ED Notes (Signed)
Family arrived - Chaplain with them

## 2023-09-01 NOTE — Progress Notes (Signed)
   08/04/2023 2000  Spiritual Encounters  Type of Visit Initial  Care provided to: Family  Referral source Nurse (RN/NT/LPN)  Reason for visit Patient death  OnCall Visit Yes   Chaplain responded to patient death and accompanied family to bedside and assisted through grieving process.

## 2023-09-01 NOTE — ED Notes (Signed)
Family escorted out of ED with chaplain. Post mortem care completed by Gus Height, EDT's. All lines, drains and tubes removed. Pts clothing removed and peri care provided with new brief in place for potential drainage. Pt placed into transport bag with labeled tag placed on right great toe and out side zipper of transport bag. Transport services called to transport pt to morgue. No belongings left with pt other than her dentures that are to stay in place, by request of family.

## 2023-09-01 NOTE — ED Notes (Addendum)
[facility] called and Clinical research associate updated, Burna Mortimer, RN of pts time of death in ED.

## 2023-09-01 NOTE — ED Provider Notes (Signed)
[facility] Provider Note   Event Date/Time   First MD Initiated Contact with Patient 08/30/2023 1916     (approximate) History  Respiratory Distress  HPI [name] is a 82 y.o. female on hospice for COPD who presents for worsening respiratory status.  In discussion with patient's hospice nurse, family was concerned as patient was having more difficulty breathing despite administering hospice dose of morphine.  Hospice center offered to send a hospice home nurse however patient's family called 911 prior to their arrival and patient was transported to the emergency department.  Family continues to express that she needs to be a DNR as well as has a MOST form for no interventions with antibiotics or fluids.  Family states that they do want to continue this level of care. ROS: Unable to assess   Physical Exam  Triage Vital Signs: ED Triage Vitals  Encounter Vitals Group     BP 08/26/2023 1915 (!) 180/135     Systolic BP Percentile --      Diastolic BP Percentile --      Pulse Rate 08/05/2023 1915 87     Resp 08/11/2023 1927 18     Temp 08/22/2023 1927 (!) 96.7 F (35.9 C)     Temp Source 08/24/2023 1927 Rectal     SpO2 08/26/2023 1915 92 %     Weight 08/05/2023 1926 98 lb 15.8 oz (44.9 kg)     Height 08/19/2023 1926 5\' 2"  (1.575 m)     Head Circumference --      Peak Flow --      Pain Score --      Pain Loc --      Pain Education --      Exclude from Growth Chart --    Most recent vital signs: Vitals:   08/27/2023 2005 08/07/2023 2006  BP:    Pulse: (!) 39 (!) 36  Resp:    Temp:    SpO2: (!) 51% (!) 36%   General: GCS 3, agonal respirations CV:  Decreased peripheral perfusion Resp:  Agonal respirations Abd:  No distention.  Other:  Elderly cachectic Caucasian female laying in bed with agonal respirations, cool mottled skin, and unresponsive ED Results / Procedures / Treatments  Labs (all labs ordered are listed, but only abnormal results are displayed) Labs  Reviewed - No data to display PROCEDURES: Critical Care performed: No Procedures MEDICATIONS ORDERED IN ED: Medications - No data to display IMPRESSION / MDM / ASSESSMENT AND PLAN / ED COURSE  I reviewed the triage vital signs and the nursing notes.                             The patient is on the cardiac monitor to evaluate for evidence of arrhythmia and/or significant heart rate changes. Patient's presentation is most consistent with acute presentation with potential threat to life or bodily function. Patient is an 82 year old female who presents from hospice care via EMS after patient had increasing respiratory distress.  During transport patient received DuoNeb treatments and increased oxygenation which did improve her oxygenation however did not improve her mental status.  Patient had agonal respirations on arrival with continually decreasing blood pressure despite 15 L oxygen nonrebreather.  I spoke to patient's family who wished to continue the current level of care.  Soon after this discussion, patient passed away with time of death at 71.  Family was informed prior to their  arrival and arrangements were made via administration.  Dispo: Deceased   FINAL CLINICAL IMPRESSION(S) / ED DIAGNOSES   Final diagnoses:  Respiratory distress  Cardiac arrest (HCC)   Rx / DC Orders   ED Discharge Orders     None      Note:  This document was prepared using Dragon voice recognition software and may include unintentional dictation errors.   Merwyn Katos, MD 08/19/2023 [phone]

## 2023-09-01 NOTE — ED Notes (Addendum)
Dr Vicente Males at Bedside - time of Death 2014-08-23

## 2023-09-01 NOTE — ED Notes (Signed)
Pt not taking any breaths at this time - Dr Vicente Males paged.  Family on the way

## 2023-09-01 DEATH — deceased
